# Patient Record
Sex: Male | Born: 1955 | Race: Black or African American | Hispanic: No | Smoking: Current every day smoker
Health system: Southern US, Community
[De-identification: ages and names within clinical notes are randomized; demographics above are authoritative.]

## PROBLEM LIST (undated history)

## (undated) DIAGNOSIS — F1910 Other psychoactive substance abuse, uncomplicated: Secondary | ICD-10-CM

## (undated) DIAGNOSIS — N400 Enlarged prostate without lower urinary tract symptoms: Secondary | ICD-10-CM

## (undated) DIAGNOSIS — I209 Angina pectoris, unspecified: Secondary | ICD-10-CM

## (undated) DIAGNOSIS — F209 Schizophrenia, unspecified: Secondary | ICD-10-CM

## (undated) DIAGNOSIS — R718 Other abnormality of red blood cells: Secondary | ICD-10-CM

## (undated) DIAGNOSIS — E119 Type 2 diabetes mellitus without complications: Secondary | ICD-10-CM

## (undated) DIAGNOSIS — Z7251 High risk heterosexual behavior: Secondary | ICD-10-CM

## (undated) DIAGNOSIS — I10 Essential (primary) hypertension: Secondary | ICD-10-CM

## (undated) DIAGNOSIS — I2510 Atherosclerotic heart disease of native coronary artery without angina pectoris: Secondary | ICD-10-CM

## (undated) HISTORY — DX: High risk heterosexual behavior: Z72.51

## (undated) HISTORY — PX: CIRCUMCISION: SUR203

## (undated) HISTORY — DX: Essential (primary) hypertension: I10

## (undated) HISTORY — PX: ARTHROSCOPY WITH ANTERIOR CRUCIATE LIGAMENT (ACL) REPAIR WITH ANTERIOR TIBILIAS GRAFT: SHX6503

## (undated) HISTORY — PX: PROSTATE SURGERY: SHX751

## (undated) HISTORY — DX: Other psychoactive substance abuse, uncomplicated: F19.10

## (undated) HISTORY — PX: LAMINECTOMY: SHX219

## (undated) HISTORY — DX: Other abnormality of red blood cells: R71.8

## (undated) HISTORY — DX: Schizophrenia, unspecified: F20.9

---

## 1997-10-21 ENCOUNTER — Encounter: Admission: RE | Admit: 1997-10-21 | Discharge: 1997-10-21 | Payer: Self-pay | Admitting: Internal Medicine

## 1997-10-23 ENCOUNTER — Emergency Department (HOSPITAL_COMMUNITY): Admission: EM | Admit: 1997-10-23 | Discharge: 1997-10-23 | Payer: Self-pay | Admitting: Emergency Medicine

## 1997-11-24 ENCOUNTER — Encounter: Admission: RE | Admit: 1997-11-24 | Discharge: 1997-11-24 | Payer: Self-pay | Admitting: Internal Medicine

## 1998-01-18 ENCOUNTER — Encounter: Admission: RE | Admit: 1998-01-18 | Discharge: 1998-01-18 | Payer: Self-pay | Admitting: Internal Medicine

## 1998-02-22 ENCOUNTER — Encounter: Admission: RE | Admit: 1998-02-22 | Discharge: 1998-02-22 | Payer: Self-pay | Admitting: Internal Medicine

## 1998-09-16 ENCOUNTER — Encounter: Admission: RE | Admit: 1998-09-16 | Discharge: 1998-09-16 | Payer: Self-pay | Admitting: Hematology and Oncology

## 1998-10-03 ENCOUNTER — Emergency Department (HOSPITAL_COMMUNITY): Admission: EM | Admit: 1998-10-03 | Discharge: 1998-10-03 | Payer: Self-pay | Admitting: *Deleted

## 1999-01-18 ENCOUNTER — Encounter: Admission: RE | Admit: 1999-01-18 | Discharge: 1999-01-18 | Payer: Self-pay | Admitting: *Deleted

## 1999-03-30 ENCOUNTER — Emergency Department (HOSPITAL_COMMUNITY): Admission: EM | Admit: 1999-03-30 | Discharge: 1999-03-30 | Payer: Self-pay | Admitting: Emergency Medicine

## 1999-04-20 ENCOUNTER — Encounter: Admission: RE | Admit: 1999-04-20 | Discharge: 1999-04-20 | Payer: Self-pay | Admitting: Internal Medicine

## 1999-11-16 ENCOUNTER — Encounter: Admission: RE | Admit: 1999-11-16 | Discharge: 1999-11-16 | Payer: Self-pay | Admitting: Internal Medicine

## 1999-12-09 ENCOUNTER — Encounter: Admission: RE | Admit: 1999-12-09 | Discharge: 2000-03-08 | Payer: Self-pay | Admitting: *Deleted

## 1999-12-28 ENCOUNTER — Encounter: Admission: RE | Admit: 1999-12-28 | Discharge: 1999-12-28 | Payer: Self-pay | Admitting: Internal Medicine

## 2000-02-20 ENCOUNTER — Encounter: Admission: RE | Admit: 2000-02-20 | Discharge: 2000-02-20 | Payer: Self-pay | Admitting: Internal Medicine

## 2000-05-14 ENCOUNTER — Encounter: Admission: RE | Admit: 2000-05-14 | Discharge: 2000-05-14 | Payer: Self-pay | Admitting: Hematology and Oncology

## 2000-05-21 ENCOUNTER — Encounter: Admission: RE | Admit: 2000-05-21 | Discharge: 2000-05-21 | Payer: Self-pay | Admitting: Internal Medicine

## 2000-06-15 ENCOUNTER — Encounter: Payer: Self-pay | Admitting: *Deleted

## 2000-06-15 ENCOUNTER — Emergency Department (HOSPITAL_COMMUNITY): Admission: EM | Admit: 2000-06-15 | Discharge: 2000-06-15 | Payer: Self-pay | Admitting: *Deleted

## 2000-07-27 ENCOUNTER — Encounter: Admission: RE | Admit: 2000-07-27 | Discharge: 2000-07-27 | Payer: Self-pay | Admitting: Internal Medicine

## 2000-10-31 ENCOUNTER — Encounter: Admission: RE | Admit: 2000-10-31 | Discharge: 2000-10-31 | Payer: Self-pay

## 2001-05-01 ENCOUNTER — Encounter: Admission: RE | Admit: 2001-05-01 | Discharge: 2001-05-01 | Payer: Self-pay

## 2001-08-12 ENCOUNTER — Encounter: Admission: RE | Admit: 2001-08-12 | Discharge: 2001-08-12 | Payer: Self-pay | Admitting: Internal Medicine

## 2002-01-08 ENCOUNTER — Encounter: Admission: RE | Admit: 2002-01-08 | Discharge: 2002-01-08 | Payer: Self-pay | Admitting: Internal Medicine

## 2002-04-22 ENCOUNTER — Encounter: Admission: RE | Admit: 2002-04-22 | Discharge: 2002-04-22 | Payer: Self-pay | Admitting: Internal Medicine

## 2002-05-13 ENCOUNTER — Encounter: Admission: RE | Admit: 2002-05-13 | Discharge: 2002-05-13 | Payer: Self-pay | Admitting: Internal Medicine

## 2002-08-14 ENCOUNTER — Emergency Department (HOSPITAL_COMMUNITY): Admission: EM | Admit: 2002-08-14 | Discharge: 2002-08-14 | Payer: Self-pay | Admitting: Emergency Medicine

## 2002-09-06 ENCOUNTER — Emergency Department (HOSPITAL_COMMUNITY): Admission: EM | Admit: 2002-09-06 | Discharge: 2002-09-06 | Payer: Self-pay | Admitting: Emergency Medicine

## 2002-09-25 ENCOUNTER — Ambulatory Visit (HOSPITAL_COMMUNITY): Admission: RE | Admit: 2002-09-25 | Discharge: 2002-09-25 | Payer: Self-pay | Admitting: Internal Medicine

## 2002-09-25 ENCOUNTER — Encounter: Admission: RE | Admit: 2002-09-25 | Discharge: 2002-09-25 | Payer: Self-pay | Admitting: Internal Medicine

## 2002-10-09 ENCOUNTER — Encounter: Admission: RE | Admit: 2002-10-09 | Discharge: 2002-10-09 | Payer: Self-pay | Admitting: Internal Medicine

## 2002-11-21 ENCOUNTER — Encounter: Admission: RE | Admit: 2002-11-21 | Discharge: 2002-11-21 | Payer: Self-pay | Admitting: Internal Medicine

## 2002-11-28 ENCOUNTER — Encounter: Admission: RE | Admit: 2002-11-28 | Discharge: 2002-11-28 | Payer: Self-pay | Admitting: Internal Medicine

## 2002-12-26 ENCOUNTER — Encounter: Payer: Self-pay | Admitting: Emergency Medicine

## 2002-12-26 ENCOUNTER — Emergency Department (HOSPITAL_COMMUNITY): Admission: EM | Admit: 2002-12-26 | Discharge: 2002-12-26 | Payer: Self-pay | Admitting: Emergency Medicine

## 2002-12-29 ENCOUNTER — Encounter: Admission: RE | Admit: 2002-12-29 | Discharge: 2002-12-29 | Payer: Self-pay | Admitting: Internal Medicine

## 2003-03-10 ENCOUNTER — Encounter: Admission: RE | Admit: 2003-03-10 | Discharge: 2003-03-10 | Payer: Self-pay | Admitting: Internal Medicine

## 2003-06-03 ENCOUNTER — Emergency Department (HOSPITAL_COMMUNITY): Admission: AD | Admit: 2003-06-03 | Discharge: 2003-06-03 | Payer: Self-pay | Admitting: Family Medicine

## 2003-08-26 ENCOUNTER — Emergency Department (HOSPITAL_COMMUNITY): Admission: EM | Admit: 2003-08-26 | Discharge: 2003-08-26 | Payer: Self-pay | Admitting: Family Medicine

## 2003-09-07 ENCOUNTER — Encounter: Admission: RE | Admit: 2003-09-07 | Discharge: 2003-09-07 | Payer: Self-pay | Admitting: Internal Medicine

## 2003-10-07 ENCOUNTER — Encounter: Admission: RE | Admit: 2003-10-07 | Discharge: 2003-10-07 | Payer: Self-pay | Admitting: Internal Medicine

## 2003-10-12 ENCOUNTER — Encounter: Admission: RE | Admit: 2003-10-12 | Discharge: 2003-10-12 | Payer: Self-pay | Admitting: Internal Medicine

## 2003-10-28 ENCOUNTER — Emergency Department (HOSPITAL_COMMUNITY): Admission: EM | Admit: 2003-10-28 | Discharge: 2003-10-28 | Payer: Self-pay | Admitting: Family Medicine

## 2003-11-12 ENCOUNTER — Encounter: Admission: RE | Admit: 2003-11-12 | Discharge: 2003-11-12 | Payer: Self-pay | Admitting: Internal Medicine

## 2003-12-23 ENCOUNTER — Encounter: Admission: RE | Admit: 2003-12-23 | Discharge: 2003-12-23 | Payer: Self-pay | Admitting: Internal Medicine

## 2003-12-23 ENCOUNTER — Ambulatory Visit (HOSPITAL_COMMUNITY): Admission: RE | Admit: 2003-12-23 | Discharge: 2003-12-23 | Payer: Self-pay | Admitting: Internal Medicine

## 2004-04-01 ENCOUNTER — Ambulatory Visit: Payer: Self-pay | Admitting: Internal Medicine

## 2004-05-19 ENCOUNTER — Ambulatory Visit: Payer: Self-pay | Admitting: Internal Medicine

## 2004-07-12 ENCOUNTER — Ambulatory Visit: Payer: Self-pay | Admitting: Internal Medicine

## 2004-07-19 ENCOUNTER — Ambulatory Visit: Payer: Self-pay | Admitting: Internal Medicine

## 2004-07-23 ENCOUNTER — Emergency Department (HOSPITAL_COMMUNITY): Admission: AD | Admit: 2004-07-23 | Discharge: 2004-07-23 | Payer: Self-pay | Admitting: Family Medicine

## 2004-11-05 ENCOUNTER — Encounter: Payer: Self-pay | Admitting: Internal Medicine

## 2004-11-16 ENCOUNTER — Emergency Department (HOSPITAL_COMMUNITY): Admission: EM | Admit: 2004-11-16 | Discharge: 2004-11-16 | Payer: Self-pay | Admitting: Family Medicine

## 2004-11-18 ENCOUNTER — Ambulatory Visit (HOSPITAL_COMMUNITY): Admission: RE | Admit: 2004-11-18 | Discharge: 2004-11-18 | Payer: Self-pay | Admitting: Gastroenterology

## 2005-01-02 ENCOUNTER — Ambulatory Visit: Payer: Self-pay | Admitting: Internal Medicine

## 2005-01-30 ENCOUNTER — Ambulatory Visit: Payer: Self-pay | Admitting: Internal Medicine

## 2005-04-21 ENCOUNTER — Ambulatory Visit: Payer: Self-pay | Admitting: Internal Medicine

## 2005-04-25 ENCOUNTER — Ambulatory Visit: Payer: Self-pay | Admitting: Internal Medicine

## 2005-05-02 ENCOUNTER — Ambulatory Visit: Payer: Self-pay | Admitting: Internal Medicine

## 2005-07-12 ENCOUNTER — Ambulatory Visit: Payer: Self-pay | Admitting: Internal Medicine

## 2005-08-10 ENCOUNTER — Emergency Department (HOSPITAL_COMMUNITY): Admission: EM | Admit: 2005-08-10 | Discharge: 2005-08-10 | Payer: Self-pay | Admitting: *Deleted

## 2005-08-15 ENCOUNTER — Emergency Department (HOSPITAL_COMMUNITY): Admission: EM | Admit: 2005-08-15 | Discharge: 2005-08-15 | Payer: Self-pay | Admitting: Emergency Medicine

## 2005-09-18 ENCOUNTER — Ambulatory Visit: Payer: Self-pay | Admitting: Hospitalist

## 2005-09-20 ENCOUNTER — Ambulatory Visit: Payer: Self-pay | Admitting: Hospitalist

## 2005-09-22 ENCOUNTER — Emergency Department (HOSPITAL_COMMUNITY): Admission: EM | Admit: 2005-09-22 | Discharge: 2005-09-22 | Payer: Self-pay | Admitting: Emergency Medicine

## 2006-01-01 ENCOUNTER — Emergency Department (HOSPITAL_COMMUNITY): Admission: EM | Admit: 2006-01-01 | Discharge: 2006-01-01 | Payer: Self-pay | Admitting: Family Medicine

## 2006-01-29 ENCOUNTER — Ambulatory Visit: Payer: Self-pay | Admitting: Internal Medicine

## 2006-02-06 ENCOUNTER — Ambulatory Visit: Payer: Self-pay | Admitting: Hospitalist

## 2006-02-23 ENCOUNTER — Ambulatory Visit: Payer: Self-pay | Admitting: Internal Medicine

## 2006-03-26 ENCOUNTER — Emergency Department (HOSPITAL_COMMUNITY): Admission: EM | Admit: 2006-03-26 | Discharge: 2006-03-26 | Payer: Self-pay | Admitting: Emergency Medicine

## 2006-04-24 ENCOUNTER — Ambulatory Visit: Payer: Self-pay | Admitting: Internal Medicine

## 2006-04-24 ENCOUNTER — Encounter (INDEPENDENT_AMBULATORY_CARE_PROVIDER_SITE_OTHER): Payer: Self-pay | Admitting: Internal Medicine

## 2006-04-24 LAB — CONVERTED CEMR LAB
ALT: 16 units/L (ref 0–40)
AST: 16 units/L (ref 0–37)
Albumin: 4.2 g/dL (ref 3.5–5.2)
Alkaline Phosphatase: 68 units/L (ref 39–117)
BUN: 12 mg/dL (ref 6–23)
CO2: 27 meq/L (ref 19–32)
Calcium: 9.1 mg/dL (ref 8.4–10.5)
Chloride: 105 meq/L (ref 96–112)
Creatinine, Ser: 1.2 mg/dL (ref 0.40–1.50)
Glucose, Bld: 142 mg/dL — ABNORMAL HIGH (ref 70–99)
Potassium: 4.6 meq/L (ref 3.5–5.3)
Sodium: 139 meq/L (ref 135–145)
Total Bilirubin: 0.4 mg/dL (ref 0.3–1.2)
Total Protein: 7.2 g/dL (ref 6.0–8.3)

## 2006-04-27 DIAGNOSIS — E669 Obesity, unspecified: Secondary | ICD-10-CM | POA: Insufficient documentation

## 2006-04-27 DIAGNOSIS — E1169 Type 2 diabetes mellitus with other specified complication: Secondary | ICD-10-CM | POA: Insufficient documentation

## 2006-04-27 DIAGNOSIS — I10 Essential (primary) hypertension: Secondary | ICD-10-CM | POA: Insufficient documentation

## 2006-04-27 DIAGNOSIS — F17200 Nicotine dependence, unspecified, uncomplicated: Secondary | ICD-10-CM | POA: Insufficient documentation

## 2006-05-28 ENCOUNTER — Ambulatory Visit: Payer: Self-pay | Admitting: Internal Medicine

## 2006-06-22 ENCOUNTER — Encounter (INDEPENDENT_AMBULATORY_CARE_PROVIDER_SITE_OTHER): Payer: Self-pay | Admitting: Internal Medicine

## 2006-06-22 ENCOUNTER — Ambulatory Visit: Payer: Self-pay | Admitting: Internal Medicine

## 2006-06-22 LAB — CONVERTED CEMR LAB
ALT: 10 units/L (ref 0–53)
AST: 15 units/L (ref 0–37)
Albumin: 4.1 g/dL (ref 3.5–5.2)
Alkaline Phosphatase: 71 units/L (ref 39–117)
BUN: 16 mg/dL (ref 6–23)
CO2: 22 meq/L (ref 19–32)
Calcium: 9.1 mg/dL (ref 8.4–10.5)
Chloride: 106 meq/L (ref 96–112)
Creatinine, Ser: 1.12 mg/dL (ref 0.40–1.50)
Glucose, Bld: 103 mg/dL — ABNORMAL HIGH (ref 70–99)
Potassium: 4.4 meq/L (ref 3.5–5.3)
Sodium: 140 meq/L (ref 135–145)
Total Bilirubin: 0.3 mg/dL (ref 0.3–1.2)
Total Protein: 7.2 g/dL (ref 6.0–8.3)

## 2006-06-29 ENCOUNTER — Encounter (INDEPENDENT_AMBULATORY_CARE_PROVIDER_SITE_OTHER): Payer: Self-pay | Admitting: Internal Medicine

## 2006-06-29 ENCOUNTER — Ambulatory Visit: Payer: Self-pay | Admitting: Internal Medicine

## 2006-06-29 LAB — CONVERTED CEMR LAB
BUN: 13 mg/dL (ref 6–23)
CO2: 25 meq/L (ref 19–32)
Calcium: 8.6 mg/dL (ref 8.4–10.5)
Chloride: 103 meq/L (ref 96–112)
Creatinine, Ser: 1.17 mg/dL (ref 0.40–1.50)
Glucose, Bld: 97 mg/dL (ref 70–99)
Potassium: 4.4 meq/L (ref 3.5–5.3)
Sodium: 141 meq/L (ref 135–145)

## 2006-07-13 ENCOUNTER — Ambulatory Visit: Payer: Self-pay | Admitting: Internal Medicine

## 2006-08-22 ENCOUNTER — Telehealth: Payer: Self-pay | Admitting: *Deleted

## 2006-08-28 ENCOUNTER — Ambulatory Visit: Payer: Self-pay | Admitting: Internal Medicine

## 2006-08-28 ENCOUNTER — Encounter (INDEPENDENT_AMBULATORY_CARE_PROVIDER_SITE_OTHER): Payer: Self-pay | Admitting: Internal Medicine

## 2006-08-28 DIAGNOSIS — R5383 Other fatigue: Secondary | ICD-10-CM

## 2006-08-28 DIAGNOSIS — R5381 Other malaise: Secondary | ICD-10-CM | POA: Insufficient documentation

## 2006-08-28 DIAGNOSIS — E785 Hyperlipidemia, unspecified: Secondary | ICD-10-CM | POA: Insufficient documentation

## 2006-08-28 LAB — CONVERTED CEMR LAB
ALT: 17 units/L (ref 0–53)
AST: 19 units/L (ref 0–37)
Albumin: 4.1 g/dL (ref 3.5–5.2)
Alkaline Phosphatase: 73 units/L (ref 39–117)
BUN: 13 mg/dL (ref 6–23)
Bilirubin Urine: NEGATIVE
Blood Glucose, Fingerstick: 109
Blood in Urine, dipstick: NEGATIVE
CO2: 24 meq/L (ref 19–32)
Calcium: 9.2 mg/dL (ref 8.4–10.5)
Chloride: 105 meq/L (ref 96–112)
Creatinine, Ser: 1.2 mg/dL (ref 0.40–1.50)
Glucose, Bld: 98 mg/dL (ref 70–99)
Glucose, Urine, Semiquant: NEGATIVE
Ketones, urine, test strip: NEGATIVE
Nitrite: NEGATIVE
PSA: 2.81 ng/mL (ref 0.10–4.00)
Potassium: 4.4 meq/L (ref 3.5–5.3)
Protein, U semiquant: 30
Sodium: 138 meq/L (ref 135–145)
Specific Gravity, Urine: 1.015
TSH: 1.688 microintl units/mL (ref 0.350–5.50)
Total Bilirubin: 0.3 mg/dL (ref 0.3–1.2)
Total CK: 272 units/L — ABNORMAL HIGH (ref 7–232)
Total Protein: 7.1 g/dL (ref 6.0–8.3)
Urobilinogen, UA: 0.2
WBC Urine, dipstick: NEGATIVE
pH: 5

## 2006-09-04 ENCOUNTER — Encounter (INDEPENDENT_AMBULATORY_CARE_PROVIDER_SITE_OTHER): Payer: Self-pay | Admitting: Internal Medicine

## 2006-09-04 ENCOUNTER — Ambulatory Visit: Payer: Self-pay | Admitting: Hospitalist

## 2006-09-04 LAB — CONVERTED CEMR LAB
BUN: 14 mg/dL (ref 6–23)
CO2: 23 meq/L (ref 19–32)
Calcium: 9.3 mg/dL (ref 8.4–10.5)
Chloride: 104 meq/L (ref 96–112)
Creatinine, Ser: 1.06 mg/dL (ref 0.40–1.50)
Glucose, Bld: 100 mg/dL — ABNORMAL HIGH (ref 70–99)
Potassium: 4.5 meq/L (ref 3.5–5.3)
Sodium: 138 meq/L (ref 135–145)

## 2006-10-11 ENCOUNTER — Encounter (INDEPENDENT_AMBULATORY_CARE_PROVIDER_SITE_OTHER): Payer: Self-pay | Admitting: Internal Medicine

## 2006-10-11 DIAGNOSIS — F200 Paranoid schizophrenia: Secondary | ICD-10-CM | POA: Insufficient documentation

## 2006-11-01 ENCOUNTER — Ambulatory Visit: Payer: Self-pay | Admitting: Internal Medicine

## 2006-11-01 LAB — CONVERTED CEMR LAB
Blood Glucose, Fingerstick: 135
Hgb A1c MFr Bld: 6.3 %

## 2006-11-22 ENCOUNTER — Encounter (INDEPENDENT_AMBULATORY_CARE_PROVIDER_SITE_OTHER): Payer: Self-pay | Admitting: Internal Medicine

## 2006-11-22 ENCOUNTER — Ambulatory Visit: Payer: Self-pay | Admitting: Internal Medicine

## 2006-11-22 DIAGNOSIS — R079 Chest pain, unspecified: Secondary | ICD-10-CM | POA: Insufficient documentation

## 2006-11-22 DIAGNOSIS — I2510 Atherosclerotic heart disease of native coronary artery without angina pectoris: Secondary | ICD-10-CM | POA: Insufficient documentation

## 2006-11-22 LAB — CONVERTED CEMR LAB: Blood Glucose, Fingerstick: 155

## 2006-12-04 LAB — CONVERTED CEMR LAB
BUN: 18 mg/dL (ref 6–23)
CO2: 25 meq/L (ref 19–32)
Calcium: 9.3 mg/dL (ref 8.4–10.5)
Chloride: 103 meq/L (ref 96–112)
Cholesterol: 177 mg/dL (ref 0–200)
Creatinine, Ser: 1.26 mg/dL (ref 0.40–1.50)
Glucose, Bld: 133 mg/dL — ABNORMAL HIGH (ref 70–99)
HDL: 46 mg/dL (ref >39)
LDL Cholesterol: 118 mg/dL — ABNORMAL HIGH (ref 0–99)
Potassium: 4.4 meq/L (ref 3.5–5.3)
Sodium: 141 meq/L (ref 135–145)
Total CHOL/HDL Ratio: 3.8
Triglycerides: 67 mg/dL (ref <150)
VLDL: 13 mg/dL (ref 0–40)

## 2006-12-17 ENCOUNTER — Ambulatory Visit: Payer: Self-pay | Admitting: Internal Medicine

## 2006-12-17 LAB — CONVERTED CEMR LAB: Blood Glucose, Fingerstick: 118

## 2007-01-01 ENCOUNTER — Encounter (INDEPENDENT_AMBULATORY_CARE_PROVIDER_SITE_OTHER): Payer: Self-pay | Admitting: *Deleted

## 2007-02-07 ENCOUNTER — Encounter: Admission: RE | Admit: 2007-02-07 | Discharge: 2007-05-08 | Payer: Self-pay | Admitting: Orthopedic Surgery

## 2007-03-03 ENCOUNTER — Emergency Department (HOSPITAL_COMMUNITY): Admission: EM | Admit: 2007-03-03 | Discharge: 2007-03-04 | Payer: Self-pay | Admitting: Family Medicine

## 2007-03-07 ENCOUNTER — Encounter (INDEPENDENT_AMBULATORY_CARE_PROVIDER_SITE_OTHER): Payer: Self-pay | Admitting: *Deleted

## 2007-03-07 ENCOUNTER — Ambulatory Visit: Payer: Self-pay | Admitting: Hospitalist

## 2007-03-07 LAB — CONVERTED CEMR LAB
Blood Glucose, Fingerstick: 96
Hgb A1c MFr Bld: 6.7 %

## 2007-03-13 LAB — CONVERTED CEMR LAB
Creatinine, Urine: 203.7 mg/dL
Microalb Creat Ratio: 63.3 mg/g — ABNORMAL HIGH (ref 0.0–30.0)
Microalb, Ur: 12.9 mg/dL — ABNORMAL HIGH (ref 0.00–1.89)

## 2007-06-11 ENCOUNTER — Ambulatory Visit: Payer: Self-pay | Admitting: Internal Medicine

## 2007-06-11 ENCOUNTER — Encounter (INDEPENDENT_AMBULATORY_CARE_PROVIDER_SITE_OTHER): Payer: Self-pay | Admitting: *Deleted

## 2007-06-11 LAB — CONVERTED CEMR LAB
ALT: 11 units/L (ref 0–53)
AST: 15 units/L (ref 0–37)
Albumin: 4.3 g/dL (ref 3.5–5.2)
Alkaline Phosphatase: 68 units/L (ref 39–117)
BUN: 13 mg/dL (ref 6–23)
Blood Glucose, Fingerstick: 112
CO2: 24 meq/L (ref 19–32)
Calcium: 9.3 mg/dL (ref 8.4–10.5)
Chloride: 105 meq/L (ref 96–112)
Creatinine, Ser: 1.23 mg/dL (ref 0.40–1.50)
Glucose, Bld: 85 mg/dL (ref 70–99)
Hgb A1c MFr Bld: 6.5 %
Potassium: 4.4 meq/L (ref 3.5–5.3)
Sodium: 140 meq/L (ref 135–145)
Total Bilirubin: 0.2 mg/dL — ABNORMAL LOW (ref 0.3–1.2)
Total Protein: 7.6 g/dL (ref 6.0–8.3)

## 2007-09-14 ENCOUNTER — Emergency Department (HOSPITAL_COMMUNITY): Admission: EM | Admit: 2007-09-14 | Discharge: 2007-09-14 | Payer: Self-pay | Admitting: Emergency Medicine

## 2007-10-02 DIAGNOSIS — Z7251 High risk heterosexual behavior: Secondary | ICD-10-CM

## 2007-10-02 HISTORY — DX: High risk heterosexual behavior: Z72.51

## 2007-10-25 ENCOUNTER — Ambulatory Visit: Payer: Self-pay | Admitting: Infectious Disease

## 2007-10-25 LAB — CONVERTED CEMR LAB
Blood Glucose, Fingerstick: 89
Hgb A1c MFr Bld: 6.6 %

## 2007-10-26 ENCOUNTER — Encounter: Payer: Self-pay | Admitting: Infectious Disease

## 2007-10-26 LAB — CONVERTED CEMR LAB
Chlamydia, Swab/Urine, PCR: NEGATIVE
GC Probe Amp, Urine: NEGATIVE

## 2008-04-08 ENCOUNTER — Ambulatory Visit: Payer: Self-pay | Admitting: Infectious Disease

## 2008-07-28 ENCOUNTER — Encounter: Payer: Self-pay | Admitting: Internal Medicine

## 2008-10-29 ENCOUNTER — Ambulatory Visit: Payer: Self-pay | Admitting: Infectious Disease

## 2008-10-29 ENCOUNTER — Encounter: Payer: Self-pay | Admitting: Internal Medicine

## 2008-10-29 DIAGNOSIS — R358 Other polyuria: Secondary | ICD-10-CM | POA: Insufficient documentation

## 2008-10-29 DIAGNOSIS — L659 Nonscarring hair loss, unspecified: Secondary | ICD-10-CM | POA: Insufficient documentation

## 2008-10-29 DIAGNOSIS — R3589 Other polyuria: Secondary | ICD-10-CM | POA: Insufficient documentation

## 2008-10-29 LAB — CONVERTED CEMR LAB
Blood Glucose, Fingerstick: 85
Hgb A1c MFr Bld: 6.8 %

## 2008-11-01 LAB — CONVERTED CEMR LAB
ALT: 25 units/L (ref 0–53)
AST: 88 units/L — ABNORMAL HIGH (ref 0–37)
Albumin: 4.3 g/dL (ref 3.5–5.2)
Alkaline Phosphatase: 67 units/L (ref 39–117)
BUN: 11 mg/dL (ref 6–23)
Bilirubin Urine: NEGATIVE
CO2: 24 meq/L (ref 19–32)
Calcium: 9.5 mg/dL (ref 8.4–10.5)
Chloride: 104 meq/L (ref 96–112)
Cholesterol: 211 mg/dL — ABNORMAL HIGH (ref 0–200)
Creatinine, Ser: 1.08 mg/dL (ref 0.40–1.50)
Creatinine, Urine: 39.4 mg/dL
GFR calc Af Amer: >60 mL/min (ref >60)
GFR calc non Af Amer: >60 mL/min (ref >60)
Glucose, Bld: 80 mg/dL (ref 70–99)
HCT: 47.7 % (ref 39.0–52.0)
HDL: 50 mg/dL (ref >39)
Hemoglobin, Urine: NEGATIVE
Hemoglobin: 15.9 g/dL (ref 13.0–17.0)
Ketones, ur: NEGATIVE mg/dL
LDL Cholesterol: 135 mg/dL — ABNORMAL HIGH (ref 0–99)
Leukocytes, UA: NEGATIVE
MCHC: 33.3 g/dL (ref 30.0–36.0)
MCV: 74.9 fL — ABNORMAL LOW (ref 78.0–100.0)
Microalb Creat Ratio: 86.8 mg/g — ABNORMAL HIGH (ref 0.0–30.0)
Microalb, Ur: 3.42 mg/dL — ABNORMAL HIGH (ref 0.00–1.89)
Nitrite: NEGATIVE
Platelets: 192 10*3/uL (ref 150–400)
Potassium: 4.3 meq/L (ref 3.5–5.3)
Protein, ur: NEGATIVE mg/dL
RBC: 6.37 M/uL — ABNORMAL HIGH (ref 4.22–5.81)
RDW: 15.9 % — ABNORMAL HIGH (ref 11.5–15.5)
Sodium: 139 meq/L (ref 135–145)
Specific Gravity, Urine: 1.003 — ABNORMAL LOW (ref 1.005–1.030)
TSH: 1.647 microintl units/mL (ref 0.350–4.500)
Total Bilirubin: 0.3 mg/dL (ref 0.3–1.2)
Total CHOL/HDL Ratio: 4.2
Total Protein: 7.6 g/dL (ref 6.0–8.3)
Triglycerides: 132 mg/dL (ref <150)
Urine Glucose: NEGATIVE mg/dL
Urobilinogen, UA: 0.2 (ref 0.0–1.0)
VLDL: 26 mg/dL (ref 0–40)
WBC: 10.5 10*3/uL (ref 4.0–10.5)
pH: 6 (ref 5.0–8.0)

## 2008-11-17 ENCOUNTER — Ambulatory Visit: Payer: Self-pay | Admitting: Internal Medicine

## 2008-12-21 ENCOUNTER — Ambulatory Visit: Payer: Self-pay | Admitting: Internal Medicine

## 2008-12-21 ENCOUNTER — Encounter (INDEPENDENT_AMBULATORY_CARE_PROVIDER_SITE_OTHER): Payer: Self-pay | Admitting: *Deleted

## 2008-12-21 DIAGNOSIS — G4700 Insomnia, unspecified: Secondary | ICD-10-CM | POA: Insufficient documentation

## 2008-12-21 LAB — CONVERTED CEMR LAB
ALT: 13 units/L (ref 0–53)
AST: 20 units/L (ref 0–37)
Albumin: 4.2 g/dL (ref 3.5–5.2)
Alkaline Phosphatase: 63 units/L (ref 39–117)
BUN: 14 mg/dL (ref 6–23)
Blood Glucose, Fingerstick: 118
CO2: 22 meq/L (ref 19–32)
Calcium: 10 mg/dL (ref 8.4–10.5)
Chloride: 103 meq/L (ref 96–112)
Creatinine, Ser: 1.27 mg/dL (ref 0.40–1.50)
GFR calc Af Amer: >60 mL/min (ref >60)
GFR calc non Af Amer: 59 mL/min — ABNORMAL LOW (ref >60)
Glucose, Bld: 102 mg/dL — ABNORMAL HIGH (ref 70–99)
Potassium: 4.2 meq/L (ref 3.5–5.3)
Sodium: 139 meq/L (ref 135–145)
Total Bilirubin: 0.3 mg/dL (ref 0.3–1.2)
Total Protein: 7.5 g/dL (ref 6.0–8.3)

## 2009-01-04 ENCOUNTER — Encounter: Payer: Self-pay | Admitting: Internal Medicine

## 2009-01-21 ENCOUNTER — Telehealth: Payer: Self-pay | Admitting: Internal Medicine

## 2009-03-24 ENCOUNTER — Ambulatory Visit: Payer: Self-pay | Admitting: Internal Medicine

## 2009-03-24 DIAGNOSIS — N401 Enlarged prostate with lower urinary tract symptoms: Secondary | ICD-10-CM | POA: Insufficient documentation

## 2009-03-24 DIAGNOSIS — R351 Nocturia: Secondary | ICD-10-CM

## 2009-03-24 LAB — CONVERTED CEMR LAB
Blood Glucose, Fingerstick: 135
Hgb A1c MFr Bld: 7 %

## 2009-03-30 ENCOUNTER — Ambulatory Visit: Payer: Self-pay | Admitting: Infectious Diseases

## 2009-03-31 ENCOUNTER — Encounter: Payer: Self-pay | Admitting: Internal Medicine

## 2009-03-31 LAB — CONVERTED CEMR LAB
BUN: 13 mg/dL (ref 6–23)
CO2: 23 meq/L (ref 19–32)
Calcium: 8.9 mg/dL (ref 8.4–10.5)
Chloride: 103 meq/L (ref 96–112)
Cholesterol: 160 mg/dL (ref 0–200)
Creatinine, Ser: 1.34 mg/dL (ref 0.40–1.50)
Glucose, Bld: 138 mg/dL — ABNORMAL HIGH (ref 70–99)
HDL: 41 mg/dL (ref >39)
LDL Cholesterol: 102 mg/dL — ABNORMAL HIGH (ref 0–99)
Potassium: 4.1 meq/L (ref 3.5–5.3)
Sodium: 140 meq/L (ref 135–145)
Total CHOL/HDL Ratio: 3.9
Triglycerides: 86 mg/dL (ref <150)
VLDL: 17 mg/dL (ref 0–40)

## 2009-06-30 ENCOUNTER — Encounter: Payer: Self-pay | Admitting: Internal Medicine

## 2009-07-09 ENCOUNTER — Ambulatory Visit: Payer: Self-pay | Admitting: Internal Medicine

## 2009-07-09 LAB — CONVERTED CEMR LAB
Blood Glucose, Fingerstick: 154
Hgb A1c MFr Bld: 7.1 %

## 2009-08-05 ENCOUNTER — Telehealth: Payer: Self-pay | Admitting: Internal Medicine

## 2009-08-21 ENCOUNTER — Emergency Department (HOSPITAL_COMMUNITY): Admission: EM | Admit: 2009-08-21 | Discharge: 2009-08-21 | Payer: Self-pay | Admitting: Family Medicine

## 2009-09-29 ENCOUNTER — Telehealth: Payer: Self-pay | Admitting: Internal Medicine

## 2009-11-22 ENCOUNTER — Ambulatory Visit: Payer: Self-pay | Admitting: Internal Medicine

## 2009-11-22 LAB — CONVERTED CEMR LAB
Blood Glucose, Fingerstick: 146
Hgb A1c MFr Bld: 7.1 %

## 2009-11-24 LAB — CONVERTED CEMR LAB
ALT: 13 units/L (ref 0–53)
AST: 17 units/L (ref 0–37)
Albumin: 4.6 g/dL (ref 3.5–5.2)
Alkaline Phosphatase: 63 units/L (ref 39–117)
BUN: 15 mg/dL (ref 6–23)
Basophils Absolute: 0 10*3/uL (ref 0.0–0.1)
Basophils Relative: 0 % (ref 0–1)
CO2: 27 meq/L (ref 19–32)
Calcium: 9.9 mg/dL (ref 8.4–10.5)
Chloride: 102 meq/L (ref 96–112)
Creatinine, Ser: 1.09 mg/dL (ref 0.40–1.50)
Creatinine, Urine: 35.7 mg/dL
Eosinophils Absolute: 0.2 10*3/uL (ref 0.0–0.7)
Eosinophils Relative: 2 % (ref 0–5)
Glucose, Bld: 111 mg/dL — ABNORMAL HIGH (ref 70–99)
HCT: 45.9 % (ref 39.0–52.0)
Hemoglobin: 15.3 g/dL (ref 13.0–17.0)
Lymphocytes Relative: 54 % — ABNORMAL HIGH (ref 12–46)
Lymphs Abs: 5.6 10*3/uL — ABNORMAL HIGH (ref 0.7–4.0)
MCHC: 33.3 g/dL (ref 30.0–36.0)
MCV: 74.3 fL — ABNORMAL LOW (ref >=78.0)
Microalb Creat Ratio: 25.5 mg/g (ref 0.0–30.0)
Microalb, Ur: 0.91 mg/dL (ref 0.00–1.89)
Monocytes Absolute: 0.7 10*3/uL (ref 0.1–1.0)
Monocytes Relative: 7 % (ref 3–12)
Neutro Abs: 3.9 10*3/uL (ref 1.7–7.7)
Neutrophils Relative %: 37 % — ABNORMAL LOW (ref 43–77)
PSA: 4.3 ng/mL — ABNORMAL HIGH (ref 0.10–4.00)
Platelets: 170 10*3/uL (ref 150–400)
Potassium: 3.8 meq/L (ref 3.5–5.3)
RBC: 6.18 M/uL — ABNORMAL HIGH (ref 4.22–5.81)
RDW: 16.3 % — ABNORMAL HIGH (ref 11.5–15.5)
Sodium: 139 meq/L (ref 135–145)
Total Bilirubin: 0.3 mg/dL (ref 0.3–1.2)
Total Protein: 7.6 g/dL (ref 6.0–8.3)
WBC: 10.4 10*3/uL (ref 4.0–10.5)

## 2009-11-25 ENCOUNTER — Ambulatory Visit: Payer: Self-pay | Admitting: Internal Medicine

## 2009-11-25 LAB — CONVERTED CEMR LAB
Cholesterol: 174 mg/dL (ref 0–200)
HDL: 51 mg/dL (ref >39)
LDL Cholesterol: 110 mg/dL — ABNORMAL HIGH (ref 0–99)
Total CHOL/HDL Ratio: 3.4
Triglycerides: 64 mg/dL (ref <150)
VLDL: 13 mg/dL (ref 0–40)

## 2009-11-30 ENCOUNTER — Emergency Department (HOSPITAL_COMMUNITY): Admission: EM | Admit: 2009-11-30 | Discharge: 2009-11-30 | Payer: Self-pay | Admitting: Family Medicine

## 2010-01-06 ENCOUNTER — Encounter: Payer: Self-pay | Admitting: Internal Medicine

## 2010-01-06 LAB — HM DIABETES EYE EXAM

## 2010-02-24 ENCOUNTER — Ambulatory Visit: Payer: Self-pay | Admitting: Internal Medicine

## 2010-02-24 DIAGNOSIS — R718 Other abnormality of red blood cells: Secondary | ICD-10-CM | POA: Insufficient documentation

## 2010-02-24 LAB — CONVERTED CEMR LAB
Blood Glucose, Fingerstick: 111
Hgb A1c MFr Bld: 6.7 %

## 2010-03-01 ENCOUNTER — Telehealth: Payer: Self-pay | Admitting: Internal Medicine

## 2010-04-05 ENCOUNTER — Telehealth: Payer: Self-pay | Admitting: Internal Medicine

## 2010-04-19 ENCOUNTER — Encounter: Payer: Self-pay | Admitting: Internal Medicine

## 2010-05-02 ENCOUNTER — Ambulatory Visit: Payer: Self-pay | Admitting: Internal Medicine

## 2010-05-07 ENCOUNTER — Emergency Department (HOSPITAL_COMMUNITY): Admission: EM | Admit: 2010-05-07 | Discharge: 2010-05-07 | Payer: Self-pay | Admitting: Emergency Medicine

## 2010-06-30 ENCOUNTER — Telehealth: Payer: Self-pay | Admitting: Internal Medicine

## 2010-07-07 ENCOUNTER — Ambulatory Visit: Admission: RE | Admit: 2010-07-07 | Discharge: 2010-07-07 | Payer: Self-pay | Source: Home / Self Care

## 2010-07-07 DIAGNOSIS — B351 Tinea unguium: Secondary | ICD-10-CM | POA: Insufficient documentation

## 2010-07-07 LAB — GLUCOSE, CAPILLARY: Glucose-Capillary: 112 mg/dL — ABNORMAL HIGH (ref 70–99)

## 2010-07-07 LAB — CONVERTED CEMR LAB
Blood Glucose, Fingerstick: 112
Hgb A1c MFr Bld: 6.8 %

## 2010-08-01 ENCOUNTER — Telehealth: Payer: Self-pay | Admitting: Internal Medicine

## 2010-08-04 NOTE — Letter (Signed)
Summary: [place] [facility] SUPPLIES  [place] [facility] SUPPLIES   Imported By: Shon Hough 04/21/2010 14:45:51  _____________________________________________________________________  External Attachment:    Type:   Image     Comment:   External Document

## 2010-08-04 NOTE — Assessment & Plan Note (Signed)
Summary: EST-CK/FU/MEDS/CFB   Vital Signs:  Patient profile:   55 year old male Height:      72 inches (182.88 cm) Weight:      236.5 pounds (107.50 kg) BMI:     32.19 Temp:     97.2 degrees F (36.22 degrees C) oral Pulse rate:   76 / minute BP sitting:   143 / 92  (right arm)  Vitals Entered By: Chinita Pester RN (Nov 22, 2009 1:47 PM) CC: F/U visit. Needs Rx for muscle relaxant. Is Patient Diabetic? Yes Did you bring your meter with you today? 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 Pain Assessment Patient in pain? yes     Location: back Intensity: 2 Type: aching Onset of pain  Intermittent Nutritional Status BMI of > 30 = obese CBG Result 146  Have you ever been in a relationship where you felt threatened, hurt or afraid?No   Does patient need assistance? Functional Status Self care Ambulation Normal   Primary Care Provider:  Madelaine Etienne MD  CC:  F/U visit. Needs Rx for muscle relaxant..  History of Present Illness: 55 yr old man with pmhx as described below comes to the clinic complaining of mid left back pain of 4 days duration. Worse with movement. Patient experienced it after cleaning stove. Denies any weakness, or sharp pain running down leg. Pain is localized. Denies fever or chills.    Patient complains of frequency at nighttime.   Patient complains of itchy watery eyes. Reports that this morning woke up with yellow drainage from eyes.     Depression History:      The patient denies a depressed mood most of the day and a diminished interest in his usual daily activities.         Preventive Screening-Counseling & Management  Alcohol-Tobacco     Alcohol type: beer - 2 times/week.     Smoking Status: current     Smoking Cessation Counseling: yes     Packs/Day: 2.0     Year Started: recently started back  Caffeine-Diet-Exercise     Does Patient Exercise: yes     Type of exercise: walking     Times/week: 7  Comments: Nicotine  did not work.  Problems Prior to Update: 1)  Benign Prostatic Hypertrophy, Hx of  (ICD-V13.8) 2)  Insomnia  (ICD-780.52) 3)  Hair Loss  (ICD-704.00) 4)  Polyuria  (ICD-788.42) 5)  Sexual Activity, High Risk  (ICD-V69.2) 6)  Chest Pain  (ICD-786.50) 7)  Preventive Health Care  (ICD-V70.0) 8)  Symptom, Malaise and Fatigue Nec  (ICD-780.79) 9)  Dyslipidemia  (ICD-272.4) 10)  Hypertension  (ICD-401.9) 11)  Diabetes Mellitus, Type II  (ICD-250.00) 12)  Schizophrenia  (ICD-295.90) 13)  Tobacco Abuse  (ICD-305.1) 14)  Obesity Nos  (ICD-278.00) 15)  Laminectomy, Hx of  (ICD-V45.89)  Medications Prior to Update: 1)  Fluphenazine Decanoate 25 Mg/ml Soln (Fluphenazine Decanoate) .... 1.25 Cc Im Every 2 Weeks 2)  Enalapril-Hydrochlorothiazide 5-12.5 Mg Tabs (Enalapril-Hydrochlorothiazide) .... Take 1 Tablet By Mouth Once A Day 3)  Doxazosin Mesylate 2 Mg Tabs (Doxazosin Mesylate) .... Take 1 Tablet By Mouth Once A Day 4)  Pravachol 40 Mg Tabs (Pravastatin Sodium) .... Take 1 Tablet By Mouth Once A Day  Current Medications (verified): 1)  Fluphenazine Decanoate 25 Mg/ml Soln (Fluphenazine Decanoate) .... 1.25 Cc Im Every 2 Weeks 2)  Enalapril-Hydrochlorothiazide 5-12.5 Mg Tabs (Enalapril-Hydrochlorothiazide) .... Take 1 Tablet By Mouth Once A Day 3)  Doxazosin Mesylate 2  Mg Tabs (Doxazosin Mesylate) .... Take 1 Tablet By Mouth Once A Day 4)  Pravachol 40 Mg Tabs (Pravastatin Sodium) .... Take 1 Tablet By Mouth Once A Day  Allergies: 1)  ! Lipitor 2)  ! Zocor 3)  ! Thorazine  Past History:  Past Medical History: Last updated: 10/25/2007 Hypertension Schizophrenia Laminectomy- s/p Dr. Elesa Hacker Microcytosis Tobacco abuse, ongoing High-risk sexual activity (10/2007)  Family History: Last updated: 10/29/2008 Father - still living, but not in contact.  Unknown Stepfather (73) - still living.  A deacon in church. Mother (died 4) - passed away from gangrene, DM, HTN. Sister with lupus.   Brother with sickle cell anemia.  Social History: Last updated: 06/11/2007 Smoked 1ppd for 38 years.  H/o remote cocaine use - denies present use.  Risk Factors: Exercise: yes (11/22/2009)  Risk Factors: Smoking Status: current (11/22/2009) Packs/Day: 2.0 (11/22/2009)  Family History: Reviewed history from 10/29/2008 and no changes required. Father - still living, but not in contact.  Unknown Stepfather (73) - still living.  A deacon in church. Mother (died 36) - passed away from gangrene, DM, HTN. Sister with lupus.  Brother with sickle cell anemia.  Social History: Reviewed history from 06/11/2007 and no changes required. Smoked 1ppd for 38 years.  H/o remote cocaine use - denies present use.Packs/Day:  2.0  Review of Systems  The patient denies fever, chest pain, dyspnea on exertion, peripheral edema, prolonged cough, hemoptysis, abdominal pain, melena, hematochezia, hematuria, muscle weakness, and difficulty walking.    Physical Exam  General:  NAD Eyes:  vision grossly intact, conjunctival injection, and excessive tearing.   Mouth:  MMM Neck:  supple.   Lungs:  normal respiratory effort, no intercostal retractions, no accessory muscle use, normal breath sounds, no crackles, and no wheezes.   Heart:  normal rate, regular rhythm, no murmur, no gallop, no rub, and no JVD.   Abdomen:  soft, non-tender, and normal bowel sounds.   Msk:  normal ROM. ttp left mid back.no joint tenderness, no joint swelling, no joint warmth, and no joint instability.   Extremities:  No clubbing, cyanosis, edema, or deformity noted with normal full range of motion of all joints.   Neurologic:  Nonfocal   Impression & Recommendations:  Problem # 1:  BENIGN PROSTATIC HYPERTROPHY, HX OF (ICD-V13.8) Complains of increased frequency. Increased doxazosin to 4mg  daily. Check PSA which is mildly elevated. Will recheck PSA on follow up.  If still elevated will referr for biopsy, if patient agrees.  Before repeating a borderline elevated PSA test, patient will be asked to refrain from sexual activity and bike riding for at least 48 hours.  Problem # 2:  MUSCULOSKELETAL PAIN (ICD-781.99) Most likely back pain musculoskeletal. Will place patient on flexeril and follow up.   Problem # 3:  CONJUNCTIVITIS, ALLERGIC (ICD-372.14) Will start Patanol for patient to used as prescribed and urged patient to wash hands carefully after touching face.   Problem # 4:  HYPERTENSION (ICD-401.9) Above goal. Have increased doxazosin for problem #1, which will also help decrease BP. Will recheck BP on follow up.  His updated medication list for this problem includes:    Enalapril-hydrochlorothiazide 5-12.5 Mg Tabs (Enalapril-hydrochlorothiazide) .Marland Kitchen... Take 1 tablet by mouth once a day    Doxazosin Mesylate 2 Mg Tabs (Doxazosin mesylate) .Marland Kitchen... Take 2 tablets by mouth once a day  Orders: T-CBC w/Diff ([id number])  BP today: 143/92 Prior BP: 129/88 (07/09/2009)  Labs Reviewed: K+: 4.1 (03/31/2009) Creat: : 1.34 (03/31/2009)   Chol:  160 (03/31/2009)   HDL: 41 (03/31/2009)   LDL: 102 (03/31/2009)   TG: 86 (03/31/2009)  Problem # 5:  DIABETES MELLITUS, TYPE II (ICD-250.00) HgA1c unchanged. Patient reluctant to start metformin. He was instructed that if in 3 months HgA1c increases will have to start medication. Patient in agreement.   His updated medication list for this problem includes:    Enalapril-hydrochlorothiazide 5-12.5 Mg Tabs (Enalapril-hydrochlorothiazide) .Marland Kitchen... Take 1 tablet by mouth once a day  Orders: T- Capillary Blood Glucose ([id number]) T-Hgb A1C (in-house) ([id number]) T-Urine Microalbumin w/creat. ratio [phone])  Labs Reviewed: Creat: 1.34 (03/31/2009)     Last Eye Exam: No diabetic retinopathy.    Best-corrected VA: 20/20+ OD, 20/15 OS IOP: 20 OD, 18 OS.  Done by [facility] (01/01/2007) Reviewed HgBA1c results: 7.1 (11/22/2009)  7.1  (07/09/2009)  Problem # 6:  DYSLIPIDEMIA (ICD-272.4) FLP shows LDL at 110. Above goal. Will continue same treatment for now. Recheck FLP 3-6 months and consider increasing medication if continues to be >100.   His updated medication list for this problem includes:    Pravachol 40 Mg Tabs (Pravastatin sodium) .Marland Kitchen... Take 1 tablet by mouth once a day  Future Orders: T-Lipid Profile ([id number]) ... 11/25/2009  Labs Reviewed: SGOT: 20 (12/21/2008)   SGPT: 13 (12/21/2008)   HDL:41 (03/31/2009), 50 (10/29/2008)  LDL:102 (03/31/2009), 135 (10/29/2008)  Chol:160 (03/31/2009), 211 (10/29/2008)  Trig:86 (03/31/2009), 132 (10/29/2008)  Problem # 7:  PREVENTIVE HEALTH CARE (ICD-V70.0) Patient reports that he has had Colonoscopy. There is no records in EMR but patient has records and agrees to bring records during his next visit.   Problem # 8:  TOBACCO ABUSE (ICD-305.1) Encouraged smoking cessation and discussed different methods for smoking cessation.   Complete Medication List: 1)  Fluphenazine Decanoate 25 Mg/ml Soln (Fluphenazine decanoate) .... 1.25 cc im every 2 weeks 2)  Enalapril-hydrochlorothiazide 5-12.5 Mg Tabs (Enalapril-hydrochlorothiazide) .... Take 1 tablet by mouth once a day 3)  Doxazosin Mesylate 2 Mg Tabs (Doxazosin mesylate) .... Take 2 tablets by mouth once a day 4)  Pravachol 40 Mg Tabs (Pravastatin sodium) .... Take 1 tablet by mouth once a day 5)  Patanol 0.1 % Soln (Olopatadine hcl) .Marland Kitchen.. 1 drop in eyes two times a day 6)  Flexeril 5 Mg Tabs (Cyclobenzaprine hcl) .... Take 1 tablet by mouth three times a day for 2 weeks for muscles spasm  Other Orders: T-Comprehensive Metabolic Panel ([id number]) T-PSA ([id number])  Patient Instructions: 1)  Please schedule a follow-up appointment in 3 months. 2)  Remember to increase Doxazosin 2mg  to two tablet by mouth daily. 3)  Start using Patanol eye drops. 4)  Use Flexil for muscle spasm.  5)  Tobacco is very bad for your  health and your loved ones! You Should stop smoking!. 6)  It is important that you exercise regularly at least 20 minutes 5 times a week. If you develop chest pain, have severe difficulty breathing, or feel very tired , stop exercising immediately and seek medical attention. 7)  Come in on thursday in the morning before you eat breakfast to check your cholesterol.  8)  You will be called with any abnormalities in the tests scheduled or performed today.  If you don't hear from [place] within a week from when the test was performed, you can assume that your test was normal.  Prescriptions: PRAVACHOL 40 MG TABS (PRAVASTATIN SODIUM) Take 1 tablet by mouth once a day  #30 x 3   Entered and Authorized by:  Laren Everts MD   Signed by:   Laren Everts MD on 11/22/2009   Method used:   [facility]        [facility] [address] [phone]* (retail)       [address]       [place], [place]  [address]       Ph: [phone] or [phone]       Fax: [phone]   RxID:   [id number] ENALAPRIL-HYDROCHLOROTHIAZIDE 5-12.5 MG TABS (ENALAPRIL-HYDROCHLOROTHIAZIDE) Take 1 tablet by mouth once a day  #30 x 3   Entered and Authorized by:   Laren Everts MD   Signed by:   Laren Everts MD on 11/22/2009   Method used:   [facility]        [facility] [address] [phone]* (retail)       [address]       [place], [place]  [address]       Ph: [phone] or [phone]       Fax: [phone]   RxID:   [id number] DOXAZOSIN MESYLATE 2 MG TABS (DOXAZOSIN MESYLATE) Take 2 tablets by mouth once a day  #60 x 3   Entered and Authorized by:   Laren Everts MD   Signed by:   Laren Everts MD on 11/22/2009   Method used:   [facility] to        [facility] [address] [phone]* (retail)       [address]       [place], [place]  [address]       Ph: [phone] or [phone]        Fax: [phone]   RxID:   [id number] FLEXERIL 5 MG TABS (CYCLOBENZAPRINE HCL) Take 1 tablet by mouth three times a day for 2 weeks for muscles spasm  #42 x 0   Entered and Authorized by:   Laren Everts MD   Signed by:   Laren Everts MD on 11/22/2009   Method used:   [facility]ronically to        [facility] [address] [phone]* (retail)       [address]       [place], [place]  [address]       Ph: [phone] or [phone]       Fax: [phone]   RxID:   [id number] PATANOL 0.1 % SOLN (OLOPATADINE HCL) 1 drop in eyes two times a day  #56ml x 1   Entered and Authorized by:   Laren Everts MD   Signed by:   Laren Everts MD on 11/22/2009   Method used:   [facility]ronically to        [facility] [address] [phone]* (retail)       [address]       [place], [place]  [address]       Ph: [phone] or [phone]       Fax: [phone]   RxID:   [id number]   Prevention & Chronic Care Immunizations   Influenza vaccine: Fluvax MCR  (03/24/2009)    Tetanus booster: Not documented    Pneumococcal vaccine: Not documented  Colorectal Screening   Hemoccult: Negative  (05/02/2005)    Colonoscopy: Not documented  Other Screening   PSA: 2.81  (08/28/2006)  PSA ordered.   PSA action/deferral: Discussed-PSA requested  (11/22/2009)   Smoking status: current  (11/22/2009)   Smoking cessation counseling: yes  (11/22/2009)  Diabetes Mellitus   HgbA1C: 7.1  (11/22/2009)   HgbA1C action/deferral: Ordered  (07/09/2009)    Eye exam: No diabetic retinopathy.    Best-corrected VA: 20/20+ OD, 20/15 OS IOP: 20 OD, 18 OS.  Done by [facility]  (01/01/2007)   Eye exam due: 01/2008    Foot exam: yes  (07/09/2009)   Foot exam action/deferral: Do today   High risk foot: No  (07/09/2009)   Foot care education: Done  (07/09/2009)    Urine microalbumin/creatinine ratio:  86.8  (10/29/2008)   Urine microalbumin action/deferral: Ordered    Diabetes flowsheet reviewed?: Yes   Progress toward A1C goal: Unchanged  Lipids   Total Cholesterol: 160  (03/31/2009)   Lipid panel action/deferral: Lipid Panel ordered   LDL: 102  (03/31/2009)   LDL Direct: Not documented   HDL: 41  (03/31/2009)   Triglycerides: 86  (03/31/2009)    SGOT (AST): 20  (12/21/2008)   BMP action: Ordered   SGPT (ALT): 13  (12/21/2008) CMP ordered    Alkaline phosphatase: 63  (12/21/2008)   Total bilirubin: 0.3  (12/21/2008)    Lipid flowsheet reviewed?: Yes   Progress toward LDL goal: Unchanged  Hypertension   Last Blood Pressure: 143 / 92  (11/22/2009)   Serum creatinine: 1.34  (03/31/2009)   BMP action: Ordered   Serum potassium 4.1  (03/31/2009) CMP ordered     Hypertension flowsheet reviewed?: Yes   Progress toward BP goal: Deteriorated  Self-Management Support :   Personal Goals (by the next clinic visit) :     Personal A1C goal: 6  (03/24/2009)     Personal blood pressure goal: 130/80  (03/24/2009)     Personal LDL goal: 100  (03/24/2009)    Patient will work on the following items until the next clinic visit to reach self-care goals:     Medications and monitoring: take my medicines every day, check my blood sugar, examine my feet every day  (11/22/2009)     Eating: drink diet soda or water instead of juice or soda, eat more vegetables, use fresh or frozen vegetables, eat foods that are low in salt, eat baked foods instead of fried foods, eat fruit for snacks and desserts, limit or avoid alcohol  (11/22/2009)     Activity: take a 30 minute walk every day, take the stairs instead of the elevator  (11/22/2009)     Other: looking at smoking cessation - walked 2+ miles yesterday and does so regularly  (07/09/2009)    Diabetes self-management support: Education handout, Resources for patients handout, Written self-care plan  (11/22/2009)   Diabetes care plan printed    Diabetes education handout printed    Hypertension self-management support: Education handout, Resources for patients handout, Written self-care plan  (11/22/2009)   Hypertension self-care plan printed.   Hypertension education handout printed    Lipid self-management support: Education handout, Resources for patients handout, Written self-care plan  (11/22/2009)   Lipid self-care plan printed.   Lipid education handout printed      Resource handout printed.  Process Orders Check Orders Results:     [facility]: Order checked:     [id number] -- T-PSA -- ABN required due to diagnosis (CPT: [phone]) Tests Sent for requisitioning (Nov 29, 2009 12:36 PM):     11/22/2009: [facility] --  T-Urine Microalbumin w/creat. ratio [[id number]] (signed)     11/22/2009: [facility] -- T-Comprehensive Metabolic Panel [[id number]] (signed)     11/22/2009: [facility] -- T-CBC w/Diff [[id number]] (signed)     11/22/2009: [facility] -- T-PSA [phone] (signed)     11/25/2009: [facility] -- T-Lipid Profile [phone] (signed)    Laboratory Results   Blood Tests   Date/Time Received: Nov 22, 2009 2:20 PM  Date/Time Reported: Burke Keels  Nov 22, 2009 2:20 PM   HGBA1C: 7.1%   (Normal Range: Non-Diabetic - 3-6%   Control Diabetic - 6-8%) CBG Random:: 146mg /dL

## 2010-08-04 NOTE — Consult Note (Signed)
Summary: [facility]  [facility]   Imported By: Margie Billet 01/10/2010 11:05:13  _____________________________________________________________________  External Attachment:    Type:   Image     Comment:   External Document  Appended Document: [facility] No diabetic retinopathy. Recheck Eye Exam in one year.  Appended Document: [facility]   Diabetic Eye Exam  Procedure date:  01/06/2010  Findings:      No diabetic retinopathy.     Procedures Next Due Date:    Diabetic Eye Exam: 01/2011   Diabetic Eye Exam  Procedure date:  01/06/2010  Findings:      No diabetic retinopathy.     Procedures Next Due Date:    Diabetic Eye Exam: 01/2011

## 2010-08-04 NOTE — Assessment & Plan Note (Signed)
Summary: FLU SHOT/DS  Nurse Visit   Allergies: 1)  ! Lipitor 2)  ! Zocor 3)  ! Thorazine  Immunizations Administered:  Influenza Vaccine # 1:    Vaccine Type: Fluvax MCR    Site: right deltoid    Mfr: Gl[employer]oSmithKline    Dose: 0.5 ml    Route: IM    Given by: Angelina Ok RN    Exp. Date: 12/31/2010    Lot #: [id number]    VIS given: 01/25/10 version given May 02, 2010.  Flu Vaccine Consent Questions:    Do you have a history of severe allergic reactions to this vaccine? no    Any prior history of allergic reactions to egg and/or gelatin? no    Do you have a sensitivity to the preservative Thimersol? no    Do you have a past history of Guillan-Barre Syndrome? no    Do you currently have an acute febrile illness? no    Have you ever had a severe reaction to latex? no    Vaccine information given and explained to patient? yes  Orders Added: 1)  Influenza Vaccine MCR [[id number]]

## 2010-08-04 NOTE — Progress Notes (Signed)
Summary: refill/gg  Phone Note Refill Request  on March 01, 2010 2:20 PM  Refills Requested: Medication #1:  PRAVACHOL 40 MG TABS Take 1 tablet by mouth once a day   Last Refilled: 01/24/2010  Medication #2:  ENALAPRIL-HYDROCHLOROTHIAZIDE 5-12.5 MG TABS Take 1 tablet by mouth once a day  Method Requested: Electronic Initial call taken by: Merrie Roof RN,  March 01, 2010 2:20 PM    Prescriptions: PRAVACHOL 40 MG TABS (PRAVASTATIN SODIUM) Take 1 tablet by mouth once a day  #30 x 3   Entered and Authorized by:   Laren Everts MD   Signed by:   Laren Everts MD on 03/02/2010   Method used:   [facility]ically to        [facility] [address] [phone]* (retail)       [address]       [place], [place]  [address]       Ph: [phone] or [phone]       Fax: [phone]   RxID:   [id number] ENALAPRIL-HYDROCHLOROTHIAZIDE 5-12.5 MG TABS (ENALAPRIL-HYDROCHLOROTHIAZIDE) Take 1 tablet by mouth once a day  #30 x 3   Entered and Authorized by:   Laren Everts MD   Signed by:   Laren Everts MD on 03/02/2010   Method used:   Electronically to        [facility] [address] [phone]* (retail)       [address]       [place], [place]  [address]       Ph: [phone] or [phone]       Fax: [phone]   RxID:   [id number]

## 2010-08-04 NOTE — Progress Notes (Signed)
Summary: Refill/gh  Phone Note Refill Request Message from:  Fax from Pharmacy on June 30, 2010 9:24 AM  Refills Requested: Medication #1:  PRAVACHOL 40 MG TABS Take 1 tablet by mouth once a day   Last Refilled: 06/01/2010 Last office visit was 02/24/2010. last labs were 10/2009   Method Requested: Electronic Initial call taken by: Angelina Ok RN,  June 30, 2010 9:24 AM  Follow-up for Phone Call        Refill approved-nurse to complete Follow-up by: Julaine Fusi  DO,  June 30, 2010 9:33 AM    Prescriptions: PRAVACHOL 40 MG TABS (PRAVASTATIN SODIUM) Take 1 tablet by mouth once a day  #30 x 3   Entered and Authorized by:   Julaine Fusi  DO   Signed by:   Julaine Fusi  DO on 06/30/2010   Method used:   Electronically to        [facility] [address] [phone]* (retail)       [address]       [place], [place]  [address]       Ph: [phone] or [phone]       Fax: [phone]   RxID:   [id number]

## 2010-08-04 NOTE — Assessment & Plan Note (Signed)
Summary: CHECKUP/SB.   Vital Signs:  Patient profile:   55 year old male Height:      72 inches (182.88 cm) Weight:      236.03 pounds (107.29 kg) BMI:     32.13 Temp:     97.4 degrees F (36.33 degrees C) oral Pulse rate:   93 / minute BP sitting:   126 / 82  (right arm)  Vitals Entered By: Angelina Ok RN (July 07, 2010 1:21 PM) CC: CHECK UP Is Patient Diabetic? No Pain Assessment Patient in pain? yes     Location: lower back Intensity: 2 Type: aching Onset of pain  Chronic Nutritional Status BMI of > 30 = obese CBG Result 112  Does patient need assistance? Functional Status Self care Ambulation Normal   Diabetic Foot Exam Last Podiatry Exam Date: 12/21/2008 Foot Inspection Is there a history of a foot ulcer?              No Is there a foot ulcer now?              No Can the patient see the bottom of their feet?          Yes Are the shoes appropriate in style and fit?          Yes Is there swelling or an abnormal foot shape?          No Are the toenails long?                Yes Are the toenails thick?                Yes Are the toenails ingrown?              No Is there heavy callous build-up?              Yes Is there pain in the calf muscle (Intermittent claudication) when walking?    NoIs there a claw toe deformity?              No Is there elevated skin temperature?            No Is there limited ankle dorsiflexion?            No Is there foot or ankle muscle weakness?            No  Diabetic Foot Care Education Pulse Check          Right Foot          Left Foot Posterior Tibial:        normal            normal Dorsalis Pedis:        normal            normal Comments: Skin breakdown between  toes.  Callus build up on heels.  Skin peeling on bottom of right foot.  Peeling between toes. Slight bunion on side of left foot.  Fungus in toe nails great toes on both feet.    High Risk Feet? Yes   10-g (5.07) Semmes-Weinstein Monofilament Test Performed by:  Angelina Ok RN          Right Foot          Left Foot Visual Inspection               Test Control      normal         normal Site 1  normal         normal Site 2         normal         normal Site 3         normal         normal Site 4         normal         normal Site 5         normal         normal Site 6         normal         normal Site 7         normal         normal Site 8         normal         normal Site 9         normal         normal Site 10         normal         normal  Impression      normal         normal   Primary Care Provider:  Madelaine Etienne MD  CC:  CHECK UP.  History of Present Illness: 55 yr old man with pmhx as described below comes to the clinic for regular check up. Patient has no complians. Denies chest pain, palpitations, diaphoresis, shortness of breath.  Preventive Screening-Counseling & Management  Alcohol-Tobacco     Alcohol type: beer - 2 times/week.     Smoking Status: current     Smoking Cessation Counseling: yes     Packs/Day: 2.0     Year Started: recently started back  Caffeine-Diet-Exercise     Does Patient Exercise: yes     Type of exercise: walking     Times/week: 7  Problems Prior to Update: 1)  Need Prophylactic Vaccination&inoculation Flu  (ICD-V04.81) 2)  Microcytosis  (ICD-790.09) 3)  Benign Prostatic Hypertrophy, Hx of  (ICD-V13.8) 4)  Insomnia  (ICD-780.52) 5)  Hair Loss  (ICD-704.00) 6)  Polyuria  (ICD-788.42) 7)  Sexual Activity, High Risk  (ICD-V69.2) 8)  Chest Pain  (ICD-786.50) 9)  Preventive Health Care  (ICD-V70.0) 10)  Symptom, Malaise and Fatigue Nec  (ICD-780.79) 11)  Dyslipidemia  (ICD-272.4) 12)  Hypertension  (ICD-401.9) 13)  Diabetes Mellitus, Type II  (ICD-250.00) 14)  Schizophrenia  (ICD-295.90) 15)  Tobacco Abuse  (ICD-305.1) 16)  Obesity Nos  (ICD-278.00) 17)  Laminectomy, Hx of  (ICD-V45.89)  Medications Prior to Update: 1)  Fluphenazine Decanoate 25 Mg/ml Soln (Fluphenazine Decanoate)  .... 1.25 Cc Im Every 2 Weeks 2)  Enalapril-Hydrochlorothiazide 5-12.5 Mg Tabs (Enalapril-Hydrochlorothiazide) .... Take 1 Tablet By Mouth Once A Day 3)  Doxazosin Mesylate 2 Mg Tabs (Doxazosin Mesylate) .... Take 2 Tablets By Mouth Once A Day 4)  Pravachol 40 Mg Tabs (Pravastatin Sodium) .... Take 1 Tablet By Mouth Once A Day 5)  Patanol 0.1 % Soln (Olopatadine Hcl) .Marland Kitchen.. 1 Drop in Eyes Two Times A Day 6)  Flexeril 5 Mg Tabs (Cyclobenzaprine Hcl) .... Take 1 Tablet By Mouth Three Times A Day For 2 Weeks For Muscles Spasm  Current Medications (verified): 1)  Fluphenazine Decanoate 25 Mg/ml Soln (Fluphenazine Decanoate) .... 1.25 Cc Im Every 2 Weeks 2)  Enalapril-Hydrochlorothiazide 5-12.5 Mg Tabs (Enalapril-Hydrochlorothiazide) .... Take 1 Tablet By Mouth Once A Day 3)  Doxazosin Mesylate 2 Mg Tabs (Doxazosin Mesylate) .Marland KitchenMarland KitchenMarland Kitchen  Take 2 Tablets By Mouth Once A Day 4)  Pravachol 40 Mg Tabs (Pravastatin Sodium) .... Take 1 Tablet By Mouth Once A Day 5)  Patanol 0.1 % Soln (Olopatadine Hcl) .Marland Kitchen.. 1 Drop in Eyes Two Times A Day 6)  Flexeril 5 Mg Tabs (Cyclobenzaprine Hcl) .... Take 1 Tablet By Mouth Three Times A Day For 2 Weeks For Muscles Spasm 7)  Ciclopirox 8 % Soln (Ciclopirox) .... Apply To Nail At Bedtime 8)  Clotrimazole 1 % Crea (Clotrimazole) .... Apply To Between Toes Two Times A Day  Allergies: 1)  ! Lipitor 2)  ! Zocor 3)  ! Thorazine  Past History:  Past Medical History: Last updated: 10/25/2007 Hypertension Schizophrenia Laminectomy- s/p Dr. Elesa Hacker Microcytosis Tobacco abuse, ongoing High-risk sexual activity (10/2007)  Family History: Last updated: 10/29/2008 Father - still living, but not in contact.  Unknown Stepfather (73) - still living.  A deacon in church. Mother (died 51) - passed away from gangrene, DM, HTN. Sister with lupus.  Brother with sickle cell anemia.  Social History: Last updated: 06/11/2007 Smoked 1ppd for 38 years.  H/o remote cocaine use - denies  present use.  Risk Factors: Exercise: yes (07/07/2010)  Risk Factors: Smoking Status: current (07/07/2010) Packs/Day: 2.0 (07/07/2010)  Family History: Reviewed history from 10/29/2008 and no changes required. Father - still living, but not in contact.  Unknown Stepfather (73) - still living.  A deacon in church. Mother (died 71) - passed away from gangrene, DM, HTN. Sister with lupus.  Brother with sickle cell anemia.  Social History: Reviewed history from 06/11/2007 and no changes required. Smoked 1ppd for 38 years.  H/o remote cocaine use - denies present use.  Review of Systems  The patient denies fever, chest pain, syncope, dyspnea on exertion, peripheral edema, prolonged cough, headaches, hemoptysis, abdominal pain, melena, hematochezia, severe indigestion/heartburn, hematuria, muscle weakness, difficulty walking, and unusual weight change.    Physical Exam  General:  NAD, vitas reviewed Mouth:  MMM Neck:  supple.   Lungs:  normal respiratory effort, no intercostal retractions, no accessory muscle use, normal breath sounds, no crackles, and no wheezes.   Heart:  normal rate, regular rhythm, no murmur, no gallop, no rub, and no JVD.   Abdomen:  soft, non-tender, and normal bowel sounds.   Msk:  normal ROM Extremities:  No clubbing, cyanosis, edema  Neurologic:  alert & oriented X3, cranial nerves II-XII intact, strength normal in all extremities, and gait normal.   Skin:  tinea pedis, thick toe nails  Diabetes Management Exam:    Foot Exam (with socks and/or shoes not present):       Sensory-Monofilament:          Left foot: normal          Right foot: normal   Impression & Recommendations:  Problem # 1:  HYPERTENSION (ICD-401.9) At goal. Continue current regimen.  His updated medication list for this problem includes:    Enalapril-hydrochlorothiazide 5-12.5 Mg Tabs (Enalapril-hydrochlorothiazide) .Marland Kitchen... Take 1 tablet by mouth once a day    Doxazosin Mesylate  2 Mg Tabs (Doxazosin mesylate) .Marland Kitchen... Take 2 tablets by mouth once a day  BP today: 126/82 Prior BP: 130/86 (02/24/2010)  Labs Reviewed: K+: 3.8 (11/22/2009) Creat: : 1.09 (11/22/2009)   Chol: 174 (11/25/2009)   HDL: 51 (11/25/2009)   LDL: 110 (11/25/2009)   TG: 64 (11/25/2009)  Problem # 2:  DIABETES MELLITUS, TYPE II (ICD-250.00) Good control on diet. Foot exam done today. Patient high risk.  Will referr to Podiatrist for foot care.  His updated medication list for this problem includes:    Enalapril-hydrochlorothiazide 5-12.5 Mg Tabs (Enalapril-hydrochlorothiazide) .Marland Kitchen... Take 1 tablet by mouth once a day  Orders: T- Capillary Blood Glucose (62130) T-Hgb A1C (in-house) (86578IO) Podiatry Referral (Podiatry)  Labs Reviewed: Creat: 1.09 (11/22/2009)     Last Eye Exam: No diabetic retinopathy.    (01/06/2010) Reviewed HgBA1c results: 6.8 (07/07/2010)  6.7 (02/24/2010)  Problem # 3:  DYSLIPIDEMIA (ICD-272.4) Not at goal <100. Will recheck FLP and cmet. Will consider changing statin once labs reviewed if continues to be above goal.  His updated medication list for this problem includes:    Pravachol 40 Mg Tabs (Pravastatin sodium) .Marland Kitchen... Take 1 tablet by mouth once a day  Future Orders: T-CBC No Diff ([id number]) ... 07/08/2010 T-CMP with Estimated GFR ([id number]) ... 07/08/2010 T-Lipid Profile [phone]) ... 07/08/2010  Labs Reviewed: SGOT: 17 (11/22/2009)   SGPT: 13 (11/22/2009)   HDL:51 (11/25/2009), 41 (03/31/2009)  LDL:110 (11/25/2009), 102 (03/31/2009)  Chol:174 (11/25/2009), 160 (03/31/2009)  Trig:64 (11/25/2009), 86 (03/31/2009)  Problem # 4:  MICROCYTOSIS (ICD-790.09) Patient did bring evidence of Colonoscopy in 2008 which was normal. May be 2/2 thalassemia, but will check Ferritin.  Future Orders: T-Ferritin ([id number]) ... 07/08/2010  Problem # 5:  ONYCHOMYCOSIS, TOENAILS (ICD-110.1) Started patient on antifungals. Recheck response on follow up.  His  updated medication list for this problem includes:    Ciclopirox 8 % Soln (Ciclopirox) .Marland Kitchen... Apply to nail at bedtime    Clotrimazole 1 % Crea (Clotrimazole) .Marland Kitchen... Apply to between toes two times a day  Complete Medication List: 1)  Fluphenazine Decanoate 25 Mg/ml Soln (Fluphenazine decanoate) .... 1.25 cc im every 2 weeks 2)  Enalapril-hydrochlorothiazide 5-12.5 Mg Tabs (Enalapril-hydrochlorothiazide) .... Take 1 tablet by mouth once a day 3)  Doxazosin Mesylate 2 Mg Tabs (Doxazosin mesylate) .... Take 2 tablets by mouth once a day 4)  Pravachol 40 Mg Tabs (Pravastatin sodium) .... Take 1 tablet by mouth once a day 5)  Patanol 0.1 % Soln (Olopatadine hcl) .Marland Kitchen.. 1 drop in eyes two times a day 6)  Flexeril 5 Mg Tabs (Cyclobenzaprine hcl) .... Take 1 tablet by mouth three times a day for 2 weeks for muscles spasm 7)  Ciclopirox 8 % Soln (Ciclopirox) .... Apply to nail at bedtime 8)  Clotrimazole 1 % Crea (Clotrimazole) .... Apply to between toes two times a day  Patient Instructions: 1)  Please schedule a follow-up appointment in 3 months. 2)  Continue to take all medication as  3)  Return to the clinic fasting for blood work. 4)  You will be called with any abnormalities in the tests scheduled or performed today.  If you don't hear from [place] within a week from when the test was performed, you can assume that your test was normal.  Prescriptions: CLOTRIMAZOLE 1 % CREA (CLOTRIMAZOLE) Apply to between toes two times a day  #15g x 1   Entered and Authorized by:   Laren Everts MD   Signed by:   Laren Everts MD on 07/07/2010   Method used:   Electronically to        [facility] [address] [phone]* (retail)       [address]       [place], [place]  [address]       Ph: [phone] or [phone]       Fax: [phone]   RxID:  [id number] CICLOPIROX 8 % SOLN (CICLOPIROX) Apply to nail at bedtime  #1 bottle x 1   Entered and Authorized by:   Laren Everts MD   Signed by:   Laren Everts MD on 07/07/2010   Method used:   [facility]ronically to        [facility] [address] [phone]* (retail)       [address]       [place], [place]  [address]       Ph: [phone] or [phone]       Fax: [phone]   RxID:   [id number]    Orders Added: 1)  T- Capillary Blood Glucose [[id number]] 2)  T-Hgb A1C (in-house) [[id number]] 3)  T-CBC No Diff [[id number]] 4)  T-CMP with Estimated GFR [[id number]] 5)  T-Lipid Profile [[id number]] 6)  T-Ferritin [[id number]] 7)  Podiatry Referral [Podiatry] 8)  Est. Patient Level III [01093]    Prevention & Chronic Care Immunizations   Influenza vaccine: Fluvax MCR  (05/02/2010)    Tetanus booster: Not documented    Pneumococcal vaccine: Not documented  Colorectal Screening   Hemoccult: Negative  (05/02/2005)    Colonoscopy: Not documented  Other Screening   PSA: 4.30  (11/22/2009)   PSA action/deferral: Discussed-PSA requested  (11/22/2009)   Smoking status: current  (07/07/2010)   Smoking cessation counseling: yes  (07/07/2010)  Diabetes Mellitus   HgbA1C: 6.8  (07/07/2010)   HgbA1C action/deferral: Ordered  (02/24/2010)    Eye exam: No diabetic retinopathy.     (01/06/2010)   Eye exam due: 01/2011    Foot exam: yes  (07/07/2010)   Foot exam action/deferral: Do today   High risk foot: Yes  (07/07/2010)   Foot care education: Done  (07/09/2009)    Urine microalbumin/creatinine ratio: 25.5  (11/22/2009)   Urine microalbumin action/deferral: Ordered    Diabetes flowsheet reviewed?: Yes   Progress toward A1C goal: At goal  Lipids   Total Cholesterol: 174  (11/25/2009)   Lipid panel action/deferral: Lipid Panel ordered   LDL: 110  (11/25/2009)   LDL Direct: Not documented   HDL: 51  (11/25/2009)   Triglycerides: 64  (11/25/2009)    SGOT (AST): 17  (11/22/2009)   BMP action: Ordered   SGPT (ALT): 13  (11/22/2009)   Alkaline  phosphatase: 63  (11/22/2009)   Total bilirubin: 0.3  (11/22/2009)    Lipid flowsheet reviewed?: Yes   Progress toward LDL goal: At goal  Hypertension   Last Blood Pressure: 126 / 82  (07/07/2010)   Serum creatinine: 1.09  (11/22/2009)   BMP action: Ordered   Serum potassium 3.8  (11/22/2009)    Hypertension flowsheet reviewed?: Yes   Progress toward BP goal: At goal  Self-Management Support :   Personal Goals (by the next clinic visit) :     Personal A1C goal: 6  (03/24/2009)     Personal blood pressure goal: 130/80  (03/24/2009)     Personal LDL goal: 100  (03/24/2009)    Patient will work on the following items until the next clinic visit to reach self-care goals:     Medications and monitoring: take my medicines every day, check my blood sugar, examine my feet every day  (07/07/2010)     Eating: drink diet soda or water instead of juice or soda, eat more vegetables, use fresh or frozen vegetables, eat foods that are low in salt, eat baked foods instead of fried foods, eat fruit  for snacks and desserts  (07/07/2010)     Activity: take a 30 minute walk every day  (07/07/2010)     Other: looking at smoking cessation - walked 2+ miles yesterday and does so regularly  (07/09/2009)    Diabetes self-management support: Resources for patients handout  (07/07/2010)    Hypertension self-management support: Resources for patients handout  (07/07/2010)    Lipid self-management support: Resources for patients handout  (07/07/2010)        Resource handout printed.   Nursing Instructions: Diabetic foot exam today    Process Orders Check Orders Results:     [facility]: Check successful Tests Sent for requisitioning (July 08, 2010 12:15 PM):     07/08/2010: [facility] -- T-CBC No Diff [[id number]] (signed)     07/08/2010: [facility] -- T-CMP with Estimated GFR [[id number]] (signed)     07/08/2010: [facility] --  T-Lipid Profile [phone] (signed)     07/08/2010: [facility] -- T-Ferritin [[id number]] (signed)    Laboratory Results   Blood Tests   Date/Time Received: July 07, 2010 1:39 PM Date/Time Reported: Alric Quan  July 07, 2010 1:39 PM  HGBA1C: 6.8%   (Normal Range: Non-Diabetic - 3-6%   Control Diabetic - 6-8%) CBG Random:: 112mg /dL       Last LDL:                                                 110 (11/25/2009 7:28:00 PM)        Diabetic Foot Exam Last Podiatry Exam Date: 12/21/2008 Pulse Check          Right Foot          Left Foot Posterior Tibial:        normal            normal Dorsalis Pedis:        normal            normal  High Risk Feet? Yes   10-g (5.07) Semmes-Weinstein Monofilament Test Performed by: Angelina Ok RN          Right Foot          Left Foot Visual Inspection

## 2010-08-04 NOTE — Miscellaneous (Signed)
Summary: [employer] [employer] [facility] [facility]  [facility] [facility]   Imported By: Florinda Marker 07/08/2009 15:48:24  _____________________________________________________________________  External Attachment:    Type:   Image     Comment:   External Document

## 2010-08-04 NOTE — Assessment & Plan Note (Signed)
Summary: f/u [mkj]   Vital Signs:  Patient profile:   55 year old male Height:      72 inches (182.88 cm) Weight:      243.0 pounds (110.45 kg) BMI:     33.08 Temp:     97.0 degrees F Pulse rate:   102 / minute BP sitting:   129 / 88  (right arm) Cuff size:   large  Vitals Entered By: Dorie Rank RN (July 09, 2009 10:07 AM) CC: check up - needs cholesterol level checked - has had coffee with Aspertane only Is Patient Diabetic? Yes Did you bring your meter with you today? No Pain Assessment Patient in pain? no      Nutritional Status BMI of > 30 = obese CBG Result 154  Have you ever been in a relationship where you felt threatened, hurt or afraid?No   Does patient need assistance? Functional Status Self care Ambulation Normal   Diabetic Foot Exam Last Podiatry Exam Date: 12/21/2008 Foot Inspection Is there a history of a foot ulcer?              No Is there a foot ulcer now?              No Is there swelling or an abnormal foot shape?          No Are the toenails long?                Yes Are the toenails thick?                Yes Are the toenails ingrown?              No Is there heavy callous build-up?              No Is there pain in the calf muscle (Intermittent claudication) when walking?    NoIs there a claw toe deformity?              No Is there elevated skin temperature?            No Is there limited ankle dorsiflexion?            No Is there foot or ankle muscle weakness?            No  Diabetic Foot Care Education Patient educated on appropriate care of diabetic feet.  Comments: dry skin but no cracked skin areas - nails have growth under nails most toes High Risk Feet? No   10-g (5.07) Semmes-Weinstein Monofilament Test Performed by: Dorie Rank RN          Right Foot          Left Foot Visual Inspection     normal           normal Site 1         normal         normal Site 2         normal         normal Site 3         normal          normal Site 4         normal         normal Site 5         normal         normal Site 6         normal  normal Site 9         normal         normal  Impression      normal         normal  Legend:  Site 1 = Plantar aspect of first toe (center of pad) Site 2 = Plantar aspect of third toe (center of pad) Site 3 = Plantar aspect of fifth toe (center of pad) Site 4 = Plantar aspect of first metatarsal head Site 5 = Plantar aspect of third metatarsal head Site 6 = Plantar aspect of fifth metatarsal head Site 7 = Plantar aspect of medial midfoot Site 8 = Plantar aspect of lateral midfoot Site 9 = Plantar aspect of heel Site 10 = dorsal aspect of foot between the base of the first and second toes   Result is Abnormal if patient was unable to perceive the monofilament at site indicated.    Primary Care Provider:  Madelaine Etienne MD  CC:  check up - needs cholesterol level checked - has had coffee with Aspertane only.  History of Present Illness: 55 yr old man with pmhx as described below comes to the clinic for follow up. Patient states to be getting adequate sleep although he did not take [place].   Reports that he has been getting fluphenazine shots in the buttocks region recently which sometimes produces soreness.   Patient reports that he had a Diabetic Eye exam by Dr. Dawayne Patricia in july of 2010.  Preventive Screening-Counseling & Management  Alcohol-Tobacco     Smoking Status: current     Smoking Cessation Counseling: yes     Packs/Day: 1.5  Comments: states psych med changes will power - would like to decreae to maybe 1/2 pack a day  Problems Prior to Update: 1)  Benign Prostatic Hypertrophy, Hx of  (ICD-V13.8) 2)  Insomnia  (ICD-780.52) 3)  Hair Loss  (ICD-704.00) 4)  Polyuria  (ICD-788.42) 5)  Sexual Activity, High Risk  (ICD-V69.2) 6)  Chest Pain  (ICD-786.50) 7)  Preventive Health Care  (ICD-V70.0) 8)  Symptom, Malaise and Fatigue Nec  (ICD-780.79) 9)   Dyslipidemia  (ICD-272.4) 10)  Hypertension  (ICD-401.9) 11)  Diabetes Mellitus, Type II  (ICD-250.00) 12)  Schizophrenia  (ICD-295.90) 13)  Tobacco Abuse  (ICD-305.1) 14)  Obesity Nos  (ICD-278.00) 15)  Laminectomy, Hx of  (ICD-V45.89)  Medications Prior to Update: 1)  Fluphenazine Decanoate 25 Mg/ml Soln (Fluphenazine Decanoate) .... 1.25 Cc Im Every 2 Weeks 2)  Enalapril-Hydrochlorothiazide 5-12.5 Mg Tabs (Enalapril-Hydrochlorothiazide) .... Take 1 Tablet By Mouth Once A Day 3)  Doxazosin Mesylate 2 Mg Tabs (Doxazosin Mesylate) .... Take 1 Tablet By Mouth Once A Day 4)  Pravachol 40 Mg Tabs (Pravastatin Sodium) .... Take 1 Tablet By Mouth Once A Day 5)  Ambien 5 Mg Tabs (Zolpidem Tartrate) .... Take 1 Tablet By Mouth At Bedtime  Current Medications (verified): 1)  Fluphenazine Decanoate 25 Mg/ml Soln (Fluphenazine Decanoate) .... 1.25 Cc Im Every 2 Weeks 2)  Enalapril-Hydrochlorothiazide 5-12.5 Mg Tabs (Enalapril-Hydrochlorothiazide) .... Take 1 Tablet By Mouth Once A Day 3)  Doxazosin Mesylate 2 Mg Tabs (Doxazosin Mesylate) .... Take 1 Tablet By Mouth Once A Day 4)  Pravachol 40 Mg Tabs (Pravastatin Sodium) .... Take 1 Tablet By Mouth Once A Day  Allergies: 1)  ! Lipitor 2)  ! Zocor 3)  ! Thorazine  Past History:  Past Medical History: Last updated: 10/25/2007 Hypertension Schizophrenia Laminectomy- s/p Dr. Elesa Hacker Microcytosis Tobacco abuse, ongoing High-risk sexual activity (  10/2007)  Family History: Last updated: 10/29/2008 Father - still living, but not in contact.  Unknown Stepfather (73) - still living.  A deacon in church. Mother (died 6) - passed away from gangrene, DM, HTN. Sister with lupus.  Brother with sickle cell anemia.  Social History: Last updated: 06/11/2007 Smoked 1ppd for 38 years.  H/o remote cocaine use - denies present use.  Risk Factors: Exercise: yes (03/24/2009)  Risk Factors: Smoking Status: current (07/09/2009) Packs/Day: 1.5  (07/09/2009)  Family History: Reviewed history from 10/29/2008 and no changes required. Father - still living, but not in contact.  Unknown Stepfather (73) - still living.  A deacon in church. Mother (died 82) - passed away from gangrene, DM, HTN. Sister with lupus.  Brother with sickle cell anemia.  Social History: Reviewed history from 06/11/2007 and no changes required. Smoked 1ppd for 38 years.  H/o remote cocaine use - denies present use.  Review of Systems  The patient denies fever, chest pain, peripheral edema, prolonged cough, headaches, hemoptysis, abdominal pain, melena, hematochezia, hematuria, muscle weakness, and difficulty walking.    Physical Exam  General:  alert, well-developed, well-nourished, and well-hydrated.   Eyes:  vision grossly intact.   Mouth:  Dry mucous membranes, poor dentition and teeth missing.   Neck:  supple.   Lungs:  normal respiratory effort, no intercostal retractions, no accessory muscle use, normal breath sounds, no crackles, and no wheezes.   Heart:  normal rate, regular rhythm, no murmur, no gallop, no rub, and no JVD.   Abdomen:  soft, non-tender, and normal bowel sounds.   Msk:  normal ROM.   Pulses:  2+ and symmetric bilaterally Extremities:  No clubbing, cyanosis, edema, or deformity noted with normal full range of motion of all joints.   Neurologic:  Nonfocal Psych:  normally interactive.    Diabetes Management Exam:    Foot Exam (with socks and/or shoes not present):       Sensory-Monofilament:          Left foot: normal          Right foot: normal   Impression & Recommendations:  Problem # 1:  INSOMNIA (ICD-780.52) Resolved.  The following medications were removed from the medication list:    Ambien 5 Mg Tabs (Zolpidem tartrate) .Marland Kitchen... Take 1 tablet by mouth at bedtime  Problem # 2:  HYPERTENSION (ICD-401.9) Controlled. Continue current regimen.  His updated medication list for this problem includes:     Enalapril-hydrochlorothiazide 5-12.5 Mg Tabs (Enalapril-hydrochlorothiazide) .Marland Kitchen... Take 1 tablet by mouth once a day    Doxazosin Mesylate 2 Mg Tabs (Doxazosin mesylate) .Marland Kitchen... Take 1 tablet by mouth once a day  BP today: 129/88 Prior BP: 112/82 (03/24/2009)  Labs Reviewed: K+: 4.1 (03/31/2009) Creat: : 1.34 (03/31/2009)   Chol: 160 (03/31/2009)   HDL: 41 (03/31/2009)   LDL: 102 (03/31/2009)   TG: 86 (03/31/2009)  Problem # 3:  DIABETES MELLITUS, TYPE II (ICD-250.00) Slightly increased HgA1c. Patient controlling with diet and exercised. Instructed that on follow up if HgA1c continues to creep up, starting an oral medication would be indicated. Patient reluctant to start medication but understands that it may be needed. Refused podiatrist referral. Patient will follow up for Diabetic Eye exam in July of this year.  His updated medication list for this problem includes:    Enalapril-hydrochlorothiazide 5-12.5 Mg Tabs (Enalapril-hydrochlorothiazide) .Marland Kitchen... Take 1 tablet by mouth once a day  Orders: T-Hgb A1C (in-house) (98119JY) T- Capillary Blood Glucose (78295)  Labs Reviewed:  Creat: 1.34 (03/31/2009)     Last Eye Exam: No diabetic retinopathy.    Best-corrected VA: 20/20+ OD, 20/15 OS IOP: 20 OD, 18 OS.  Done by [facility] (01/01/2007) Reviewed HgBA1c results: 7.1 (07/09/2009)  7.0 (03/24/2009)  Problem # 4:  DYSLIPIDEMIA (ICD-272.4) Almost at goal. Will recheck FLP in 3 months and reasses.  His updated medication list for this problem includes:    Pravachol 40 Mg Tabs (Pravastatin sodium) .Marland Kitchen... Take 1 tablet by mouth once a day  Labs Reviewed: SGOT: 20 (12/21/2008)   SGPT: 13 (12/21/2008)   HDL:41 (03/31/2009), 50 (10/29/2008)  LDL:102 (03/31/2009), 135 (10/29/2008)  Chol:160 (03/31/2009), 211 (10/29/2008)  Trig:86 (03/31/2009), 132 (10/29/2008)  Problem # 5:  SCHIZOPHRENIA (ICD-295.90) Patient is interested in changing from shots to oral  medication. No signs of infection on area where he is getting shots to explain soreness. Most likely muscular skeletal pain. He will follow up with Psychiatrist in the up coming months.  Complete Medication List: 1)  Fluphenazine Decanoate 25 Mg/ml Soln (Fluphenazine decanoate) .... 1.25 cc im every 2 weeks 2)  Enalapril-hydrochlorothiazide 5-12.5 Mg Tabs (Enalapril-hydrochlorothiazide) .... Take 1 tablet by mouth once a day 3)  Doxazosin Mesylate 2 Mg Tabs (Doxazosin mesylate) .... Take 1 tablet by mouth once a day 4)  Pravachol 40 Mg Tabs (Pravastatin sodium) .... Take 1 tablet by mouth once a day  Patient Instructions: 1)  Please schedule a follow-up appointment in 3 months. 2)  Continue taking all medication as directed. 3)  It is important that you exercise regularly at least 20 minutes 5 times a week. If you develop chest pain, have severe difficulty breathing, or feel very tired , stop exercising immediately and seek medical attention. 4)  Consider a lower calorie diet.  5)  Check your feet each night for sore areas, calluses or signs of infection.  Prevention & Chronic Care Immunizations   Influenza vaccine: Fluvax MCR  (03/24/2009)    Tetanus booster: Not documented    Pneumococcal vaccine: Not documented  Colorectal Screening   Hemoccult: Negative  (05/02/2005)    Colonoscopy: Not documented  Other Screening   PSA: 2.81  (08/28/2006)   Smoking status: current  (07/09/2009)   Smoking cessation counseling: yes  (07/09/2009)  Diabetes Mellitus   HgbA1C: 7.1  (07/09/2009)   HgbA1C action/deferral: Ordered  (07/09/2009)    Eye exam: No diabetic retinopathy.    Best-corrected VA: 20/20+ OD, 20/15 OS IOP: 20 OD, 18 OS.  Done by [facility]  (01/01/2007)   Eye exam due: 01/2008    Foot exam: yes  (07/09/2009)   Foot exam action/deferral: Do today   High risk foot: No  (07/09/2009)   Foot care education: Done  (07/09/2009)    Urine  microalbumin/creatinine ratio: 86.8  (10/29/2008)    Diabetes flowsheet reviewed?: Yes   Progress toward A1C goal: Deteriorated  Lipids   Total Cholesterol: 160  (03/31/2009)   LDL: 102  (03/31/2009)   LDL Direct: Not documented   HDL: 41  (03/31/2009)   Triglycerides: 86  (03/31/2009)    SGOT (AST): 20  (12/21/2008)   SGPT (ALT): 13  (12/21/2008)   Alkaline phosphatase: 63  (12/21/2008)   Total bilirubin: 0.3  (12/21/2008)    Lipid flowsheet reviewed?: Yes   Progress toward LDL goal: Unchanged  Hypertension   Last Blood Pressure: 129 / 88  (07/09/2009)   Serum creatinine: 1.34  (03/31/2009)   Serum potassium 4.1  (03/31/2009)  Hypertension flowsheet reviewed?: Yes   Progress toward BP goal: At goal  Self-Management Support :   Personal Goals (by the next clinic visit) :     Personal A1C goal: 6  (03/24/2009)     Personal blood pressure goal: 130/80  (03/24/2009)     Personal LDL goal: 100  (03/24/2009)    Patient will work on the following items until the next clinic visit to reach self-care goals:     Medications and monitoring: take my medicines every day, bring all of my medications to every visit  (07/09/2009)     Eating: drink diet soda or water instead of juice or soda, eat more vegetables, eat baked foods instead of fried foods, eat fruit for snacks and desserts  (07/09/2009)     Activity: take a 30 minute walk every day  (07/09/2009)     Other: looking at smoking cessation - walked 2+ miles yesterday and does so regularly  (07/09/2009)    Diabetes self-management support: Written self-care plan  (07/09/2009)   Diabetes care plan printed    Hypertension self-management support: Written self-care plan  (07/09/2009)   Hypertension self-care plan printed.    Lipid self-management support: Written self-care plan  (07/09/2009)   Lipid self-care plan printed.   Nursing Instructions: HgbA1C today (see order) CBG today (see order) Diabetic foot exam  today     Laboratory Results   Blood Tests   Date/Time Received: July 09, 2009 10:42 AM Date/Time Reported: .SIGN  HGBA1C: 7.1%   (Normal Range: Non-Diabetic - 3-6%   Control Diabetic - 6-8%) CBG Random:: 154mg /dL

## 2010-08-04 NOTE — Progress Notes (Signed)
Summary: refill/gg  Phone Note Refill Request  on August 05, 2009 3:36 PM  Refills Requested: Medication #1:  PRAVACHOL 40 MG TABS Take 1 tablet by mouth once a day. Pt has 1 refill but going ? OOT and will need another refill before he sees you again   Method Requested: Electronic Initial call taken by: Merrie Roof RN,  August 05, 2009 3:36 PM    Prescriptions: PRAVACHOL 40 MG TABS (PRAVASTATIN SODIUM) Take 1 tablet by mouth once a day  #30 x 1   Entered and Authorized by:   Laren Everts MD   Signed by:   Laren Everts MD on 08/05/2009   Method used:   Electronically to        [facility] [phone]* (retail)       [id number]       [place], [place]  [address]       Ph: [phone] or [phone]       Fax: [phone]   RxID:   [id number]

## 2010-08-04 NOTE — Assessment & Plan Note (Signed)
Summary: EST-CK/FU/MEDS/CFB   Vital Signs:  Patient profile:   55 year old male Height:      72 inches (182.88 cm) Weight:      237.3 pounds (107.86 kg) BMI:     32.30 Temp:     98.0 degrees F Pulse rate:   83 / minute BP sitting:   130 / 86  (right arm) Cuff size:   large  Vitals Entered By: Dorie Rank RN (February 24, 2010 2:23 PM) CC: check up Is Patient Diabetic? Yes Did you bring your meter with you today? No Pain Assessment Patient in pain? yes     Location: back Intensity: 1 Type: painful Onset of pain  since surgery Nutritional Status BMI of > 30 = obese Nutritional Status Detail appetite down CBG Result 111  Have you ever been in a relationship where you felt threatened, hurt or afraid??   Does patient need assistance? Functional Status Self care Ambulation Normal Comments Refills on meds and discuss chol results. Cont to have pain left back area   Primary Care Provider:  Madelaine Etienne MD  CC:  check up.  History of Present Illness: 55 yr old man with pmhx described below comes to clinic for regular check up. Patient has no complains.   Depression History:      The patient denies a depressed mood most of the day and a diminished interest in his usual daily activities.         Preventive Screening-Counseling & Management  Alcohol-Tobacco     Alcohol type: beer - 2 times/week.     Smoking Status: current     Smoking Cessation Counseling: yes     Packs/Day: 2.0     Year Started: recently started back  Caffeine-Diet-Exercise     Does Patient Exercise: yes     Type of exercise: walking     Times/week: 7  Problems Prior to Update: 1)  Benign Prostatic Hypertrophy, Hx of  (ICD-V13.8) 2)  Insomnia  (ICD-780.52) 3)  Hair Loss  (ICD-704.00) 4)  Polyuria  (ICD-788.42) 5)  Sexual Activity, High Risk  (ICD-V69.2) 6)  Chest Pain  (ICD-786.50) 7)  Preventive Health Care  (ICD-V70.0) 8)  Symptom, Malaise and Fatigue Nec  (ICD-780.79) 9)  Dyslipidemia   (ICD-272.4) 10)  Hypertension  (ICD-401.9) 11)  Diabetes Mellitus, Type II  (ICD-250.00) 12)  Schizophrenia  (ICD-295.90) 13)  Tobacco Abuse  (ICD-305.1) 14)  Obesity Nos  (ICD-278.00) 15)  Laminectomy, Hx of  (ICD-V45.89)  Medications Prior to Update: 1)  Fluphenazine Decanoate 25 Mg/ml Soln (Fluphenazine Decanoate) .... 1.25 Cc Im Every 2 Weeks 2)  Enalapril-Hydrochlorothiazide 5-12.5 Mg Tabs (Enalapril-Hydrochlorothiazide) .... Take 1 Tablet By Mouth Once A Day 3)  Doxazosin Mesylate 2 Mg Tabs (Doxazosin Mesylate) .... Take 2 Tablets By Mouth Once A Day 4)  Pravachol 40 Mg Tabs (Pravastatin Sodium) .... Take 1 Tablet By Mouth Once A Day 5)  Patanol 0.1 % Soln (Olopatadine Hcl) .Marland Kitchen.. 1 Drop in Eyes Two Times A Day 6)  Flexeril 5 Mg Tabs (Cyclobenzaprine Hcl) .... Take 1 Tablet By Mouth Three Times A Day For 2 Weeks For Muscles Spasm  Current Medications (verified): 1)  Fluphenazine Decanoate 25 Mg/ml Soln (Fluphenazine Decanoate) .... 1.25 Cc Im Every 2 Weeks 2)  Enalapril-Hydrochlorothiazide 5-12.5 Mg Tabs (Enalapril-Hydrochlorothiazide) .... Take 1 Tablet By Mouth Once A Day 3)  Doxazosin Mesylate 2 Mg Tabs (Doxazosin Mesylate) .... Take 2 Tablets By Mouth Once A Day 4)  Pravachol 40 Mg Tabs (Pravastatin  Sodium) .... Take 1 Tablet By Mouth Once A Day 5)  Patanol 0.1 % Soln (Olopatadine Hcl) .Marland Kitchen.. 1 Drop in Eyes Two Times A Day 6)  Flexeril 5 Mg Tabs (Cyclobenzaprine Hcl) .... Take 1 Tablet By Mouth Three Times A Day For 2 Weeks For Muscles Spasm  Allergies: 1)  ! Lipitor 2)  ! Zocor 3)  ! Thorazine  Past History:  Past Medical History: Last updated: 10/25/2007 Hypertension Schizophrenia Laminectomy- s/p Dr. Elesa Hacker Microcytosis Tobacco abuse, ongoing High-risk sexual activity (10/2007)  Family History: Last updated: 10/29/2008 Father - still living, but not in contact.  Unknown Stepfather (73) - still living.  A deacon in church. Mother (died 55) - passed away from  gangrene, DM, HTN. Sister with lupus.  Brother with sickle cell anemia.  Social History: Last updated: 06/11/2007 Smoked 1ppd for 38 years.  H/o remote cocaine use - denies present use.  Risk Factors: Exercise: yes (02/24/2010)  Risk Factors: Smoking Status: current (02/24/2010) Packs/Day: 2.0 (02/24/2010)  Family History: Reviewed history from 10/29/2008 and no changes required. Father - still living, but not in contact.  Unknown Stepfather (73) - still living.  A deacon in church. Mother (died 58) - passed away from gangrene, DM, HTN. Sister with lupus.  Brother with sickle cell anemia.  Social History: Reviewed history from 06/11/2007 and no changes required. Smoked 1ppd for 38 years.  H/o remote cocaine use - denies present use.  Review of Systems  The patient denies fever, chest pain, dyspnea on exertion, peripheral edema, hemoptysis, abdominal pain, melena, hematochezia, hematuria, muscle weakness, and difficulty walking.    Physical Exam  General:  NAD Mouth:  MMM Neck:  supple.   Lungs:  normal respiratory effort, no intercostal retractions, no accessory muscle use, normal breath sounds, no crackles, and no wheezes.   Heart:  normal rate, regular rhythm, no murmur, no gallop, no rub, and no JVD.   Abdomen:  soft, non-tender, and normal bowel sounds.   Msk:  normal ROM Extremities:  No clubbing, cyanosis, edema  Neurologic:  Nonfocal   Impression & Recommendations:  Problem # 1:  DIABETES MELLITUS, TYPE II (ICD-250.00) At goal. Continue diet.  His updated medication list for this problem includes:    Enalapril-hydrochlorothiazide 5-12.5 Mg Tabs (Enalapril-hydrochlorothiazide) .Marland Kitchen... Take 1 tablet by mouth once a day  Orders: T-Hgb A1C (in-house) (16109UE) T- Capillary Blood Glucose (45409)  Labs Reviewed: Creat: 1.09 (11/22/2009)     Last Eye Exam: No diabetic retinopathy.    (01/06/2010) Reviewed HgBA1c results: 6.7 (02/24/2010)  7.1  (11/22/2009)  Problem # 2:  HYPERTENSION (ICD-401.9) At goal. Continue current regimen.  His updated medication list for this problem includes:    Enalapril-hydrochlorothiazide 5-12.5 Mg Tabs (Enalapril-hydrochlorothiazide) .Marland Kitchen... Take 1 tablet by mouth once a day    Doxazosin Mesylate 2 Mg Tabs (Doxazosin mesylate) .Marland Kitchen... Take 2 tablets by mouth once a day  BP today: 130/86 Prior BP: 143/92 (11/22/2009)  Labs Reviewed: K+: 3.8 (11/22/2009) Creat: : 1.09 (11/22/2009)   Chol: 174 (11/25/2009)   HDL: 51 (11/25/2009)   LDL: 110 (11/25/2009)   TG: 64 (11/25/2009)  Problem # 3:  DYSLIPIDEMIA (ICD-272.4) Recheck FLP on follow up.  His updated medication list for this problem includes:    Pravachol 40 Mg Tabs (Pravastatin sodium) .Marland Kitchen... Take 1 tablet by mouth once a day  Labs Reviewed: SGOT: 17 (11/22/2009)   SGPT: 13 (11/22/2009)   HDL:51 (11/25/2009), 41 (03/31/2009)  LDL:110 (11/25/2009), 102 (03/31/2009)  Chol:174 (11/25/2009), 160 (03/31/2009)  Trig:64 (11/25/2009), 86 (03/31/2009)  Problem # 4:  SCHIZOPHRENIA (ICD-295.90) Stable. Continue current regimen.  Problem # 5:  TOBACCO ABUSE (ICD-305.1) Encouraged smoking cessation and discussed different methods for smoking cessation.   Problem # 6:  MICROCYTOSIS (ICD-790.09) Will check iron studies on follow up. Hgb wnl. May be 2/2 thalassemia.  Complete Medication List: 1)  Fluphenazine Decanoate 25 Mg/ml Soln (Fluphenazine decanoate) .... 1.25 cc im every 2 weeks 2)  Enalapril-hydrochlorothiazide 5-12.5 Mg Tabs (Enalapril-hydrochlorothiazide) .... Take 1 tablet by mouth once a day 3)  Doxazosin Mesylate 2 Mg Tabs (Doxazosin mesylate) .... Take 2 tablets by mouth once a day 4)  Pravachol 40 Mg Tabs (Pravastatin sodium) .... Take 1 tablet by mouth once a day 5)  Patanol 0.1 % Soln (Olopatadine hcl) .Marland Kitchen.. 1 drop in eyes two times a day 6)  Flexeril 5 Mg Tabs (Cyclobenzaprine hcl) .... Take 1 tablet by mouth three times a day for 2 weeks  for muscles spasm  Patient Instructions: 1)  Please schedule a follow-up appointment in 3 months. 2)  Continue to take all medication as directed.  Prevention & Chronic Care Immunizations   Influenza vaccine: Fluvax MCR  (03/24/2009)    Tetanus booster: Not documented    Pneumococcal vaccine: Not documented  Colorectal Screening   Hemoccult: Negative  (05/02/2005)    Colonoscopy: Not documented  Other Screening   PSA: 4.30  (11/22/2009)   PSA action/deferral: Discussed-PSA requested  (11/22/2009)   Smoking status: current  (02/24/2010)   Smoking cessation counseling: yes  (02/24/2010)  Diabetes Mellitus   HgbA1C: 6.7  (02/24/2010)   HgbA1C action/deferral: Ordered  (02/24/2010)    Eye exam: No diabetic retinopathy.     (01/06/2010)   Eye exam due: 01/2011    Foot exam: yes  (07/09/2009)   Foot exam action/deferral: Do today   High risk foot: No  (07/09/2009)   Foot care education: Done  (07/09/2009)    Urine microalbumin/creatinine ratio: 25.5  (11/22/2009)   Urine microalbumin action/deferral: Ordered    Diabetes flowsheet reviewed?: Yes   Progress toward A1C goal: At goal  Lipids   Total Cholesterol: 174  (11/25/2009)   Lipid panel action/deferral: Lipid Panel ordered   LDL: 110  (11/25/2009)   LDL Direct: Not documented   HDL: 51  (11/25/2009)   Triglycerides: 64  (11/25/2009)    SGOT (AST): 17  (11/22/2009)   BMP action: Ordered   SGPT (ALT): 13  (11/22/2009)   Alkaline phosphatase: 63  (11/22/2009)   Total bilirubin: 0.3  (11/22/2009)    Lipid flowsheet reviewed?: Yes   Progress toward LDL goal: Unchanged  Hypertension   Last Blood Pressure: 130 / 86  (02/24/2010)   Serum creatinine: 1.09  (11/22/2009)   BMP action: Ordered   Serum potassium 3.8  (11/22/2009)    Hypertension flowsheet reviewed?: Yes   Progress toward BP goal: At goal  Self-Management Support :   Personal Goals (by the next clinic visit) :     Personal A1C goal: 6   (03/24/2009)     Personal blood pressure goal: 130/80  (03/24/2009)     Personal LDL goal: 100  (03/24/2009)    Patient will work on the following items until the next clinic visit to reach self-care goals:     Medications and monitoring: take my medicines every day, check my blood sugar, bring all of my medications to every visit, weigh myself weekly, examine my feet every day  (02/24/2010)     Eating: eat  more vegetables, use fresh or frozen vegetables, eat foods that are low in salt, eat fruit for snacks and desserts  (02/24/2010)     Activity: take a 30 minute walk every day, park at the far end of the parking lot  (02/24/2010)     Other: looking at smoking cessation - walked 2+ miles yesterday and does so regularly  (07/09/2009)    Diabetes self-management support: Written self-care plan, Education handout, Resources for patients handout  (02/24/2010)   Diabetes care plan printed   Diabetes education handout printed    Hypertension self-management support: Written self-care plan, Education handout, Resources for patients handout  (02/24/2010)   Hypertension self-care plan printed.   Hypertension education handout printed    Lipid self-management support: Written self-care plan, Education handout, Resources for patients handout  (02/24/2010)   Lipid self-care plan printed.   Lipid education handout printed      Resource handout printed.   Nursing Instructions: HgbA1C today (see order) CBG today (see order)     Laboratory Results   Blood Tests   Date/Time Received: February 24, 2010 2:49 PM Date/Time Reported: Alric Quan  February 24, 2010 2:49 PM   HGBA1C: 6.7%   (Normal Range: Non-Diabetic - 3-6%   Control Diabetic - 6-8%) CBG Random:: 111mg /dL

## 2010-08-04 NOTE — Progress Notes (Signed)
Summary: Refill/gh  Phone Note Refill Request Message from:  Fax from Pharmacy on August 05, 2009 12:01 PM  Refills Requested: Medication #1:  ENALAPRIL-HYDROCHLOROTHIAZIDE 5-12.5 MG TABS Take 1 tablet by mouth once a day   Last Refilled: 07/05/2009  Medication #2:  DOXAZOSIN MESYLATE 2 MG TABS Take 1 tablet by mouth once a day   Last Refilled: 07/05/2009  Method Requested: Electronic Initial call taken by: Angelina Ok RN,  August 05, 2009 12:02 PM    Prescriptions: DOXAZOSIN MESYLATE 2 MG TABS (DOXAZOSIN MESYLATE) Take 1 tablet by mouth once a day  #30 x 3   Entered and Authorized by:   Laren Everts MD   Signed by:   Laren Everts MD on 08/05/2009   Method used:   [facility]        [facility] [address] [phone]* (retail)       [address]       [place], [place]  [address]       Ph: [phone] or [phone]       Fax: [phone]   RxID:   [id number] ENALAPRIL-HYDROCHLOROTHIAZIDE 5-12.5 MG TABS (ENALAPRIL-HYDROCHLOROTHIAZIDE) Take 1 tablet by mouth once a day  #30 x 3   Entered and Authorized by:   Laren Everts MD   Signed by:   Laren Everts MD on 08/05/2009   Method used:   [facility]        [facility] [address] [phone]* (retail)       [address]       [place], [place]  [address]       Ph: [phone] or [phone]       Fax: [phone]   RxID:   [id number]

## 2010-08-04 NOTE — Progress Notes (Signed)
Summary: refill/ hla  Phone Note Refill Request Message from:  Fax from Pharmacy on September 29, 2009 10:49 AM  Refills Requested: Medication #1:  PRAVACHOL 40 MG TABS Take 1 tablet by mouth once a day.   Last Refilled: 3/2 Initial call taken by: Marin Roberts RN,  September 29, 2009 10:49 AM    Prescriptions: PRAVACHOL 40 MG TABS (PRAVASTATIN SODIUM) Take 1 tablet by mouth once a day  #30 x 3   Entered and Authorized by:   Laren Everts MD   Signed by:   Laren Everts MD on 09/29/2009   Method used:   [facility]        [facility] [address] [phone]* (retail)       [address]       [place], [place]  [address]       Ph: [phone] or [phone]       Fax: [phone]   RxID:   [id number]

## 2010-08-04 NOTE — Progress Notes (Signed)
Summary: Refill/gh  Phone Note Refill Request Message from:  Fax from Pharmacy on April 05, 2010 10:21 AM  Refills Requested: Medication #1:  DOXAZOSIN MESYLATE 2 MG TABS Take 2 tablets by mouth once a day   Last Refilled: 03/02/2010  Method Requested: Electronic Initial call taken by: Angelina Ok RN,  April 05, 2010 10:21 AM    Prescriptions: DOXAZOSIN MESYLATE 2 MG TABS (DOXAZOSIN MESYLATE) Take 2 tablets by mouth once a day  #60 x 3   Entered and Authorized by:   Laren Everts MD   Signed by:   Laren Everts MD on 04/05/2010   Method used:   [facility]ronically to        [facility] [address] [phone]* (retail)       [address]       [place], [place]  [address]       Ph: [phone] or [phone]       Fax: [phone]   RxID:   [id number]

## 2010-08-04 NOTE — Consult Note (Signed)
Summary: [facility]  [facility]   Imported By: Louretta Parma 03/15/2010 16:51:58  _____________________________________________________________________  External Attachment:    Type:   Image     Comment:   External Document

## 2010-08-10 NOTE — Progress Notes (Signed)
Summary: Refill/gh  Phone Note Refill Request Message from:  Fax from Pharmacy on August 01, 2010 2:25 PM  Refills Requested: Medication #1:  ENALAPRIL-HYDROCHLOROTHIAZIDE 5-12.5 MG TABS Take 1 tablet by mouth once a day   Last Refilled: 06/30/2010  Medication #2:  DOXAZOSIN MESYLATE 2 MG TABS Take 2 tablets by mouth once a day   Last Refilled: 06/30/2010 Last office visit was 07/07/2010.  Last labs were 11/22/2009.   Method Requested: Electronic Initial call taken by: Angelina Ok RN,  August 01, 2010 2:25 PM    Prescriptions: DOXAZOSIN MESYLATE 2 MG TABS (DOXAZOSIN MESYLATE) Take 2 tablets by mouth once a day  #60 x 3   Entered and Authorized by:   Ulyess Mort MD   Signed by:   Ulyess Mort MD on 08/01/2010   Method used:   [facility]ronically to        [facility] [address] [phone]* (retail)       [address]       [place], [place]  [address]       Ph: [phone] or [phone]       Fax: [phone]   RxID:   [id number] ENALAPRIL-HYDROCHLOROTHIAZIDE 5-12.5 MG TABS (ENALAPRIL-HYDROCHLOROTHIAZIDE) Take 1 tablet by mouth once a day  #30 x 3   Entered and Authorized by:   Ulyess Mort MD   Signed by:   Ulyess Mort MD on 08/01/2010   Method used:   [facility]ronically to        [facility] [address] [phone]* (retail)       [address]       [place], [place]  [address]       Ph: [phone] or [phone]       Fax: [phone]   RxID:   [id number]

## 2010-09-15 LAB — GLUCOSE, CAPILLARY: Glucose-Capillary: 111 mg/dL — ABNORMAL HIGH (ref 70–99)

## 2010-09-17 LAB — GLUCOSE, CAPILLARY: Glucose-Capillary: 154 mg/dL — ABNORMAL HIGH (ref 70–99)

## 2010-09-19 LAB — GLUCOSE, CAPILLARY: Glucose-Capillary: 146 mg/dL — ABNORMAL HIGH (ref 70–99)

## 2010-10-07 LAB — GLUCOSE, CAPILLARY: Glucose-Capillary: 135 mg/dL — ABNORMAL HIGH (ref 70–99)

## 2010-10-10 LAB — GLUCOSE, CAPILLARY: Glucose-Capillary: 118 mg/dL — ABNORMAL HIGH (ref 70–99)

## 2010-10-12 LAB — GLUCOSE, CAPILLARY: Glucose-Capillary: 85 mg/dL (ref 70–99)

## 2010-11-04 ENCOUNTER — Other Ambulatory Visit: Payer: Self-pay | Admitting: *Deleted

## 2010-11-04 ENCOUNTER — Other Ambulatory Visit: Payer: Self-pay | Admitting: Internal Medicine

## 2010-11-04 MED ORDER — PRAVASTATIN SODIUM 40 MG PO TABS: 40.0000 mg | ORAL_TABLET | Freq: Every evening | ORAL | Status: DC

## 2010-11-07 ENCOUNTER — Other Ambulatory Visit: Payer: Self-pay | Admitting: *Deleted

## 2010-11-07 MED ORDER — ENALAPRIL-HYDROCHLOROTHIAZIDE 5-12.5 MG PO TABS: 1.0000 | ORAL_TABLET | Freq: Every day | ORAL | Status: DC

## 2010-11-24 ENCOUNTER — Encounter: Payer: Self-pay | Admitting: Internal Medicine

## 2010-11-29 ENCOUNTER — Other Ambulatory Visit: Payer: Self-pay | Admitting: *Deleted

## 2010-11-29 MED ORDER — ENALAPRIL-HYDROCHLOROTHIAZIDE 5-12.5 MG PO TABS: 1.0000 | ORAL_TABLET | Freq: Every day | ORAL | Status: DC

## 2010-11-29 MED ORDER — DOXAZOSIN MESYLATE 2 MG PO TABS: 4.0000 mg | ORAL_TABLET | Freq: Every day | ORAL | Status: DC

## 2010-12-22 ENCOUNTER — Ambulatory Visit (INDEPENDENT_AMBULATORY_CARE_PROVIDER_SITE_OTHER): Payer: PRIVATE HEALTH INSURANCE | Admitting: Internal Medicine

## 2010-12-22 ENCOUNTER — Encounter: Payer: Self-pay | Admitting: Internal Medicine

## 2010-12-22 DIAGNOSIS — B353 Tinea pedis: Secondary | ICD-10-CM | POA: Insufficient documentation

## 2010-12-22 DIAGNOSIS — I10 Essential (primary) hypertension: Secondary | ICD-10-CM

## 2010-12-22 DIAGNOSIS — E119 Type 2 diabetes mellitus without complications: Secondary | ICD-10-CM

## 2010-12-22 DIAGNOSIS — E785 Hyperlipidemia, unspecified: Secondary | ICD-10-CM

## 2010-12-22 LAB — COMPREHENSIVE METABOLIC PANEL
ALT: 14 U/L (ref 0–53)
CO2: 28 mEq/L (ref 19–32)
Calcium: 10.1 mg/dL (ref 8.4–10.5)
Chloride: 103 mEq/L (ref 96–112)
Sodium: 140 mEq/L (ref 135–145)
Total Protein: 7.6 g/dL (ref 6.0–8.3)

## 2010-12-22 LAB — GLUCOSE, CAPILLARY: Glucose-Capillary: 93 mg/dL (ref 70–99)

## 2010-12-22 LAB — LIPID PANEL
Cholesterol: 171 mg/dL (ref 0–200)
HDL: 52 mg/dL
LDL Cholesterol: 90 mg/dL (ref 0–99)
Total CHOL/HDL Ratio: 3.3 ratio
Triglycerides: 144 mg/dL
VLDL: 29 mg/dL (ref 0–40)

## 2010-12-22 LAB — POCT GLYCOSYLATED HEMOGLOBIN (HGB A1C): Hemoglobin A1C: 6.4

## 2010-12-22 MED ORDER — KETOCONAZOLE 2 % EX CREA: TOPICAL_CREAM | Freq: Every day | CUTANEOUS | Status: DC

## 2010-12-22 NOTE — Progress Notes (Signed)
  Subjective:    Patient ID: Wayne Schmidt, male    DOB: 1956-03-19, 56 y.o.   MRN: [id number]  HPI  55 yr old man with  Past Medical History  Diagnosis Date  . Hypertension   . Schizophrenia   . Microcytosis   . Substance abuse     tobacco use  . High risk sexual behavior 10/2007   comes to the clinic for regular check up of Diabetes, and Hyperension. Patient reports to be taking all medication as directed.  Reports that lotrimin has not helped tenia pedis.   Otherwise patient has no other complains.  Review of Systems  All other systems reviewed and are negative.       Objective:   Physical Exam  Vitals reviewed. Constitutional: He is oriented to person, place, and time. He appears well-nourished. No distress.  HENT:  Mouth/Throat: Oropharynx is clear and moist.  Eyes: EOM are normal.  Neck: Normal range of motion. Neck supple.  Cardiovascular: Normal rate, regular rhythm and normal heart sounds.   Pulmonary/Chest: Effort normal and breath sounds normal.  Abdominal: Soft. Bowel sounds are normal.  Musculoskeletal: Normal range of motion.  Neurological: He is alert and oriented to person, place, and time.  Skin:       Tenia pedis noted right and left interdigital areas  Psychiatric: He has a normal mood and affect.          Assessment & Plan:

## 2010-12-22 NOTE — Patient Instructions (Signed)
Please follow up in 3 months. Remember to discontinue lotrimin cream and start ketoconazole cream as directed.

## 2010-12-23 ENCOUNTER — Encounter: Payer: Self-pay | Admitting: Internal Medicine

## 2010-12-23 NOTE — Assessment & Plan Note (Signed)
Controlled. Renal function slightly increased. Will have patient hold ace/hctz until renal function is rechecked next week. Reassess and consider further work up based on repeat bmet.

## 2010-12-23 NOTE — Assessment & Plan Note (Signed)
Changed lotrimin to ketoconazole cream to use as directed. Discussed the option to try oral antifungals in the future if cream does not help. Would be reluctant to start patient on oral antifungals unless patient agrees to discontinue alcohol consumption.

## 2010-12-26 NOTE — Assessment & Plan Note (Signed)
Good control on diet and exerciase. Continue to monitor.

## 2010-12-26 NOTE — Assessment & Plan Note (Signed)
At goal. Continue current regimen. 

## 2010-12-28 ENCOUNTER — Other Ambulatory Visit: Payer: PRIVATE HEALTH INSURANCE

## 2010-12-28 DIAGNOSIS — E119 Type 2 diabetes mellitus without complications: Secondary | ICD-10-CM

## 2010-12-28 DIAGNOSIS — I10 Essential (primary) hypertension: Secondary | ICD-10-CM

## 2010-12-28 LAB — BASIC METABOLIC PANEL
BUN: 13 mg/dL (ref 6–23)
Chloride: 103 mEq/L (ref 96–112)
Glucose, Bld: 115 mg/dL — ABNORMAL HIGH (ref 70–99)
Potassium: 4.2 mEq/L (ref 3.5–5.3)

## 2010-12-29 LAB — MICROALBUMIN / CREATININE URINE RATIO: Creatinine, Urine: 175.9 mg/dL

## 2011-01-23 ENCOUNTER — Emergency Department (HOSPITAL_COMMUNITY)
Admission: EM | Admit: 2011-01-23 | Discharge: 2011-01-23 | Disposition: A | Discharge status: Home / Self Care | Payer: PRIVATE HEALTH INSURANCE | Attending: Emergency Medicine | Admitting: Emergency Medicine

## 2011-01-23 DIAGNOSIS — I10 Essential (primary) hypertension: Secondary | ICD-10-CM | POA: Insufficient documentation

## 2011-01-23 DIAGNOSIS — E119 Type 2 diabetes mellitus without complications: Secondary | ICD-10-CM | POA: Insufficient documentation

## 2011-01-23 DIAGNOSIS — E7800 Pure hypercholesterolemia, unspecified: Secondary | ICD-10-CM | POA: Insufficient documentation

## 2011-01-23 DIAGNOSIS — L97509 Non-pressure chronic ulcer of other part of unspecified foot with unspecified severity: Secondary | ICD-10-CM | POA: Insufficient documentation

## 2011-01-23 DIAGNOSIS — M79609 Pain in unspecified limb: Secondary | ICD-10-CM | POA: Insufficient documentation

## 2011-01-23 DIAGNOSIS — F209 Schizophrenia, unspecified: Secondary | ICD-10-CM | POA: Insufficient documentation

## 2011-01-30 ENCOUNTER — Encounter (HOSPITAL_BASED_OUTPATIENT_CLINIC_OR_DEPARTMENT_OTHER): Payer: PRIVATE HEALTH INSURANCE | Attending: General Surgery

## 2011-01-30 DIAGNOSIS — B351 Tinea unguium: Secondary | ICD-10-CM | POA: Insufficient documentation

## 2011-01-30 DIAGNOSIS — R7309 Other abnormal glucose: Secondary | ICD-10-CM | POA: Insufficient documentation

## 2011-01-30 DIAGNOSIS — I10 Essential (primary) hypertension: Secondary | ICD-10-CM | POA: Insufficient documentation

## 2011-01-30 DIAGNOSIS — Z79899 Other long term (current) drug therapy: Secondary | ICD-10-CM | POA: Insufficient documentation

## 2011-01-30 DIAGNOSIS — F259 Schizoaffective disorder, unspecified: Secondary | ICD-10-CM | POA: Insufficient documentation

## 2011-01-30 DIAGNOSIS — E785 Hyperlipidemia, unspecified: Secondary | ICD-10-CM | POA: Insufficient documentation

## 2011-01-30 DIAGNOSIS — L97509 Non-pressure chronic ulcer of other part of unspecified foot with unspecified severity: Secondary | ICD-10-CM | POA: Insufficient documentation

## 2011-01-30 NOTE — Progress Notes (Unsigned)
[facility]  NAME:  Wayne Schmidt, Wayne Schmidt              ACCOUNT NO.:  [id number]  MEDICAL RECORD NO.:  [id number]      DATE OF BIRTH:  1955/10/18  PHYSICIAN:  Jonelle Sports. Sevier, M.D.  VISIT DATE:  01/30/2011                                  OFFICE VISIT   HISTORY:  This 55 year old black male is seen for evaluation of interdigital ulcers at the fourth and fifth interspaces on each foot.  The patient gives a history of having had these on multiple occasions in the past and has been told that they are secondary to athlete's foot. They have near healed on occasion but apparently never have completely healed.  Most recently, he has been prescribed ketoconazole cream which he has used but he also has been soaking his feet in peroxide at bedtime.  He says that he has a good deal of pain but does admit that this is only after he does those soaks.  He has had no spread of this elsewhere on the foot.  He has had no significant swelling, no fever or other systemic symptoms and no significant amount of drainage.  PAST MEDICAL HISTORY:  Notable for: 1. Circumcision in 1988. 2. Lumbar laminectomy in 1996. 3. Arthroscopy of the right knee in 1998.  He has had no other medical hospitalizations.  ALLERGIES:  He is said to be allergic to HALDOL, THORAZINE, LIPITOR, ZOCOR and LITHIUM.  REGULAR MEDICATIONS: 1. Doxazosin 4 mg daily. 2. Pravastatin 40 mg daily. 3. Enalapril/hydrochlorothiazide 5/12.5 half daily. 4. Hydrocodone which he was recently given for this pain in his feet.  FAMILY HISTORY:  Indicated by the patient to be positive for hypertension, difficulty with the lower extremities related to poor circulation, diabetes type 2, and pulmonary problems in association with tobacco use.  PERSONAL HISTORY:  The patient is married, is separated from his white. He is currently employed as a Agricultural consultant at [employer].  He is a smoker of 42 years duration approximately 2  packs per day.  He drinks 40 ounces of beer once or twice a week and denies use of recreational drugs.  REVIEW OF SYSTEMS:  The patient has known hypertension and hyperlipidemia.  Denies any known heart disease, has never had chest pains or heart attack, has never had cerebrovascular accident, has never been told there is any significant circulatory impairment of his lower extremities.  His diabetes has been called borderline and A1c apparently is 6.4.  He avoids sugars and does not check his own sugar at home.  He has had no symptoms to suggest diabetic neuropathy in his feet or any other diabetic complications.  He has no history of gastrointestinal bleeding.  He does have a significant history of a schizoaffective disorder for which he is currently medicated and which has revealed many of his drug allergies as you see above.  PHYSICAL EXAMINATION:  VITAL SIGNS:  Blood pressure is 144/88, pulse is 64 and regular, respirations 20, temperature 97.9. GENERAL:  The patient is alert, cooperative black male who appears his stated age.  He is in no apparent distress. HEENT:  His mucous membranes are moist and pink.  He has poor dentition. NECK:  He has no cervical adenopathy.  His neck veins are flat.  Carotid pulses are without bruits.  CHEST:  Grossly clear to auscultation and percussion. HEART:  Size is indeterminate.  His tones are good quality.  He clearly has an S4 heard throughout the precordium. ABDOMEN:  Without organomegaly, masses or tenderness. EXTREMITY:  No edema.  All pulses are quite full and quite adequate. Capillary filling in the toes is good.  He does have some onychomycosis of his nails of the feet which he keeps under check by using Vicks vapor rub.  In the interdigital spaces of 4-5 bilaterally are callus ulcerated areas with dimensions approximately 0.3 x 0.2 x 0.1 cm.  There is no significant drainage, no odor.  IMPRESSION:  Interdigital ulcerations, basis  uncertain, possibly fungal.  DISPOSITION:  The wounds are cultured bilaterally.  This culture to be incubated for both bacteria and fungus.  The areas are sharply pared and good deal of callus and corn white material was removed from both without getting into any deep space.  The wounds are clearly improved after such debridement.  They are then treated with an application of ketoconazole and  lambs wool was placed between the toes.  He was placed in healing sandals bilaterally.  It is recommended to the patient that he change his ketoconazole twice daily, cleansing the areas and pat drying and air drying well before reapplying ketoconazole and replacing the lambs wool.  He is to remain in his healing sandals.  He is advised to keep the area as dry as possible and certainly not to soak his feet.  He is advised that OTC ibuprofen or acetaminophen should be adequate for any pain that he has. He accepts that recommendation.  Followup visit will be here in 2 weeks at which time culture report should be available.          ______________________________ Jonelle Sports. Cheryll Cockayne, M.D.     RES/MEDQ  D:  01/30/2011  T:  01/30/2011  Job:  [id number]

## 2011-02-03 ENCOUNTER — Emergency Department (HOSPITAL_COMMUNITY)
Admission: EM | Admit: 2011-02-03 | Discharge: 2011-02-03 | Disposition: A | Discharge status: Home / Self Care | Payer: PRIVATE HEALTH INSURANCE | Attending: Emergency Medicine | Admitting: Emergency Medicine

## 2011-02-03 DIAGNOSIS — G8929 Other chronic pain: Secondary | ICD-10-CM | POA: Insufficient documentation

## 2011-02-03 DIAGNOSIS — M79609 Pain in unspecified limb: Secondary | ICD-10-CM | POA: Insufficient documentation

## 2011-02-03 DIAGNOSIS — E7800 Pure hypercholesterolemia, unspecified: Secondary | ICD-10-CM | POA: Insufficient documentation

## 2011-02-03 DIAGNOSIS — I10 Essential (primary) hypertension: Secondary | ICD-10-CM | POA: Insufficient documentation

## 2011-02-03 DIAGNOSIS — E119 Type 2 diabetes mellitus without complications: Secondary | ICD-10-CM | POA: Insufficient documentation

## 2011-02-03 LAB — GLUCOSE, CAPILLARY: Glucose-Capillary: 120 mg/dL — ABNORMAL HIGH (ref 70–99)

## 2011-02-13 ENCOUNTER — Encounter (HOSPITAL_BASED_OUTPATIENT_CLINIC_OR_DEPARTMENT_OTHER): Payer: PRIVATE HEALTH INSURANCE | Attending: General Surgery

## 2011-02-13 DIAGNOSIS — F259 Schizoaffective disorder, unspecified: Secondary | ICD-10-CM | POA: Insufficient documentation

## 2011-02-13 DIAGNOSIS — B351 Tinea unguium: Secondary | ICD-10-CM | POA: Insufficient documentation

## 2011-02-13 DIAGNOSIS — L97509 Non-pressure chronic ulcer of other part of unspecified foot with unspecified severity: Secondary | ICD-10-CM | POA: Insufficient documentation

## 2011-02-13 DIAGNOSIS — E785 Hyperlipidemia, unspecified: Secondary | ICD-10-CM | POA: Insufficient documentation

## 2011-02-13 DIAGNOSIS — Z79899 Other long term (current) drug therapy: Secondary | ICD-10-CM | POA: Insufficient documentation

## 2011-02-13 DIAGNOSIS — I10 Essential (primary) hypertension: Secondary | ICD-10-CM | POA: Insufficient documentation

## 2011-02-13 DIAGNOSIS — R7309 Other abnormal glucose: Secondary | ICD-10-CM | POA: Insufficient documentation

## 2011-02-27 ENCOUNTER — Other Ambulatory Visit (HOSPITAL_BASED_OUTPATIENT_CLINIC_OR_DEPARTMENT_OTHER): Payer: Self-pay | Admitting: General Surgery

## 2011-02-27 LAB — GLUCOSE, CAPILLARY: Glucose-Capillary: 112 mg/dL — ABNORMAL HIGH (ref 70–99)

## 2011-03-07 ENCOUNTER — Other Ambulatory Visit: Payer: Self-pay | Admitting: Internal Medicine

## 2011-03-13 ENCOUNTER — Encounter (HOSPITAL_BASED_OUTPATIENT_CLINIC_OR_DEPARTMENT_OTHER): Payer: PRIVATE HEALTH INSURANCE | Attending: Internal Medicine

## 2011-03-13 DIAGNOSIS — B353 Tinea pedis: Secondary | ICD-10-CM | POA: Insufficient documentation

## 2011-03-13 DIAGNOSIS — F209 Schizophrenia, unspecified: Secondary | ICD-10-CM | POA: Insufficient documentation

## 2011-03-13 DIAGNOSIS — E785 Hyperlipidemia, unspecified: Secondary | ICD-10-CM | POA: Insufficient documentation

## 2011-03-13 DIAGNOSIS — I10 Essential (primary) hypertension: Secondary | ICD-10-CM | POA: Insufficient documentation

## 2011-03-13 DIAGNOSIS — L97509 Non-pressure chronic ulcer of other part of unspecified foot with unspecified severity: Secondary | ICD-10-CM | POA: Insufficient documentation

## 2011-03-13 DIAGNOSIS — E119 Type 2 diabetes mellitus without complications: Secondary | ICD-10-CM | POA: Insufficient documentation

## 2011-03-13 DIAGNOSIS — Z79899 Other long term (current) drug therapy: Secondary | ICD-10-CM | POA: Insufficient documentation

## 2011-04-03 ENCOUNTER — Encounter (HOSPITAL_BASED_OUTPATIENT_CLINIC_OR_DEPARTMENT_OTHER): Payer: PRIVATE HEALTH INSURANCE | Attending: Internal Medicine

## 2011-04-03 DIAGNOSIS — E119 Type 2 diabetes mellitus without complications: Secondary | ICD-10-CM | POA: Insufficient documentation

## 2011-04-03 DIAGNOSIS — E785 Hyperlipidemia, unspecified: Secondary | ICD-10-CM | POA: Insufficient documentation

## 2011-04-03 DIAGNOSIS — B353 Tinea pedis: Secondary | ICD-10-CM | POA: Insufficient documentation

## 2011-04-03 DIAGNOSIS — F209 Schizophrenia, unspecified: Secondary | ICD-10-CM | POA: Insufficient documentation

## 2011-04-03 DIAGNOSIS — L97509 Non-pressure chronic ulcer of other part of unspecified foot with unspecified severity: Secondary | ICD-10-CM | POA: Insufficient documentation

## 2011-04-03 DIAGNOSIS — I10 Essential (primary) hypertension: Secondary | ICD-10-CM | POA: Insufficient documentation

## 2011-04-03 DIAGNOSIS — Z79899 Other long term (current) drug therapy: Secondary | ICD-10-CM | POA: Insufficient documentation

## 2011-04-06 ENCOUNTER — Ambulatory Visit (INDEPENDENT_AMBULATORY_CARE_PROVIDER_SITE_OTHER): Payer: PRIVATE HEALTH INSURANCE | Admitting: Internal Medicine

## 2011-04-06 ENCOUNTER — Encounter: Payer: Self-pay | Admitting: Internal Medicine

## 2011-04-06 VITALS — BP 136/88 | HR 97 | Temp 97.2°F | Ht 73.0 in | Wt 221.8 lb

## 2011-04-06 DIAGNOSIS — Z299 Encounter for prophylactic measures, unspecified: Secondary | ICD-10-CM

## 2011-04-06 DIAGNOSIS — E785 Hyperlipidemia, unspecified: Secondary | ICD-10-CM

## 2011-04-06 DIAGNOSIS — I10 Essential (primary) hypertension: Secondary | ICD-10-CM

## 2011-04-06 DIAGNOSIS — B351 Tinea unguium: Secondary | ICD-10-CM

## 2011-04-06 DIAGNOSIS — Z23 Encounter for immunization: Secondary | ICD-10-CM

## 2011-04-06 DIAGNOSIS — E119 Type 2 diabetes mellitus without complications: Secondary | ICD-10-CM

## 2011-04-06 LAB — GLUCOSE, CAPILLARY: Glucose-Capillary: 101 mg/dL — ABNORMAL HIGH (ref 70–99)

## 2011-04-06 NOTE — Progress Notes (Signed)
Subjective:   Patient ID: Wayne Schmidt male   DOB: 10/30/1955 55 y.o.   MRN: [id number]  HPI: [name] is a 55 y.o. with PMH significant who presented to the clinic for a regular office visit. Patient noted he is doing fine except that he was diagnosed with fungus on his feet and was started on oral antifungal and cream by Wound care specialist. It is improving and he will follow up next week with the center.     Past Medical History  Diagnosis Date  . Hypertension   . Schizophrenia   . Microcytosis   . Substance abuse     tobacco use  . High risk sexual behavior 10/2007   Current Outpatient Prescriptions  Medication Sig Dispense Refill  . ciclopirox (PENLAC) 8 % solution Apply topically at bedtime. Apply over nail and surrounding skin. Apply daily over previous coat. After seven (7) days, may remove with alcohol and continue cycle.       Marland Kitchen doxazosin (CARDURA) 2 MG tablet TAKE 2 TABLETS DAILY  60 tablet  2  . Enalapril-Hydrochlorothiazide 5-12.5 MG per tablet TAKE 1 TABLET BY MOUTH DAILY.  30 tablet  2  . fluPHENAZine decanoate (PROLIXIN) 25 MG/ML injection Inject into the muscle every 14 (fourteen) days. Inject 1.25cc of 25mg /ml solution every 2 weeks       . ketoconazole (NIZORAL) 2 % cream Apply topically daily.  15 g  0  . olopatadine (PATANOL) 0.1 % ophthalmic solution Place 1 drop into both eyes 2 (two) times daily.        . pravastatin (PRAVACHOL) 40 MG tablet TAKE 1 TABLET BY MOUTH EVERY DAY  30 tablet  3   Family History  Problem Relation Age of Onset  . Diabetes Mother   . Hypertension Mother   . Lupus Sister   . Sickle cell anemia Brother    History   Social History  . Marital Status: Legally Separated    Spouse Name: N/A    Number of Children: N/A  . Years of Education: N/A   Social History Main Topics  . Smoking status: Current Everyday Smoker -- 2.0 packs/day for 38 years    Types: Cigarettes  . Smokeless tobacco: Not on file  . Alcohol Use: No    . Drug Use: No     h/o remote cocaine use-denies present use  . Sexually Active: Not on file   Other Topics Concern  . Not on file   Social History Narrative   Patient given diabetes card 07/07/2010   Review of Systems: Constitutional: Denies fever, chills, diaphoresis, appetite change and fatigue.  HEENT: Denies photophobia, eye pain, redness, hearing loss, ear pain, congestion, sore throat, rhinorrhea, sneezing, mouth sores, trouble swallowing, neck pain, neck stiffness and tinnitus.   Respiratory: Denies SOB, DOE, cough, chest tightness,  and wheezing.   Cardiovascular: Denies chest pain, palpitations and leg swelling.  Gastrointestinal: Denies nausea, vomiting, abdominal pain, diarrhea, constipation, blood in stool and abdominal distention.  Genitourinary: Denies dysuria, urgency, frequency, hematuria, flank pain and difficulty urinating.  Musculoskeletal: Denies myalgias, back pain, joint swelling, arthralgias and gait problem.  Neurological: Denies dizziness, seizures, syncope, weakness, light-headedness, numbness and headaches.   Objective:  Physical Exam: Filed Vitals:   04/06/11 1117  BP: 136/88  Pulse: 97  Temp: 97.2 F (36.2 C)  TempSrc: Oral  Height: 6\' 1"  (1.854 m)  Weight: 221 lb 12.8 oz (100.608 kg)   Constitutional: Vital signs reviewed.  Patient is a well-developed and well-nourished  in no acute distress and cooperative with exam. Alert and oriented x3.  Ear: TM normal bilaterally Mouth: no erythema or exudates, MMM Eyes: PERRL, EOMI, conjunctivae normal, No scleral icterus.  Neck: Supple, Trachea midline normal ROM, No JVD, mass, Cardiovascular: RRR, S1 normal, S2 normal, no MRG, pulses symmetric and intact bilaterally Pulmonary/Chest: CTAB, no wheezes, rales, or rhonchi Abdominal: Soft. Non-tender, non-distended, bowel sounds are normal, no masses, organomegaly, or guarding present.  GU: no CVA tenderness Musculoskeletal: No joint deformities, erythema, or  stiffness, ROM full and no nontender Neurological: A&O x3, no focal motor deficit, sensory intact to light touch bilaterally.  Skin: feet nail: yellow discoloration. Brittle.

## 2011-04-06 NOTE — Progress Notes (Deleted)
  Subjective:    Patient ID: Wayne Schmidt, male    DOB: [birth year], 55 y.o.   MRN: [id number]  HPI    Review of Systems     Objective:   Physical Exam        Assessment & Plan:

## 2011-04-07 ENCOUNTER — Encounter: Payer: Self-pay | Admitting: Internal Medicine

## 2011-04-07 MED ORDER — ENALAPRIL-HYDROCHLOROTHIAZIDE 10-25 MG PO TABS: 1.0000 | ORAL_TABLET | Freq: Every day | ORAL | Status: DC

## 2011-04-07 NOTE — Assessment & Plan Note (Signed)
Continue on current regimen.LFT normal 12/2010  Lab Results  Component Value Date   CHOL 171 12/22/2010   CHOL 174 11/25/2009   CHOL 160 03/31/2009   Lab Results  Component Value Date   HDL 52 12/22/2010   HDL 51 10/09/8117   HDL 41 1/47/8295   Lab Results  Component Value Date   LDLCALC 90 12/22/2010   LDLCALC 110* 11/25/2009   LDLCALC 102* 03/31/2009   Lab Results  Component Value Date   TRIG 144 12/22/2010   TRIG 64 11/25/2009   TRIG 86 03/31/2009   Lab Results  Component Value Date   CHOLHDL 3.3 12/22/2010   CHOLHDL 3.4 Ratio 11/25/2009   CHOLHDL 3.9 Ratio 03/31/2009   No results found for this basename: LDLDIRECT

## 2011-04-07 NOTE — Assessment & Plan Note (Signed)
Hgb A1c is 6.5. Diet controlled only.continue to monitor.

## 2011-04-07 NOTE — Assessment & Plan Note (Addendum)
Blood pressure SBP around 130. Patient has increased microalbuminuria. Will increase enalapril -hydrochlorothiazide to 10/25mg . Will recheck Bmet in 2 weeks.  BP Readings from Last 3 Encounters:  04/06/11 136/88  12/22/10 131/85  07/07/10 126/82

## 2011-04-07 NOTE — Assessment & Plan Note (Signed)
Followed by Wound care center. Likelly taking flucanazole but not patient is not sure and he did not bring his medication with him. Uses Penlac solution. D/c ketoconazole due possible side effect per patient.

## 2011-04-14 LAB — URINALYSIS, ROUTINE W REFLEX MICROSCOPIC
Bilirubin Urine: NEGATIVE
Hgb urine dipstick: NEGATIVE
Nitrite: NEGATIVE
Protein, ur: NEGATIVE
Specific Gravity, Urine: 1.024
Urobilinogen, UA: 1

## 2011-04-14 LAB — CULTURE, BLOOD (ROUTINE X 2): Culture: NO GROWTH

## 2011-04-14 LAB — URINE CULTURE: Special Requests: 92

## 2011-04-14 LAB — DIFFERENTIAL
Basophils Absolute: 0
Basophils Relative: 0
Eosinophils Absolute: 0
Eosinophils Relative: 1
Lymphs Abs: 1.6
Neutrophils Relative %: 68

## 2011-04-14 LAB — BASIC METABOLIC PANEL
BUN: 14
Calcium: 8.5
Creatinine, Ser: 1.13
GFR calc Af Amer: >60
GFR calc non Af Amer: >60

## 2011-04-14 LAB — CBC
HCT: 46
MCHC: 32.7
MCV: 76.5 — ABNORMAL LOW
Platelets: 134 — ABNORMAL LOW
RDW: 15.7 — ABNORMAL HIGH

## 2011-05-04 ENCOUNTER — Ambulatory Visit (INDEPENDENT_AMBULATORY_CARE_PROVIDER_SITE_OTHER): Payer: PRIVATE HEALTH INSURANCE | Admitting: Internal Medicine

## 2011-05-04 ENCOUNTER — Encounter: Payer: Self-pay | Admitting: Internal Medicine

## 2011-05-04 VITALS — BP 123/81 | HR 65 | Temp 97.6°F | Ht 73.0 in | Wt 220.0 lb

## 2011-05-04 DIAGNOSIS — F209 Schizophrenia, unspecified: Secondary | ICD-10-CM

## 2011-05-04 DIAGNOSIS — R079 Chest pain, unspecified: Secondary | ICD-10-CM

## 2011-05-04 DIAGNOSIS — E119 Type 2 diabetes mellitus without complications: Secondary | ICD-10-CM

## 2011-05-04 DIAGNOSIS — F17200 Nicotine dependence, unspecified, uncomplicated: Secondary | ICD-10-CM

## 2011-05-04 DIAGNOSIS — I10 Essential (primary) hypertension: Secondary | ICD-10-CM

## 2011-05-04 DIAGNOSIS — B351 Tinea unguium: Secondary | ICD-10-CM

## 2011-05-04 DIAGNOSIS — Z23 Encounter for immunization: Secondary | ICD-10-CM

## 2011-05-04 DIAGNOSIS — G4700 Insomnia, unspecified: Secondary | ICD-10-CM

## 2011-05-04 LAB — BASIC METABOLIC PANEL
Calcium: 10 mg/dL (ref 8.4–10.5)
Glucose, Bld: 102 mg/dL — ABNORMAL HIGH (ref 70–99)
Sodium: 141 mEq/L (ref 135–145)

## 2011-05-04 NOTE — Assessment & Plan Note (Signed)
Sleep schedule still poor especially "when stressed".  Will sleep well on some days and then other days awakens in the middle of the night.  Patient was advised not to get up and drink coffee in the middle of the night when he awakens.  Reports resting well but not having deep sleep.

## 2011-05-04 NOTE — Assessment & Plan Note (Signed)
Counseled on cessation.  Pt given 1-800-QUIT-NOW. Patient reports not ready to stop yet.

## 2011-05-04 NOTE — Assessment & Plan Note (Addendum)
Stable at this point on Prolixin per patient report but continues to have "chatter voices" in his head especially when he doesn't sleep well.  Patient reports some episodes of anger but denies SI/HI.  Reports great involvement in church.  Patient to f/u with [facility] next Tuesday, May 09, 2011.

## 2011-05-04 NOTE — Assessment & Plan Note (Signed)
Stable, last HgbA1c 6.5.  Pt reports walking for exercise but with foot wounds has decreased his regimen.

## 2011-05-04 NOTE — Assessment & Plan Note (Signed)
Stable, reports one episode of brief chest pain lasting less than a minute.

## 2011-05-04 NOTE — Progress Notes (Signed)
  Subjective:    Patient ID: Wayne Schmidt, male    DOB: 1955/10/05, 55 y.o.   MRN: [id number]  HPI  Wayne Schmidt presents today for bp check and BMET since increasing Enalapril-HCTZ to 10/25 due to increase microalbuminuria.  He reports continued smoking of 2 packs per day.  States that this is due to using cigarettes more as a pass time.  Reports continued issues with sleep. He reports toe wound is just about fully healed.  Review of Systems  Constitutional: Negative.   HENT: Negative.   Eyes: Negative.   Respiratory: Negative for cough, chest tightness and shortness of breath.   Cardiovascular: Positive for chest pain.       Reports a brief episode of left chest pain last night which lasted "less than a minute".  Denies sob, radiation, diaphoresis.  Musculoskeletal: Negative for myalgias, back pain and joint swelling.  Neurological: Negative for dizziness.  Psychiatric/Behavioral: Positive for hallucinations. Negative for dysphoric mood.       Reports auditory hallucinations which are stable per his report.  No dominate voice but rather background chatter. Denies SI/HI.       Objective:   Physical Exam  Constitutional: He is oriented to person, place, and time. He appears well-developed and well-nourished. No distress.  HENT:  Head: Normocephalic and atraumatic.  Mouth/Throat: No oropharyngeal exudate.  Eyes: Pupils are equal, round, and reactive to light.       Murky sclera  Neck: Neck supple. No JVD present.  Cardiovascular: Normal rate, regular rhythm, normal heart sounds and intact distal pulses.   No murmur heard. Pulmonary/Chest: Effort normal and breath sounds normal. No respiratory distress. He exhibits no tenderness.  Lymphadenopathy:    He has no cervical adenopathy.  Neurological: He is alert and oriented to person, place, and time.  Skin: Skin is warm and dry.  Psychiatric: He has a normal mood and affect. Thought content normal.          Assessment & Plan:

## 2011-05-04 NOTE — Patient Instructions (Addendum)
It was nice to meet you, Wayne Schmidt.  Your blood pressure responded well with the increase in your blood pressure medication. We will check your electrolytes and kidney function with blood work today. Please consider decreasing your cigarette smoking.  Call 1-800-QUIT-NOW to assist with stopping smoking.  Continue to take your other medications and keep your appointments with [facility] and the Wound Specialist for your toe wound. You received the Tetanus shot booster and Pneumonia shot today. Please come back in 4 months for further evaluation.  If you have further chest pain please call the clinic for further evaluation.

## 2011-05-04 NOTE — Assessment & Plan Note (Signed)
Stable, continues to be managed by Wound Care.  Using the antifungals currently but feels the Vicks vapor rub may work better.  Will f/u next Monday, May 08, 2011 with Wound Care.

## 2011-05-04 NOTE — Assessment & Plan Note (Signed)
Under adequate control today with increased dosage of enalapril-HCTZ.  Will continue to monitor and f/u 3-4 months.

## 2011-05-05 NOTE — Progress Notes (Signed)
agree with plans and note. 

## 2011-05-08 ENCOUNTER — Encounter (HOSPITAL_BASED_OUTPATIENT_CLINIC_OR_DEPARTMENT_OTHER): Payer: PRIVATE HEALTH INSURANCE | Attending: Internal Medicine

## 2011-05-08 DIAGNOSIS — E1169 Type 2 diabetes mellitus with other specified complication: Secondary | ICD-10-CM | POA: Insufficient documentation

## 2011-05-08 DIAGNOSIS — L97509 Non-pressure chronic ulcer of other part of unspecified foot with unspecified severity: Secondary | ICD-10-CM | POA: Insufficient documentation

## 2011-05-09 ENCOUNTER — Encounter (HOSPITAL_BASED_OUTPATIENT_CLINIC_OR_DEPARTMENT_OTHER): Payer: PRIVATE HEALTH INSURANCE

## 2011-05-31 ENCOUNTER — Other Ambulatory Visit: Payer: Self-pay | Admitting: Internal Medicine

## 2011-08-30 ENCOUNTER — Other Ambulatory Visit: Payer: Self-pay | Admitting: Internal Medicine

## 2011-09-01 ENCOUNTER — Other Ambulatory Visit: Payer: Self-pay | Admitting: Internal Medicine

## 2011-09-21 ENCOUNTER — Ambulatory Visit (INDEPENDENT_AMBULATORY_CARE_PROVIDER_SITE_OTHER): Payer: PRIVATE HEALTH INSURANCE | Admitting: Internal Medicine

## 2011-09-21 ENCOUNTER — Encounter: Payer: Self-pay | Admitting: Internal Medicine

## 2011-09-21 VITALS — BP 136/86 | HR 79 | Temp 97.2°F | Ht 73.0 in | Wt 231.9 lb

## 2011-09-21 DIAGNOSIS — E119 Type 2 diabetes mellitus without complications: Secondary | ICD-10-CM

## 2011-09-21 DIAGNOSIS — I10 Essential (primary) hypertension: Secondary | ICD-10-CM

## 2011-09-21 DIAGNOSIS — G4700 Insomnia, unspecified: Secondary | ICD-10-CM

## 2011-09-21 DIAGNOSIS — E785 Hyperlipidemia, unspecified: Secondary | ICD-10-CM

## 2011-09-21 DIAGNOSIS — Z79899 Other long term (current) drug therapy: Secondary | ICD-10-CM

## 2011-09-21 DIAGNOSIS — F209 Schizophrenia, unspecified: Secondary | ICD-10-CM

## 2011-09-21 DIAGNOSIS — R079 Chest pain, unspecified: Secondary | ICD-10-CM

## 2011-09-21 DIAGNOSIS — F17200 Nicotine dependence, unspecified, uncomplicated: Secondary | ICD-10-CM

## 2011-09-21 LAB — GLUCOSE, CAPILLARY: Glucose-Capillary: 134 mg/dL — ABNORMAL HIGH (ref 70–99)

## 2011-09-21 LAB — POCT GLYCOSYLATED HEMOGLOBIN (HGB A1C): Hemoglobin A1C: 6.5

## 2011-09-21 NOTE — Assessment & Plan Note (Addendum)
On progress statin 40 mg daily, LDL of 90 at goal, Next Lipid Panel due June 2013

## 2011-09-21 NOTE — Assessment & Plan Note (Signed)
Continues to abuse cigarettes, smoking between 1 1/2-2 packs per day. He reports this is his form of medication. He is aware of available resources including 1 800 quit now.

## 2011-09-21 NOTE — Assessment & Plan Note (Signed)
Reports occasional left-sided chest pressure which lasts for seconds and resolves without intervention, reports that he can go months without chest pain, reports having a negative treadmill stress test per [facility] cardiology in "early 2000s". Patient advised if chest pain increases or he become short of breath to seek evaluation per the clinic or emergency department, especially given that it has been over 10 years since last heart evaluation. Also stressed to patient the importance of ceasing cigarette smoking.

## 2011-09-21 NOTE — Assessment & Plan Note (Signed)
Stable without medical therapy. Patient reports when he awakens in the early hours of the morning this is his best time to think thus is no longer a problem.

## 2011-09-21 NOTE — Assessment & Plan Note (Addendum)
Diet controlled, Stable with Hgb A1c 6.5 today which is unchanged from last hemoglobin A1c November 2012. Diabetic Foot Exam performed today.

## 2011-09-21 NOTE — Patient Instructions (Signed)
It was nice to see you again, Mr. Schulke.  Your blood pressure and diabetes is under good control. I will clarify your blood pressure medication with the pharmacy. You are to continue taking enalapril-hydrochlorothiazide 10-25 mg per day. You may take 2 of the 5-12.5 mg per day until you have finished the bottle. Your hemoglobin A1c was 6.5 today.  This is unchanged from November. As discussed, it is important to try to cut down on your cigarette smoking. This is likely contributing to your occasional chest pain. If the chest pain increases, or you get short of breath,  please contact the clinic. I would like to see you back in 4 months.

## 2011-09-21 NOTE — Assessment & Plan Note (Signed)
Stable and controlled on Prolixin. Mood auditory hallucinations described as "background chatter". Followed by [facility] mental health, last seen 09/12/11, next appointment 09/26/11

## 2011-09-21 NOTE — Progress Notes (Deleted)
Subjective:     Patient ID: Wayne Schmidt, male   DOB: [birth year], 57 y.o.   MRN: [id number]  HPI Pt presents for f/u of hypertension, diabetes mellitus, tobacco abuse, schizophrenia and intermittent chest pain. Hasn't noticed much chest pain but may get a pressure occasionally ~3-4 times a month not associated with activity, sob, diaphoresis, no radiation.  Review of Systems  Constitutional: Negative for fatigue.  HENT: Negative for congestion.   Eyes: Negative for visual disturbance.  Respiratory: Negative for cough and shortness of breath.   Cardiovascular: Positive for chest pain. Negative for leg swelling.  Genitourinary: Negative for dysuria.  Musculoskeletal: Positive for back pain. Negative for arthralgias.  Neurological: Negative for headaches.       Objective:   Physical Exam     Assessment:     ***    Plan:     ***

## 2011-09-21 NOTE — Assessment & Plan Note (Signed)
136/86 with pulse 79. Patient to continue enalapril-hydrochlorothiazide 10-25 mg daily. Of note there was some confusion with his prescription as he had the 5-12.5. This has been discontinued. The patient was advised to take 2 of the 5-12.5 mg until he finished the bottle and the refill will be the increased dosage of 10-12.5 mg daily.

## 2011-09-21 NOTE — Progress Notes (Signed)
Subjective:     Patient ID: Wayne Schmidt, male   DOB: Nov 01, 1955, 56 y.o.   MRN: [id number]  HPI  Mr. Misko is a 56 year old African-American male who presents for followup of diabetes, hypertension, hyperlipidemia, tobacco abuse and schizophrenia .  He reports doing well and is without without specific complaints today. On query, he does report that he has occasional chest pressure which last for seconds and this resolves on its own.  He states that he can go a month or so without any episodes and that the pressure does not radiate, is not associated with diaphoresis, nausea, or shortness of breath. He also reports that he continues to smoke up to 2 packs cigarettes per day and is not interested in quitting.    Review of Systems  Constitutional: Negative for fatigue.  HENT: Negative for congestion.   Eyes: Negative for visual disturbance.  Respiratory: Negative for cough and shortness of breath.   Cardiovascular: Positive for chest pain and leg swelling.  Genitourinary: Negative for dysuria and hematuria.  Musculoskeletal: Negative for back pain and arthralgias.  Neurological: Negative for seizures, syncope, weakness, light-headedness and headaches.  Psychiatric/Behavioral: Positive for hallucinations. Negative for behavioral problems, confusion, dysphoric mood, decreased concentration and agitation.       Objective:   Physical Exam  Constitutional: He is oriented to person, place, and time. He appears well-developed and well-nourished. No distress.  HENT:  Head: Normocephalic and atraumatic.  Eyes: Conjunctivae and EOM are normal. Pupils are equal, round, and reactive to light.  Neck: Normal range of motion. Neck supple.  Cardiovascular: Normal rate, regular rhythm, normal heart sounds and intact distal pulses.   Pulmonary/Chest: Effort normal and breath sounds normal.  Abdominal: Soft. Bowel sounds are normal.  Musculoskeletal: Normal range of motion. He exhibits no edema.    Neurological: He is alert and oriented to person, place, and time.  Skin: Skin is warm and dry.  Psychiatric: He has a normal mood and affect. His behavior is normal. Judgment and thought content normal.       Assessment:     #1 diabetes mellitus: Adequate control, hemoglobin A1c 6.5  #2 hypertension: Adequate control BP 136/86 pulse 79, on enalapril-hydrochlorothiazide 10-25 mg daily  #3 schizophrenia: Stable on Prolixin  #4 chest pain: Intermittent and stable      Plan:     See problem list

## 2011-09-22 ENCOUNTER — Encounter: Payer: Self-pay | Admitting: Internal Medicine

## 2011-09-24 ENCOUNTER — Emergency Department (HOSPITAL_COMMUNITY)
Admission: EM | Admit: 2011-09-24 | Discharge: 2011-09-24 | Disposition: A | Discharge status: Home / Self Care | Payer: PRIVATE HEALTH INSURANCE | Attending: Emergency Medicine | Admitting: Emergency Medicine

## 2011-09-24 ENCOUNTER — Encounter (HOSPITAL_COMMUNITY): Payer: Self-pay | Admitting: Emergency Medicine

## 2011-09-24 ENCOUNTER — Emergency Department (HOSPITAL_COMMUNITY): Payer: PRIVATE HEALTH INSURANCE

## 2011-09-24 DIAGNOSIS — F17200 Nicotine dependence, unspecified, uncomplicated: Secondary | ICD-10-CM | POA: Insufficient documentation

## 2011-09-24 DIAGNOSIS — J069 Acute upper respiratory infection, unspecified: Secondary | ICD-10-CM

## 2011-09-24 DIAGNOSIS — R0602 Shortness of breath: Secondary | ICD-10-CM | POA: Insufficient documentation

## 2011-09-24 DIAGNOSIS — Z8659 Personal history of other mental and behavioral disorders: Secondary | ICD-10-CM | POA: Insufficient documentation

## 2011-09-24 DIAGNOSIS — J3489 Other specified disorders of nose and nasal sinuses: Secondary | ICD-10-CM | POA: Insufficient documentation

## 2011-09-24 DIAGNOSIS — I10 Essential (primary) hypertension: Secondary | ICD-10-CM | POA: Insufficient documentation

## 2011-09-24 LAB — CBC
MCH: 25.5 pg — ABNORMAL LOW (ref 26.0–34.0)
Platelets: 164 10*3/uL (ref 150–400)
RBC: 5.88 MIL/uL — ABNORMAL HIGH (ref 4.22–5.81)

## 2011-09-24 LAB — POCT I-STAT, CHEM 8
Calcium, Ion: 1.21 mmol/L (ref 1.12–1.32)
Chloride: 107 mEq/L (ref 96–112)
HCT: 49 % (ref 39.0–52.0)
Sodium: 142 mEq/L (ref 135–145)
TCO2: 24 mmol/L (ref 0–100)

## 2011-09-24 LAB — TROPONIN I: Troponin I: <0.3 ng/mL (ref <0.30)

## 2011-09-24 MED ORDER — CHLORPHENIRAMINE-ACETAMINOPHEN 2-325 MG PO TABS: 1.0000 | ORAL_TABLET | Freq: Three times a day (TID) | ORAL | Status: DC

## 2011-09-24 NOTE — ED Notes (Signed)
Bed:WA16<BR> Expected date:<BR> Expected time:<BR> Means of arrival:<BR> Comments:<BR> EMS

## 2011-09-24 NOTE — ED Provider Notes (Signed)
History     CSN: [id number]  Arrival date & time 09/24/11  0026   First MD Initiated Contact with Patient 09/24/11 [phone]      Chief Complaint  Patient presents with  . Cough  . Nasal Congestion  . Shortness of Breath    (Consider location/radiation/quality/duration/timing/severity/associated sxs/prior treatment) HPI Comments: Patient states that 2 days ago, he developed rhinitis, and nonproductive cough that has kept him from sleeping tonight.  He tried sleeping and again could not sleep due to the congestion, postnasal drip, and a cough, which causes some discomfort in his chest  Patient is a 56 y.o. male presenting with cough and shortness of breath. The history is provided by the patient.  Cough This is a new problem. The current episode started yesterday. The problem occurs constantly. The cough is non-productive. There has been no fever. Associated symptoms include chest pain, rhinorrhea and shortness of breath. Pertinent negatives include no chills, no ear congestion, no headaches, no sore throat, no wheezing and no eye redness.  Shortness of Breath  Associated symptoms include chest pain, rhinorrhea, cough and shortness of breath. Pertinent negatives include no fever, no sore throat and no wheezing.    Past Medical History  Diagnosis Date  . Hypertension   . Schizophrenia   . Microcytosis   . Substance abuse     tobacco use  . High risk sexual behavior 10/2007    Past Surgical History  Procedure Date  . Laminectomy     by Dr Elesa Hacker    Family History  Problem Relation Age of Onset  . Diabetes Mother   . Hypertension Mother   . Lupus Sister   . Sickle cell anemia Brother     History  Substance Use Topics  . Smoking status: Current Everyday Smoker -- 2.0 packs/day for 38 years    Types: Cigarettes  . Smokeless tobacco: Not on file  . Alcohol Use: No      Review of Systems  Constitutional: Negative for fever and chills.  HENT: Positive for congestion,  rhinorrhea and postnasal drip. Negative for sore throat.   Eyes: Negative for redness.  Respiratory: Positive for cough and shortness of breath. Negative for wheezing.   Cardiovascular: Positive for chest pain. Negative for palpitations and leg swelling.  Gastrointestinal: Negative for vomiting.  Neurological: Negative for dizziness and headaches.    Allergies  Atorvastatin; Chlorpromazine hcl; and Simvastatin  Home Medications   Current Outpatient Rx  Name Route Sig Dispense Refill  . CICLOPIROX 8 % EX SOLN Topical Apply topically at bedtime. Apply over nail and surrounding skin. Apply daily over previous coat. After seven (7) days, may remove with alcohol and continue cycle.     Marland Kitchen DOXAZOSIN MESYLATE 2 MG PO TABS  TAKE 2 TABLETS DAILY 60 tablet 2  . ENALAPRIL-HYDROCHLOROTHIAZIDE 10-25 MG PO TABS Oral Take 1 tablet by mouth daily. 30 tablet 2  . FLUPHENAZINE DECANOATE 25 MG/ML IJ SOLN Intramuscular Inject into the muscle every 14 (fourteen) days. Inject 1.25cc of 25mg /ml solution every 2 weeks     . OLOPATADINE HCL 0.1 % OP SOLN Both Eyes Place 1 drop into both eyes 2 (two) times daily.      Marland Kitchen PRAVASTATIN SODIUM 40 MG PO TABS  TAKE 1 TABLET BY MOUTH EVERY DAY 30 tablet 3  . CHLORPHENIRAMINE-ACETAMINOPHEN 2-325 MG PO TABS Oral Take 1 tablet by mouth 3 (three) times daily. 30 each 0    BP 155/88  Pulse 77  Temp(Src) 98.3 F (  36.8 C) (Oral)  Resp 27  Wt 231 lb (104.781 kg)  SpO2 96%  Physical Exam  Constitutional: He is oriented to person, place, and time. He appears well-developed and well-nourished.  HENT:  Head: Normocephalic.  Eyes: Pupils are equal, round, and reactive to light.  Neck: Normal range of motion. Neck supple.  Cardiovascular: Normal rate and regular rhythm.   Pulmonary/Chest: Effort normal and breath sounds normal. He has no wheezes. He exhibits no tenderness.  Abdominal: Soft.  Musculoskeletal: Normal range of motion. He exhibits no edema and no tenderness.    Neurological: He is alert and oriented to person, place, and time.  Skin: Skin is warm.    ED Course  Procedures (including critical care time)  Labs Reviewed  CBC - Abnormal; Notable for the following:    WBC 13.6 (*)    RBC 5.88 (*)    MCV 76.2 (*)    MCH 25.5 (*)    All other components within normal limits  POCT I-STAT, CHEM 8 - Abnormal; Notable for the following:    Glucose, Bld 178 (*)    All other components within normal limits  TROPONIN I   Dg Chest 2 View  09/24/2011  *RADIOLOGY REPORT*  Clinical Data: Cough.  Chest pain.  Hypertension.  CHEST - 2 VIEW  Comparison:  11/30/2009  Findings:  The heart size and mediastinal contours are within normal limits.  Both lungs are clear.  The visualized skeletal structures are unremarkable.  IMPRESSION: No active cardiopulmonary disease.  Original Report Authenticated By: Danae Orleans, M.D.     1. URI, acute       MDM  URI symptoms with nonproductive cough.  Patient does have a significant history for hypertension, diabetes, and schizophrenia.  Will check chest x-ray, labs        Arman Filter, NP 09/24/11 0157  Arman Filter, NP 09/24/11 [phone]

## 2011-09-24 NOTE — ED Provider Notes (Signed)
Medical screening examination/treatment/procedure(s) were performed by non-physician practitioner and as supervising physician I was immediately available for consultation/collaboration.   Seward Coran, MD 09/24/11 0655 

## 2011-09-24 NOTE — ED Notes (Signed)
Patient given discharge instructions, information, prescriptions, and diet order. Patient states that they adequately understand discharge information given and to return to ED if symptoms return or worsen.     

## 2011-09-24 NOTE — ED Notes (Signed)
Pt reports that sx stated today, earlier in the day, and this am is going on the second day battling with these sx, pt reports nasal congestion, shob, chest pain, bilateral, denies radiation of pain, 1/10, intermittent.  Pt states that he took OTC for congestion, no relief at this time. Pt placed on the monitor, NSR, HR 94, O2 94% RA, minimal labored breathing noted

## 2011-09-24 NOTE — ED Notes (Signed)
Per EMS: pt brought from home, c/o cough and Shob, sx for past two days, pt also smoker, 2 pack s a day, states was trying OTC, mucinex, however, no relief noted. Lungs sounds clear in all lobes, no wheezing.

## 2011-09-24 NOTE — ED Notes (Signed)
MD/NP at bedside.

## 2011-09-30 ENCOUNTER — Emergency Department (HOSPITAL_COMMUNITY)
Admission: EM | Admit: 2011-09-30 | Discharge: 2011-10-01 | Disposition: A | Discharge status: Home / Self Care | Payer: PRIVATE HEALTH INSURANCE | Attending: Emergency Medicine | Admitting: Emergency Medicine

## 2011-09-30 ENCOUNTER — Encounter (HOSPITAL_COMMUNITY): Payer: Self-pay | Admitting: Emergency Medicine

## 2011-09-30 DIAGNOSIS — H6690 Otitis media, unspecified, unspecified ear: Secondary | ICD-10-CM

## 2011-09-30 DIAGNOSIS — Z888 Allergy status to other drugs, medicaments and biological substances status: Secondary | ICD-10-CM | POA: Insufficient documentation

## 2011-09-30 DIAGNOSIS — I10 Essential (primary) hypertension: Secondary | ICD-10-CM | POA: Insufficient documentation

## 2011-09-30 DIAGNOSIS — F17200 Nicotine dependence, unspecified, uncomplicated: Secondary | ICD-10-CM | POA: Insufficient documentation

## 2011-09-30 DIAGNOSIS — F209 Schizophrenia, unspecified: Secondary | ICD-10-CM | POA: Insufficient documentation

## 2011-09-30 DIAGNOSIS — J029 Acute pharyngitis, unspecified: Secondary | ICD-10-CM | POA: Insufficient documentation

## 2011-09-30 NOTE — ED Notes (Signed)
Pt's daughter st's she is going home wants to be called by pt when he is discharged and she will come back and pick him up.  Pt made aware of same and voices understanding.

## 2011-09-30 NOTE — ED Notes (Signed)
PT. REPORTS SORE THROAT AND RIGHT EAR ACHE FOR SEVERAL DAYS . NO COUGH OR FEVER .

## 2011-10-01 MED ORDER — PENICILLIN G BENZATHINE 1200000 UNIT/2ML IM SUSP
1.2000 10*6.[IU] | Freq: Once | INTRAMUSCULAR | Status: AC
  Administered 2011-10-01: 1.2 10*6.[IU] via INTRAMUSCULAR
  Filled 2011-10-01: qty 2

## 2011-10-01 MED ORDER — ACETAMINOPHEN-CODEINE 120-12 MG/5ML PO SOLN: 5.0000 mL | Freq: Four times a day (QID) | ORAL | Status: AC | PRN

## 2011-10-01 MED ORDER — ACETAMINOPHEN-CODEINE 120-12 MG/5ML PO SOLN
5.0000 mL | Freq: Once | ORAL | Status: AC
  Administered 2011-10-01: 10 mL via ORAL
  Filled 2011-10-01: qty 10

## 2011-10-01 NOTE — ED Notes (Signed)
Note sent to pharmacy to send meds to [facility].

## 2011-10-01 NOTE — ED Notes (Signed)
Patient is AOx4 and comfortable with his discharge instructions. 

## 2011-10-01 NOTE — ED Provider Notes (Signed)
History     CSN: [id number]  Arrival date & time 09/30/11  2115   First MD Initiated Contact with Patient 10/01/11 0007      Chief Complaint  Patient presents with  . Sore Throat    (Consider location/radiation/quality/duration/timing/severity/associated sxs/prior treatment) HPI Comments: Patient here with right ear pain and sore throat - states had a cold last week and he thinks this may have moved into his ear and throat - denies fever, chills, drainage from the ear, reports pain with swallowing without inability to swallow - denies cough, congestion.  Patient is a 56 y.o. male presenting with pharyngitis. The history is provided by the patient. No language interpreter was used.  Sore Throat This is a new problem. The current episode started in the past 7 days. The problem occurs constantly. The problem has been unchanged. Associated symptoms include congestion, a sore throat and swollen glands. Pertinent negatives include no abdominal pain, anorexia, arthralgias, change in bowel habit, chest pain, chills, coughing, diaphoresis, fatigue, fever, headaches, joint swelling, myalgias, nausea, neck pain, numbness, rash, urinary symptoms, vertigo, visual change, vomiting or weakness. The symptoms are aggravated by swallowing. He has tried nothing for the symptoms. The treatment provided no relief.    Past Medical History  Diagnosis Date  . Hypertension   . Schizophrenia   . Microcytosis   . Substance abuse     tobacco use  . High risk sexual behavior 10/2007    Past Surgical History  Procedure Date  . Laminectomy     by Dr Elesa Hacker    Family History  Problem Relation Age of Onset  . Diabetes Mother   . Hypertension Mother   . Lupus Sister   . Sickle cell anemia Brother     History  Substance Use Topics  . Smoking status: Current Everyday Smoker -- 2.0 packs/day for 38 years    Types: Cigarettes  . Smokeless tobacco: Not on file  . Alcohol Use: No      Review of  Systems  Constitutional: Negative for fever, chills, diaphoresis and fatigue.  HENT: Positive for congestion and sore throat. Negative for neck pain.   Respiratory: Negative for cough.   Cardiovascular: Negative for chest pain.  Gastrointestinal: Negative for nausea, vomiting, abdominal pain, anorexia and change in bowel habit.  Musculoskeletal: Negative for myalgias, joint swelling and arthralgias.  Skin: Negative for rash.  Neurological: Negative for vertigo, weakness, numbness and headaches.  All other systems reviewed and are negative.    Allergies  Atorvastatin; Chlorpromazine hcl; and Simvastatin  Home Medications   Current Outpatient Rx  Name Route Sig Dispense Refill  . CHLORPHENIRAMINE-ACETAMINOPHEN 2-325 MG PO TABS Oral Take 1 tablet by mouth 3 (three) times daily. 30 each 0  . CICLOPIROX 8 % EX SOLN Topical Apply topically at bedtime. Apply over nail and surrounding skin. Apply daily over previous coat. After seven (7) days, may remove with alcohol and continue cycle.     Marland Kitchen DOXAZOSIN MESYLATE 2 MG PO TABS  TAKE 2 TABLETS DAILY 60 tablet 2  . ENALAPRIL-HYDROCHLOROTHIAZIDE 10-25 MG PO TABS Oral Take 1 tablet by mouth daily. 30 tablet 2  . FLUPHENAZINE DECANOATE 25 MG/ML IJ SOLN Intramuscular Inject into the muscle every 14 (fourteen) days. Inject 1.25cc of 25mg /ml solution every 2 weeks     . OLOPATADINE HCL 0.1 % OP SOLN Both Eyes Place 1 drop into both eyes 2 (two) times daily.      Marland Kitchen PHENOL 1.4 % MT LIQD Mouth/Throat Use  as directed 1 spray in the mouth or throat as needed. For throat pain    . PRAVASTATIN SODIUM 40 MG PO TABS  TAKE 1 TABLET BY MOUTH EVERY DAY 30 tablet 3    BP 126/80  Pulse 76  Temp(Src) 97.9 F (36.6 C) (Oral)  Resp 18  SpO2 96%  Physical Exam  Nursing note and vitals reviewed. Constitutional: He is oriented to person, place, and time. He appears well-developed and well-nourished. No distress.  HENT:  Head: Normocephalic and atraumatic.  Right  Ear: Tympanic membrane is erythematous. A middle ear effusion is present.  Left Ear: Tympanic membrane, external ear and ear canal normal.  Nose: Nose normal.  Mouth/Throat: Posterior oropharyngeal erythema present. No oropharyngeal exudate or posterior oropharyngeal edema.  Eyes: Conjunctivae are normal. Pupils are equal, round, and reactive to light. No scleral icterus.  Neck: Normal range of motion. Neck supple.  Cardiovascular: Normal rate, regular rhythm and normal heart sounds.  Exam reveals no gallop and no friction rub.   No murmur heard. Pulmonary/Chest: Effort normal and breath sounds normal. No respiratory distress. He has no wheezes. He has no rales. He exhibits no tenderness.  Abdominal: Soft. Bowel sounds are normal. He exhibits no distension. There is no tenderness.  Musculoskeletal: Normal range of motion. He exhibits no edema and no tenderness.  Lymphadenopathy:    He has no cervical adenopathy.  Neurological: He is alert and oriented to person, place, and time. No cranial nerve deficit.  Skin: Skin is warm and dry. No rash noted. No erythema. No pallor.  Psychiatric: He has a normal mood and affect. His behavior is normal. Judgment and thought content normal.    ED Course  Procedures (including critical care time)  Labs Reviewed - No data to display No results found.   Right Otitis media pharyngitis    MDM  Patient with right OM and likely not strep throat - will be treating the OM with bicillin LA which will cover strep as well - will give short course of liquid pain medication until this ends.        Izola Price [place], [place] 10/01/11 [phone]

## 2011-10-01 NOTE — Discharge Instructions (Signed)
Otitis Media, Adult A middle ear infection is an infection in the space behind the eardrum. It often happens along with a cold. It is caused by a germ that starts growing in that space. Your neck may feel puffy (swollen) on the side of the ear infection. HOME CARE  Take your medicine as told. Finish it even if you start to feel better.   Nose medicine (nasal decongestant) may help the tube that connects the ear and throat (eustachian tube) drain better. It may also help with discomfort.   Follow up with your doctor in 10 to 14 days or as told by your doctor. This is to make sure the infection is gone.  GET HELP RIGHT AWAY IF:   You do not start to feel better in 2 to 3 days.   You have pain that is not helped with medicine.   You cannot use the medicine as told.   You feel worse instead of better.   You develop puffiness, redness, or pain around the ear.   You get a stiff neck.  MAKE SURE YOU:   Understand these instructions.   Will watch your condition.   Will get help right away if you are not doing well or get worse.  Document Released: 12/06/2007 Document Revised: 06/08/2011 Document Reviewed: 12/06/2007 [facility] [address] Patient Information 2012 [place], [place].Pharyngitis, Viral and Bacterial Pharyngitis is soreness (inflammation) or infection of the pharynx. It is also called a sore throat. CAUSES  Most sore throats are caused by viruses and are part of a cold. However, some sore throats are caused by strep and other bacteria. Sore throats can also be caused by post nasal drip from draining sinuses, allergies and sometimes from sleeping with an open mouth. Infectious sore throats can be spread from person to person by coughing, sneezing and sharing cups or eating utensils. TREATMENT  Sore throats that are viral usually last 3-4 days. Viral illness will get better without medications (antibiotics). Strep throat and other bacterial infections will usually begin to get better about  24-48 hours after you begin to take antibiotics. HOME CARE INSTRUCTIONS   If the caregiver feels there is a bacterial infection or if there is a positive strep test, they will prescribe an antibiotic. The full course of antibiotics must be taken. If the full course of antibiotic is not taken, you or your child may become ill again. If you or your child has strep throat and do not finish all of the medication, serious heart or kidney diseases may develop.   Drink enough water and fluids to keep your urine clear or pale yellow.   Only take over-the-counter or prescription medicines for pain, discomfort or fever as directed by your caregiver.   Get lots of rest.   Gargle with salt water ( tsp. of salt in a glass of water) as often as every 1-2 hours as you need for comfort.   Hard candies may soothe the throat if individual is not at risk for choking. Throat sprays or lozenges may also be used.  SEEK MEDICAL CARE IF:   Large, tender lumps in the neck develop.   A rash develops.   Green, yellow-brown or bloody sputum is coughed up.   Your baby is older than 3 months with a rectal temperature of 100.5 F (38.1 C) or higher for more than 1 day.  SEEK IMMEDIATE MEDICAL CARE IF:   A stiff neck develops.   You or your child are drooling or unable to swallow  liquids.   You or your child are vomiting, unable to keep medications or liquids down.   You or your child has severe pain, unrelieved with recommended medications.   You or your child are having difficulty breathing (not due to stuffy nose).   You or your child are unable to fully open your mouth.   You or your child develop redness, swelling, or severe pain anywhere on the neck.   You have a fever.   Your baby is older than 3 months with a rectal temperature of 102 F (38.9 C) or higher.   Your baby is 80 months old or younger with a rectal temperature of 100.4 F (38 C) or higher.  MAKE SURE YOU:   Understand these  instructions.   Will watch your condition.   Will get help right away if you are not doing well or get worse.  Document Released: 06/19/2005 Document Revised: 06/08/2011 Document Reviewed: 09/16/2007 [facility] Patient Information 2012 [place], [place].Salt Water Gargle This solution will help make your mouth and throat feel better. HOME CARE INSTRUCTIONS   Mix 1 teaspoon of salt in 8 ounces of warm water.   Gargle with this solution as much or often as you need or as directed. Swish and gargle gently if you have any sores or wounds in your mouth.   Do not swallow this mixture.  Document Released: 03/23/2004 Document Revised: 06/08/2011 Document Reviewed: 08/14/2008 [facility] Patient Information 2012 [place], [place].

## 2011-10-02 NOTE — ED Provider Notes (Signed)
Medical screening examination/treatment/procedure(s) were performed by non-physician practitioner and as supervising physician I was immediately available for consultation/collaboration.   Aylin Rhoads M Azlaan Isidore, DO 10/02/11 0644 

## 2011-10-05 ENCOUNTER — Other Ambulatory Visit: Payer: Self-pay | Admitting: *Deleted

## 2011-10-05 MED ORDER — DOXAZOSIN MESYLATE 2 MG PO TABS: 2.0000 mg | ORAL_TABLET | Freq: Every day | ORAL | Status: DC

## 2011-10-05 NOTE — Telephone Encounter (Signed)
[facility] request for authorization to dispense 90 day supply. 

## 2011-10-06 MED ORDER — ENALAPRIL-HYDROCHLOROTHIAZIDE 10-25 MG PO TABS: 1.0000 | ORAL_TABLET | Freq: Every day | ORAL | Status: DC

## 2011-10-30 ENCOUNTER — Other Ambulatory Visit: Payer: Self-pay | Admitting: Internal Medicine

## 2012-01-01 ENCOUNTER — Ambulatory Visit (INDEPENDENT_AMBULATORY_CARE_PROVIDER_SITE_OTHER): Payer: PRIVATE HEALTH INSURANCE | Admitting: Internal Medicine

## 2012-01-01 ENCOUNTER — Encounter: Payer: Self-pay | Admitting: Internal Medicine

## 2012-01-01 VITALS — BP 86/65 | HR 11 | Temp 98.0°F | Ht 73.0 in | Wt 224.7 lb

## 2012-01-01 DIAGNOSIS — I10 Essential (primary) hypertension: Secondary | ICD-10-CM

## 2012-01-01 DIAGNOSIS — F17200 Nicotine dependence, unspecified, uncomplicated: Secondary | ICD-10-CM

## 2012-01-01 DIAGNOSIS — E119 Type 2 diabetes mellitus without complications: Secondary | ICD-10-CM

## 2012-01-01 DIAGNOSIS — G4700 Insomnia, unspecified: Secondary | ICD-10-CM

## 2012-01-01 DIAGNOSIS — E785 Hyperlipidemia, unspecified: Secondary | ICD-10-CM

## 2012-01-01 DIAGNOSIS — R079 Chest pain, unspecified: Secondary | ICD-10-CM

## 2012-01-01 DIAGNOSIS — K047 Periapical abscess without sinus: Secondary | ICD-10-CM

## 2012-01-01 DIAGNOSIS — F209 Schizophrenia, unspecified: Secondary | ICD-10-CM

## 2012-01-01 DIAGNOSIS — Z79899 Other long term (current) drug therapy: Secondary | ICD-10-CM

## 2012-01-01 LAB — POCT GLYCOSYLATED HEMOGLOBIN (HGB A1C): Hemoglobin A1C: 6.2

## 2012-01-01 LAB — LIPID PANEL
Cholesterol: 165 mg/dL (ref 0–200)
HDL: 43 mg/dL (ref >39)

## 2012-01-01 LAB — GLUCOSE, CAPILLARY: Glucose-Capillary: 122 mg/dL — ABNORMAL HIGH (ref 70–99)

## 2012-01-01 MED ORDER — ENALAPRIL-HYDROCHLOROTHIAZIDE 5-12.5 MG PO TABS: 1.0000 | ORAL_TABLET | Freq: Every day | ORAL | Status: DC

## 2012-01-01 NOTE — Patient Instructions (Addendum)
Your blood sugar levels show good control. Your blood pressure was a little low today. I will decrease your blood pressure medicine from 10/25 mg of Enalapril-HCTZ to 5/12.5 mg everyday. Keep taking all of your other medications as usual. Keep you appointment with [facility] and the Dentist. Return for a blood pressure check in 4 weeks.

## 2012-01-01 NOTE — Progress Notes (Signed)
Subjective:     Patient ID: Wayne Schmidt, male   DOB: [birth year], 56 y.o.   MRN: [id number]  HPI  Mr. Preble presents today for followup of his diabetes and hypertension. His blood pressure is mildly hypotensive at 99/65 and 86/65 on repeat with pulse in the 70s. Of note Mr. Iyer's antihypertensive regimen was increased at a prior visit for mildly elevated pressures. He reports having acute to with abscess that will be followed by Dr. Gala Romney (dentist). He states that his schizophrenia is controlled on fluphenazine as prescribed by his therapist at [facility] of which she sees every 2 weeks. He does continue to endorse voice is which he thinks is his subconscious. These voices do not tell him to hurt himself or others and are considered Norlene to the patient. He also reports mild visual disturbance where bathrooms look cloudy. These have been unchanged over the years. Of note Mr. Coate continues to smoke 1 1/2-2 packs of cigarettes per day. He denies increase chest pain from the fleeting discomfort that he's had over the years. He reports his chest pain lasts for a few seconds without radiation. Of note he denies syncope or presyncopal symptoms.  Review of Systems  Constitutional: Negative for fever and fatigue.  HENT: Negative for congestion.   Eyes: Negative for visual disturbance.  Respiratory: Negative for shortness of breath.   Cardiovascular: Positive for chest pain.       Sharp pain on left chest, no radiation, less "few seconds" and resolves spontaneously       Objective:   Physical Exam  Constitutional: He is oriented to person, place, and time. He appears well-developed and well-nourished. No distress.  HENT:  Head: Normocephalic and atraumatic.       Poor dentition  Eyes: Conjunctivae and EOM are normal. Pupils are equal, round, and reactive to light.  Neck: Normal range of motion. Neck supple.  Cardiovascular: Normal rate, regular rhythm, normal heart sounds and intact distal  pulses.   No murmur heard. Pulmonary/Chest: Breath sounds normal. No respiratory distress. He has no wheezes. He exhibits no tenderness.  Abdominal: Soft. Bowel sounds are normal. He exhibits no distension. There is no tenderness.  Musculoskeletal: Normal range of motion. He exhibits no edema.  Neurological: He is alert and oriented to person, place, and time.  Skin: Skin is warm and dry.  Psychiatric: He has a normal mood and affect. His behavior is normal. Judgment and thought content normal.       Assessment:     #1 diabetes mellitus type 2: Good control with diet, hemoglobin A1c 6.2  #2 hypertension: Slightly hypotensive today on increased dose of ACE inhibitor-thiazide #3 schizophrenia: Stable on Prolixin injection #4 tobacco abuse: Counseling receive the patient continues to smoke for stress relief #5 dental abscess: No signs of bleeding or drainage    Plan:     -Decrease enalapril-hydrochlorothiazide and 25-12.5 daily - recheck blood pressure in 3-4 weeks -Keep scheduled appointments with [facility] mental health -Keep appointment with the dentist Dr. Gala Romney

## 2012-01-03 ENCOUNTER — Other Ambulatory Visit: Payer: Self-pay | Admitting: Internal Medicine

## 2012-01-08 NOTE — Telephone Encounter (Signed)
This medication was prescribed in April at a different dose with additional refills - please confirm that pharmacy received the prescription.

## 2012-01-09 ENCOUNTER — Other Ambulatory Visit: Payer: Self-pay | Admitting: Internal Medicine

## 2012-01-09 NOTE — Telephone Encounter (Signed)
[facility]; pt does have refills.

## 2012-01-09 NOTE — Telephone Encounter (Signed)
[facility] - pt does have refills.

## 2012-02-05 ENCOUNTER — Encounter: Payer: Self-pay | Admitting: Internal Medicine

## 2012-02-05 ENCOUNTER — Ambulatory Visit (INDEPENDENT_AMBULATORY_CARE_PROVIDER_SITE_OTHER): Payer: PRIVATE HEALTH INSURANCE | Admitting: Internal Medicine

## 2012-02-05 VITALS — BP 115/81 | HR 81 | Temp 97.1°F | Ht 73.0 in | Wt 227.2 lb

## 2012-02-05 DIAGNOSIS — E119 Type 2 diabetes mellitus without complications: Secondary | ICD-10-CM

## 2012-02-05 DIAGNOSIS — Z87898 Personal history of other specified conditions: Secondary | ICD-10-CM

## 2012-02-05 DIAGNOSIS — F209 Schizophrenia, unspecified: Secondary | ICD-10-CM

## 2012-02-05 DIAGNOSIS — I10 Essential (primary) hypertension: Secondary | ICD-10-CM

## 2012-02-05 DIAGNOSIS — F17200 Nicotine dependence, unspecified, uncomplicated: Secondary | ICD-10-CM

## 2012-02-05 DIAGNOSIS — K047 Periapical abscess without sinus: Secondary | ICD-10-CM

## 2012-02-05 DIAGNOSIS — Z0000 Encounter for general adult medical examination without abnormal findings: Secondary | ICD-10-CM

## 2012-02-05 LAB — GLUCOSE, CAPILLARY: Glucose-Capillary: 89 mg/dL (ref 70–99)

## 2012-02-05 MED ORDER — DOXAZOSIN MESYLATE 2 MG PO TABS: 2.0000 mg | ORAL_TABLET | Freq: Every day | ORAL | Status: DC

## 2012-02-05 NOTE — Assessment & Plan Note (Addendum)
Evaluated by Dr. Gala Romney (Dentist) January 15, 2012. Will be getting a partial plate denture put in.

## 2012-02-05 NOTE — Patient Instructions (Addendum)
You blood pressure responded well to decreasing the enalapril.   We will continue with this current dosage of 5 mg/12.5 mg daily. I will like to see you in 5-6 months after the New Year.

## 2012-02-05 NOTE — Assessment & Plan Note (Addendum)
Complained of increased straining when taking the 4 mg of doxazosin but issues resolved when decreased to 2 mg.  Will continue to monitor and adjust prescription to 2 mg.

## 2012-02-05 NOTE — Assessment & Plan Note (Signed)
Blood pressure at goal with decrease back to enalapril-hctz 5/12.5mg .  Pt with hypotensive episodes on 10/25 mg qd.

## 2012-02-05 NOTE — Progress Notes (Signed)
  Subjective:    Patient ID: Wayne Schmidt, male    DOB: 11-01-1955, 56 y.o.   MRN: [id number]  HPI  Presents for f/u of bp after reducing dosage due to mild hypotension (90s sbp on previous visit). Today 115/81 with pulse of 81 bpm. No complaints other than he felt like he was having to strain when he urinates thus self-titrated his doxazosin from 4 mg to 2 mg.  Review of Systems  Constitutional: Negative for fatigue.  Eyes: Negative for visual disturbance.  Respiratory: Negative for chest tightness.   Cardiovascular: Negative for chest pain.  Genitourinary: Positive for difficulty urinating.  Neurological: Negative for dizziness, syncope, weakness and headaches.  Psychiatric/Behavioral: Positive for hallucinations. Negative for confusion, dysphoric mood and agitation.       Stable and followed by [facility]       Objective:   Physical Exam  Constitutional: He is oriented to person, place, and time. He appears well-developed and well-nourished. No distress.  HENT:  Head: Normocephalic and atraumatic.  Eyes: Conjunctivae and EOM are normal. Pupils are equal, round, and reactive to light.  Neck: Normal range of motion. Neck supple.  Cardiovascular: Normal rate, regular rhythm and normal heart sounds.   Pulmonary/Chest: Effort normal and breath sounds normal.  Abdominal: Soft. Bowel sounds are normal.  Musculoskeletal: Normal range of motion. He exhibits no edema.  Neurological: He is alert and oriented to person, place, and time.  Skin: Skin is warm and dry.  Psychiatric: He has a normal mood and affect. His behavior is normal. Judgment and thought content normal.          Assessment & Plan:

## 2012-02-05 NOTE — Assessment & Plan Note (Signed)
No plans to quit smoking. 

## 2012-04-11 ENCOUNTER — Ambulatory Visit (INDEPENDENT_AMBULATORY_CARE_PROVIDER_SITE_OTHER): Payer: PRIVATE HEALTH INSURANCE | Admitting: *Deleted

## 2012-04-11 DIAGNOSIS — Z23 Encounter for immunization: Secondary | ICD-10-CM

## 2012-04-28 ENCOUNTER — Emergency Department (HOSPITAL_COMMUNITY)
Admission: EM | Admit: 2012-04-28 | Discharge: 2012-04-28 | Disposition: A | Discharge status: Home / Self Care | Payer: PRIVATE HEALTH INSURANCE | Attending: Emergency Medicine | Admitting: Emergency Medicine

## 2012-04-28 ENCOUNTER — Emergency Department (HOSPITAL_COMMUNITY): Payer: PRIVATE HEALTH INSURANCE

## 2012-04-28 DIAGNOSIS — I10 Essential (primary) hypertension: Secondary | ICD-10-CM | POA: Insufficient documentation

## 2012-04-28 DIAGNOSIS — F209 Schizophrenia, unspecified: Secondary | ICD-10-CM | POA: Insufficient documentation

## 2012-04-28 DIAGNOSIS — N419 Inflammatory disease of prostate, unspecified: Secondary | ICD-10-CM

## 2012-04-28 DIAGNOSIS — Z7251 High risk heterosexual behavior: Secondary | ICD-10-CM | POA: Insufficient documentation

## 2012-04-28 DIAGNOSIS — K5900 Constipation, unspecified: Secondary | ICD-10-CM | POA: Insufficient documentation

## 2012-04-28 DIAGNOSIS — Z79899 Other long term (current) drug therapy: Secondary | ICD-10-CM | POA: Insufficient documentation

## 2012-04-28 DIAGNOSIS — R300 Dysuria: Secondary | ICD-10-CM | POA: Insufficient documentation

## 2012-04-28 DIAGNOSIS — F17200 Nicotine dependence, unspecified, uncomplicated: Secondary | ICD-10-CM | POA: Insufficient documentation

## 2012-04-28 DIAGNOSIS — D509 Iron deficiency anemia, unspecified: Secondary | ICD-10-CM | POA: Insufficient documentation

## 2012-04-28 LAB — CBC WITH DIFFERENTIAL/PLATELET
Basophils Absolute: 0 10*3/uL (ref 0.0–0.1)
Basophils Relative: 0 % (ref 0–1)
Hemoglobin: 14.4 g/dL (ref 13.0–17.0)
Lymphocytes Relative: 11 % — ABNORMAL LOW (ref 12–46)
MCHC: 34.1 g/dL (ref 30.0–36.0)
Neutro Abs: 18.9 10*3/uL — ABNORMAL HIGH (ref 1.7–7.7)
Neutrophils Relative %: 78 % — ABNORMAL HIGH (ref 43–77)
RDW: 15.2 % (ref 11.5–15.5)
WBC: 24.2 10*3/uL — ABNORMAL HIGH (ref 4.0–10.5)

## 2012-04-28 LAB — URINALYSIS, MICROSCOPIC ONLY
Glucose, UA: >1000 mg/dL — AB
Ketones, ur: NEGATIVE mg/dL
Specific Gravity, Urine: 1.04 — ABNORMAL HIGH (ref 1.005–1.030)
pH: 5.5 (ref 5.0–8.0)

## 2012-04-28 LAB — BASIC METABOLIC PANEL
Chloride: 103 mEq/L (ref 96–112)
GFR calc Af Amer: 75 mL/min — ABNORMAL LOW (ref >90)
Potassium: 3.9 mEq/L (ref 3.5–5.1)

## 2012-04-28 MED ORDER — KETOROLAC TROMETHAMINE 30 MG/ML IJ SOLN
30.0000 mg | Freq: Once | INTRAMUSCULAR | Status: AC
  Administered 2012-04-28: 30 mg via INTRAVENOUS
  Filled 2012-04-28: qty 1

## 2012-04-28 MED ORDER — IOHEXOL 300 MG/ML  SOLN
100.0000 mL | Freq: Once | INTRAMUSCULAR | Status: AC | PRN
  Administered 2012-04-28: 100 mL via INTRAVENOUS

## 2012-04-28 MED ORDER — SODIUM CHLORIDE 0.9 % IV BOLUS (SEPSIS)
1000.0000 mL | Freq: Once | INTRAVENOUS | Status: AC
  Administered 2012-04-28: 1000 mL via INTRAVENOUS

## 2012-04-28 MED ORDER — CIPROFLOXACIN HCL 500 MG PO TABS: 500.0000 mg | ORAL_TABLET | Freq: Two times a day (BID) | ORAL | Status: DC

## 2012-04-28 MED ORDER — CIPROFLOXACIN IN D5W 400 MG/200ML IV SOLN
400.0000 mg | Freq: Once | INTRAVENOUS | Status: AC
  Administered 2012-04-28: 400 mg via INTRAVENOUS
  Filled 2012-04-28: qty 200

## 2012-04-28 NOTE — ED Notes (Signed)
Bed:WA20<BR> Expected date:<BR> Expected time:<BR> Means of arrival:<BR> Comments:<BR> EMS

## 2012-04-28 NOTE — ED Notes (Signed)
Pt c/o constipation. Has urge to go every 45 minutes but unable to go.

## 2012-04-28 NOTE — ED Notes (Signed)
Assumed care of pt in rm 20. Pt sts he is here with urinary retention and constipation. Per Night RN bladder scanner showed 20 ml. Foley was placed prior to my arrival, has about 30 ml in. Pt sts he didn';t have a bowel movement in almost 2 weeks. Pt is lying in bed, in no obvious distress.

## 2012-04-28 NOTE — ED Notes (Signed)
Per EMS, pt from home, had urinary retention and constipation x 1 day. Per pt, states urine just won't come out, only getting a little bit out at a time. Burns when he voids. Denies unusual color or smell. VS: BP: 148/88 P: 100  RR: 16  SpO2: 94% RA

## 2012-04-28 NOTE — ED Notes (Signed)
Pt states he has difficulty voiding due to burning and retention since Friday.  Pt also concerned with constipation.

## 2012-04-28 NOTE — ED Provider Notes (Signed)
History     CSN: [id number]  Arrival date & time 04/28/12  0540   First MD Initiated Contact with Patient 04/28/12 [phone]      Chief Complaint  Patient presents with  . Urinary Retention    (Consider location/radiation/quality/duration/timing/severity/associated sxs/prior treatment) HPI The patient presents with concerns of ongoing dysuria, urinary frequency, decreased urine production, and new tenesmus. The polyuria, straining to urinate has been present for weeks to months.  He has been taking doxazosin.  He notes over the past day he has had increased difficulty producing urine, with new lower abdominal discomfort, pressure.  He notes concurrent development of constipation/tenesmus.  He denies new fever, chills, vomiting.  He has not seen a urologist. He states that he takes his other medication appropriately. He has no other focal complaints.  Past Medical History  Diagnosis Date  . Hypertension   . Schizophrenia   . Microcytosis   . Substance abuse     tobacco use  . High risk sexual behavior 10/2007    Past Surgical History  Procedure Date  . Laminectomy     by Dr Elesa Hacker    Family History  Problem Relation Age of Onset  . Diabetes Mother   . Hypertension Mother   . Lupus Sister   . Sickle cell anemia Brother     History  Substance Use Topics  . Smoking status: Current Every Day Smoker -- 1.5 packs/day for 38 years    Types: Cigarettes  . Smokeless tobacco: Not on file   Comment: cutting back.  Has tried  . Alcohol Use: Yes     40 oz beer/week      Review of Systems  Constitutional:       Per HPI, otherwise negative  HENT:       Per HPI, otherwise negative  Eyes: Negative.   Respiratory:       Per HPI, otherwise negative  Cardiovascular:       Per HPI, otherwise negative  Gastrointestinal: Positive for abdominal pain and constipation. Negative for vomiting, diarrhea, blood in stool, abdominal distention and anal bleeding.  Genitourinary:   Hpi  Musculoskeletal:       Per HPI, otherwise negative  Skin: Negative.   Neurological: Negative for syncope.  Psychiatric/Behavioral: Positive for hallucinations.    Allergies  Atorvastatin; Chlorpromazine hcl; and Simvastatin  Home Medications   Current Outpatient Rx  Name Route Sig Dispense Refill  . CICLOPIROX 8 % EX SOLN Topical Apply topically at bedtime. Apply over nail and surrounding skin. Apply daily over previous coat. After seven (7) days, may remove with alcohol and continue cycle.    Marland Kitchen DOXAZOSIN MESYLATE 2 MG PO TABS Oral Take 4 mg by mouth at bedtime.    . ENALAPRIL-HYDROCHLOROTHIAZIDE 5-12.5 MG PO TABS Oral Take 1 tablet by mouth daily. 30 tablet 6  . FLUPHENAZINE DECANOATE 25 MG/ML IJ SOLN Intramuscular Inject into the muscle every 14 (fourteen) days. Inject 1.25cc of 25mg /ml solution every 2 weeks    . IBUPROFEN 200 MG PO TABS Oral Take 200 mg by mouth every 8 (eight) hours as needed. For pain    . OLOPATADINE HCL 0.1 % OP SOLN Both Eyes Place 1 drop into both eyes 2 (two) times daily.      Marland Kitchen PRAVASTATIN SODIUM 40 MG PO TABS Oral Take 40 mg by mouth daily.      There were no vitals taken for this visit.  Physical Exam  Nursing note and vitals reviewed. Constitutional: He is  oriented to person, place, and time. He appears well-developed. No distress.  HENT:  Head: Normocephalic and atraumatic.  Eyes: Conjunctivae normal and EOM are normal.  Cardiovascular: Normal rate and regular rhythm.   Pulmonary/Chest: Effort normal. No stridor. No respiratory distress.  Abdominal: He exhibits no distension.  Genitourinary: Testes normal and penis normal. Right testis shows no mass, no swelling and no tenderness. Left testis shows no mass, no swelling and no tenderness. Circumcised. No penile tenderness. No discharge found.       Foley catheter in place.  Draining small amounts of urine.  Musculoskeletal: He exhibits no edema.  Lymphadenopathy:       Right: No inguinal  adenopathy present.       Left: No inguinal adenopathy present.  Neurological: He is alert and oriented to person, place, and time.  Skin: Skin is warm and dry.  Psychiatric: He has a normal mood and affect.    ED Course  Procedures (including critical care time)  Labs Reviewed  URINALYSIS, MICROSCOPIC ONLY - Abnormal; Notable for the following:    APPearance CLOUDY (*)     Specific Gravity, Urine 1.040 (*)     Glucose, UA >1000 (*)     Hgb urine dipstick SMALL (*)     Protein, ur 30 (*)     Leukocytes, UA SMALL (*)     Squamous Epithelial / LPF FEW (*)     All other components within normal limits  URINE CULTURE  CBC WITH DIFFERENTIAL  BASIC METABOLIC PANEL   No results found.   No diagnosis found.  9:06 AM Patient just starting to receive IVF / ABX.  He states that he feels better.  Labs reviewed w patient.  11:16 AM Patient back from XR.  Results c/w enteritits w mention of SBO.  Given the leukocytosis, CT ordered.  1:31 PM Patient feeling better.  He was made aware of all results, including ct  MDM  This 66 old male presents with new dysuria, on exam he also complains of difficulty voiding.  On exam the patient has a mildly tender abdomen.  Given the patient's description of decreased stool production as well as his ongoing abdominal pain, a plain film was ordered.  This raised suspicion of bowel obstruction, though he GU etiology seems most likely.  The patient's labs are consistent with ongoing infection.  However, the patient improved with fluids, ciprofloxacin.  Given the patient's difficulty initiating a urinary stream he was discharged with a Foley catheter leg bag, as well as antibiotics with urology followup for this episode is most consistent with prostatitis.  Gerhard Munch, MD 04/28/12 [phone]

## 2012-04-30 LAB — URINE CULTURE

## 2012-05-01 NOTE — ED Notes (Signed)
+   Urine Patient treated with Cipro-sensitive to same-chart appended per protocol MD. 

## 2012-07-07 ENCOUNTER — Other Ambulatory Visit: Payer: Self-pay | Admitting: Internal Medicine

## 2012-07-15 ENCOUNTER — Ambulatory Visit (INDEPENDENT_AMBULATORY_CARE_PROVIDER_SITE_OTHER): Payer: PRIVATE HEALTH INSURANCE | Admitting: Internal Medicine

## 2012-07-15 DIAGNOSIS — E119 Type 2 diabetes mellitus without complications: Secondary | ICD-10-CM

## 2012-07-17 NOTE — Progress Notes (Signed)
Patient ID: Wayne Schmidt, male   DOB: 12/15/1955, 57 y.o.   MRN: [id number] Opened in error

## 2012-07-29 ENCOUNTER — Ambulatory Visit (INDEPENDENT_AMBULATORY_CARE_PROVIDER_SITE_OTHER): Payer: PRIVATE HEALTH INSURANCE | Admitting: Internal Medicine

## 2012-07-29 ENCOUNTER — Encounter: Payer: Self-pay | Admitting: Internal Medicine

## 2012-07-29 VITALS — BP 134/84 | HR 86 | Temp 97.1°F | Ht 73.0 in | Wt 227.7 lb

## 2012-07-29 DIAGNOSIS — F209 Schizophrenia, unspecified: Secondary | ICD-10-CM

## 2012-07-29 DIAGNOSIS — N419 Inflammatory disease of prostate, unspecified: Secondary | ICD-10-CM

## 2012-07-29 DIAGNOSIS — Z79899 Other long term (current) drug therapy: Secondary | ICD-10-CM

## 2012-07-29 DIAGNOSIS — E785 Hyperlipidemia, unspecified: Secondary | ICD-10-CM

## 2012-07-29 DIAGNOSIS — F17200 Nicotine dependence, unspecified, uncomplicated: Secondary | ICD-10-CM

## 2012-07-29 DIAGNOSIS — E119 Type 2 diabetes mellitus without complications: Secondary | ICD-10-CM

## 2012-07-29 DIAGNOSIS — I10 Essential (primary) hypertension: Secondary | ICD-10-CM

## 2012-07-29 DIAGNOSIS — Z87898 Personal history of other specified conditions: Secondary | ICD-10-CM

## 2012-07-29 LAB — GLUCOSE, CAPILLARY: Glucose-Capillary: 125 mg/dL — ABNORMAL HIGH (ref 70–99)

## 2012-07-29 LAB — POCT GLYCOSYLATED HEMOGLOBIN (HGB A1C): Hemoglobin A1C: 6.8

## 2012-07-29 NOTE — Assessment & Plan Note (Signed)
Lab Results  Component Value Date   HGBA1C 6.8 07/29/2012   HGBA1C 6.2 01/01/2012   HGBA1C 6.5 09/21/2011     Assessment:  Diabetes control: good control (HgbA1C at goal)  Progress toward A1C goal:  at goal  Comments: cont diet control Plan:  Medications:  None  Home glucose monitoring:   Frequency:     Timing:    Instruction/counseling given: discussed foot care  Educational resources provided: brochure  Self management tools provided: home glucose meter  Other plans: cont current diet control

## 2012-07-29 NOTE — Assessment & Plan Note (Signed)
Prostatitis in October 2014 s/p course of ciprofloxacin, currently w/o dysuria, evaluated by Urology Dr. Sherron Monday.  Recs to cont doxazosin

## 2012-07-29 NOTE — Assessment & Plan Note (Signed)
Stable, hears voices of his "subconscious" managed by [facility] mental health

## 2012-07-29 NOTE — Patient Instructions (Signed)
General Instructions:  Your blood pressure and diabetes is under good control.  Hemoglobin A1c today was 6.8. You can contact Gavin Pound the Diabetic Educator in the clinic for concerns about your diabetes meter. Return to see me in 4-6 months.  Treatment Goals:  Goals (1 Years of Data) as of 07/29/2012          As of Today 04/28/12 02/05/12 01/01/12 01/01/12     Blood Pressure    . Blood Pressure < 140/90  134/84 140/77 115/81 86/65 99/65      Result Component    . HEMOGLOBIN A1C < 7.0  6.8   6.2     . LDL CALC < 100     92       Progress Toward Treatment Goals:  Treatment Goal 07/29/2012  Hemoglobin A1C at goal  Blood pressure at goal  Stop smoking smoking the same amount    Self Care Goals & Plans:  Self Care Goal 07/29/2012  Manage my medications take my medicines as prescribed; bring my medications to every visit; refill my medications on time  Eat healthy foods drink diet soda or water instead of juice or soda; eat more vegetables; eat foods that are low in salt       Care Management & Community Referrals:

## 2012-07-29 NOTE — Assessment & Plan Note (Signed)
BP Readings from Last 3 Encounters:  07/29/12 134/84  04/28/12 140/77  02/05/12 115/81    Lab Results  Component Value Date   NA 137 04/28/2012   K 3.9 04/28/2012   CREATININE 1.21 04/28/2012    Assessment:  Blood pressure control: controlled  Progress toward BP goal:  at goal  Comments:   Plan:  Medications:  continue current medications  Educational resources provided: brochure  Self management tools provided: home blood pressure logbook  Other plans: cont enalapril-hctz 5-12.5 mg qd

## 2012-07-29 NOTE — Progress Notes (Signed)
  Subjective:    Patient ID: Wayne Schmidt, male    DOB: 05-21-1956, 57 y.o.   MRN: [id number]  HPI  Presents for f/u htn, DM, and prostatitis.   Review of Systems  Constitutional: Negative for fever and chills.  Respiratory: Negative for cough and shortness of breath.   Cardiovascular: Negative for chest pain, palpitations and leg swelling.  Gastrointestinal: Negative for nausea, abdominal pain, diarrhea, constipation and blood in stool.  Genitourinary: Positive for frequency. Negative for dysuria, discharge, difficulty urinating, penile pain and testicular pain.  Neurological: Negative for dizziness, light-headedness, numbness and headaches.  Psychiatric/Behavioral: Negative for confusion, dysphoric mood and agitation.       Objective:   Physical Exam  Constitutional: He is oriented to person, place, and time. He appears well-developed and well-nourished.  HENT:  Head: Normocephalic and atraumatic.  Eyes: Conjunctivae normal and EOM are normal. Pupils are equal, round, and reactive to light.       Murky sclera  Neck: Normal range of motion. Neck supple.  Cardiovascular: Normal rate, regular rhythm, normal heart sounds and intact distal pulses.   No murmur heard. Pulmonary/Chest: Effort normal and breath sounds normal. He has no wheezes.  Abdominal: Soft. Bowel sounds are normal. He exhibits no distension. There is no tenderness.  Musculoskeletal: Normal range of motion. He exhibits no edema.  Neurological: He is alert and oriented to person, place, and time.  Skin: Skin is warm and dry.  Psychiatric: He has a normal mood and affect. Judgment normal.          Assessment & Plan:  1. Htn: controlled on ACei-HCT combo  2: DM,Type 2, controlled: HgbA1c 6.8  3. Hyperlipidemia: check lipid panel 12/2012  4: prostatitis: resolved after Cipro, follows with Dr. Sherron Monday of [facility]  5. Schizophrenia: stable: managed by [facility]  6. Tobacco abuse: no interest in  quitting

## 2012-07-29 NOTE — Assessment & Plan Note (Signed)
Controlled on pravastatin, due for repeat lipid panel July 2014  Lipid Panel     Component Value Date/Time   CHOL 165 01/01/2012 1555   TRIG 151* 01/01/2012 1555   HDL 43 01/01/2012 1555   CHOLHDL 3.8 01/01/2012 1555   VLDL 30 01/01/2012 1555   LDLCALC 92 01/01/2012 1555

## 2012-08-26 ENCOUNTER — Ambulatory Visit (HOSPITAL_COMMUNITY): Payer: PRIVATE HEALTH INSURANCE | Admitting: Psychiatry

## 2012-08-27 ENCOUNTER — Telehealth (HOSPITAL_COMMUNITY): Payer: Self-pay | Admitting: *Deleted

## 2012-08-27 ENCOUNTER — Encounter (HOSPITAL_COMMUNITY): Payer: Self-pay | Admitting: Psychiatry

## 2012-08-27 ENCOUNTER — Ambulatory Visit (INDEPENDENT_AMBULATORY_CARE_PROVIDER_SITE_OTHER): Payer: 59 | Admitting: Psychiatry

## 2012-08-27 VITALS — BP 166/100 | HR 71 | Ht 71.0 in | Wt 225.0 lb

## 2012-08-27 DIAGNOSIS — F200 Paranoid schizophrenia: Secondary | ICD-10-CM

## 2012-08-27 MED ORDER — FLUPHENAZINE DECANOATE 25 MG/ML IJ SOLN: 25.0000 mg | INTRAMUSCULAR | Status: DC
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
## 2012-08-27 NOTE — Telephone Encounter (Signed)
AV:[id number] at BJY:[id number] has two sets of directions. Please clarify with provider and [facility] Dr.Arfeen, Order should read:  Fluphenazine deconoate (Prolixin) 25 mg/ml Inject 1 ml (25 mg) into the muscle every 14 days   [facility]ed [facility], order given verbally.

## 2012-08-29 ENCOUNTER — Telehealth (HOSPITAL_COMMUNITY): Payer: Self-pay | Admitting: *Deleted

## 2012-08-29 ENCOUNTER — Encounter (HOSPITAL_COMMUNITY): Payer: Self-pay | Admitting: Psychiatry

## 2012-08-29 ENCOUNTER — Ambulatory Visit (INDEPENDENT_AMBULATORY_CARE_PROVIDER_SITE_OTHER): Payer: 59 | Admitting: Psychiatry

## 2012-08-29 ENCOUNTER — Ambulatory Visit (HOSPITAL_COMMUNITY): Payer: Self-pay | Admitting: *Deleted

## 2012-08-29 DIAGNOSIS — F209 Schizophrenia, unspecified: Secondary | ICD-10-CM
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
## 2012-08-29 NOTE — Telephone Encounter (Signed)
Patient came for appt with therapist and injection of Prolixin. Per patient, pharmacy did not have medication ready. [facility] [place] - Per Okey Regal, medication has been ordered, but not in store yet. Advised to call back tomorrow same time to check on delivery. Also contacted [facility] at [facility] request -- Darel Hong stated the medicine was not in stock and was back ordered. Injection rescheduled for Monday 09/02/12. Patient offered oral medication take in interim, but he stated he thought he would be okay without it. Dr.Arfeen informed.

## 2012-08-29 NOTE — Telephone Encounter (Signed)
Received faxed message from [facility]. Fluophenazine is on back order. They do not know when they will have a supply of the medication, and want to know if provider will be prescribing another medication

## 2012-08-30 ENCOUNTER — Other Ambulatory Visit (HOSPITAL_COMMUNITY): Payer: Self-pay | Admitting: Psychiatry

## 2012-08-30 DIAGNOSIS — F200 Paranoid schizophrenia: Secondary | ICD-10-CM

## 2012-08-30 MED ORDER — FLUPHENAZINE DECANOATE 25 MG/ML IJ SOLN: 25.0000 mg | INTRAMUSCULAR | Status: DC

## 2012-08-30 NOTE — Progress Notes (Signed)
Spoke to pharmacy and patient.  Prolixin Decanoate is not available [facility] however [facility] pharmacy will dispense his Prolixin Dec. we will call prescription admitted to local pharmacy.

## 2012-09-02 ENCOUNTER — Ambulatory Visit (INDEPENDENT_AMBULATORY_CARE_PROVIDER_SITE_OTHER): Payer: 59 | Admitting: *Deleted

## 2012-09-02 VITALS — BP 120/78 | HR 90

## 2012-09-02 DIAGNOSIS — F209 Schizophrenia, unspecified: Secondary | ICD-10-CM

## 2012-09-02 MED ORDER — FLUPHENAZINE DECANOATE 25 MG/ML IJ SOLN
25.0000 mg | INTRAMUSCULAR | Status: DC
  Administered 2012-09-02: 25 mg via INTRAMUSCULAR

## 2012-09-12 ENCOUNTER — Encounter (HOSPITAL_COMMUNITY): Payer: Self-pay | Admitting: Psychiatry

## 2012-09-12 ENCOUNTER — Ambulatory Visit (INDEPENDENT_AMBULATORY_CARE_PROVIDER_SITE_OTHER): Payer: 59 | Admitting: Psychiatry

## 2012-09-12 DIAGNOSIS — F209 Schizophrenia, unspecified: Secondary | ICD-10-CM
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
## 2012-09-17 ENCOUNTER — Ambulatory Visit (INDEPENDENT_AMBULATORY_CARE_PROVIDER_SITE_OTHER): Payer: 59 | Admitting: *Deleted

## 2012-09-17 DIAGNOSIS — F209 Schizophrenia, unspecified: Secondary | ICD-10-CM

## 2012-09-17 MED ORDER — FLUPHENAZINE DECANOATE 25 MG/ML IJ SOLN
25.0000 mg | INTRAMUSCULAR | Status: DC
  Administered 2012-09-17: 25 mg via INTRAMUSCULAR

## 2012-09-17 NOTE — Progress Notes (Signed)
Patient states no issues with past injection.

## 2012-09-25 ENCOUNTER — Encounter (HOSPITAL_COMMUNITY): Payer: Self-pay | Admitting: Psychiatry

## 2012-09-25 ENCOUNTER — Ambulatory Visit (INDEPENDENT_AMBULATORY_CARE_PROVIDER_SITE_OTHER): Payer: 59 | Admitting: Psychiatry

## 2012-09-25 VITALS — BP 119/86 | HR 84 | Wt 223.8 lb

## 2012-09-25 DIAGNOSIS — F200 Paranoid schizophrenia: Secondary | ICD-10-CM
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
## 2012-10-01 ENCOUNTER — Ambulatory Visit (INDEPENDENT_AMBULATORY_CARE_PROVIDER_SITE_OTHER): Payer: 59 | Admitting: Psychiatry

## 2012-10-01 ENCOUNTER — Encounter (HOSPITAL_COMMUNITY): Payer: Self-pay | Admitting: Psychiatry

## 2012-10-01 DIAGNOSIS — F209 Schizophrenia, unspecified: Secondary | ICD-10-CM

## 2012-10-01 NOTE — Progress Notes (Signed)
   THERAPIST PROGRESS NOTE  Session Time: 9:30-10:20  Participation Level: Active  Behavioral Response: CasualAlertEuthymic  Type of Therapy: Individual Therapy  Treatment Goals addressed: stress management, perspective seeking, boundaries  Interventions: Strength-based and Supportive  Summary: Wayne Schmidt is a 57 y.o. male who presents with schizophrenia.   Suicidal/Homicidal: Nowithout intent/plan  Therapist Response: Pt. Discussed recent interactions with housing board, not feeling heard or respected. Developed treatment plan.  Plan: Return again in 2 weeks.  Diagnosis: Axis I: Psychotic Disorder NOS    Axis II: No diagnosis    Wynonia Musty 10/01/2012

## 2012-10-15 ENCOUNTER — Encounter (HOSPITAL_COMMUNITY): Payer: Self-pay | Admitting: Psychiatry

## 2012-10-15 ENCOUNTER — Ambulatory Visit (HOSPITAL_COMMUNITY): Payer: Self-pay | Admitting: *Deleted

## 2012-10-15 ENCOUNTER — Ambulatory Visit (INDEPENDENT_AMBULATORY_CARE_PROVIDER_SITE_OTHER): Payer: 59 | Admitting: Psychiatry

## 2012-10-15 ENCOUNTER — Ambulatory Visit (INDEPENDENT_AMBULATORY_CARE_PROVIDER_SITE_OTHER): Payer: 59 | Admitting: *Deleted

## 2012-10-15 VITALS — BP 125/87 | HR 81

## 2012-10-15 DIAGNOSIS — F209 Schizophrenia, unspecified: Secondary | ICD-10-CM

## 2012-10-15 MED ORDER — FLUPHENAZINE DECANOATE 25 MG/ML IJ SOLN
25.0000 mg | INTRAMUSCULAR | Status: DC
  Administered 2012-10-15: 25 mg via INTRAMUSCULAR

## 2012-10-15 NOTE — Progress Notes (Signed)
   THERAPIST PROGRESS NOTE  Session Time: 10:30-11:20  Participation Level: Active  Behavioral Response: CasualAlertEuthymic  Type of Therapy: Individual Therapy  Treatment Goals addressed: stress management  Interventions: Strength-based and Supportive  Summary: Wayne Schmidt is a 57 y.o. male who presents with schizophrenia.   Suicidal/Homicidal: Nowithout intent/plan  Therapist Response: Pt. Appeared restless, concerned about receiving medication on time, indicated some conflict between religious beliefs and continuing medication. Pt. Continued focus on religious themes.  Plan: Return again in 2 weeks.  Diagnosis: Axis I: Psychotic Disorder NOS    Axis II: No diagnosis    Wynonia Musty 10/15/2012

## 2012-10-22 ENCOUNTER — Ambulatory Visit (INDEPENDENT_AMBULATORY_CARE_PROVIDER_SITE_OTHER): Payer: 59 | Admitting: Psychiatry

## 2012-10-22 ENCOUNTER — Encounter (HOSPITAL_COMMUNITY): Payer: Self-pay | Admitting: Psychiatry

## 2012-10-22 VITALS — BP 130/94 | HR 73 | Wt 225.2 lb

## 2012-10-22 DIAGNOSIS — F200 Paranoid schizophrenia: Secondary | ICD-10-CM
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
## 2012-10-29 ENCOUNTER — Encounter (HOSPITAL_COMMUNITY): Payer: Self-pay | Admitting: Psychiatry

## 2012-10-29 ENCOUNTER — Ambulatory Visit (INDEPENDENT_AMBULATORY_CARE_PROVIDER_SITE_OTHER): Payer: 59 | Admitting: *Deleted

## 2012-10-29 ENCOUNTER — Ambulatory Visit (INDEPENDENT_AMBULATORY_CARE_PROVIDER_SITE_OTHER): Payer: 59 | Admitting: Psychiatry

## 2012-10-29 DIAGNOSIS — F209 Schizophrenia, unspecified: Secondary | ICD-10-CM

## 2012-10-29 MED ORDER — FLUPHENAZINE DECANOATE 25 MG/ML IJ SOLN
25.0000 mg | Freq: Once | INTRAMUSCULAR | Status: AC
  Administered 2012-10-29: 25 mg via INTRAMUSCULAR

## 2012-10-29 NOTE — Progress Notes (Signed)
   THERAPIST PROGRESS NOTE  Session Time: 11:30-12:20  Participation Level: Active  Behavioral Response: CasualAlertEuthymic  Type of Therapy: Individual Therapy  Treatment Goals addressed: communication skills, developing healthy boundaries in relationships  Interventions: Solution Focused, Strength-based and Supportive  Summary: Wayne Schmidt is a 57 y.o. male who presents with schizophrenia.   Suicidal/Homicidal: Nowithout intent/plan  Therapist Response: Pt. Processed recent interactions with housemates and romantic interest. Continuing to identify behaviors that may be in conflict with personal values and the development of long-term relationships.  Plan: Return again in 2 weeks.  Diagnosis: Axis I: Psychotic Disorder NOS    Axis II: No diagnosis    Wynonia Musty 10/29/2012

## 2012-11-03 ENCOUNTER — Other Ambulatory Visit: Payer: Self-pay | Admitting: Internal Medicine

## 2012-11-04 ENCOUNTER — Telehealth (HOSPITAL_COMMUNITY): Payer: Self-pay

## 2012-11-04 ENCOUNTER — Encounter: Payer: Self-pay | Admitting: Internal Medicine

## 2012-11-04 ENCOUNTER — Ambulatory Visit (INDEPENDENT_AMBULATORY_CARE_PROVIDER_SITE_OTHER): Payer: PRIVATE HEALTH INSURANCE | Admitting: Internal Medicine

## 2012-11-04 VITALS — BP 138/85 | HR 88 | Temp 97.7°F | Ht 73.0 in | Wt 229.0 lb

## 2012-11-04 DIAGNOSIS — E119 Type 2 diabetes mellitus without complications: Secondary | ICD-10-CM

## 2012-11-04 DIAGNOSIS — E785 Hyperlipidemia, unspecified: Secondary | ICD-10-CM

## 2012-11-04 DIAGNOSIS — Z87898 Personal history of other specified conditions: Secondary | ICD-10-CM

## 2012-11-04 DIAGNOSIS — F17200 Nicotine dependence, unspecified, uncomplicated: Secondary | ICD-10-CM

## 2012-11-04 DIAGNOSIS — I10 Essential (primary) hypertension: Secondary | ICD-10-CM

## 2012-11-04 DIAGNOSIS — F209 Schizophrenia, unspecified: Secondary | ICD-10-CM

## 2012-11-04 LAB — POCT GLYCOSYLATED HEMOGLOBIN (HGB A1C): Hemoglobin A1C: 6.7

## 2012-11-04 LAB — GLUCOSE, CAPILLARY: Glucose-Capillary: 113 mg/dL — ABNORMAL HIGH (ref 70–99)

## 2012-11-04 MED ORDER — PRAVASTATIN SODIUM 40 MG PO TABS: 40.0000 mg | ORAL_TABLET | Freq: Every day | ORAL | Status: DC

## 2012-11-04 MED ORDER — DOXAZOSIN MESYLATE 4 MG PO TABS: 4.0000 mg | ORAL_TABLET | Freq: Every day | ORAL | Status: DC

## 2012-11-04 MED ORDER — ENALAPRIL-HYDROCHLOROTHIAZIDE 5-12.5 MG PO TABS: ORAL_TABLET | ORAL | Status: DC

## 2012-11-04 NOTE — Progress Notes (Signed)
  Subjective:    Patient ID: Wayne Schmidt, male    DOB: 06-Jan-1956, 57 y.o.   MRN: [id number]  HPI Wayne Schmidt presents to clinic for f/u of Diabetes Mellitus Type 2, hypertension, and schizophrenia. He has been seeing a new psychiatrist at [facility] which he states is somewhat uncomfortable for him because he prefers male therapist.  States he will continue to keep his appointment and take his medication as prescribed.   Review of Systems  Constitutional: Negative for fever and fatigue.  HENT: Negative for rhinorrhea.   Respiratory: Negative for cough and shortness of breath.   Cardiovascular: Negative for chest pain and palpitations.  Gastrointestinal: Negative for diarrhea and blood in stool.  Genitourinary: Positive for frequency. Negative for hematuria.  Musculoskeletal: Positive for back pain.  Skin: Negative for rash.  Neurological: Negative for weakness and numbness.  Psychiatric/Behavioral: Positive for hallucinations and sleep disturbance. Negative for behavioral problems, dysphoric mood and agitation.       Cont auditory hallucinations, non directive, believes it is his self-conscious talking  Chronic insomnia       Objective:   Physical Exam  Constitutional: He is oriented to person, place, and time. He appears well-developed and well-nourished. No distress.  HENT:  Head: Normocephalic and atraumatic.  Eyes: Conjunctivae and EOM are normal. Pupils are equal, round, and reactive to light.  Neck: Normal range of motion. Neck supple.  Cardiovascular: Normal rate, regular rhythm, normal heart sounds and intact distal pulses.   Pulmonary/Chest: Effort normal and breath sounds normal.  Abdominal: Soft. Bowel sounds are normal.  Musculoskeletal: Normal range of motion. He exhibits no edema and no tenderness.  Neurological: He is alert and oriented to person, place, and time.  Skin: Skin is warm and dry.  Psychiatric: He has a normal mood and affect. His behavior is normal.   Religious oriented discourse throughout exam          Assessment & Plan:  1. DM Type 2: diet-controlled, HgbA1c 6.7 -foot exam on next visit and will need eye exam f/u  2. Hypertension: controlled at goal on ACEi and thiazide -cont enalapril-HCT 5-12.5 mg qd  3. schizophrenia: stable on Prolixin, followed by Behavioral Health  4. BPH: stable on doxazosin  5. Dyslipidemia: lipid panel next visit

## 2012-11-04 NOTE — Assessment & Plan Note (Signed)
  Assessment: Progress toward smoking cessation:  smoking the same amount Barriers to progress toward smoking cessation:  lack of motivation to quit Comments:   Plan: Instruction/counseling given:  I counseled patient on the dangers of tobacco use. Educational resources provided:  [employer]) brochure Self management tools provided:    Medications to assist with smoking cessation:  None Patient agreed to the following self-care plans for smoking cessation: go to the [employer] ([email or web address])  Other plans: will cont to urge pt to try alternatives for smoking ie gum, etc.

## 2012-11-04 NOTE — Assessment & Plan Note (Signed)
Lab Results  Component Value Date   HGBA1C 6.7 11/04/2012   HGBA1C 6.8 07/29/2012   HGBA1C 6.2 01/01/2012     Assessment: Diabetes control: good control (HgbA1C at goal) Progress toward A1C goal:    Comments: diet controlled  Plan: Medications:  continue current medications Home glucose monitoring: Frequency:   Timing:   Instruction/counseling given: discussed foot care Educational resources provided: brochure Self management tools provided:   Other plans: cont current management, f/u 3-4 months with foot exam and referral for eye exam

## 2012-11-04 NOTE — Assessment & Plan Note (Signed)
BP Readings from Last 3 Encounters:  11/04/12 138/85  10/22/12 130/94  10/15/12 125/87    Lab Results  Component Value Date   NA 137 04/28/2012   K 3.9 04/28/2012   CREATININE 1.21 04/28/2012    Assessment: Blood pressure control: controlled Progress toward BP goal:  at goal Comments:   Plan: Medications:  continue current medications Educational resources provided: brochure Self management tools provided:   Other plans: current enalapril-HCT 5-12.5 mg qd

## 2012-11-04 NOTE — Telephone Encounter (Signed)
11:26am 11/04/12 Called pt due to moving to another provider due to [facility] insurance/sh

## 2012-11-04 NOTE — Patient Instructions (Addendum)
General Instructions: Your diabetes and blood pressure control is very good. Continue taking your medications as instructed.  I have refilled them today. Follow up in 4 months.   Treatment Goals:  Goals (1 Years of Data) as of 11/04/12         As of Today 10/22/12 10/15/12 09/25/12 09/02/12     Blood Pressure    . Blood Pressure < 140/90  138/85 130/94 125/87 119/86 120/78     Result Component    . HEMOGLOBIN A1C < 7.0  6.7        . LDL CALC < 100            Progress Toward Treatment Goals:  Treatment Goal 11/04/2012  Hemoglobin A1C -  Blood pressure at goal  Stop smoking smoking the same amount    Self Care Goals & Plans:  Self Care Goal 11/04/2012  Manage my medications take my medicines as prescribed; bring my medications to every visit; refill my medications on time  Eat healthy foods drink diet soda or water instead of juice or soda; eat more vegetables; eat foods that are low in salt; eat baked foods instead of fried foods  Stop smoking go to the [employer] ([email or web address])       Care Management & Community Referrals:

## 2012-11-04 NOTE — Assessment & Plan Note (Signed)
Will check lipid panel at next visit. 

## 2012-11-04 NOTE — Assessment & Plan Note (Signed)
Now followed at [facility].  Has a male psychiatrist now which he states he needs to "warm up to" as he prefers woman therapist

## 2012-11-06 NOTE — Progress Notes (Signed)
I discussed this case with Dr. Bosie Clos soon after she saw the patient. I have read her documentation and I agree with her plan of care. Please see the resident note for details of management.

## 2012-11-12 ENCOUNTER — Ambulatory Visit (INDEPENDENT_AMBULATORY_CARE_PROVIDER_SITE_OTHER): Payer: 59 | Admitting: *Deleted

## 2012-11-12 ENCOUNTER — Ambulatory Visit (HOSPITAL_COMMUNITY): Payer: Self-pay | Admitting: Psychiatry

## 2012-11-12 VITALS — BP 104/80 | HR 93

## 2012-11-12 DIAGNOSIS — F209 Schizophrenia, unspecified: Secondary | ICD-10-CM

## 2012-11-12 MED ORDER — FLUPHENAZINE DECANOATE 25 MG/ML IJ SOLN
25.0000 mg | INTRAMUSCULAR | Status: DC
  Administered 2012-11-12: 25 mg via INTRAMUSCULAR

## 2012-11-19 ENCOUNTER — Ambulatory Visit (INDEPENDENT_AMBULATORY_CARE_PROVIDER_SITE_OTHER): Payer: 59 | Admitting: Licensed Clinical Social Worker

## 2012-11-19 ENCOUNTER — Ambulatory Visit (HOSPITAL_COMMUNITY): Payer: Self-pay | Admitting: Psychiatry

## 2012-11-19 DIAGNOSIS — F209 Schizophrenia, unspecified: Secondary | ICD-10-CM

## 2012-11-19 NOTE — Progress Notes (Signed)
   THERAPIST PROGRESS NOTE  Session Time: 11:30am-12:20pm  Participation Level: Active  Behavioral Response: Well GroomedAlertAnxious and Euthymic  Type of Therapy: Individual Therapy  Treatment Goals addressed: Coping  Interventions: Strength-based, Supportive, Reframing and Other: reality testing  Summary: Wayne Schmidt is a 57 y.o. male who presents with euthymic mood and bright affect. [name] is being transferred from [name], [facility], due to insurance reasons. He reviews his history with this Clinical research associate, his current stressors and wants to continue therapy as a means to "vent" until the doctor tells him he does not need to continue to do this. He lives at [facility] and endorses stress because his neighbors ask him for money, food and cigarettes. He does not want to say no to them because he feels it is his [name] duty to help them. He is frustrated because he completed a program to help him gain employment, but he has only been approved to work four hours per week, which he is unhappy about and believes is unrealistic. He has children and grandchildren, whom he is close to and provide meaning for his life. He has struggled with schizophrenia since his twenties.    Suicidal/Homicidal: Nowithout intent/plan  Therapist Response: Assessed patients current functioning and reviewed progress. Reviewed coping strategies. Assessed patients safety and assisted in identifying protective factors.  Reviewed crisis plan with patient. Assisted patient with the expression of frustration . Reviewed patients self care plan. Assessed progress related to self care. Patients self care is good. Recommend daily exercise, increased socialization and recreation. Patient's speech is tangential in nature. He is religiously preoccupied, frequently quoting scripture to support his life choices. He eludes to [facility], but denies when specifically asked. He denies any VH. 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 He demonstrates paranoia over this topic. When asked what he is looking for in therapy, he replies that he is just looking to vent his feelings. He is not open to feedback about setting healthy limits with his neighbors as he believes is not the [place] thing to do.   Plan: Return again in four weeks.  Diagnosis: Axis I: Chronic Paranoid Schizophrenia    Axis II: No diagnosis    Koula Venier, LCSW 11/19/2012

## 2012-11-26 ENCOUNTER — Ambulatory Visit (INDEPENDENT_AMBULATORY_CARE_PROVIDER_SITE_OTHER): Payer: 59 | Admitting: *Deleted

## 2012-11-26 VITALS — BP 112/78 | HR 90 | Ht 73.0 in | Wt 225.0 lb

## 2012-11-26 DIAGNOSIS — F209 Schizophrenia, unspecified: Secondary | ICD-10-CM

## 2012-11-26 MED ORDER — FLUPHENAZINE DECANOATE 25 MG/ML IJ SOLN
25.0000 mg | Freq: Once | INTRAMUSCULAR | Status: AC
  Administered 2012-11-26: 25 mg via INTRAMUSCULAR

## 2012-12-10 ENCOUNTER — Ambulatory Visit (INDEPENDENT_AMBULATORY_CARE_PROVIDER_SITE_OTHER): Payer: 59 | Admitting: *Deleted

## 2012-12-10 VITALS — BP 139/90 | HR 95 | Wt 223.8 lb

## 2012-12-10 DIAGNOSIS — F209 Schizophrenia, unspecified: Secondary | ICD-10-CM

## 2012-12-10 MED ORDER — FLUPHENAZINE DECANOATE 25 MG/ML IJ SOLN
25.0000 mg | INTRAMUSCULAR | Status: DC
  Administered 2012-12-10: 25 mg via INTRAMUSCULAR

## 2012-12-19 ENCOUNTER — Ambulatory Visit (INDEPENDENT_AMBULATORY_CARE_PROVIDER_SITE_OTHER): Payer: 59 | Admitting: Licensed Clinical Social Worker

## 2012-12-19 DIAGNOSIS — F209 Schizophrenia, unspecified: Secondary | ICD-10-CM

## 2012-12-23 ENCOUNTER — Ambulatory Visit (INDEPENDENT_AMBULATORY_CARE_PROVIDER_SITE_OTHER): Payer: 59 | Admitting: Psychiatry

## 2012-12-23 ENCOUNTER — Encounter (HOSPITAL_COMMUNITY): Payer: Self-pay | Admitting: Psychiatry

## 2012-12-23 VITALS — BP 143/92 | HR 93 | Ht 73.0 in | Wt 225.0 lb

## 2012-12-23 DIAGNOSIS — F209 Schizophrenia, unspecified: Secondary | ICD-10-CM

## 2012-12-23 DIAGNOSIS — F200 Paranoid schizophrenia: Secondary | ICD-10-CM

## 2012-12-23 MED ORDER — FLUPHENAZINE DECANOATE 25 MG/ML IJ SOLN
37.5000 mg | INTRAMUSCULAR | Status: DC
  Administered 2012-12-23: 37.5 mg via INTRAMUSCULAR

## 2012-12-23 MED ORDER — FLUPHENAZINE DECANOATE 25 MG/ML IJ SOLN: 25.0000 mg | Freq: Once | INTRAMUSCULAR | Status: DC

## 2012-12-23 NOTE — Progress Notes (Signed)
   THERAPIST PROGRESS NOTE  Session Time: 11:30am-12:20pm  Participation Level: Active  Behavioral Response: Well GroomedAlertAnxious  Type of Therapy: Individual Therapy  Treatment Goals addressed: Coping  Interventions: Strength-based, Supportive, Reframing and Other: reality testing  Summary: Wayne Schmidt is a 57 y.o. male who presents with euthymic mood and bright affect. He reports doing well since his last session. He demonstrates active and consistent paranoia and discusses his beliefs that his ideas have been stolen. He is religiously preoccupied, frequently quoting scripture and talking about the end times. He believes he is a prophet and tries to spread the message of the gospel to others. He remains busy and active in his community. He has consistent delusions about composing famous music and coining the phrase "no child left behind" before the president used it. He is sleeping and eating well. He has some social interaction and expresses interest in woman.    Suicidal/Homicidal: Nowithout intent/plan  Therapist Response: Assessed patients current functioning and reviewed progress. Reviewed coping strategies. Assessed patients safety and assisted in identifying protective factors.  Reviewed crisis plan with patient. Assisted patient with the expression of frustration. Reviewed patients self care plan. Assessed progress related to self care. Patients self care is good. Recommend daily exercise, increased socialization and recreation. atient does not accept reality testing well. He demonstrates limitations natural to his diagnosis and benefits primarily from supportive counseling.   Plan: Return again in four weeks.  Diagnosis: Axis I: Chronic Paranoid Schizophrenia    Axis II: No diagnosis    Esteven Overfelt, LCSW 12/23/2012
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
## 2012-12-24 ENCOUNTER — Ambulatory Visit (HOSPITAL_COMMUNITY): Payer: Self-pay | Admitting: *Deleted

## 2013-01-07 ENCOUNTER — Ambulatory Visit (INDEPENDENT_AMBULATORY_CARE_PROVIDER_SITE_OTHER): Payer: 59 | Admitting: *Deleted

## 2013-01-07 VITALS — BP 139/90 | HR 81 | Ht 73.0 in | Wt 223.6 lb

## 2013-01-07 DIAGNOSIS — F209 Schizophrenia, unspecified: Secondary | ICD-10-CM

## 2013-01-07 MED ORDER — FLUPHENAZINE DECANOATE 25 MG/ML IJ SOLN
37.5000 mg | Freq: Once | INTRAMUSCULAR | Status: AC
  Administered 2013-01-07: 37.5 mg via INTRAMUSCULAR

## 2013-01-22 ENCOUNTER — Ambulatory Visit (INDEPENDENT_AMBULATORY_CARE_PROVIDER_SITE_OTHER): Payer: 59 | Admitting: *Deleted

## 2013-01-22 ENCOUNTER — Ambulatory Visit (INDEPENDENT_AMBULATORY_CARE_PROVIDER_SITE_OTHER): Payer: 59 | Admitting: Licensed Clinical Social Worker

## 2013-01-22 VITALS — BP 134/89 | HR 84 | Ht 73.0 in | Wt 222.0 lb

## 2013-01-22 DIAGNOSIS — F209 Schizophrenia, unspecified: Secondary | ICD-10-CM

## 2013-01-22 MED ORDER — FLUPHENAZINE DECANOATE 25 MG/ML IJ SOLN
37.5000 mg | INTRAMUSCULAR | Status: DC
  Administered 2013-01-22: 37.5 mg via INTRAMUSCULAR

## 2013-01-22 NOTE — Progress Notes (Signed)
   THERAPIST PROGRESS NOTE  Session Time: 11:30am-12:20pm  Participation Level: Active  Behavioral Response: Well GroomedAlertIrritable  Type of Therapy: Individual Therapy  Treatment Goals addressed: Coping  Interventions: Strength-based, Supportive, Reframing and Other: reality testing  Summary: Jonathandavid Marlett is a 57 y.o. male who presents with euthymic mood and bright affect. He reports feeling well, but is having some difficulty falling asleep. He reports that this is inconsistent. He is paranoid and agitated today when responding to questions. 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 He is angry that he is not receiving the royalties he deserves. He is religiously preoccupied, frequently quoting the bible and statistics from it. When asked how this therapy helps him, he becomes agitated and questions if this writer is implying that he doesn't need to attend therapy. He does respond to redirection.    Suicidal/Homicidal: Nowithout intent/plan  Therapist Response: Assessed patients current functioning and reviewed progress. Reviewed coping strategies. Assessed patients safety and assisted in identifying protective factors.  Reviewed crisis plan with patient. Assisted patient with the expression of frustration. Reviewed patients self care plan. Assessed progress related to self care. Patients self care is fair. Recommend daily exercise, increased socialization and recreation. Used DBT to practice mindfulness, review distraction list and improve distress tolerance skills. Patient is delusional throughout the majority of the session today. He wants to continue to attend therapy as he reports that it helps him.   Plan: Return again in four weeks.  Diagnosis: Axis I: Chronic Paranoid Schizophrenia    Axis II: No diagnosis    Rembert Browe, LCSW 01/22/2013

## 2013-02-04 ENCOUNTER — Ambulatory Visit (INDEPENDENT_AMBULATORY_CARE_PROVIDER_SITE_OTHER): Payer: 59 | Admitting: *Deleted

## 2013-02-04 DIAGNOSIS — F209 Schizophrenia, unspecified: Secondary | ICD-10-CM

## 2013-02-04 MED ORDER — FLUPHENAZINE DECANOATE 25 MG/ML IJ SOLN
37.5000 mg | Freq: Once | INTRAMUSCULAR | Status: AC
  Administered 2013-02-04: 37.5 mg via INTRAMUSCULAR

## 2013-02-18 ENCOUNTER — Ambulatory Visit (INDEPENDENT_AMBULATORY_CARE_PROVIDER_SITE_OTHER): Payer: 59 | Admitting: *Deleted

## 2013-02-18 ENCOUNTER — Ambulatory Visit (INDEPENDENT_AMBULATORY_CARE_PROVIDER_SITE_OTHER): Payer: 59 | Admitting: Psychiatry

## 2013-02-18 ENCOUNTER — Encounter (HOSPITAL_COMMUNITY): Payer: Self-pay | Admitting: Psychiatry

## 2013-02-18 VITALS — BP 141/90 | HR 82 | Ht 73.0 in | Wt 226.0 lb

## 2013-02-18 DIAGNOSIS — F200 Paranoid schizophrenia: Secondary | ICD-10-CM

## 2013-02-18 DIAGNOSIS — F209 Schizophrenia, unspecified: Secondary | ICD-10-CM

## 2013-02-18 MED ORDER — FLUPHENAZINE DECANOATE 25 MG/ML IJ SOLN
37.5000 mg | INTRAMUSCULAR | Status: DC
  Administered 2013-02-18: 37.5 mg via INTRAMUSCULAR
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
## 2013-02-25 ENCOUNTER — Ambulatory Visit (INDEPENDENT_AMBULATORY_CARE_PROVIDER_SITE_OTHER): Payer: 59 | Admitting: Licensed Clinical Social Worker

## 2013-02-25 DIAGNOSIS — F209 Schizophrenia, unspecified: Secondary | ICD-10-CM

## 2013-02-25 NOTE — Progress Notes (Signed)
   THERAPIST PROGRESS NOTE  Session Time: 11:30am-12:00pm  Participation Level: Active  Behavioral Response: Well GroomedAlertAnxious and Euthymic  Type of Therapy: Individual Therapy  Treatment Goals addressed: Coping  Interventions: Strength-based, Supportive, Reframing and Other: reality testing  Summary: Wayne Schmidt is a 56 y.o. male who presents with euthymic mood and anxious affect. He reports that therapy makes him nervous and he asks what this Clinical research associate is thinking. He reports that he is functioning well, eating and sleeping well and socializing. He is focused on his fixed delusions that he has composed famous music for the [employer] and other music groups. He reports that he is trying to adjust to "slowing down" as he ages and that this is challenging. He has religiously preoccupied delusions. He denies any AH or VH, but does endorse some paranioa.   Suicidal/Homicidal: Nowithout intent/plan  Therapist Response: Assessed patients current functioning and reviewed progress. Reviewed coping strategies. Assessed patients safety and assisted in identifying protective factors.  Reviewed crisis plan with patient. Assisted patient with the expression of frustration . Reviewed patients self care plan. Assessed progress related to self care. Patients self care is good. Recommend daily exercise, increased socialization and recreation. Used motivational interviewing to assist and encourage patient through the change process. Explored patients barriers to change. Patient is unable to tolerate reality testing and fully believes his fixed delusions.   Plan: Return again in six weeks.  Diagnosis: Axis I: Chronic Paranoid Schizophrenia    Axis II: No diagnosis    [name], LCSW 02/25/2013

## 2013-03-11 ENCOUNTER — Ambulatory Visit (HOSPITAL_COMMUNITY): Payer: Self-pay | Admitting: *Deleted

## 2013-03-11 ENCOUNTER — Ambulatory Visit (INDEPENDENT_AMBULATORY_CARE_PROVIDER_SITE_OTHER): Payer: 59 | Admitting: *Deleted

## 2013-03-11 VITALS — BP 116/80 | HR 80 | Ht 73.0 in | Wt 222.4 lb

## 2013-03-11 DIAGNOSIS — F209 Schizophrenia, unspecified: Secondary | ICD-10-CM

## 2013-03-11 MED ORDER — FLUPHENAZINE DECANOATE 25 MG/ML IJ SOLN
37.5000 mg | INTRAMUSCULAR | Status: DC
  Administered 2013-03-11: 37.5 mg via INTRAMUSCULAR

## 2013-03-25 ENCOUNTER — Ambulatory Visit (INDEPENDENT_AMBULATORY_CARE_PROVIDER_SITE_OTHER): Payer: 59 | Admitting: *Deleted

## 2013-03-25 ENCOUNTER — Encounter (HOSPITAL_COMMUNITY): Payer: Self-pay | Admitting: *Deleted

## 2013-03-25 VITALS — BP 144/99 | HR 71 | Ht 73.0 in | Wt 221.4 lb

## 2013-03-25 DIAGNOSIS — F209 Schizophrenia, unspecified: Secondary | ICD-10-CM

## 2013-03-25 MED ORDER — FLUPHENAZINE DECANOATE 25 MG/ML IJ SOLN
37.5000 mg | INTRAMUSCULAR | Status: DC
  Administered 2013-03-25: 37.5 mg via INTRAMUSCULAR

## 2013-04-08 ENCOUNTER — Ambulatory Visit (INDEPENDENT_AMBULATORY_CARE_PROVIDER_SITE_OTHER): Payer: 59 | Admitting: Licensed Clinical Social Worker

## 2013-04-08 ENCOUNTER — Ambulatory Visit (INDEPENDENT_AMBULATORY_CARE_PROVIDER_SITE_OTHER): Payer: 59 | Admitting: *Deleted

## 2013-04-08 VITALS — BP 155/92 | HR 63 | Ht 73.0 in | Wt 225.6 lb

## 2013-04-08 DIAGNOSIS — F209 Schizophrenia, unspecified: Secondary | ICD-10-CM

## 2013-04-08 MED ORDER — FLUPHENAZINE DECANOATE 25 MG/ML IJ SOLN
37.5000 mg | INTRAMUSCULAR | Status: DC
  Administered 2013-04-08: 37.5 mg via INTRAMUSCULAR

## 2013-04-08 NOTE — Progress Notes (Signed)
   THERAPIST PROGRESS NOTE  Session Time: 9:30am-10:00am  Participation Level: Active  Behavioral Response: Well GroomedAlertEuthymic  Type of Therapy: Individual Therapy  Treatment Goals addressed: Coping  Interventions: Strength-based, Supportive and Other: reality testing  Summary: Wayne Schmidt is a 57 y.o. male who presents with euthymic mood and bright affect. He reports that he is doing well. He feels better on the increased does of his medication, but does report joint pain, but is willing to tolerate this side effect for now. He is taking good care of himself. He reports spending a lot of his time helping his neighbors and is occasionally bothered by this. His sleep and appetite are wnl.   Suicidal/Homicidal: Nowithout intent/plan  Therapist Response: Assessed patients current functioning and reviewed progress. Reviewed coping strategies. Assessed patients safety and assisted in identifying protective factors.  Reviewed crisis plan with patient. Assisted patient with the expression of frustration with his neighbors and his finances. Reviewed patients self care plan. Assessed progress related to self care. Patients self care is good. Recommend daily exercise, increased socialization and recreation. Used motivational interviewing to assist and encourage patient through the change process. Explored patients barriers to change. Provided reality testing. Patients delusions remain, but he is less focused on them today. His religious preoccupation is less today as well.  Plan: Return again in eight weeks.  Diagnosis: Axis I: Chronic Paranoid Schizophrenia    Axis II: No diagnosis    Hiilei Gerst, LCSW 04/08/2013

## 2013-04-14 ENCOUNTER — Encounter: Payer: Self-pay | Admitting: Internal Medicine

## 2013-04-14 ENCOUNTER — Ambulatory Visit (INDEPENDENT_AMBULATORY_CARE_PROVIDER_SITE_OTHER): Payer: PRIVATE HEALTH INSURANCE | Admitting: Internal Medicine

## 2013-04-14 VITALS — BP 126/87 | HR 81 | Temp 97.5°F | Ht 73.0 in | Wt 225.4 lb

## 2013-04-14 DIAGNOSIS — R42 Dizziness and giddiness: Secondary | ICD-10-CM

## 2013-04-14 DIAGNOSIS — I10 Essential (primary) hypertension: Secondary | ICD-10-CM

## 2013-04-14 DIAGNOSIS — Z23 Encounter for immunization: Secondary | ICD-10-CM

## 2013-04-14 DIAGNOSIS — B351 Tinea unguium: Secondary | ICD-10-CM

## 2013-04-14 DIAGNOSIS — Z87898 Personal history of other specified conditions: Secondary | ICD-10-CM

## 2013-04-14 DIAGNOSIS — F209 Schizophrenia, unspecified: Secondary | ICD-10-CM

## 2013-04-14 DIAGNOSIS — F17200 Nicotine dependence, unspecified, uncomplicated: Secondary | ICD-10-CM

## 2013-04-14 DIAGNOSIS — E785 Hyperlipidemia, unspecified: Secondary | ICD-10-CM

## 2013-04-14 DIAGNOSIS — E119 Type 2 diabetes mellitus without complications: Secondary | ICD-10-CM

## 2013-04-14 LAB — POCT GLYCOSYLATED HEMOGLOBIN (HGB A1C): Hemoglobin A1C: 6.1

## 2013-04-14 LAB — GLUCOSE, CAPILLARY: Glucose-Capillary: 91 mg/dL (ref 70–99)

## 2013-04-14 NOTE — Assessment & Plan Note (Signed)
Lipid panel today. Managed with pravastatin

## 2013-04-14 NOTE — Assessment & Plan Note (Addendum)
BP Readings from Last 3 Encounters:  04/14/13 126/87  04/08/13 155/92  03/25/13 144/99    Lab Results  Component Value Date   NA 137 04/28/2012   K 3.9 04/28/2012   CREATININE 1.21 04/28/2012    Assessment: Blood pressure control: controlled Progress toward BP goal:  at goal Comments: on ACEi-HCT combo  Plan: Medications:  continue current medications Educational resources provided: brochure;handout;video Self management tools provided:   Other plans: cont enalapril-hct5-12.5 , check bmet next visit

## 2013-04-14 NOTE — Patient Instructions (Signed)
General Instructions: Start checking your blood sugar when you feel light headed. Call the clinic if your sugar is less than 60 during these lightheaded spells. Try to start cutting down on your cigarette smoking. You received the flu shot today, had a diabetic foot check  and we checked your cholesterol.   Treatment Goals:  Goals (1 Years of Data) as of 04/14/13         As of Today 04/08/13 03/25/13 03/11/13 02/18/13     Blood Pressure    . Blood Pressure < 140/90  126/87 155/92 144/99 116/80 141/90     Result Component    . HEMOGLOBIN A1C < 7.0  6.1        . LDL CALC < 100            Progress Toward Treatment Goals:  Treatment Goal 04/14/2013  Hemoglobin A1C at goal  Blood pressure at goal  Stop smoking smoking the same amount    Self Care Goals & Plans:  Self Care Goal 04/14/2013  Manage my medications take my medicines as prescribed; bring my medications to every visit; refill my medications on time; follow the sick day instructions if I am sick  Monitor my health keep track of my weight  Eat healthy foods eat more vegetables; eat fruit for snacks and desserts; eat baked foods instead of fried foods; eat smaller portions; drink diet soda or water instead of juice or soda  Be physically active find an activity I enjoy; take a walk every day  Stop smoking -    No flowsheet data found.   Care Management & Community Referrals:  No flowsheet data found.

## 2013-04-14 NOTE — Progress Notes (Signed)
  Subjective:    Patient ID: Wayne Schmidt, male    DOB: [birth year], 57 y.o.   MRN: [id number]  HPI  [name] is a 57 yo AA male with PMHx significant for schizophrenia managed with Prolixin, hypertension on ACEi-HCT combo; diabetes mellitus Type 2 diet controlled with HgbA1c 6.7, and tobacco abuse. He presents for diabetes and hypertension f/u.  On query he states that sometimes he feels a little light headed especially if he is smoking a cigarettestated that his cbg was 37 when he checked but unclear if his meter is working properly. Does not check his cbgs regularly and thinks that the machine is broken. Denies chest pain, shortness of breath, leg swelling but has persistent insomnia. Voices still present, Denies SI/HI.   Review of Systems  Constitutional: Negative for fatigue.  Respiratory: Negative for chest tightness and shortness of breath.   Cardiovascular: Negative for chest pain.  Gastrointestinal: Negative for constipation and blood in stool.  Genitourinary: Negative for dysuria.  Musculoskeletal: Negative for myalgias.  Skin: Negative for color change.  Neurological: Positive for light-headedness.  Psychiatric/Behavioral: Positive for hallucinations and sleep disturbance. Negative for suicidal ideas and self-injury.       Objective:   Physical Exam  Constitutional: He is oriented to person, place, and time. He appears well-developed and well-nourished. No distress.  HENT:  Head: Normocephalic and atraumatic.  Eyes: Conjunctivae are normal. Pupils are equal, round, and reactive to light.  Neck: Normal range of motion. Neck supple.  Cardiovascular: Normal rate, regular rhythm and normal heart sounds.   Pulmonary/Chest: Effort normal. He has no wheezes.  Abdominal: Soft. Bowel sounds are normal.  Musculoskeletal: Normal range of motion. He exhibits no edema.  Neurological: He is alert and oriented to person, place, and time.  Skin: Skin is warm and dry.  Psychiatric: He  has a normal mood and affect.  + auditory hallucinations which are stable          Assessment & Plan:  See problem-list charting for details:  1. Diabetes mellitus, Type 2: diet controlled, HgbA1c at goal 6.1 -meter given today to eval possible hypoglycemic episodes   2. Hypertension: stable at goal on enalapril-hct 5-12.5 mg qd -check BMET next visit  3. Schizophrenia: stable on Prolixin --follows with psych  4. Lightheadedness: intermittent, denies weakness, changes in speech or other symptoms suggestive of stroke, pt not on diabetic meds -brief consultation with DME, provided new meter today -will check cbg during periods of lightheadedness and call clinic if <65 -check CBC next visit  5. Insomnia: chronic but stable  6. tobacco abuse: not motivated to quit  7. Preventative Medicine: flu shot today, Eye exam last July [facility], colonoscopy due 2016, PneumoVax and TDap up to date  8. Hyperlipidemia: on pravastatin -check lipid panel today

## 2013-04-14 NOTE — Assessment & Plan Note (Signed)
Lab Results  Component Value Date   HGBA1C 6.1 04/14/2013   HGBA1C 6.7 11/04/2012   HGBA1C 6.8 07/29/2012     Assessment: Diabetes control: good control (HgbA1C at goal) Progress toward A1C goal:  at goal Comments: 6.8-->6.7-->6.1  Plan: Medications:  Diet controlled Home glucose monitoring: Frequency:   Timing:   Instruction/counseling given: reminded to get eye exam, discussed foot care and discussed diet Educational resources provided: brochure;handout Self management tools provided:   Other plans: eye exam July 2014 per [facility], foot exam today

## 2013-04-14 NOTE — Assessment & Plan Note (Signed)
Stable on Prolixin per Psych.

## 2013-04-14 NOTE — Assessment & Plan Note (Signed)
No change 

## 2013-04-14 NOTE — Assessment & Plan Note (Signed)
  Assessment: Progress toward smoking cessation:  smoking the same amount Barriers to progress toward smoking cessation:    Comments: will cont to counsel  Plan: Instruction/counseling given:  I counseled patient on the dangers of tobacco use, advised patient to stop smoking, and reviewed strategies to maximize success. Educational resources provided:  Quitline[employer]er, [employer]ellery) brochure Self management tools provided:    Medications to assist with smoking cessation:  None Patient agreed to the following self-care plans for smoking cessation:    Other plans: not motivated, relieves stress

## 2013-04-15 DIAGNOSIS — R42 Dizziness and giddiness: Secondary | ICD-10-CM | POA: Insufficient documentation

## 2013-04-15 LAB — LIPID PANEL
LDL Cholesterol: 90 mg/dL (ref 0–99)
Total CHOL/HDL Ratio: 3.1 Ratio
VLDL: 28 mg/dL (ref 0–40)

## 2013-04-15 NOTE — Assessment & Plan Note (Signed)
Managed per urology on doxazosin

## 2013-04-15 NOTE — Assessment & Plan Note (Signed)
Plan to check cbg during episodes which he reports are infrequent and asociated with smoking -check cbc next visit

## 2013-04-17 NOTE — Addendum Note (Signed)
Addended by: Debe Coder B on: 04/17/2013 02:10 PM   Modules accepted: Level of Service

## 2013-04-17 NOTE — Progress Notes (Signed)
Case discussed with Dr. Schooler soon after the resident saw the patient.  We reviewed the resident's history and exam and pertinent patient test results.  I agree with the assessment, diagnosis, and plan of care documented in the resident's note. 

## 2013-04-22 ENCOUNTER — Ambulatory Visit (INDEPENDENT_AMBULATORY_CARE_PROVIDER_SITE_OTHER): Payer: 59 | Admitting: *Deleted

## 2013-04-22 ENCOUNTER — Encounter (HOSPITAL_COMMUNITY): Payer: Self-pay | Admitting: Psychiatry

## 2013-04-22 ENCOUNTER — Ambulatory Visit (INDEPENDENT_AMBULATORY_CARE_PROVIDER_SITE_OTHER): Payer: 59 | Admitting: Psychiatry

## 2013-04-22 VITALS — BP 136/84 | HR 81 | Ht 73.0 in | Wt 227.0 lb

## 2013-04-22 DIAGNOSIS — F200 Paranoid schizophrenia: Secondary | ICD-10-CM

## 2013-04-22 DIAGNOSIS — F209 Schizophrenia, unspecified: Secondary | ICD-10-CM

## 2013-04-22 MED ORDER — FLUPHENAZINE DECANOATE 25 MG/ML IJ SOLN
37.5000 mg | INTRAMUSCULAR | Status: DC
  Administered 2013-04-22: 37.5 mg via INTRAMUSCULAR

## 2013-04-22 MED ORDER — FLUPHENAZINE DECANOATE 25 MG/ML IJ SOLN: 37.5000 mg | INTRAMUSCULAR | Status: DC
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
## 2013-05-01 ENCOUNTER — Other Ambulatory Visit: Payer: Self-pay | Admitting: Internal Medicine

## 2013-05-06 ENCOUNTER — Ambulatory Visit (INDEPENDENT_AMBULATORY_CARE_PROVIDER_SITE_OTHER): Payer: 59 | Admitting: *Deleted

## 2013-05-06 VITALS — BP 140/93 | HR 74 | Ht 73.0 in | Wt 222.0 lb

## 2013-05-06 DIAGNOSIS — F209 Schizophrenia, unspecified: Secondary | ICD-10-CM

## 2013-05-06 MED ORDER — FLUPHENAZINE DECANOATE 25 MG/ML IJ SOLN
37.5000 mg | INTRAMUSCULAR | Status: DC
  Administered 2013-05-06: 37.5 mg via INTRAMUSCULAR

## 2013-05-20 ENCOUNTER — Ambulatory Visit (INDEPENDENT_AMBULATORY_CARE_PROVIDER_SITE_OTHER): Payer: 59 | Admitting: *Deleted

## 2013-05-20 VITALS — BP 161/93 | HR 70 | Ht 73.0 in | Wt 227.0 lb

## 2013-05-20 DIAGNOSIS — F209 Schizophrenia, unspecified: Secondary | ICD-10-CM

## 2013-05-20 MED ORDER — FLUPHENAZINE DECANOATE 25 MG/ML IJ SOLN
37.5000 mg | INTRAMUSCULAR | Status: DC
  Administered 2013-05-20: 37.5 mg via INTRAMUSCULAR

## 2013-06-03 ENCOUNTER — Ambulatory Visit (INDEPENDENT_AMBULATORY_CARE_PROVIDER_SITE_OTHER): Payer: 59 | Admitting: *Deleted

## 2013-06-03 VITALS — BP 118/83 | HR 93 | Ht 73.0 in | Wt 224.0 lb

## 2013-06-03 DIAGNOSIS — F209 Schizophrenia, unspecified: Secondary | ICD-10-CM

## 2013-06-03 MED ORDER — FLUPHENAZINE DECANOATE 25 MG/ML IJ SOLN: 37.5000 mg | INTRAMUSCULAR | Status: DC

## 2013-06-03 NOTE — Progress Notes (Signed)
Prolixin injection given left deltoid Lot # [id number], patient supplied MAR not available for charting this injection

## 2013-06-09 ENCOUNTER — Ambulatory Visit (INDEPENDENT_AMBULATORY_CARE_PROVIDER_SITE_OTHER): Payer: 59 | Admitting: Licensed Clinical Social Worker

## 2013-06-09 DIAGNOSIS — F209 Schizophrenia, unspecified: Secondary | ICD-10-CM

## 2013-06-09 NOTE — Progress Notes (Signed)
   THERAPIST PROGRESS NOTE  Session Time: 11:30am-12:00pm  Participation Level: Active  Behavioral Response: Well GroomedAlertEuthymic  Type of Therapy: Individual Therapy  Treatment Goals addressed: Coping  Interventions: Strength-based and Supportive  Summary: Wayne Schmidt is a 57 y.o. male who presents with euthymic mood and anxious affect. He expresses anxiety over his finances and reports that he must now pay a deductible to come to therapy. He has had to borrow money to pay all his bills over the past few months. He reports that other than this anxiety, he is doing well. He wants to stop therapy due to lack of finances. This Clinical research associate agrees. Patients chronic delusions and limited insight made him a poor candidate for therapy. His sleep and appetite are wnl.   Suicidal/Homicidal: Nowithout intent/plan  Therapist Response: Assessed patients current functioning and reviewed progress. Reviewed coping strategies. Assessed patients safety and assisted in identifying protective factors.  Reviewed crisis plan with patient. Assisted patient with the expression of frustration. Reviewed patients self care plan. Assessed progress related to self care. Patients self care is good. Recommend daily exercise, increased socialization and recreation. Used motivational interviewing to assist and encourage patient through the change process. Explored patients barriers to change.   Plan: Return again if needed.  Diagnosis: Axis I: Chronic Paranoid Schizophrenia    Axis II: No diagnosis    Daphna Lafuente, LCSW 06/09/2013

## 2013-06-17 ENCOUNTER — Ambulatory Visit (INDEPENDENT_AMBULATORY_CARE_PROVIDER_SITE_OTHER): Payer: 59 | Admitting: *Deleted

## 2013-06-17 VITALS — BP 145/92 | HR 68 | Ht 73.0 in | Wt 227.6 lb

## 2013-06-17 DIAGNOSIS — F209 Schizophrenia, unspecified: Secondary | ICD-10-CM

## 2013-06-17 MED ORDER — FLUPHENAZINE DECANOATE 25 MG/ML IJ SOLN: 37.5000 mg | INTRAMUSCULAR | Status: DC

## 2013-06-17 NOTE — Progress Notes (Signed)
MAR not available Prolixin 37.5 mg injected IM, Rt deltoid. Lot #[id number] (0.30ml)&  #[id number] (1 ml) Pt supplied medication

## 2013-07-01 ENCOUNTER — Ambulatory Visit (INDEPENDENT_AMBULATORY_CARE_PROVIDER_SITE_OTHER): Payer: 59 | Admitting: *Deleted

## 2013-07-01 VITALS — BP 140/86 | HR 70 | Ht 73.0 in | Wt 231.4 lb

## 2013-07-01 DIAGNOSIS — F209 Schizophrenia, unspecified: Secondary | ICD-10-CM

## 2013-07-01 MED ORDER — FLUPHENAZINE DECANOATE 25 MG/ML IJ SOLN: 37.5000 mg | INTRAMUSCULAR | Status: DC

## 2013-07-01 NOTE — Progress Notes (Signed)
37.5mg  Prolixin given Left deltoid [id number],UJ supplied MAR not available for charting injection

## 2013-07-08 ENCOUNTER — Other Ambulatory Visit: Payer: Self-pay | Admitting: Internal Medicine

## 2013-07-15 ENCOUNTER — Ambulatory Visit (INDEPENDENT_AMBULATORY_CARE_PROVIDER_SITE_OTHER): Payer: 59 | Admitting: *Deleted

## 2013-07-15 VITALS — BP 136/85 | HR 80 | Ht 73.0 in | Wt 229.8 lb

## 2013-07-15 DIAGNOSIS — F209 Schizophrenia, unspecified: Secondary | ICD-10-CM

## 2013-07-15 MED ORDER — FLUPHENAZINE DECANOATE 25 MG/ML IJ SOLN: 37.5000 mg | INTRAMUSCULAR | Status: DC

## 2013-07-15 NOTE — Progress Notes (Signed)
37.5mg  Prolixin given  Right deltoid  [id number],ST supplied  MAR not available for charting injection

## 2013-07-22 ENCOUNTER — Ambulatory Visit (INDEPENDENT_AMBULATORY_CARE_PROVIDER_SITE_OTHER): Payer: 59 | Admitting: Psychiatry

## 2013-07-22 ENCOUNTER — Encounter (HOSPITAL_COMMUNITY): Payer: Self-pay | Admitting: Psychiatry

## 2013-07-22 VITALS — BP 141/85 | HR 89 | Ht 73.0 in | Wt 229.0 lb

## 2013-07-22 DIAGNOSIS — F200 Paranoid schizophrenia: Secondary | ICD-10-CM

## 2013-07-22 MED ORDER — FLUPHENAZINE DECANOATE 25 MG/ML IJ SOLN: 37.5000 mg | INTRAMUSCULAR | Status: DC

## 2013-07-22 NOTE — Progress Notes (Signed)
[place] [phone] Progress Note  [place] Whed[place] [id number] 58 y.o.  07/22/2013 10:32 AM  Chief Complaint: Medication management and follow up.      History of Present Illness: Wayne Schmidt came for his followup appointment.  He is getting injection Prolixin 37.5 mg IM every 2 weeks.  He is less paranoid and less irritable.  He had a quiet Christmas.  He does not like to be around people during holidays.  He sleeping good.  He denies any irritability, anger or any mood swing.  He endorses much improvement the Prolixin .  He has no tremors or shakes.  He lives by himself however he has brother and sister who lives in the town.  He wants to continue his current psychotropic medication.  He endorsed drinking beer very rarely but denies any binge drinking or using any illegal substances.  Suicidal Ideation: No Plan Formed: No Patient has means to carry out plan: No  Homicidal Ideation: No Plan Formed: No Patient has means to carry out plan: No  Review of Systems: Psychiatric: Agitation: No Hallucination: No Depressed Mood: No Insomnia: No Hypersomnia: No Altered Concentration: No Feels Worthless: No Grandiose Ideas: No Belief In Special Powers: Denies but appears religiously preoccupied. New/Increased Substance Abuse: No Compulsions: No  Neurologic: Headache: No Seizure: No Paresthesias: No  Medical history:  Patient has a history of diabetes which is diet controlled, high cholesterol, hypertension, prostate hypertrophy and laminectomy.  Outpatient Encounter Prescriptions as of 07/22/2013  Medication Sig  . doxazosin (CARDURA) 2 MG tablet TAKE 1 TABLET (2 MG TOTAL) BY MOUTH AT BEDTIME.  . Enalapril-Hydrochlorothiazide 5-12.5 MG per tablet TAKE 1 TABLET BY MOUTH DAILY.  . fluPHENAZine decanoate (PROLIXIN) 25 MG/ML injection Inject 1.5 mLs (37.5 mg total) into the muscle every 14 (fourteen) days.  . pravastatin (PRAVACHOL) 40 MG tablet TAKE 1 TABLET (40 MG TOTAL) BY MOUTH  DAILY.  . [DISCONTINUED] fluPHENAZine decanoate (PROLIXIN) 25 MG/ML injection Inject 1.5 mLs (37.5 mg total) into the muscle every 14 (fourteen) days.  . ciclopirox (LOPROX) 0.77 % SUSP   . ciclopirox (PENLAC) 8 % solution Apply topically at bedtime. Apply over nail and surrounding skin. Apply daily over previous coat. After seven (7) days, may remove with alcohol and continue cycle.  . [DISCONTINUED] doxazosin (CARDURA) 4 MG tablet Take 1 tablet (4 mg total) by mouth at bedtime.    Past Psychiatric History/Hospitalization(s): Anxiety: No Bipolar Disorder: No Depression: Yes Mania: No Psychosis: Yes Schizophrenia: Yes Personality Disorder: No Hospitalization for psychiatric illness: Patient has a long history of psychiatric illness with multiple hospitalization.  He recall at least 16-20 times hospitalized.  He was [facility] for psychosis hallucination and paranoia.  In the past he had tried Haldol and Thorazine but endorse that he was allergic to these medication.  He is taking Prolixin injection for past 30 years.  He has at least 2 psychiatric admission because of suicidal attempt when he took overdose on his medication. History of Electroconvulsive Shock Therapy: No Prior Suicide Attempts: At least 2 psychiatric admission do to suicidal attempt when he took overdose on his medication.  Physical Exam: Constitutional:  BP 141/85  Pulse 89  Ht 6\' 1"  (1.854 m)  Wt 229 lb (103.874 kg)  BMI 30.22 kg/m2  No results found for this or any previous visit (from the past 2160 hour(s)).  General Appearance: well nourished and Casually dressed and fairly groomed.  He remains guarded and fair eye contact.    Musculoskeletal: Strength &  Muscle Tone: within normal limits Gait & Station: normal Patient leans: N/A  Psychiatric: Speech (describe rate, volume, coherence, spontaneity, and abnormalities if any): Soft clear and decreased tone and volume.  Thought Process (describe  rate, content, abstract reasoning, and computation): Slow at times rambling  Associations: Circumstantial  Thoughts: Guarded and religiously preoccupied but no delusion or hallucination at this time.  Mental Status: Orientation: oriented to person, place and time/date Mood & Affect: depressed affect Attention Span & Concentration: Fair  Medical Decision Making (Choose Three): Established Problem, Stable/Improving (1), Review of Last Therapy Session (1) and Review of Medication Regimen & Side Effects (2)  Assessment: Axis I: Chronic schizophrenia paranoid type  Axis II: Deferred  Axis III:  Patient Active Problem List   Diagnosis Date Noted  . Episodic lightheadedness 04/15/2013  . 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 MICROCYTOSIS 02/24/2010  . BENIGN PROSTATIC HYPERTROPHY, HX OF 03/24/2009  . SCHIZOPHRENIA 10/11/2006  . DYSLIPIDEMIA 08/28/2006  . DIABETES MELLITUS, TYPE II 04/27/2006  . TOBACCO ABUSE 04/27/2006  . HYPERTENSION 04/27/2006  . LAMINECTOMY, HX OF 04/27/2006    Axis IV: Mild to moderate  Axis V: 55-60   Plan:  I will continue his current psychiatric medication.  Patient is getting Prolixin intramuscular 37.5 mg IM every 2 weeks .  Recommend to call us back if he has any question or concern otherwise i will see him in 3 months.  Agusta Hackenberg T., MD 07/22/2013

## 2013-07-29 ENCOUNTER — Ambulatory Visit (INDEPENDENT_AMBULATORY_CARE_PROVIDER_SITE_OTHER): Payer: 59 | Admitting: *Deleted

## 2013-07-29 VITALS — BP 136/86 | HR 88 | Ht 73.0 in | Wt 229.0 lb

## 2013-07-29 DIAGNOSIS — F209 Schizophrenia, unspecified: Secondary | ICD-10-CM

## 2013-07-29 MED ORDER — FLUPHENAZINE DECANOATE 25 MG/ML IJ SOLN: 37.5000 mg | INTRAMUSCULAR | Status: DC

## 2013-07-29 NOTE — Progress Notes (Signed)
37.5mg /1.5 cc Prolixin given IM  Left deltoid  0.5 cc from VAP#[id number], 1 cc from Lot # [id number], pt supplied  MAR not available for charting injection

## 2013-08-04 ENCOUNTER — Encounter: Payer: Self-pay | Admitting: Internal Medicine

## 2013-08-04 ENCOUNTER — Ambulatory Visit (INDEPENDENT_AMBULATORY_CARE_PROVIDER_SITE_OTHER): Payer: PRIVATE HEALTH INSURANCE | Admitting: Internal Medicine

## 2013-08-04 VITALS — BP 129/86 | HR 82 | Temp 96.6°F | Ht 73.0 in | Wt 232.1 lb

## 2013-08-04 DIAGNOSIS — E119 Type 2 diabetes mellitus without complications: Secondary | ICD-10-CM

## 2013-08-04 DIAGNOSIS — I10 Essential (primary) hypertension: Secondary | ICD-10-CM

## 2013-08-04 DIAGNOSIS — M5416 Radiculopathy, lumbar region: Secondary | ICD-10-CM | POA: Insufficient documentation

## 2013-08-04 DIAGNOSIS — M25551 Pain in right hip: Secondary | ICD-10-CM

## 2013-08-04 DIAGNOSIS — M129 Arthropathy, unspecified: Secondary | ICD-10-CM

## 2013-08-04 DIAGNOSIS — F209 Schizophrenia, unspecified: Secondary | ICD-10-CM

## 2013-08-04 DIAGNOSIS — M1990 Unspecified osteoarthritis, unspecified site: Secondary | ICD-10-CM

## 2013-08-04 DIAGNOSIS — M25559 Pain in unspecified hip: Secondary | ICD-10-CM

## 2013-08-04 DIAGNOSIS — F17200 Nicotine dependence, unspecified, uncomplicated: Secondary | ICD-10-CM

## 2013-08-04 DIAGNOSIS — M542 Cervicalgia: Secondary | ICD-10-CM

## 2013-08-04 LAB — GLUCOSE, CAPILLARY: GLUCOSE-CAPILLARY: 114 mg/dL — AB (ref 70–99)

## 2013-08-04 LAB — POCT GLYCOSYLATED HEMOGLOBIN (HGB A1C): HEMOGLOBIN A1C: 6.6

## 2013-08-04 MED ORDER — DICLOFENAC SODIUM 1 % TD GEL: 4.0000 g | Freq: Four times a day (QID) | TRANSDERMAL | Status: DC

## 2013-08-04 MED ORDER — IBUPROFEN 800 MG PO TABS: 800.0000 mg | ORAL_TABLET | Freq: Four times a day (QID) | ORAL | Status: DC | PRN

## 2013-08-04 NOTE — Patient Instructions (Addendum)
General Instructions: Take the ibuprofen 800 mg every 6 hours as needed for shoulder or hip pain. You may use the diclofenac (Voltaren) gel as prescribed to help with the pain. We will check your kidney function and blood count today. If you change your min about Physical Therapy, let the clinic know. Follow-up in 3-4 months.   Treatment Goals:  Goals (1 Years of Data) as of 08/04/13         As of Today 07/29/13 07/22/13 07/15/13 07/01/13     Blood Pressure    . Blood Pressure < 140/90  129/86 136/86 141/85 136/85 140/86     Result Component    . HEMOGLOBIN A1C < 7.0  6.6        . LDL CALC < 100            Progress Toward Treatment Goals:  Treatment Goal 08/04/2013  Hemoglobin A1C at goal  Blood pressure at goal  Stop smoking smoking the same amount    Self Care Goals & Plans:  Self Care Goal 08/04/2013  Manage my medications take my medicines as prescribed; bring my medications to every visit; refill my medications on time; follow the sick day instructions if I am sick  Monitor my health keep track of my weight; check my feet daily  Eat healthy foods eat more vegetables; eat fruit for snacks and desserts; eat baked foods instead of fried foods; drink diet soda or water instead of juice or soda  Be physically active -  Stop smoking -    Home Blood Glucose Monitoring 08/04/2013  Check my blood sugar no home glucose monitoring     Care Management & Community Referrals:  No flowsheet data found.

## 2013-08-05 LAB — BASIC METABOLIC PANEL
BUN: 18 mg/dL (ref 6–23)
CALCIUM: 9.7 mg/dL (ref 8.4–10.5)
CO2: 27 meq/L (ref 19–32)
CREATININE: 1.22 mg/dL (ref 0.50–1.35)
Chloride: 104 mEq/L (ref 96–112)
Glucose, Bld: 110 mg/dL — ABNORMAL HIGH (ref 70–99)
Potassium: 4.5 mEq/L (ref 3.5–5.3)
Sodium: 139 mEq/L (ref 135–145)

## 2013-08-05 LAB — CBC
HCT: 45.3 % (ref 39.0–52.0)
HEMOGLOBIN: 14.8 g/dL (ref 13.0–17.0)
MCH: 24.4 pg — AB (ref 26.0–34.0)
MCHC: 32.7 g/dL (ref 30.0–36.0)
MCV: 74.8 fL — ABNORMAL LOW (ref 78.0–100.0)
Platelets: 149 10*3/uL — ABNORMAL LOW (ref 150–400)
RBC: 6.06 MIL/uL — AB (ref 4.22–5.81)
RDW: 16.8 % — ABNORMAL HIGH (ref 11.5–15.5)
WBC: 7.2 10*3/uL (ref 4.0–10.5)

## 2013-08-06 NOTE — Assessment & Plan Note (Signed)
  Assessment: Progress toward smoking cessation:  smoking the same amount Barriers to progress toward smoking cessation:  stress;lack of motivation to quit Comments:   Plan: Instruction/counseling given:  I counseled patient on the dangers of tobacco use, advised patient to stop smoking, and reviewed strategies to maximize success. Educational resources provided:  [employer] account manager) brochure Self management tools provided:    Medications to assist with smoking cessation:  None Patient agreed to the following self-care plans for smoking cessation:    Other plans: cont to counsel
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
## 2013-08-06 NOTE — Assessment & Plan Note (Signed)
Lab Results  Component Value Date   HGBA1C 6.6 08/04/2013   HGBA1C 6.1 04/14/2013   HGBA1C 6.7 11/04/2012     Assessment: Diabetes control: good control (HgbA1C at goal) Progress toward A1C goal:  at goal Comments: diet controlled  Plan: Medications:  none Home glucose monitoring: Frequency: no home glucose monitoring Timing:   Instruction/counseling given: discussed foot care Educational resources provided: brochure;handout Self management tools provided:   Other plans: cont to monitor

## 2013-08-06 NOTE — Assessment & Plan Note (Signed)
Good ROM, s/p laminectomy 20 yrs ago, currently not in pain, neg Neers and [place] -ibuprofen prn voltaran gel prn, could consider muscle relaxer as this appears to be more muscle related than cervical bony abnormalities

## 2013-08-07 NOTE — Progress Notes (Signed)
Case discussed with Dr. Schooler soon after the resident saw the patient.  We reviewed the resident's history and exam and pertinent patient test results.  I agree with the assessment, diagnosis, and plan of care documented in the resident's note. 

## 2013-08-12 ENCOUNTER — Ambulatory Visit (INDEPENDENT_AMBULATORY_CARE_PROVIDER_SITE_OTHER): Payer: 59 | Admitting: *Deleted

## 2013-08-12 VITALS — BP 158/96 | HR 84 | Ht 73.0 in | Wt 231.0 lb

## 2013-08-12 DIAGNOSIS — F209 Schizophrenia, unspecified: Secondary | ICD-10-CM

## 2013-08-12 NOTE — Progress Notes (Signed)
37.5mg /1.5 cc Prolixin given IM, Right deltoid  Lot # [id number], pt supplied  MAR not available

## 2013-08-26 ENCOUNTER — Ambulatory Visit (HOSPITAL_COMMUNITY): Payer: Self-pay | Admitting: *Deleted

## 2013-08-27 ENCOUNTER — Ambulatory Visit (INDEPENDENT_AMBULATORY_CARE_PROVIDER_SITE_OTHER): Payer: 59 | Admitting: *Deleted

## 2013-08-27 VITALS — BP 150/90 | HR 78 | Ht 73.0 in | Wt 230.0 lb

## 2013-08-27 DIAGNOSIS — F200 Paranoid schizophrenia: Secondary | ICD-10-CM

## 2013-08-27 NOTE — Progress Notes (Signed)
Prolixin 37.5 mg (1.5cc), IM Left Deltoid Lot # [id number] Patient supplied medication

## 2013-09-09 ENCOUNTER — Ambulatory Visit (INDEPENDENT_AMBULATORY_CARE_PROVIDER_SITE_OTHER): Payer: 59 | Admitting: *Deleted

## 2013-09-09 VITALS — BP 147/94 | HR 83 | Ht 73.0 in | Wt 231.8 lb

## 2013-09-09 DIAGNOSIS — F209 Schizophrenia, unspecified: Secondary | ICD-10-CM

## 2013-09-09 NOTE — Progress Notes (Signed)
Prolixin 37.5 mg (1.5cc), IM  Right Deltoid  Lot # [id number]  Patient supplied medication

## 2013-09-23 ENCOUNTER — Ambulatory Visit (INDEPENDENT_AMBULATORY_CARE_PROVIDER_SITE_OTHER): Payer: 59 | Admitting: *Deleted

## 2013-09-23 VITALS — BP 142/93 | HR 73 | Ht 73.0 in | Wt 233.0 lb

## 2013-09-23 DIAGNOSIS — F200 Paranoid schizophrenia: Secondary | ICD-10-CM

## 2013-09-23 NOTE — Progress Notes (Signed)
Prolixin 37.5 mg (1.5cc), IM  Left Deltoid  Lot # [id number] Patient supplied medication

## 2013-10-07 ENCOUNTER — Ambulatory Visit (INDEPENDENT_AMBULATORY_CARE_PROVIDER_SITE_OTHER): Payer: 59 | Admitting: *Deleted

## 2013-10-07 VITALS — BP 144/87 | HR 76 | Ht 73.0 in | Wt 235.4 lb

## 2013-10-07 DIAGNOSIS — F200 Paranoid schizophrenia: Secondary | ICD-10-CM

## 2013-10-07 NOTE — Progress Notes (Signed)
Prolixin 37.5 mg (1.5cc), IM  Right Deltoid  Lot # [id number]  Patient supplied medication

## 2013-10-20 ENCOUNTER — Ambulatory Visit (INDEPENDENT_AMBULATORY_CARE_PROVIDER_SITE_OTHER): Payer: 59 | Admitting: Psychiatry

## 2013-10-20 ENCOUNTER — Encounter (HOSPITAL_COMMUNITY): Payer: Self-pay | Admitting: Psychiatry

## 2013-10-20 ENCOUNTER — Ambulatory Visit (INDEPENDENT_AMBULATORY_CARE_PROVIDER_SITE_OTHER): Payer: 59 | Admitting: *Deleted

## 2013-10-20 VITALS — BP 149/84 | HR 78 | Ht 73.0 in | Wt 233.8 lb

## 2013-10-20 DIAGNOSIS — F200 Paranoid schizophrenia: Secondary | ICD-10-CM

## 2013-10-20 MED ORDER — FLUPHENAZINE DECANOATE 25 MG/ML IJ SOLN: 37.5000 mg | INTRAMUSCULAR | Status: DC

## 2013-10-20 NOTE — Progress Notes (Signed)
Prolixin 37.5 mg (1.5cc), IM, given Left Deltoid  Lot # [id number]  Patient supplied medication

## 2013-10-20 NOTE — Progress Notes (Signed)
Also charted within MD encounter: Prolixin 37.5 mg (1.5cc), IM, give Left Deltoid  Lot # [id number]  Patient supplied medication

## 2013-10-20 NOTE — Progress Notes (Signed)
[facility] [phone] Progress Note  Wayne Schmidt [id number] 58 y.o.  10/20/2013 2:45 PM  Chief Complaint: Medication management and follow up.      History of Present Illness: Wayne Schmidt came for his followup appointment.  He is getting injection Prolixin 37.5 mg IM every 2 weeks.  The patient denies any side effects of medication including any tremors or shakes.  He has complained of joint pain and he is taking Motrin for his arthritis.  Patient endorsed lately he has not had any hallucinations or any paranoia.  He has no extrapyramidal side effects.  He was to continue his Prolixin injection every 2 weeks.  He has been not involved in drinking or using any illegal substances.  Suicidal Ideation: No Plan Formed: No Patient has means to carry out plan: No  Homicidal Ideation: No Plan Formed: No Patient has means to carry out plan: No  Review of Systems: Psychiatric: Agitation: No Hallucination: No Depressed Mood: No Insomnia: No Hypersomnia: No Altered Concentration: No Feels Worthless: No Grandiose Ideas: No Belief In Special Powers: No New/Increased Substance Abuse: No Compulsions: No  Neurologic: Headache: No Seizure: No Paresthesias: No  Medical history:  Patient has a history of diabetes which is diet controlled, high cholesterol, hypertension, prostate hypertrophy and laminectomy.  Outpatient Encounter Prescriptions as of 10/20/2013  Medication Sig  . ciclopirox (LOPROX) 0.77 % SUSP   . ciclopirox (PENLAC) 8 % solution Apply topically at bedtime. Apply over nail and surrounding skin. Apply daily over previous coat. After seven (7) days, may remove with alcohol and continue cycle.  . diclofenac sodium (VOLTAREN) 1 % GEL Apply 4 g topically 4 (four) times daily.  Marland Kitchen doxazosin (CARDURA) 2 MG tablet TAKE 1 TABLET (2 MG TOTAL) BY MOUTH AT BEDTIME.  . Enalapril-Hydrochlorothiazide 5-12.5 MG per tablet TAKE 1 TABLET BY MOUTH DAILY.  . fluPHENAZine decanoate  (PROLIXIN) 25 MG/ML injection Inject 1.5 mLs (37.5 mg total) into the muscle every 14 (fourteen) days.  Marland Kitchen ibuprofen (ADVIL,MOTRIN) 800 MG tablet Take 1 tablet (800 mg total) by mouth every 6 (six) hours as needed for mild pain.  . pravastatin (PRAVACHOL) 40 MG tablet TAKE 1 TABLET (40 MG TOTAL) BY MOUTH DAILY.  . [DISCONTINUED] fluPHENAZine decanoate (PROLIXIN) 25 MG/ML injection Inject 1.5 mLs (37.5 mg total) into the muscle every 14 (fourteen) days.    Past Psychiatric History/Hospitalization(s): Anxiety: No Bipolar Disorder: No Depression: Yes Mania: No Psychosis: Yes Schizophrenia: Yes Personality Disorder: No Hospitalization for psychiatric illness: Patient has a long history of psychiatric illness with multiple hospitalization.  He recall at least 16-20 times hospitalized.  He was admitted at [facility] for psychosis hallucination and paranoia.  In the past he had tried Haldol and Thorazine but endorse that he was allergic to these medication.  He is taking Prolixin injection for past 30 years.  He has at least 2 psychiatric admission because of suicidal attempt when he took overdose on his medication. History of Electroconvulsive Shock Therapy: No Prior Suicide Attempts: At least 2 psychiatric admission do to suicidal attempt when he took overdose on his medication.  Physical Exam: Constitutional:  BP 149/84  Pulse 78  Ht 6\' 1"  (1.854 m)  Wt 233 lb 12.8 oz (106.051 kg)  BMI 30.85 kg/m2  Recent Results (from the past 2160 hour(s))  GLUCOSE, CAPILLARY     Status: Abnormal   Collection Time    08/04/13  2:51 PM      Result Value Ref Range   Glucose-Capillary 114 (*)  70 - 99 mg/dL  POCT GLYCOSYLATED HEMOGLOBIN (HGB A1C)     Status: None   Collection Time    08/04/13  3:04 PM      Result Value Ref Range   Hemoglobin A1C 6.6    BASIC METABOLIC PANEL     Status: Abnormal   Collection Time    08/04/13  3:30 PM      Result Value Ref Range   Sodium 139  135 - 145 mEq/L    Potassium 4.5  3.5 - 5.3 mEq/L   Chloride 104  96 - 112 mEq/L   CO2 27  19 - 32 mEq/L   Glucose, Bld 110 (*) 70 - 99 mg/dL   BUN 18  6 - 23 mg/dL   Creat 1.22  0.50 - 1.35 mg/dL   Calcium 9.7  8.4 - 10.5 mg/dL  CBC     Status: Abnormal   Collection Time    08/04/13  3:30 PM      Result Value Ref Range   WBC 7.2  4.0 - 10.5 K/uL   RBC 6.06 (*) 4.22 - 5.81 MIL/uL   Hemoglobin 14.8  13.0 - 17.0 g/dL   HCT 45.3  39.0 - 52.0 %   MCV 74.8 (*) 78.0 - 100.0 fL   MCH 24.4 (*) 26.0 - 34.0 pg   MCHC 32.7  30.0 - 36.0 g/dL   RDW 16.8 (*) 11.5 - 15.5 %   Platelets 149 (*) 150 - 400 K/uL    General Appearance: well nourished and Casually dressed and fairly groomed.  He remains guarded and fair eye contact.    Musculoskeletal: Strength & Muscle Tone: within normal limits Gait & Station: normal Patient leans: N/A  Psychiatric: Speech (describe rate, volume, coherence, spontaneity, and abnormalities if any): Soft clear and decreased tone and volume.  Thought Process (describe rate, content, abstract reasoning, and computation): Slow at times rambling  Associations: Circumstantial  Thoughts: Guarded and religiously preoccupied but no delusion or hallucination at this time.  Mental Status: Orientation: oriented to person, place and time/date Mood & Affect: depressed affect Attention Span & Concentration: Fair  Established Problem, Stable/Improving (1), Review of Last Therapy Session (1) and Review of Medication Regimen & Side Effects (2)  Assessment: Axis I: Chronic schizophrenia paranoid type  Axis II: Deferred  Axis III:  Patient Active Problem List   Diagnosis Date Noted  . Arthritis 08/04/2013  . Hip pain, right 08/04/2013  . Cervical muscle pain 08/04/2013  . Episodic lightheadedness 04/15/2013  . 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 MICROCYTOSIS 02/24/2010  . BENIGN PROSTATIC HYPERTROPHY, HX OF 03/24/2009  . SCHIZOPHRENIA 10/11/2006  . DYSLIPIDEMIA 08/28/2006  .  DIABETES MELLITUS, TYPE II 04/27/2006  . TOBACCO ABUSE 04/27/2006  . HYPERTENSION 04/27/2006  . LAMINECTOMY, HX OF 04/27/2006    Axis IV: Mild to moderate  Axis V: 55-60   Plan:  I will continue his current psychiatric medication.  Patient is getting Prolixin intramuscular 37.5 mg IM every 2 weeks .  Recommend to call us back if he has any question or concern otherwise i will see him in 3 months.  Byard Carranza T., MD 10/20/2013

## 2013-10-21 ENCOUNTER — Ambulatory Visit (HOSPITAL_COMMUNITY): Payer: Self-pay | Admitting: *Deleted

## 2013-10-31 ENCOUNTER — Other Ambulatory Visit: Payer: Self-pay | Admitting: Internal Medicine

## 2013-11-04 ENCOUNTER — Ambulatory Visit (INDEPENDENT_AMBULATORY_CARE_PROVIDER_SITE_OTHER): Payer: 59 | Admitting: *Deleted

## 2013-11-04 VITALS — BP 122/86 | HR 80 | Ht 73.0 in | Wt 231.2 lb

## 2013-11-04 DIAGNOSIS — F200 Paranoid schizophrenia: Secondary | ICD-10-CM

## 2013-11-04 NOTE — Progress Notes (Signed)
Prolixin 37.5 mg (1.5cc),given IM Right Deltoid  Lot # [id number] 0.5cc and Lot # [id number] 1 cc Patient supplied medication - from [place]

## 2013-11-05 ENCOUNTER — Other Ambulatory Visit: Payer: Self-pay | Admitting: *Deleted

## 2013-11-05 MED ORDER — PRAVASTATIN SODIUM 40 MG PO TABS: ORAL_TABLET | ORAL | Status: DC

## 2013-11-05 MED ORDER — ENALAPRIL-HYDROCHLOROTHIAZIDE 5-12.5 MG PO TABS: ORAL_TABLET | ORAL | Status: DC

## 2013-11-05 MED ORDER — DOXAZOSIN MESYLATE 2 MG PO TABS: ORAL_TABLET | ORAL | Status: DC

## 2013-11-17 ENCOUNTER — Encounter: Payer: Self-pay | Admitting: Internal Medicine

## 2013-11-17 ENCOUNTER — Ambulatory Visit (INDEPENDENT_AMBULATORY_CARE_PROVIDER_SITE_OTHER): Payer: PRIVATE HEALTH INSURANCE | Admitting: Internal Medicine

## 2013-11-17 VITALS — BP 146/91 | HR 75 | Temp 97.0°F | Ht 73.0 in | Wt 235.4 lb

## 2013-11-17 DIAGNOSIS — F209 Schizophrenia, unspecified: Secondary | ICD-10-CM

## 2013-11-17 DIAGNOSIS — B351 Tinea unguium: Secondary | ICD-10-CM

## 2013-11-17 DIAGNOSIS — F17200 Nicotine dependence, unspecified, uncomplicated: Secondary | ICD-10-CM

## 2013-11-17 DIAGNOSIS — E119 Type 2 diabetes mellitus without complications: Secondary | ICD-10-CM

## 2013-11-17 DIAGNOSIS — I10 Essential (primary) hypertension: Secondary | ICD-10-CM

## 2013-11-17 LAB — GLUCOSE, CAPILLARY: Glucose-Capillary: 99 mg/dL (ref 70–99)

## 2013-11-17 LAB — POCT GLYCOSYLATED HEMOGLOBIN (HGB A1C): Hemoglobin A1C: 6.7

## 2013-11-17 NOTE — Assessment & Plan Note (Signed)
Lab Results  Component Value Date   HGBA1C 6.7 11/17/2013   HGBA1C 6.6 08/04/2013   HGBA1C 6.1 04/14/2013     Assessment: Diabetes control: good control (HgbA1C at goal) Progress toward A1C goal:  at goal Comments:   Plan: Medications:  Diet controlled Home glucose monitoring: Frequency: no home glucose monitoring Timing:   Instruction/counseling given: reminded to get eye exam and discussed foot care Educational resources provided: brochure;handout Self management tools provided:   Other plans: diet controlled

## 2013-11-17 NOTE — Progress Notes (Signed)
   Subjective:    Patient ID: Wayne Schmidt, male    DOB: 03/06/1956, 58 y.o.   MRN: [id number]  HPI Presents for diabetes follow-up.  States that he continues to smoke 1.5 pack per day.  Has continued not to sleep well secondary voices, poor sleep regime (drinks coffee in the middle of the night) and smokes in the middle of the night as well, in addition he states that neighbors constantly knock on his door to borrow cigarettes throughout the night. Followed by Podiatry for onchyomychosis. Managed on fluphenazine by his psychiatrist with increase in dosage about a year ago. Pt appears a bit disgruntled about this as he reports that his symptoms were managed fine and say no need for increase in dosage.  Reports that every since then he has had an increase in his joint pains and tremor.  Of note, when queried, he has not brought this to the attention of Dr.Arfeen who manages his psychiatric care. He feels as though enough time is not spent face to face with psych thus he has not been forthright in terms of side effects and symptoms ie auditory hallucinations.  Reports that voices are the same "as usual". Thinks that they are more of his subconscious. No suicidal or homicidal ideation.   Review of Systems  Constitutional: Negative.   HENT: Negative.   Respiratory: Negative.   Cardiovascular: Negative.   Gastrointestinal: Negative.   Genitourinary: Negative.   Musculoskeletal: Positive for arthralgias. Negative for gait problem and joint swelling.  Neurological: Positive for light-headedness. Negative for seizures, syncope, speech difficulty, weakness, numbness and headaches.       Occasional light-headedness  Hematological: Negative.   Psychiatric/Behavioral: Positive for hallucinations and sleep disturbance.       Objective:   Physical Exam  Constitutional: He is oriented to person, place, and time. He appears well-developed and well-nourished. No distress.  HENT:  Head: Normocephalic and  atraumatic.  Eyes: Conjunctivae and EOM are normal. Pupils are equal, round, and reactive to light.  Neck: Normal range of motion. Neck supple. No thyromegaly present.  Cardiovascular: Normal rate.   Pulmonary/Chest: Effort normal and breath sounds normal.  Abdominal: Soft. Bowel sounds are normal.  Musculoskeletal: Normal range of motion. He exhibits no edema and no tenderness.  Neurological: He is alert and oriented to person, place, and time.  Skin: Skin is warm and dry.  Psychiatric: He has a normal mood and affect. His speech is normal and behavior is normal. Judgment normal. Thought content is not paranoid. He expresses no homicidal and no suicidal ideation.  States voices are more of his subconscious.  One is satan-like.          Assessment & Plan:  See separate problem list charting.

## 2013-11-17 NOTE — Patient Instructions (Signed)
General Instructions: Please bring your medicines with you each time you come.   Medicines may be  Eye drops  Herbal   Vitamins  Pills  Seeing these help [place] take care of you.    Treatment Goals:  Goals (1 Years of Data) as of 11/17/13         As of Today 11/04/13 10/20/13 10/07/13 09/23/13     Blood Pressure    . Blood Pressure < 140/90  146/91 122/86 149/84 144/87 142/93     Result Component    . HEMOGLOBIN A1C < 7.0  6.7        . LDL CALC < 100            Progress Toward Treatment Goals:  Treatment Goal 11/17/2013  Hemoglobin A1C at goal  Blood pressure at goal  Stop smoking smoking the same amount    Self Care Goals & Plans:  Self Care Goal 11/17/2013  Manage my medications take my medicines as prescribed; bring my medications to every visit; refill my medications on time; follow the sick day instructions if I am sick  Monitor my health keep track of my blood glucose; keep track of my weight; check my feet daily  Eat healthy foods eat more vegetables; eat fruit for snacks and desserts; eat baked foods instead of fried foods; eat foods that are low in salt; drink diet soda or water instead of juice or soda  Be physically active find an activity I enjoy  Stop smoking -    Home Blood Glucose Monitoring 11/17/2013  Check my blood sugar no home glucose monitoring     Care Management & Community Referrals:  No flowsheet data found.

## 2013-11-18 ENCOUNTER — Ambulatory Visit (INDEPENDENT_AMBULATORY_CARE_PROVIDER_SITE_OTHER): Payer: 59 | Admitting: *Deleted

## 2013-11-18 ENCOUNTER — Telehealth (HOSPITAL_COMMUNITY): Payer: Self-pay | Admitting: *Deleted

## 2013-11-18 VITALS — BP 148/97 | HR 81 | Wt 232.0 lb

## 2013-11-18 DIAGNOSIS — F200 Paranoid schizophrenia: Secondary | ICD-10-CM

## 2013-11-18 NOTE — Progress Notes (Signed)
Prolixin 37.5 mg (1.5cc),given IM Left Deltoid  Lot # [id number]   Patient supplied medication - from [place] movement noted in right hand.Patient states it has been happening with increasing frequency. Patient also states he experiences some stiffness in it as well. States he has had this happen in the past, but this is the first time in a long time.

## 2013-11-18 NOTE — Telephone Encounter (Signed)
In office for injection of prolixin.  Shaking movement noted in right hand.Patient states it has been happening with increasing frequency. Patient also states he experiences some stiffness in it as well. States he has had this happen in the past, but this is the first time in a long time.

## 2013-11-18 NOTE — Progress Notes (Signed)
INTERNAL MEDICINE TEACHING ATTENDING ADDENDUM - Aldine Contes, MD: I reviewed and discussed with the resident Dr. Michail Sermon, the patient's medical history, physical examination, diagnosis and results of pertinent tests and treatment and I agree with the patient's care as documented.

## 2013-12-02 ENCOUNTER — Ambulatory Visit (INDEPENDENT_AMBULATORY_CARE_PROVIDER_SITE_OTHER): Payer: 59 | Admitting: *Deleted

## 2013-12-02 VITALS — BP 138/88 | HR 100 | Ht 73.0 in | Wt 228.0 lb

## 2013-12-02 DIAGNOSIS — F200 Paranoid schizophrenia: Secondary | ICD-10-CM

## 2013-12-02 NOTE — Progress Notes (Signed)
Prolixin 37.5 mg (1.5cc),given IM  Right Deltoid  Lot # [id number]  Patient supplied medication - from [facility]   

## 2013-12-16 ENCOUNTER — Ambulatory Visit (INDEPENDENT_AMBULATORY_CARE_PROVIDER_SITE_OTHER): Payer: 59 | Admitting: *Deleted

## 2013-12-16 VITALS — BP 129/83 | HR 75 | Ht 73.0 in | Wt 231.0 lb

## 2013-12-16 DIAGNOSIS — F200 Paranoid schizophrenia: Secondary | ICD-10-CM

## 2013-12-16 NOTE — Progress Notes (Signed)
Prolixin 37.5 mg (1.5cc),given IM  Left Deltoid  Lot # [id number]  Patient supplied medication - from [place]

## 2013-12-30 ENCOUNTER — Ambulatory Visit (INDEPENDENT_AMBULATORY_CARE_PROVIDER_SITE_OTHER): Payer: 59 | Admitting: *Deleted

## 2013-12-30 VITALS — BP 133/97 | HR 86 | Ht 73.0 in | Wt 230.2 lb

## 2013-12-30 DIAGNOSIS — F200 Paranoid schizophrenia: Secondary | ICD-10-CM

## 2013-12-30 NOTE — Progress Notes (Signed)
Prolixin 37.5 mg (1.5cc),given IM  Right Deltoid  Lot # [id number]  Patient supplied medication - from [place]

## 2014-01-13 ENCOUNTER — Ambulatory Visit (INDEPENDENT_AMBULATORY_CARE_PROVIDER_SITE_OTHER): Payer: 59 | Admitting: *Deleted

## 2014-01-13 VITALS — BP 131/86 | HR 88 | Ht 73.0 in | Wt 233.0 lb

## 2014-01-13 DIAGNOSIS — F200 Paranoid schizophrenia: Secondary | ICD-10-CM

## 2014-01-13 NOTE — Progress Notes (Signed)
Prolixin 37.5 mg (1.5cc),given IM  Left Deltoid  Lot # [id number]  Patient supplied medication - from [place]

## 2014-01-20 ENCOUNTER — Encounter (HOSPITAL_COMMUNITY): Payer: Self-pay | Admitting: Psychiatry

## 2014-01-20 ENCOUNTER — Ambulatory Visit (INDEPENDENT_AMBULATORY_CARE_PROVIDER_SITE_OTHER): Payer: 59 | Admitting: Psychiatry

## 2014-01-20 VITALS — BP 158/96 | HR 95 | Ht 73.0 in | Wt 230.0 lb

## 2014-01-20 DIAGNOSIS — F200 Paranoid schizophrenia: Secondary | ICD-10-CM

## 2014-01-20 MED ORDER — FLUPHENAZINE DECANOATE 25 MG/ML IJ SOLN: 25.0000 mg | INTRAMUSCULAR | Status: DC

## 2014-01-20 NOTE — Progress Notes (Signed)
Wayne Schmidt Progress Note  Wayne Schmidt [id number] 58 y.o.  01/20/2014 11:10 AM  Chief Complaint: I think my tremors are getting worse.       History of Present Illness: Wayne Schmidt came for his followup appointment with his older daughter.  He is complaining of increase tremors and shakes.  His daughter noticed increased severity of the tremors in past few weeks.Marland Kitchen  He is also feeling dizziness and recently seen his primary care physician.  His hemoglobin A1c is slightly increased.  He is getting injection 37.5 mg Prolixin every 2 weeks.  Denies any paranoia, hallucinations, agitation or any aggressive thinking.  He is sleeping good.  He denies any crying spells.  He denies any feeling of hopelessness or worthlessness.  He believed tremors are coming from arthritis however it could be due to Prolixin.  He is taking Motrin for his arthritis.  The patient lives by himself.  He does not drink or use any illegal substances.  His vitals are stable.  His weight is unchanged from the past however his hemoglobin A1c is slightly increased in the past  Suicidal Ideation: No Plan Formed: No Patient has means to carry out plan: No  Homicidal Ideation: No Plan Formed: No Patient has means to carry out plan: No  Review of Systems  Skin: Positive for rash. Negative for itching.  Neurological: Positive for dizziness and tremors.   Psychiatric: Agitation: No Hallucination: No Depressed Mood: No Insomnia: No Hypersomnia: No Altered Concentration: No Feels Worthless: No Grandiose Ideas: No Belief In Special Powers: No New/Increased Substance Abuse: No Compulsions: No  Neurologic: Headache: No Seizure: No Paresthesias: No  Medical history:  Patient has a history of diabetes which is diet controlled, high cholesterol, hypertension, prostate hypertrophy and laminectomy.  Outpatient Encounter Prescriptions as of 01/20/2014  Medication Sig  . ciclopirox (LOPROX) 0.77 % SUSP   .  ciclopirox (PENLAC) 8 % solution Apply topically at bedtime. Apply over nail and surrounding skin. Apply daily over previous coat. After seven (7) days, may remove with alcohol and continue cycle.  . diclofenac sodium (VOLTAREN) 1 % GEL Apply 4 g topically 4 (four) times daily.  Marland Kitchen doxazosin (CARDURA) 2 MG tablet TAKE 1 TABLET (2 MG TOTAL) BY MOUTH AT BEDTIME.  . Enalapril-Hydrochlorothiazide 5-12.5 MG per tablet TAKE 1 TABLET BY MOUTH DAILY.  . fluPHENAZine decanoate (PROLIXIN) 25 MG/ML injection Inject 1 mL (25 mg total) into the muscle every 14 (fourteen) days.  Marland Kitchen ibuprofen (ADVIL,MOTRIN) 800 MG tablet Take 1 tablet (800 mg total) by mouth every 6 (six) hours as needed for mild pain.  . pravastatin (PRAVACHOL) 40 MG tablet TAKE 1 TABLET (40 MG TOTAL) BY MOUTH DAILY.  . [DISCONTINUED] fluPHENAZine decanoate (PROLIXIN) 25 MG/ML injection Inject 1.5 mLs (37.5 mg total) into the muscle every 14 (fourteen) days.    Past Psychiatric History/Hospitalization(s): Anxiety: No Bipolar Disorder: No Depression: Yes Mania: No Psychosis: Yes Schizophrenia: Yes Personality Disorder: No Hospitalization for psychiatric illness: Patient has a long history of psychiatric illness with multiple hospitalization.  He recall at least 16-20 times hospitalized.  He was admitted at [facility] for psychosis hallucination and paranoia.  In the past he had tried Haldol and Thorazine but endorse that he was allergic to these medication.  He is taking Prolixin injection for past 30 years.  He has at least 2 psychiatric admission because of suicidal attempt when he took overdose on his medication. History of Electroconvulsive Shock Therapy: No Prior Suicide Attempts: At least  2 psychiatric admission do to suicidal attempt when he took overdose on his medication.  Physical Exam: Constitutional:  BP 158/96  Pulse 95  Ht 6\' 1"  (1.854 m)  Wt 230 lb (104.327 kg)  BMI 30.35 kg/m2  Recent Results (from the past 2160  hour(s))  GLUCOSE, CAPILLARY     Status: None   Collection Time    11/17/13  3:24 PM      Result Value Ref Range   Glucose-Capillary 99  70 - 99 mg/dL  POCT GLYCOSYLATED HEMOGLOBIN (HGB A1C)     Status: None   Collection Time    11/17/13  3:34 PM      Result Value Ref Range   Hemoglobin A1C 6.7      General Appearance: well nourished and Casually dressed and fairly groomed.  He remains guarded and fair eye contact.    Musculoskeletal: Strength & Muscle Tone: within normal limits Gait & Station: normal Patient leans: N/A   Mental status examination The patient is well groomed well dressed man who appears to be in his stated age.  His speech is sometimes rambling with normal tone and volume.  He describes his mood as tired and his affect is mood appropriate.  He denies any auditory or visual hallucination.  He denies any paranoia but appears religiously preoccupied .  There were no delusions.  His thought process is logical and goal-directed.  His psychomotor activity is slow.  His fund of knowledge is average.  He has tremors mostly on his left hand.  His attention and concentration is fair.  He is alert and oriented x3.  His insight judgment and impulse control is okay.  Established Problem, Stable/Improving (1), Review of Psycho-Social Stressors (1), Review or order clinical lab tests (1), Decision to obtain old records (1), Review of Last Therapy Session (1), Review of Medication Regimen & Side Effects (2) and Review of New Medication or Change in Dosage (2)  Assessment: Axis I: Chronic schizophrenia paranoid type  Axis II: Deferred  Axis III:  Patient Active Problem List   Diagnosis Date Noted  . Arthritis 08/04/2013  . Hip pain, right 08/04/2013  . Cervical muscle pain 08/04/2013  . Episodic lightheadedness 04/15/2013  . 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 MICROCYTOSIS 02/24/2010  . BENIGN PROSTATIC HYPERTROPHY, HX OF 03/24/2009  . SCHIZOPHRENIA 10/11/2006  .  DYSLIPIDEMIA 08/28/2006  . DIABETES MELLITUS, TYPE II 04/27/2006  . TOBACCO ABUSE 04/27/2006  . HYPERTENSION 04/27/2006  . LAMINECTOMY, HX OF 04/27/2006    Axis IV: Mild to moderate  Axis V: 55-60   Plan:  I had a long discussion with the patient and his daughter.  I do believe has tremors of getting divorced which could be due to Prolixin.  In the past he was taking Prolixin 25 mg IM and we increased the dose because he was having more paranoia and hallucination.  The patient like to cut down the dose again .  He does not want Cogentin and Artane .  He endorsed that he has opposite reaction with these medications.  We will try reducing Prolixin 25 mg IM every 2 weeks.  However patient and his daughter will closely monitor his behavior.  I also discussed his hemoglobin A1c .  Encouraged to watch his calorie intake and to return to exercise.  Followup in 4 weeks.  Time spent 25 minutes.  More than 50% of the time spent in psychoeducation, counseling and coordination of care.  Discuss safety plan that anytime having active  suicidal thoughts or homicidal thoughts then patient need to call 911 or go to the local emergency room.   Kayelyn Lemon T., MD 01/20/2014

## 2014-01-27 ENCOUNTER — Ambulatory Visit (INDEPENDENT_AMBULATORY_CARE_PROVIDER_SITE_OTHER): Payer: 59 | Admitting: *Deleted

## 2014-01-27 VITALS — BP 132/87 | HR 98 | Ht 73.0 in | Wt 233.0 lb

## 2014-01-27 DIAGNOSIS — F200 Paranoid schizophrenia: Secondary | ICD-10-CM

## 2014-01-27 NOTE — Progress Notes (Signed)
Prolixin 25 mgmg (1 cc),given IM  Right Deltoid  Lot # [id number]  Patient supplied medication - from [place]

## 2014-02-10 ENCOUNTER — Ambulatory Visit (HOSPITAL_COMMUNITY): Payer: Self-pay | Admitting: *Deleted

## 2014-02-11 ENCOUNTER — Ambulatory Visit (INDEPENDENT_AMBULATORY_CARE_PROVIDER_SITE_OTHER): Payer: 59 | Admitting: *Deleted

## 2014-02-11 VITALS — BP 112/75 | HR 91 | Ht 73.0 in | Wt 231.0 lb

## 2014-02-11 DIAGNOSIS — F200 Paranoid schizophrenia: Secondary | ICD-10-CM

## 2014-02-11 NOTE — Progress Notes (Signed)
Prolixin 25 mg (1 cc),given IM  LeftDeltoid  Lot # [id number]  Patient supplied medication - from [place]

## 2014-02-24 ENCOUNTER — Ambulatory Visit (INDEPENDENT_AMBULATORY_CARE_PROVIDER_SITE_OTHER): Payer: 59 | Admitting: *Deleted

## 2014-02-24 ENCOUNTER — Ambulatory Visit (INDEPENDENT_AMBULATORY_CARE_PROVIDER_SITE_OTHER): Payer: 59 | Admitting: Psychiatry

## 2014-02-24 ENCOUNTER — Encounter (HOSPITAL_COMMUNITY): Payer: Self-pay | Admitting: Psychiatry

## 2014-02-24 VITALS — BP 137/93 | HR 101 | Ht 73.0 in | Wt 228.2 lb

## 2014-02-24 DIAGNOSIS — F200 Paranoid schizophrenia: Secondary | ICD-10-CM

## 2014-02-24 MED ORDER — FLUPHENAZINE DECANOATE 25 MG/ML IJ SOLN: 37.5000 mg | INTRAMUSCULAR | Status: DC

## 2014-02-24 NOTE — Progress Notes (Signed)
[facility] [phone] Progress Note  Wayne Schmidt [id number] 58 y.o.  02/24/2014 11:22 AM  Chief Complaint: I'm not sleeping well.         History of Present Illness: Wayne Schmidt came for his followup appointment.  On his last visit.  He is Prolixin dose because he was complaining of tremors and shakes.  He is taking 25 mg intramuscular every 2 weeks.  I have noticed that he is more irritable, guarded and paranoid.  He is also complaining of poor sleep.  During the conversation he gets irritable for no reason.  He also mentioned that he wants to come off of the medication because he does not have any psychiatric illness.  Later he mentioned that he is not sleeping very well and sometime he admitted having hallucination .  He remains very guarded about his psychiatric illness.  Despite lowering Prolixin his tremor remains the same.  He is not interested to take Cogentin, or pain or any Vistaril.  He is also not interested to try of cholesterol or any other antipsychotic medication.  He denies any aggressive behavior however admitted more isolated and withdrawn.  He lives by himself.  He does not drink or use any legal substances.  His vitals are stable.  He has gained weight from the past which he believes lack of exercise and keeping to himself at his place.  Suicidal Ideation: No Plan Formed: No Patient has means to carry out plan: No  Homicidal Ideation: No Plan Formed: No Patient has means to carry out plan: No  Review of Systems  Skin: Negative for itching.  Neurological: Positive for tremors.  Psychiatric/Behavioral:       Paranoia   Psychiatric: Agitation: Irritability Hallucination: Yes Depressed Mood: No Insomnia: Yes Hypersomnia: No Altered Concentration: No Feels Worthless: No Grandiose Ideas: No Belief In Special Powers: No New/Increased Substance Abuse: No Compulsions: No  Neurologic: Headache: No Seizure: No Paresthesias: No  Medical history:  Patient  has a history of diabetes which is diet controlled, high cholesterol, hypertension, prostate hypertrophy and laminectomy.  Outpatient Encounter Prescriptions as of 02/24/2014  Medication Sig  . ciclopirox (LOPROX) 0.77 % SUSP   . ciclopirox (PENLAC) 8 % solution Apply topically at bedtime. Apply over nail and surrounding skin. Apply daily over previous coat. After seven (7) days, may remove with alcohol and continue cycle.  . diclofenac sodium (VOLTAREN) 1 % GEL Apply 4 g topically 4 (four) times daily.  Marland Kitchen doxazosin (CARDURA) 2 MG tablet TAKE 1 TABLET (2 MG TOTAL) BY MOUTH AT BEDTIME.  . Enalapril-Hydrochlorothiazide 5-12.5 MG per tablet TAKE 1 TABLET BY MOUTH DAILY.  . fluPHENAZine decanoate (PROLIXIN) 25 MG/ML injection Inject 1.5 mLs (37.5 mg total) into the muscle every 14 (fourteen) days.  Marland Kitchen ibuprofen (ADVIL,MOTRIN) 800 MG tablet Take 1 tablet (800 mg total) by mouth every 6 (six) hours as needed for mild pain.  . pravastatin (PRAVACHOL) 40 MG tablet TAKE 1 TABLET (40 MG TOTAL) BY MOUTH DAILY.  . [DISCONTINUED] fluPHENAZine decanoate (PROLIXIN) 25 MG/ML injection Inject 1 mL (25 mg total) into the muscle every 14 (fourteen) days.    Past Psychiatric History/Hospitalization(s): Anxiety: No Bipolar Disorder: No Depression: Yes Mania: No Psychosis: Yes Schizophrenia: Yes Personality Disorder: No Hospitalization for psychiatric illness: Patient has a long history of psychiatric illness with multiple hospitalization.  He recall at least 16-20 times hospitalized.  He was admitted at [facility] for psychosis hallucination and paranoia.  In the past he had tried Haldol and  Thorazine but endorse that he was allergic to these medication.  He is taking Prolixin injection for past 30 years.  He has at least 2 psychiatric admission because of suicidal attempt when he took overdose on his medication. History of Electroconvulsive Shock Therapy: No Prior Suicide Attempts: At least 2 psychiatric  admission do to suicidal attempt when he took overdose on his medication.  Physical Exam: Constitutional:  BP 137/93  Pulse 101  Ht 6\' 1"  (1.854 m)  Wt 228 lb 3.2 oz (103.511 kg)  BMI 30.11 kg/m2  No results found for this or any previous visit (from the past 2160 hour(s)).  General Appearance: well nourished and Casually dressed and fairly groomed.  He remains guarded and fair eye contact.    Musculoskeletal: Strength & Muscle Tone: within normal limits Gait & Station: normal Patient leans: N/A   Mental status examination Patient is well dressed and groomed.  He appears guarded and withdrawn.  He is easily irritable .  He endorsed some time hallucination but did not talk more in details about his hallucination and paranoia.  His speech is rambling and he has some thought blocking.  He describes his mood frustrated and his affect is constricted.  He appears paranoid and preoccupied to his thinking and religious belief .  His thought process is slow and circumstantial.  His psychomotor activity is slow.  His fund of knowledge is average.  He has tremors mostly on his left hand.  His attention and concentration is fair.  He is alert and oriented x3.  His insight judgment and impulse control is okay.  Established Problem, Stable/Improving (1), Review and summation of old records (2), Established Problem, Worsening (2), Review of Last Therapy Session (1), Review of Medication Regimen & Side Effects (2) and Review of New Medication or Change in Dosage (2)  Assessment: Axis I: Chronic schizophrenia paranoid type  Axis II: Deferred  Axis III:  Patient Active Problem List   Diagnosis Date Noted  . Arthritis 08/04/2013  . Hip pain, right 08/04/2013  . Cervical muscle pain 08/04/2013  . Episodic lightheadedness 04/15/2013  . 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 MICROCYTOSIS 02/24/2010  . BENIGN PROSTATIC HYPERTROPHY, HX OF 03/24/2009  . SCHIZOPHRENIA 10/11/2006  . DYSLIPIDEMIA  08/28/2006  . DIABETES MELLITUS, TYPE II 04/27/2006  . TOBACCO ABUSE 04/27/2006  . HYPERTENSION 04/27/2006  . LAMINECTOMY, HX OF 04/27/2006    Axis IV: Mild to moderate  Axis V: 55-60   Plan:  I do believe patient is slowly decompensating since Prolixin dose was reduced.  After some discussion he agreed to go back on 37.5 mg every 2 weeks.  He is not interested to take nystatin, Cogentin or Artane.  He is also not interested changes antipsychotic medication.  The patient was stable on Prolixin 37.5 mg every 2 weeks.  His dose was reduced because he was complaining of tremors.  His tremor has not been changed since the dose was reduced.  He has actually gained weight because of lack of exercise and walking.  Discuss in detail the risks and benefits of medication.  Recommended to call us back if he has any question or any concern.  I will see him again in 2 months.  Time spent 25 minutes.  More than 50% of the time spent in psychoeducation, counseling and coordination of care.  Discuss safety plan that anytime having active suicidal thoughts or homicidal thoughts then patient need to call 911 or go to the local emergency room.  Arletha Marschke T., MD 02/24/2014

## 2014-02-24 NOTE — Progress Notes (Signed)
Prolixin 37.5 mg (1.5 cc),given IM  [Dose increased by MD 02/24/14] Right Deltoid   Lot # [id number] Patient supplied medication - from [place]

## 2014-03-10 ENCOUNTER — Ambulatory Visit (INDEPENDENT_AMBULATORY_CARE_PROVIDER_SITE_OTHER): Payer: 59 | Admitting: *Deleted

## 2014-03-10 VITALS — BP 146/91 | HR 80 | Ht 73.0 in | Wt 232.2 lb

## 2014-03-10 DIAGNOSIS — F200 Paranoid schizophrenia: Secondary | ICD-10-CM

## 2014-03-10 NOTE — Progress Notes (Signed)
Prolixin 37.5 mg (1.5 cc),given IM  Left Deltoid  Lot # [id number]  Patient supplied medication - from [place]

## 2014-03-24 ENCOUNTER — Ambulatory Visit (INDEPENDENT_AMBULATORY_CARE_PROVIDER_SITE_OTHER): Payer: 59 | Admitting: *Deleted

## 2014-03-24 VITALS — BP 141/95 | HR 88 | Ht 73.0 in | Wt 232.0 lb

## 2014-03-24 DIAGNOSIS — F200 Paranoid schizophrenia: Secondary | ICD-10-CM

## 2014-03-24 NOTE — Progress Notes (Signed)
Prolixin 37.5 mg (1.5 cc),given IM   Right Deltoid   0.5 cc from Lot # [id number], Exp 02/17 and 1 cc from Lot # [id number], Exp 02/17 Patient supplied medication - from [place]

## 2014-04-01 ENCOUNTER — Ambulatory Visit (INDEPENDENT_AMBULATORY_CARE_PROVIDER_SITE_OTHER): Payer: PRIVATE HEALTH INSURANCE | Admitting: Internal Medicine

## 2014-04-01 ENCOUNTER — Encounter: Payer: Self-pay | Admitting: Internal Medicine

## 2014-04-01 VITALS — BP 143/92 | HR 91 | Temp 98.2°F | Wt 230.8 lb

## 2014-04-01 DIAGNOSIS — E785 Hyperlipidemia, unspecified: Secondary | ICD-10-CM

## 2014-04-01 DIAGNOSIS — F17200 Nicotine dependence, unspecified, uncomplicated: Secondary | ICD-10-CM

## 2014-04-01 DIAGNOSIS — Z23 Encounter for immunization: Secondary | ICD-10-CM

## 2014-04-01 DIAGNOSIS — Z0000 Encounter for general adult medical examination without abnormal findings: Secondary | ICD-10-CM

## 2014-04-01 DIAGNOSIS — F209 Schizophrenia, unspecified: Secondary | ICD-10-CM

## 2014-04-01 DIAGNOSIS — I10 Essential (primary) hypertension: Secondary | ICD-10-CM

## 2014-04-01 DIAGNOSIS — E119 Type 2 diabetes mellitus without complications: Secondary | ICD-10-CM

## 2014-04-01 LAB — CBC
HEMATOCRIT: 46 % (ref 39.0–52.0)
Hemoglobin: 15.3 g/dL (ref 13.0–17.0)
MCH: 24.2 pg — ABNORMAL LOW (ref 26.0–34.0)
MCHC: 33.3 g/dL (ref 30.0–36.0)
MCV: 72.9 fL — AB (ref 78.0–100.0)
Platelets: 166 10*3/uL (ref 150–400)
RBC: 6.31 MIL/uL — ABNORMAL HIGH (ref 4.22–5.81)
RDW: 16.1 % — AB (ref 11.5–15.5)
WBC: 8.8 10*3/uL (ref 4.0–10.5)

## 2014-04-01 LAB — LIPID PANEL
CHOL/HDL RATIO: 2.9 ratio
CHOLESTEROL: 157 mg/dL (ref 0–200)
HDL: 55 mg/dL (ref >39)
LDL Cholesterol: 91 mg/dL (ref 0–99)
Triglycerides: 54 mg/dL (ref <150)
VLDL: 11 mg/dL (ref 0–40)

## 2014-04-01 LAB — POCT GLYCOSYLATED HEMOGLOBIN (HGB A1C): HEMOGLOBIN A1C: 6.6

## 2014-04-01 LAB — GLUCOSE, CAPILLARY: GLUCOSE-CAPILLARY: 108 mg/dL — AB (ref 70–99)

## 2014-04-01 NOTE — Progress Notes (Signed)
   Subjective:    Patient ID: Wayne Schmidt, male    DOB: [birth year], 58 y.o.   MRN: [id number]  HPI  Wayne Schmidt is a 58 year old gentleman with a history of hypertension, diabetes, hyperlipidemia, tobacco abuse, and schizophrenia who presents for a routine checkup.  Please refer to separate problem-list charting for more details.  Review of Systems  Constitutional: Negative for fever and chills.  HENT: Negative for sore throat.   Respiratory: Negative for shortness of breath.   Cardiovascular: Negative for chest pain and palpitations.  Gastrointestinal: Negative for nausea, vomiting, abdominal pain, diarrhea, constipation and blood in stool.  Genitourinary: Negative for dysuria and hematuria.  Skin: Negative for rash.  Neurological: Negative for syncope.  Psychiatric/Behavioral: Negative for suicidal ideas.       Objective:   Physical Exam  Constitutional: He is oriented to person, place, and time. He appears well-developed and well-nourished. No distress.  HENT:  Head: Normocephalic and atraumatic.  Eyes: EOM are normal. Pupils are equal, round, and reactive to light. No scleral icterus.  Neck: Normal range of motion. Neck supple. No thyromegaly present.  Cardiovascular: Normal rate and regular rhythm.  Exam reveals no gallop and no friction rub.   No murmur heard. Pulmonary/Chest: Effort normal. No respiratory distress. He has wheezes ( inspiratory and expiratory). He has no rales.  Abdominal: Soft. Bowel sounds are normal. He exhibits no distension. There is no tenderness. There is no rebound.  Musculoskeletal: Normal range of motion. He exhibits no edema.  Neurological: He is alert and oriented to person, place, and time. No cranial nerve deficit.  Skin: No rash noted.   Filed Vitals:   04/01/14 1324  BP: 143/92  Pulse: 91  Temp: 98.2 F (36.8 C)       Assessment & Plan:  Please refer to separate problem-list charting for more details.

## 2014-04-01 NOTE — Assessment & Plan Note (Signed)
Lab Results  Component Value Date   HGBA1C 6.6 04/01/2014   HGBA1C 6.7 11/17/2013   HGBA1C 6.6 08/04/2013     Assessment: Diabetes control: good control (HgbA1C at goal) Progress toward A1C goal:  at goal Comments: Patient not currently on any pharmacotherapy.  Plan: Continue with the diet and lifestyle modifications.

## 2014-04-01 NOTE — Assessment & Plan Note (Signed)
BP Readings from Last 3 Encounters:  04/01/14 143/92  03/24/14 141/95  03/10/14 146/91    Lab Results  Component Value Date   NA 139 08/04/2013   K 4.5 08/04/2013   CREATININE 1.22 08/04/2013    Assessment: Blood pressure control: controlled Progress toward BP goal:  at goal Comments: Patient currently on enalapril hydrochlorothiazide 5, 12.5 mg.  Plan: Medications:  continue current medications

## 2014-04-01 NOTE — Assessment & Plan Note (Addendum)
Patient currently managed with pravastatin 40 mg. Will reassess ASCVD risk profile pending lipid profile.  Addendum:  Updated lipid profile, along with patient's other risk factors, sets the patient's 10 year ASCVD at 23.4%, warranting moderate to high intensity statin therapy. Patient is already on a moderate intensity statin, so will continue pravastatin 40 mg for now.

## 2014-04-01 NOTE — Assessment & Plan Note (Signed)
  Assessment: Progress toward smoking cessation:  smoking the same amount Barriers to progress toward smoking cessation:    Comments: Patient states that smoking is still a crutch that he needs to deal with she is in his mental disorder.  Plan: Instruction/counseling given:  I counseled patient on the dangers of tobacco use, advised patient to stop smoking, and reviewed strategies to maximize success.

## 2014-04-01 NOTE — Patient Instructions (Signed)
We have performed some blood tests in clinic today, and we will call you with any results that requires to change any of your medications. Please continue to follow up with your psychiatrist.   General Instructions:  Please bring your medicines with you each time you come to clinic.  Medicines may include prescription medications, over-the-counter medications, herbal remedies, eye drops, vitamins, or other pills.  Progress Toward Treatment Goals:  Treatment Goal 11/17/2013  Hemoglobin A1C at goal  Blood pressure at goal  Stop smoking smoking the same amount    Self Care Goals & Plans:  Self Care Goal 11/17/2013  Manage my medications take my medicines as prescribed; bring my medications to every visit; refill my medications on time; follow the sick day instructions if I am sick  Monitor my health keep track of my blood glucose; keep track of my weight; check my feet daily  Eat healthy foods eat more vegetables; eat fruit for snacks and desserts; eat baked foods instead of fried foods; eat foods that are low in salt; drink diet soda or water instead of juice or soda  Be physically active find an activity I enjoy  Stop smoking -    Home Blood Glucose Monitoring 11/17/2013  Check my blood sugar no home glucose monitoring     Care Management & Community Referrals:  No flowsheet data found.

## 2014-04-01 NOTE — Assessment & Plan Note (Addendum)
Patient is stable on fluphenazine and is being followed by psychiatry.  - Patient's last CBC was in February. Will repeat today given rare association of fluphenazine with aplastic anemia.

## 2014-04-01 NOTE — Assessment & Plan Note (Signed)
Flu shot and lipid profile today.

## 2014-04-01 NOTE — Progress Notes (Signed)
INTERNAL MEDICINE TEACHING ATTENDING ADDENDUM - Aldine Contes, MD: I personally saw and evaluated Mr. Pickrell in this clinic visit in conjunction with the resident, Dr. Raelene Bott. I have discussed patient's plan of care with medical resident during this visit. I have confirmed the physical exam findings and have read and agree with the clinic note including the plan with the following addition: - Pt schizophrenic symptoms stable on fluphenazine. Will check CBC today given risk of aplastic anemia on this med - BP is at goal. Will monitor on current meds - Pt counseled on smoking cessation but is not interested in quitting at this time

## 2014-04-07 ENCOUNTER — Ambulatory Visit (HOSPITAL_COMMUNITY): Payer: Self-pay | Admitting: *Deleted

## 2014-04-09 ENCOUNTER — Ambulatory Visit (INDEPENDENT_AMBULATORY_CARE_PROVIDER_SITE_OTHER): Payer: 59 | Admitting: *Deleted

## 2014-04-09 VITALS — BP 142/92 | HR 97 | Wt 231.4 lb

## 2014-04-09 DIAGNOSIS — F200 Paranoid schizophrenia: Secondary | ICD-10-CM

## 2014-04-09 NOTE — Progress Notes (Addendum)
Prolixin 1.59ml (37.5mg ) given in Left deltoid. Pt supplied own medication from [facility]. Lot #[id number]. Denies any issues or complaints.

## 2014-04-21 ENCOUNTER — Encounter (HOSPITAL_COMMUNITY): Payer: Self-pay | Admitting: Psychiatry

## 2014-04-21 ENCOUNTER — Ambulatory Visit (INDEPENDENT_AMBULATORY_CARE_PROVIDER_SITE_OTHER): Payer: 59 | Admitting: *Deleted

## 2014-04-21 ENCOUNTER — Ambulatory Visit (INDEPENDENT_AMBULATORY_CARE_PROVIDER_SITE_OTHER): Payer: 59 | Admitting: Psychiatry

## 2014-04-21 VITALS — BP 144/81 | HR 80 | Ht 73.0 in | Wt 231.6 lb

## 2014-04-21 DIAGNOSIS — F200 Paranoid schizophrenia: Secondary | ICD-10-CM

## 2014-04-21 MED ORDER — FLUPHENAZINE DECANOATE 25 MG/ML IJ SOLN: 37.5000 mg | INTRAMUSCULAR | Status: DC

## 2014-04-21 NOTE — Progress Notes (Signed)
Prolixin 1.37ml (37.5mg )  IM Right deltoid.  Pt supplied own medication from [facility].  Lot #[id number], EXP 08/2015 Denies any issues or complaints.

## 2014-04-21 NOTE — Progress Notes (Signed)
[facility] [phone] Progress Note  Wayne Schmidt [id number] 58 y.o.  04/21/2014 3:20 PM  Chief Complaint: Medication management and followup.           History of Present Illness: Wayne Schmidt came for his followup appointment.  He is getting Prolixin Decanoate 37.5 mg IM every 2 weeks.  He is more calm cooperative and pleasant.  He denies any irritability, anger or any paranoid thinking.  He endorsed that he is sleeping good.  He continues to have mild tremors but they are stable.  He is more relevant in conversation.  He denies any hallucination, agitation or any issues.  He does not want to take any Cogentin.  His appetite is okay.  His mother is stable.  He does not drink or use any illegal substances.  He wants to continue his current psychotropic medication.  He recently seen his primary care physician and there has been no changes in his medication.  His blood work is normal.    Suicidal Ideation: No Plan Formed: No Patient has means to carry out plan: No  Homicidal Ideation: No Plan Formed: No Patient has means to carry out plan: No  Review of Systems  Musculoskeletal: Negative.   Skin: Negative.   Neurological: Positive for tremors.  Psychiatric/Behavioral: Negative for suicidal ideas, hallucinations and substance abuse.   Psychiatric: Agitation: No Hallucination: Yes Depressed Mood: No Insomnia: No Hypersomnia: No Altered Concentration: No Feels Worthless: No Grandiose Ideas: No Belief In Special Powers: No New/Increased Substance Abuse: No Compulsions: No  Neurologic: Headache: No Seizure: No Paresthesias: No  Medical history:  Patient has a history of diabetes which is diet controlled, high cholesterol, hypertension, prostate hypertrophy and laminectomy.  Outpatient Encounter Prescriptions as of 04/21/2014  Medication Sig  . ciclopirox (LOPROX) 0.77 % SUSP   . ciclopirox (PENLAC) 8 % solution Apply topically at bedtime. Apply over nail and  surrounding skin. Apply daily over previous coat. After seven (7) days, may remove with alcohol and continue cycle.  . diclofenac sodium (VOLTAREN) 1 % GEL Apply 4 g topically 4 (four) times daily.  Marland Kitchen doxazosin (CARDURA) 2 MG tablet TAKE 1 TABLET (2 MG TOTAL) BY MOUTH AT BEDTIME.  . Enalapril-Hydrochlorothiazide 5-12.5 MG per tablet TAKE 1 TABLET BY MOUTH DAILY.  . fluPHENAZine decanoate (PROLIXIN) 25 MG/ML injection Inject 1.5 mLs (37.5 mg total) into the muscle every 14 (fourteen) days.  Marland Kitchen ibuprofen (ADVIL,MOTRIN) 800 MG tablet Take 1 tablet (800 mg total) by mouth every 6 (six) hours as needed for mild pain.  . pravastatin (PRAVACHOL) 40 MG tablet TAKE 1 TABLET (40 MG TOTAL) BY MOUTH DAILY.  . [DISCONTINUED] fluPHENAZine decanoate (PROLIXIN) 25 MG/ML injection Inject 1.5 mLs (37.5 mg total) into the muscle every 14 (fourteen) days.    Past Psychiatric History/Hospitalization(s): Anxiety: No Bipolar Disorder: No Depression: Yes Mania: No Psychosis: Yes Schizophrenia: Yes Personality Disorder: No Hospitalization for psychiatric illness: Patient has a long history of psychiatric illness with multiple hospitalization.  He recall at least 16-20 times hospitalized.  He was admitted at [facility] for psychosis hallucination and paranoia.  In the past he had tried Haldol and Thorazine but endorse that he was allergic to these medication.  He is taking Prolixin injection for past 30 years.  He has at least 2 psychiatric admission because of suicidal attempt when he took overdose on his medication. History of Electroconvulsive Shock Therapy: No Prior Suicide Attempts: At least 2 psychiatric admission do to suicidal attempt when he took overdose on  his medication.  Physical Exam: Constitutional:  BP 144/81  Pulse 80  Ht 6\' 1"  (1.854 m)  Wt 231 lb 9.6 oz (105.053 kg)  BMI 30.56 kg/m2  Recent Results (from the past 2160 hour(s))  GLUCOSE, CAPILLARY     Status: Abnormal   Collection Time     04/01/14  1:27 PM      Result Value Ref Range   Glucose-Capillary 108 (*) 70 - 99 mg/dL  POCT GLYCOSYLATED HEMOGLOBIN (HGB A1C)     Status: None   Collection Time    04/01/14  1:35 PM      Result Value Ref Range   Hemoglobin A1C 6.6    LIPID PANEL     Status: None   Collection Time    04/01/14  2:08 PM      Result Value Ref Range   Cholesterol 157  0 - 200 mg/dL   Comment: ATP III Classification:           < 200        mg/dL        Desirable          200 - 239     mg/dL        Borderline High          >= 240        mg/dL        High         Triglycerides 54  <150 mg/dL   HDL 55  >39 mg/dL   Total CHOL/HDL Ratio 2.9     VLDL 11  0 - 40 mg/dL   LDL Cholesterol 91  0 - 99 mg/dL   Comment:       Total Cholesterol/HDL Ratio:CHD Risk                            Coronary Heart Disease Risk Table                                            Men       Women              1/2 Average Risk              3.4        3.3                  Average Risk              5.0        4.4               2X Average Risk              9.6        7.1               3X Average Risk             23.4       11.0     Use the calculated Patient Ratio above and the CHD Risk table      to determine the patient's CHD Risk.     ATP III Classification (LDL):           < 100        mg/dL  Optimal          100 - 129     mg/dL         Near or Above Optimal          130 - 159     mg/dL         Borderline High          160 - 189     mg/dL         High           > 190        mg/dL         Very High        CBC     Status: Abnormal   Collection Time    04/01/14  2:08 PM      Result Value Ref Range   WBC 8.8  4.0 - 10.5 K/uL   RBC 6.31 (*) 4.22 - 5.81 MIL/uL   Hemoglobin 15.3  13.0 - 17.0 g/dL   HCT 46.0  39.0 - 52.0 %   MCV 72.9 (*) 78.0 - 100.0 fL   MCH 24.2 (*) 26.0 - 34.0 pg   MCHC 33.3  30.0 - 36.0 g/dL   RDW 16.1 (*) 11.5 - 15.5 %   Platelets 166  150 - 400 K/uL    General Appearance: well nourished and  Casually dressed and fairly groomed.  He remains guarded and fair eye contact.    Musculoskeletal: Strength & Muscle Tone: within normal limits Gait & Station: normal Patient leans: N/A   Mental status examination Patient is well dressed and groomed.  He is pleasant and cooperative.  He maintained good eye contact.  His speech is slow and sometimes rambling.  He continues to have some time thought blocking but there were no delusions or any paranoia at this time.  He described his mood neutral and his affect is appropriate.  His thought process is slow and circumstantial.  His psychomotor activity is slow.  His fund of knowledge is average.  He has tremors mostly on his left hand.  His attention and concentration is fair.  He is alert and oriented x3.  His insight judgment and impulse control is okay.  Established Problem, Stable/Improving (1), Review of Psycho-Social Stressors (1), Review or order clinical lab tests (1), Review and summation of old records (2), Review of Last Therapy Session (1) and Review of Medication Regimen & Side Effects (2)  Assessment: Axis I: Chronic schizophrenia paranoid type  Axis II: Deferred  Axis III:  Patient Active Problem List   Diagnosis Date Noted  . Health maintenance examination 04/01/2014  . Arthritis 08/04/2013  . Hip pain, right 08/04/2013  . Cervical muscle pain 08/04/2013  . Episodic lightheadedness 04/15/2013  . 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 MICROCYTOSIS 02/24/2010  . BENIGN PROSTATIC HYPERTROPHY, HX OF 03/24/2009  . SCHIZOPHRENIA 10/11/2006  . DYSLIPIDEMIA 08/28/2006  . DIABETES MELLITUS, TYPE II 04/27/2006  . TOBACCO ABUSE 04/27/2006  . HYPERTENSION 04/27/2006  . LAMINECTOMY, HX OF 04/27/2006    Axis IV: Mild to moderate  Axis V: 55-60   Plan:  Patient is slowly improving from the past.  I do not believe tremors are getting worse with increased dose of Prolixin decanoate.  I reviewed his blood work, collateral from his  primary care physician.  The patient is not interested and Cogentin at this time.  I will continue his current psychotropic medication.  Recommended to call us back if he has  any question or any concern.  Followup in 3 months. Time spent 25 minutes.  More than 50% of the time spent in psychoeducation, counseling and coordination of care.  Discuss safety plan that anytime having active suicidal thoughts or homicidal thoughts then patient need to call 911 or go to the local emergency room.   Wayne Basquez T., MD 04/21/2014

## 2014-04-22 ENCOUNTER — Ambulatory Visit (HOSPITAL_COMMUNITY): Payer: Self-pay | Admitting: Psychiatry

## 2014-05-05 ENCOUNTER — Ambulatory Visit (HOSPITAL_COMMUNITY): Payer: Self-pay | Admitting: *Deleted

## 2014-05-07 ENCOUNTER — Ambulatory Visit (INDEPENDENT_AMBULATORY_CARE_PROVIDER_SITE_OTHER): Payer: 59 | Admitting: *Deleted

## 2014-05-07 VITALS — BP 146/75 | HR 77 | Ht 73.0 in | Wt 231.6 lb

## 2014-05-07 DIAGNOSIS — F200 Paranoid schizophrenia: Secondary | ICD-10-CM

## 2014-05-07 NOTE — Progress Notes (Signed)
Prolixin 1.14ml (37.5mg ) IM Left deltoid.  Pt supplied own medication from [facility].  Lot #[id number], EXP 08/2015 Denies any issues or complaints with previous injections

## 2014-05-20 ENCOUNTER — Ambulatory Visit (INDEPENDENT_AMBULATORY_CARE_PROVIDER_SITE_OTHER): Payer: PRIVATE HEALTH INSURANCE | Admitting: Internal Medicine

## 2014-05-20 ENCOUNTER — Encounter: Payer: Self-pay | Admitting: Internal Medicine

## 2014-05-20 ENCOUNTER — Ambulatory Visit (HOSPITAL_COMMUNITY)
Admission: RE | Admit: 2014-05-20 | Discharge: 2014-05-20 | Disposition: A | Discharge status: Home / Self Care | Payer: PRIVATE HEALTH INSURANCE | Source: Ambulatory Visit | Attending: Internal Medicine | Admitting: Internal Medicine

## 2014-05-20 VITALS — BP 143/88 | HR 88 | Temp 97.6°F | Wt 236.2 lb

## 2014-05-20 DIAGNOSIS — I10 Essential (primary) hypertension: Secondary | ICD-10-CM

## 2014-05-20 DIAGNOSIS — J069 Acute upper respiratory infection, unspecified: Secondary | ICD-10-CM

## 2014-05-20 DIAGNOSIS — Z720 Tobacco use: Secondary | ICD-10-CM | POA: Diagnosis not present

## 2014-05-20 DIAGNOSIS — R059 Cough, unspecified: Secondary | ICD-10-CM

## 2014-05-20 DIAGNOSIS — R05 Cough: Secondary | ICD-10-CM | POA: Diagnosis present

## 2014-05-20 MED ORDER — LOSARTAN POTASSIUM-HCTZ 50-12.5 MG PO TABS: 1.0000 | ORAL_TABLET | Freq: Every day | ORAL | Status: DC

## 2014-05-20 NOTE — Assessment & Plan Note (Addendum)
Patient has been prescribed an ACE inhibitor, and he states that he has been having cough along the same time as this prescription, for several years. Patient states that the cough and sometimes gets so bad that he almost feels like his, pass out, but denies any episodes where he has loss consciousness. Patient is also a smoker, reporting that he smokes about a pack and a half a day and has been smoking since he was 58 years old. Patient's last x-ray was in 2013 which was unremarkable for any nodules or active cardiopulmonary disease. Patient also reports some trace blood in his sputum during these coughing spells.  - Discontinue ACE inhibitor and started patient on Hyzaar (hydrochlorothiazide losartan)  - Given patient's extensive smoking history, chest x-ray.  Addendum: Chest x-ray unremarkable for any active cardiopulmonary disease. No nodules noted. Patient according to UPSTF guidelines, would be recommended for a low-dose chest CT for lung cancer screening. Consider ordering at patient's next visit.

## 2014-05-20 NOTE — Progress Notes (Signed)
   Subjective:    Patient ID: Wayne Schmidt, male    DOB: 1956-02-15, 58 y.o.   MRN: [id number]  HPI  Patient is a 58 year old gentleman with history of hypertension, diabetes, hyperlipidemia, tobacco abuse, and schizophrenia who presents with a cough, congestion, sore throat.  Please refer to separate problem-list charting for more details.  Review of Systems  Constitutional: Negative for fever and chills.  HENT: Positive for congestion, rhinorrhea and sore throat.   Respiratory: Negative for shortness of breath.   Cardiovascular: Negative for chest pain and palpitations.  Gastrointestinal: Negative for nausea, vomiting, abdominal pain, diarrhea, constipation and blood in stool.  Genitourinary: Negative for dysuria and hematuria.  Skin: Negative for rash.  Neurological: Negative for syncope.  Psychiatric/Behavioral: Negative for suicidal ideas.       Objective:   Physical Exam  Constitutional: He is oriented to person, place, and time. He appears well-developed and well-nourished. No distress.  HENT:  Head: Normocephalic and atraumatic.  Eyes: EOM are normal. Pupils are equal, round, and reactive to light. No scleral icterus.  Neck: Normal range of motion. Neck supple. No thyromegaly present.  Cardiovascular: Normal rate and regular rhythm.  Exam reveals no gallop and no friction rub.   No murmur heard. Pulmonary/Chest: Effort normal and breath sounds normal. No respiratory distress. He has no wheezes. He has no rales.  Abdominal: Soft. Bowel sounds are normal. He exhibits no distension. There is no tenderness. There is no rebound.  Musculoskeletal: Normal range of motion. He exhibits no edema.  Neurological: He is alert and oriented to person, place, and time. No cranial nerve deficit.  Skin: No rash noted.      Assessment & Plan:  Please refer to separate problem-list charting for more details.

## 2014-05-20 NOTE — Patient Instructions (Signed)
We have changed you off of your enalapril because of the cough that you have been having. We will start you on a new medication called Hyzaar, which is hydrochlorothiazide and losartan. We have also sent you for a chest x-ray and will update you on the results if anything abnormal comes back.   General Instructions:   Please bring your medicines with you each time you come to clinic.  Medicines may include prescription medications, over-the-counter medications, herbal remedies, eye drops, vitamins, or other pills.   Progress Toward Treatment Goals:  Treatment Goal 04/01/2014  Hemoglobin A1C at goal  Blood pressure at goal  Stop smoking smoking the same amount    Self Care Goals & Plans:  Self Care Goal 05/20/2014  Manage my medications take my medicines as prescribed; bring my medications to every visit  Monitor my health -  Eat healthy foods eat foods that are low in salt; eat baked foods instead of fried foods  Be physically active find an activity I enjoy  Stop smoking go to the [employer] (https://scott-booker.info/); call [facility] (1-800-QUIT-NOW)    Home Blood Glucose Monitoring 11/17/2013  Check my blood sugar no home glucose monitoring     Care Management & Community Referrals:  No flowsheet data found.

## 2014-05-20 NOTE — Progress Notes (Signed)
Internal Medicine Clinic Attending  I saw and evaluated the patient.  I personally confirmed the key portions of the history and exam documented by Dr. Raelene Bott and I reviewed pertinent patient test results.  The assessment, diagnosis, and plan were formulated together and I agree with the documentation in the resident's note.

## 2014-05-20 NOTE — Assessment & Plan Note (Addendum)
Patient states that he has been having a constellation of symptoms, including sore throat, congestion, rhinorrhea, and cough for the last 5 days. Patient reports that the symptoms are worse at night and that he takes Advil PM for his symptoms, with moderate relief. Patient states that it has been keeping him up all night because of coughing. Patient reports that he has had allergies in the past but isn't taking anything for it. He also reports that there is a "girl" up the hall was also sick. Patient denies any significant chest pain, dyspnea. Upon further questioning, patient does report that his cough has been going on for several years, since he was initiated on his ACE inhibitor.   - continue supportive treatment as needed.   - Counseled patient that URI should resolve in a couple days.  - Discontinued ACE inhibitor (cough problem on problem list).

## 2014-05-20 NOTE — Assessment & Plan Note (Addendum)
BP Readings from Last 3 Encounters:  05/20/14 143/88  05/07/14 146/75  04/21/14 144/81    Lab Results  Component Value Date   NA 139 08/04/2013   K 4.5 08/04/2013   CREATININE 1.22 08/04/2013    Assessment: Blood pressure control: moderately elevated Progress toward BP goal:    Comments: Patient has been complaining of a chronic cough along the same time as his ACE inhibitor prescription.  Plan: Medications:  condition patient off ACE inhibitor and now on Hyzaar (hydrochlorothiazide olmesartan). Continue patient on doxazosin 2 mg daily at bedtime otherwise.

## 2014-05-21 ENCOUNTER — Ambulatory Visit (INDEPENDENT_AMBULATORY_CARE_PROVIDER_SITE_OTHER): Payer: 59 | Admitting: *Deleted

## 2014-05-21 VITALS — BP 149/97 | HR 93 | Ht 73.0 in | Wt 232.4 lb

## 2014-05-21 DIAGNOSIS — F200 Paranoid schizophrenia: Secondary | ICD-10-CM

## 2014-05-21 NOTE — Progress Notes (Signed)
Prolixin 1.40ml (37.5mg ) IM Right deltoid.  Pt supplied [facility] medication [facility].  Lot #[id number], EXP 08/2015 Denies any issues or complaints with previous injections

## 2014-06-03 ENCOUNTER — Telehealth (HOSPITAL_COMMUNITY): Payer: Self-pay | Admitting: *Deleted

## 2014-06-03 NOTE — Telephone Encounter (Signed)
Opened in error

## 2014-06-04 ENCOUNTER — Ambulatory Visit (INDEPENDENT_AMBULATORY_CARE_PROVIDER_SITE_OTHER): Payer: 59 | Admitting: *Deleted

## 2014-06-04 VITALS — BP 121/90 | HR 95 | Ht 73.0 in | Wt 231.6 lb

## 2014-06-04 DIAGNOSIS — F200 Paranoid schizophrenia: Secondary | ICD-10-CM

## 2014-06-04 NOTE — Progress Notes (Signed)
Prolixin 1.18ml (37.5mg ) IM Left deltoid.  Pt supplied own medication from [facility].  Lot #[id number], EXP 08/2015 Denies any issues or complaints with previous injections

## 2014-06-18 ENCOUNTER — Ambulatory Visit (HOSPITAL_COMMUNITY): Payer: 59 | Admitting: *Deleted

## 2014-06-18 VITALS — BP 146/82 | HR 83 | Ht 73.0 in | Wt 235.8 lb

## 2014-06-18 DIAGNOSIS — F209 Schizophrenia, unspecified: Secondary | ICD-10-CM

## 2014-07-02 ENCOUNTER — Ambulatory Visit (HOSPITAL_COMMUNITY): Payer: 59 | Admitting: *Deleted

## 2014-07-02 ENCOUNTER — Encounter (HOSPITAL_COMMUNITY): Payer: Self-pay

## 2014-07-02 VITALS — BP 138/90 | HR 108 | Ht 73.0 in

## 2014-07-02 DIAGNOSIS — F200 Paranoid schizophrenia: Secondary | ICD-10-CM

## 2014-07-02 NOTE — Progress Notes (Unsigned)
Prolixin 1.57ml (37.5mg ) IM Left deltoid  Pt supplied own medication from [facility].  Lot #[id number], EXP 01/2016

## 2014-07-15 ENCOUNTER — Encounter: Payer: Self-pay | Admitting: Internal Medicine

## 2014-07-15 ENCOUNTER — Ambulatory Visit (INDEPENDENT_AMBULATORY_CARE_PROVIDER_SITE_OTHER): Payer: Medicare Other | Admitting: Internal Medicine

## 2014-07-15 VITALS — BP 139/84 | HR 98 | Temp 97.9°F | Ht 73.0 in | Wt 237.8 lb

## 2014-07-15 DIAGNOSIS — E119 Type 2 diabetes mellitus without complications: Secondary | ICD-10-CM | POA: Diagnosis not present

## 2014-07-15 DIAGNOSIS — M542 Cervicalgia: Secondary | ICD-10-CM

## 2014-07-15 DIAGNOSIS — M25551 Pain in right hip: Secondary | ICD-10-CM | POA: Diagnosis not present

## 2014-07-15 DIAGNOSIS — M1990 Unspecified osteoarthritis, unspecified site: Secondary | ICD-10-CM | POA: Diagnosis not present

## 2014-07-15 DIAGNOSIS — Z720 Tobacco use: Secondary | ICD-10-CM

## 2014-07-15 DIAGNOSIS — I10 Essential (primary) hypertension: Secondary | ICD-10-CM

## 2014-07-15 DIAGNOSIS — F17200 Nicotine dependence, unspecified, uncomplicated: Secondary | ICD-10-CM

## 2014-07-15 LAB — GLUCOSE, CAPILLARY: Glucose-Capillary: 173 mg/dL — ABNORMAL HIGH (ref 70–99)

## 2014-07-15 LAB — POCT GLYCOSYLATED HEMOGLOBIN (HGB A1C): Hemoglobin A1C: 7.1

## 2014-07-15 MED ORDER — DICLOFENAC SODIUM 1 % TD GEL: 4.0000 g | Freq: Four times a day (QID) | TRANSDERMAL | Status: DC

## 2014-07-15 MED ORDER — IBUPROFEN 800 MG PO TABS: 800.0000 mg | ORAL_TABLET | Freq: Three times a day (TID) | ORAL | Status: DC | PRN

## 2014-07-15 MED ORDER — METFORMIN HCL 500 MG PO TABS
500.0000 mg | ORAL_TABLET | Freq: Two times a day (BID) | ORAL | Status: DC
Start: 2014-07-15 — End: 2016-01-11
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
## 2014-07-15 NOTE — Assessment & Plan Note (Signed)
Patient's lipid profile from 3 months ago revealed: cholesterol 157, triglycerides 54, HDL 55, LDL 91. This, along with his other health history at this point, puts him at a 38.2% 10 year ASCVD risk. If he had an A1c >7.5%, a high-intensity statin would be recommended. His A1c today was increased, but still 7.1%.   - Address A1c with initiation of Metformin and diet education - Continue moderate intensity statin (pravastatin 40 mg by mouth daily)  - Consider high intensity statin once A1c is rechecked, consider lipid panel on next appt
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
## 2014-07-15 NOTE — Patient Instructions (Addendum)
Please start taking metfromin 500 mg by mouth once with breakfast and once with dinner.  Please try using the voltaren gel to relieve the pain in your right thigh.  You have a referral to sports medicine as well for your right thigh pain.  Basic Carbohydrate Counting for Diabetes Mellitus Carbohydrate counting is a method for keeping track of the amount of carbohydrates you eat. Eating carbohydrates naturally increases the level of sugar (glucose) in your blood, so it is important for you to know the amount that is okay for you to have in every meal. Carbohydrate counting helps keep the level of glucose in your blood within normal limits. The amount of carbohydrates allowed is different for every person. A dietitian can help you calculate the amount that is right for you. Once you know the amount of carbohydrates you can have, you can count the carbohydrates in the foods you want to eat. Carbohydrates are found in the following foods:  Grains, such as breads and cereals.  Dried beans and soy products.  Starchy vegetables, such as potatoes, peas, and corn.  Fruit and fruit juices.  Milk and yogurt.  Sweets and snack foods, such as cake, cookies, candy, chips, soft drinks, and fruit drinks. CARBOHYDRATE COUNTING There are two ways to count the carbohydrates in your food. You can use either of the methods or a combination of both. Reading the "Nutrition Facts" on [place] The "Nutrition Facts" is an area that is included on the labels of almost all packaged food and beverages in the [place]. It includes the serving size of that food or beverage and information about the nutrients in each serving of the food, including the grams (g) of carbohydrate per serving.  Decide the number of servings of this food or beverage that you will be able to eat or drink. Multiply that number of servings by the number of grams of carbohydrate that is listed on the label for that serving. The total will  be the amount of carbohydrates you will be having when you eat or drink this food or beverage. Learning Standard Serving Sizes of Food When you eat food that is not packaged or does not include "Nutrition Facts" on the label, you need to measure the servings in order to count the amount of carbohydrates.A serving of most carbohydrate-rich foods contains about 15 g of carbohydrates. The following list includes serving sizes of carbohydrate-rich foods that provide 15 g ofcarbohydrate per serving:   1 slice of bread (1 oz) or 1 six-inch tortilla.    of a hamburger bun or English muffin.  4-6 crackers.   cup unsweetened dry cereal.    cup hot cereal.   cup rice or pasta.    cup mashed potatoes or  of a large baked potato.  1 cup fresh fruit or one small piece of fruit.    cup canned or frozen fruit or fruit juice.  1 cup milk.   cup plain fat-free yogurt or yogurt sweetened with artificial sweeteners.   cup cooked dried beans or starchy vegetable, such as peas, corn, or potatoes.  Decide the number of standard-size servings that you will eat. Multiply that number of servings by 15 (the grams of carbohydrates in that serving). For example, if you eat 2 cups of strawberries, you will have eaten 2 servings and 30 g of carbohydrates (2 servings x 15 g = 30 g). For foods such as soups and casseroles, in which more than one food is mixed  in, you will need to count the carbohydrates in each food that is included. EXAMPLE OF CARBOHYDRATE COUNTING Sample Dinner  3 oz chicken breast.   cup of brown rice.   cup of corn.  1 cup milk.   1 cup strawberries with sugar-free whipped topping.  Carbohydrate Calculation Step 1: Identify the foods that contain carbohydrates:   Rice.   Corn.   Milk.   Strawberries. Step 2:Calculate the number of servings eaten of each:   2 servings of rice.   1 serving of corn.   1 serving of milk.   1 serving of  strawberries. Step 3: Multiply each of those number of servings by 15 g:   2 servings of rice x 15 g = 30 g.   1 serving of corn x 15 g = 15 g.   1 serving of milk x 15 g = 15 g.   1 serving of strawberries x 15 g = 15 g. Step 4: Add together all of the amounts to find the total grams of carbohydrates eaten: 30 g + 15 g + 15 g + 15 g = 75 g. Document Released: 06/19/2005 Document Revised: 11/03/2013 Document Reviewed: 05/16/2013 [facility] Patient Information 2015 [place], [place]. This information is not intended to replace advice given to you by your health care provider. Make sure you discuss any questions you have with your health care provider.

## 2014-07-16 ENCOUNTER — Ambulatory Visit (INDEPENDENT_AMBULATORY_CARE_PROVIDER_SITE_OTHER): Payer: 59 | Admitting: Psychiatry

## 2014-07-16 VITALS — BP 159/99 | HR 84 | Wt 236.4 lb

## 2014-07-16 DIAGNOSIS — F200 Paranoid schizophrenia: Secondary | ICD-10-CM

## 2014-07-16 MED ORDER — FLUPHENAZINE DECANOATE 25 MG/ML IJ SOLN
37.5000 mg | Freq: Once | INTRAMUSCULAR | Status: AC
  Administered 2014-07-16: 37.5 mg via INTRAMUSCULAR

## 2014-07-16 NOTE — Assessment & Plan Note (Signed)
Lab Results  Component Value Date   HGBA1C 7.1 07/15/2014   HGBA1C 6.6 04/01/2014   HGBA1C 6.7 11/17/2013     Assessment: Diabetes control:  worsened Progress toward A1C goal: just above goal; patient was confused about goal (thought he was to increase his A1c to 7.0% (from last POC which was 6.1%)    Plan: Medications:  Start metformin 500 BID. Cr is 1.22. Also emphasized diet choices, importance of resuming prior diet (before patient started trying to increase his A1c). Instruction/counseling given: discussed diet Educational resources provided:  yes

## 2014-07-16 NOTE — Assessment & Plan Note (Signed)
  Assessment: Progress toward smoking cessation: none    Barriers to progress toward smoking cessation:  Patient is pre-contemplative; uses cigarettes late at night    Plan: Instruction/counseling given:  I counseled patient on the dangers of tobacco use, advised patient to stop smoking, and reviewed strategies to maximize success.

## 2014-07-16 NOTE — Progress Notes (Signed)
INTERNAL MEDICINE TEACHING ATTENDING ADDENDUM - Izzabell Klasen, MD: I reviewed and discussed at the time of visit with the resident Dr. Mallory, the patient's medical history, physical examination, diagnosis and results of pertinent tests and treatment and I agree with the patient's care as documented.  

## 2014-07-16 NOTE — Progress Notes (Signed)
D. Patient presented with appropriate affect, level mood and denied any current problems or complaints.  Patient stated starting Metformin on 07/14/14.  Patient reported no problems with Prolixin every 2 week injection.  A. Prolixin 25 mg 1.48ml injection prepared and given as directed in patient's Right deltoid.  R. Patient tolerated without complaint of pain or discomfort.  Will return in 2 weeks for next injeciton.  Lot# [id number], Expires 07/17.

## 2014-07-16 NOTE — Assessment & Plan Note (Signed)
Patient has been stable on fluphenazine injections every 2 weeks and is followed by psychiatry. His sleep has been improving on his resumed, higher dose of fluphenazine. Last CBC (05/2014) revealed WBC 8.8, RBC 6.31 and Plt 166.  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

## 2014-07-16 NOTE — Assessment & Plan Note (Addendum)
BP Readings from Last 3 Encounters:  07/15/14 139/84  07/02/14 138/90  06/18/14 146/82    Lab Results  Component Value Date   NA 139 08/04/2013   K 4.5 08/04/2013   CREATININE 1.22 08/04/2013    Assessment: Blood pressure control:  good  Progress toward BP goal:   at goal Comments: patient no longer experiencing cough off of the ACE-inhibitor; added ACE-i to allergies  Plan: Medications:  continue current medications (losartan-HCTZ 50/12.5 mg by mouth daily and also doxazosin 2 mg by mouth at bedtime)

## 2014-07-22 ENCOUNTER — Ambulatory Visit (INDEPENDENT_AMBULATORY_CARE_PROVIDER_SITE_OTHER): Payer: 59 | Admitting: Psychiatry

## 2014-07-22 ENCOUNTER — Encounter (HOSPITAL_COMMUNITY): Payer: Self-pay | Admitting: Psychiatry

## 2014-07-22 VITALS — BP 130/96 | HR 90 | Ht 73.0 in | Wt 235.0 lb

## 2014-07-22 DIAGNOSIS — F200 Paranoid schizophrenia: Secondary | ICD-10-CM

## 2014-07-22 MED ORDER — FLUPHENAZINE DECANOATE 25 MG/ML IJ SOLN: 37.5000 mg | INTRAMUSCULAR | Status: DC

## 2014-07-22 NOTE — Progress Notes (Signed)
[facility] [id number] Progress Note  Masahiro Iglesia [id number] 59 y.o.  07/22/2014 11:24 AM  Chief Complaint: Medication management and followup.           History of Present Illness: Wayne Schmidt came for his followup appointment.  He is compliant with his Prolixin Decanoate 37.5 mg IM every 2 weeks.  His thinking is clear.  Sometime he is complaining of poor sleep but overall his mood has been stable.  He denies any hallucination or any paranoia.  He had a good Christmas, he visited his friend in [place].  He has some tremors and shakes but he does not want to take Cogentin.  Patient is relevant in conversation.  He denies any panic attack, anger or any severe mood swings.  His appetite is okay.  His vitals are stable.  He recently seen his primary care physician and metformin was admitted because he has high hemoglobin A1c.  He admitted not keeping his diet and her control but now he is more cautious and careful.  Patient lives by himself.  Suicidal Ideation: No Plan Formed: No Patient has means to carry out plan: No  Homicidal Ideation: No Plan Formed: No Patient has means to carry out plan: No  ROS Psychiatric: Agitation: No Hallucination: No Depressed Mood: No Insomnia: No Hypersomnia: No Altered Concentration: No Feels Worthless: No Grandiose Ideas: No Belief In Special Powers: No New/Increased Substance Abuse: No Compulsions: No  Neurologic: Headache: No Seizure: No Paresthesias: No  Medical history:  Patient has a history of diabetes which is diet controlled, high cholesterol, hypertension, prostate hypertrophy and laminectomy.  Outpatient Encounter Prescriptions as of 07/22/2014  Medication Sig  . ciclopirox (LOPROX) 0.77 % SUSP   . ciclopirox (PENLAC) 8 % solution Apply topically at bedtime. Apply over nail and surrounding skin. Apply daily over previous coat. After seven (7) days, may remove with alcohol and continue cycle.  . diclofenac sodium (VOLTAREN) 1  % GEL Apply 4 g topically 4 (four) times daily.  Marland Kitchen doxazosin (CARDURA) 2 MG tablet TAKE 1 TABLET (2 MG TOTAL) BY MOUTH AT BEDTIME.  . fluPHENAZine decanoate (PROLIXIN) 25 MG/ML injection Inject 1.5 mLs (37.5 mg total) into the muscle every 14 (fourteen) days.  Marland Kitchen ibuprofen (ADVIL,MOTRIN) 800 MG tablet Take 1 tablet (800 mg total) by mouth every 8 (eight) hours as needed for moderate pain.  Marland Kitchen losartan-hydrochlorothiazide (HYZAAR) 50-12.5 MG per tablet Take 1 tablet by mouth daily.  . metFORMIN (GLUCOPHAGE) 500 MG tablet Take 1 tablet (500 mg total) by mouth 2 (two) times daily with a meal.  . pravastatin (PRAVACHOL) 40 MG tablet TAKE 1 TABLET (40 MG TOTAL) BY MOUTH DAILY.  . [DISCONTINUED] fluPHENAZine decanoate (PROLIXIN) 25 MG/ML injection Inject 1.5 mLs (37.5 mg total) into the muscle every 14 (fourteen) days.    Past Psychiatric History/Hospitalization(s): Anxiety: No Bipolar Disorder: No Depression: Yes Mania: No Psychosis: Yes Schizophrenia: Yes Personality Disorder: No Hospitalization for psychiatric illness: Patient has a long history of psychiatric illness with multiple hospitalization.  He recall at least 16-20 times hospitalized.  He was admitted at [facility] for psychosis hallucination and paranoia.  In the past he had tried Haldol and Thorazine but endorse that he was allergic to these medication.  He is taking Prolixin injection for past 30 years.  He has at least 2 psychiatric admission because of suicidal attempt when he took overdose on his medication. History of Electroconvulsive Shock Therapy: No Prior Suicide Attempts: At least 2 psychiatric admission do to suicidal  attempt when he took overdose on his medication.  Physical Exam: Constitutional:  BP 130/96 mmHg  Pulse 90  Ht 6\' 1"  (1.854 m)  Wt 235 lb (106.595 kg)  BMI 31.01 kg/m2  Recent Results (from the past 2160 hour(s))  Glucose, capillary     Status: Abnormal   Collection Time: 07/15/14  8:44 AM  Result  Value Ref Range   Glucose-Capillary 173 (H) 70 - 99 mg/dL  POCT HgB A1C     Status: None   Collection Time: 07/15/14  8:53 AM  Result Value Ref Range   Hemoglobin A1C 7.1     General Appearance: alert, oriented, no acute distress and well nourished  Musculoskeletal: Strength & Muscle Tone: within normal limits Gait & Station: normal Patient leans: N/A   Mental status examination Patient is well dressed and groomed.  He is pleasant and cooperative.  He maintained good eye contact.  His speech is slow and sometimes rambling.  He denies any delusion or any paranoia.  He described his mood neutral and his affect is appropriate.  His thought process is slow and circumstantial.  His psychomotor activity is slow.  His fund of knowledge is average.  He has tremors mostly on his left hand.  His attention and concentration is fair.  He is alert and oriented x3.  His insight judgment and impulse control is okay.  Established Problem, Stable/Improving (1), Review or order clinical lab tests (1), Review of Last Therapy Session (1) and Review of Medication Regimen & Side Effects (2)  Assessment: Axis I: Chronic schizophrenia paranoid type  Axis II: Deferred  Axis III:  Patient Active Problem List   Diagnosis Date Noted  . Cough 05/20/2014  . Acute upper respiratory infection 05/20/2014  . Health maintenance examination 04/01/2014  . Arthritis 08/04/2013  . Hip pain, right 08/04/2013  . Cervical muscle pain 08/04/2013  . Episodic lightheadedness 04/15/2013  . 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 MICROCYTOSIS 02/24/2010  . BENIGN PROSTATIC HYPERTROPHY, HX OF 03/24/2009  . SCHIZOPHRENIA 10/11/2006  . DYSLIPIDEMIA 08/28/2006  . Diabetes mellitus without complication 59/16/3846  . TOBACCO ABUSE 04/27/2006  . Essential hypertension 04/27/2006  . LAMINECTOMY, HX OF 04/27/2006    Axis IV: Mild to moderate  Axis V: 55-60   Plan:  Patient is doing better on his medication.  He is not  interested to take any medication for his tremors.  I will continue Prolixin decannulated 37.5 mg IM every 2 weeks.  Recommended to call us back if he has any question or any concern.  Follow-up in 3 months.   Katiejo Gilroy T., MD 07/22/2014

## 2014-07-24 ENCOUNTER — Ambulatory Visit: Payer: Medicare Other | Admitting: Family Medicine

## 2014-07-30 ENCOUNTER — Ambulatory Visit (INDEPENDENT_AMBULATORY_CARE_PROVIDER_SITE_OTHER): Payer: 59 | Admitting: Psychiatry

## 2014-07-30 VITALS — BP 109/80 | HR 93 | Ht 73.0 in | Wt 231.6 lb

## 2014-07-30 DIAGNOSIS — F200 Paranoid schizophrenia: Secondary | ICD-10-CM

## 2014-07-30 MED ORDER — FLUPHENAZINE DECANOATE 25 MG/ML IJ SOLN
37.5000 mg | Freq: Once | INTRAMUSCULAR | Status: AC
  Administered 2014-07-30: 37.5 mg via INTRAMUSCULAR

## 2014-07-30 NOTE — Addendum Note (Signed)
Addended by: Floyde Parkins E on: 07/30/2014 12:20 PM   Modules accepted: Orders, Medications

## 2014-07-31 ENCOUNTER — Ambulatory Visit (INDEPENDENT_AMBULATORY_CARE_PROVIDER_SITE_OTHER): Payer: Medicare Other | Admitting: Family Medicine

## 2014-07-31 ENCOUNTER — Encounter: Payer: Self-pay | Admitting: Family Medicine

## 2014-07-31 VITALS — BP 120/84 | HR 92 | Ht 73.0 in | Wt 231.0 lb

## 2014-07-31 DIAGNOSIS — M25551 Pain in right hip: Secondary | ICD-10-CM | POA: Diagnosis not present

## 2014-07-31 DIAGNOSIS — M25552 Pain in left hip: Secondary | ICD-10-CM | POA: Diagnosis not present

## 2014-07-31 NOTE — Progress Notes (Signed)
Patient ID: Wayne Schmidt, male   DOB: January 30, 1956, 59 y.o.   MRN: [id number]  Wayne Schmidt - 59 y.o. male MRN [id number]  Date of birth: 21-Dec-1955    SUBJECTIVE:     Right hip pain for about the last year. Worse in the last months. Mostly bothers him when he first gets up in the morning and if he stands for long periods of time. Has some occasions when he has a little bit of right-sided lower back pain but does not always coincide with his hip pain. Pain is aching in nature 4-6 out of 10 at its worst. Does not keep him awake at night. Has not really curtailed any of his activities because of the pain. Took some ibuprofen 600 mg that seemed to help some. ROS:     No radiation into the lower leg or foot, no giving way of the right lower extremity. Has noted no erythema or swelling of the right hip. No change in bowel or bladder habits. No fever, sweats, chills.  PERTINENT  PMH / PSH FH / / SH:  Past Medical, Surgical, Social, and Family History Reviewed & Updated in the EMR.  Pertinent findings include:  Chronic schizophrenia currently on treatment with Prolixin. Says his dose was increased within the last few months and he wonders if this is causing his joint pains. Diabetes mellitus Hyperlipidemia Hypertension Smoker No prior history of right hip injury or surgery. He does have history of back surgery: Laminectomy. I do not have any details of that surgery so don't know what level it was.  OBJECTIVE: BP 120/84 mmHg  Pulse 92  Ht 6\' 1"  (1.854 m)  Wt 231 lb (104.781 kg)  BMI 30.48 kg/m2  Physical Exam:  Vital signs are reviewed. GEN.: Well-developed overweight male no acute distress HIPS: Essentially full range of motion and sternal external rotation is painless. Normal strength in hip flexors. GAIT: Normal. No limp. BACK: Nontender to palpation. No defect and soft tissue noted. No muscle spasm is noted. Straight leg raise negative bilaterally in seated and supine position. MSK: Lower  extremity strength 5 out of 5.  ASSESSMENT & PLAN:  See problem based charting & AVS for pt instructions.

## 2014-07-31 NOTE — Patient Instructions (Signed)
I want you to start taking one Tylenol every morning when you get up. It will not interact with any of your other medicines. The maximum dose of Tylenol, 500 mg tablets, would be 6 a day so if you need to take additional Tylenol you can his long as she stay under 6 tablets a day. I definitely want you to take as morning Tylenol to see if it improves your hip stiffness.  I have put an order in for some x-rays. I will send you a note about those. I would like to see you back in the office in about 4-6 weeks.

## 2014-07-31 NOTE — Assessment & Plan Note (Signed)
I suspect this is arthritis. We'll start him on chronic Tylenol. He can continue when necessary ibuprofen. We will set him up for some x-rays and see him back in clinic. If x-rays are negative and he's had no improvement with Tylenol, then we could consider imaging of his low back. He has history in his chart of laminectomy but I can't find any details on that. Symptoms don't seem radicular this time but it is somewhat difficult to get a clear picture from him.

## 2014-08-03 ENCOUNTER — Encounter: Payer: Self-pay | Admitting: *Deleted

## 2014-08-04 ENCOUNTER — Ambulatory Visit: Payer: Self-pay | Admitting: Internal Medicine

## 2014-08-06 ENCOUNTER — Ambulatory Visit
Admission: RE | Admit: 2014-08-06 | Discharge: 2014-08-06 | Disposition: A | Discharge status: Home / Self Care | Payer: Medicare Other | Source: Ambulatory Visit | Attending: Family Medicine | Admitting: Family Medicine

## 2014-08-06 DIAGNOSIS — M25552 Pain in left hip: Principal | ICD-10-CM

## 2014-08-06 DIAGNOSIS — M25551 Pain in right hip: Secondary | ICD-10-CM | POA: Diagnosis not present

## 2014-08-06 DIAGNOSIS — Z87828 Personal history of other (healed) physical injury and trauma: Secondary | ICD-10-CM | POA: Diagnosis not present

## 2014-08-11 DIAGNOSIS — M79674 Pain in right toe(s): Secondary | ICD-10-CM | POA: Diagnosis not present

## 2014-08-11 DIAGNOSIS — B351 Tinea unguium: Secondary | ICD-10-CM | POA: Diagnosis not present

## 2014-08-11 DIAGNOSIS — M79675 Pain in left toe(s): Secondary | ICD-10-CM | POA: Diagnosis not present

## 2014-08-13 ENCOUNTER — Ambulatory Visit (INDEPENDENT_AMBULATORY_CARE_PROVIDER_SITE_OTHER): Payer: 59 | Admitting: Psychiatry

## 2014-08-13 VITALS — BP 154/91 | HR 76 | Wt 235.4 lb

## 2014-08-13 DIAGNOSIS — F200 Paranoid schizophrenia: Secondary | ICD-10-CM

## 2014-08-13 MED ORDER — FLUPHENAZINE DECANOATE 25 MG/ML IJ SOLN: 37.5000 mg | Freq: Once | INTRAMUSCULAR | Status: DC

## 2014-08-13 NOTE — Progress Notes (Signed)
Pt came for injection of Prolixin. No complaints or issues noted. Given IM in Right deltoid. Pt supplied own medication. Lot #[id number].

## 2014-08-27 ENCOUNTER — Other Ambulatory Visit (HOSPITAL_COMMUNITY): Payer: Self-pay

## 2014-08-27 ENCOUNTER — Encounter (HOSPITAL_COMMUNITY): Payer: Self-pay

## 2014-08-27 ENCOUNTER — Ambulatory Visit (INDEPENDENT_AMBULATORY_CARE_PROVIDER_SITE_OTHER): Payer: 59 | Admitting: Psychiatry

## 2014-08-27 VITALS — BP 149/96 | HR 85 | Ht 73.0 in | Wt 235.6 lb

## 2014-08-27 DIAGNOSIS — F200 Paranoid schizophrenia: Secondary | ICD-10-CM

## 2014-08-27 MED ORDER — FLUPHENAZINE DECANOATE 25 MG/ML IJ SOLN: 37.5000 mg | INTRAMUSCULAR | Status: DC

## 2014-08-27 MED ORDER — FLUPHENAZINE DECANOATE 25 MG/ML IJ SOLN
37.5000 mg | INTRAMUSCULAR | Status: DC
  Administered 2014-08-27: 37.5 mg via INTRAMUSCULAR

## 2014-08-27 NOTE — Progress Notes (Signed)
D. Patient presented with appropriate affect, level mood and denied any current auditory or visual hallucinations.  No suicidal or homicidal ideations and reported he had recently had a medication adjustment with his medication for hypertension.  Patient reported he follows up with his PCP on medication to manage his hypertension.  A. Patient's orders Prolixin Decanoate injection of 37.5mg /1.42ml prepared as ordered and given to patient in his Left Deltoid area.  R. Patient tolerated the injection without complaint of pain or discomfort and will return for next injection in 2 weeks.  Patient to call as needed.

## 2014-08-27 NOTE — Addendum Note (Signed)
Addended by: Watt Climes on: 08/27/2014 11:25 AM   Modules accepted: Level of Service

## 2014-08-28 ENCOUNTER — Encounter: Payer: Self-pay | Admitting: Family Medicine

## 2014-08-28 ENCOUNTER — Ambulatory Visit (INDEPENDENT_AMBULATORY_CARE_PROVIDER_SITE_OTHER): Payer: Medicare Other | Admitting: Family Medicine

## 2014-08-28 VITALS — BP 157/93 | HR 73 | Ht 73.0 in | Wt 235.0 lb

## 2014-08-28 DIAGNOSIS — M25551 Pain in right hip: Secondary | ICD-10-CM

## 2014-08-28 NOTE — Assessment & Plan Note (Addendum)
Concern for possible lumbar radiculopathy due to underlying degenerative changes in the lumbar spine. -Plan for MRI of the lumbar spine -He will follow-up after the MRI to go over the results and discuss further treatment options.

## 2014-08-28 NOTE — Progress Notes (Addendum)
   Subjective:    Patient ID: Wayne Schmidt, male    DOB: 08/07/1955, 59 y.o.   MRN: [id number]  HPI SUBJECTIVE:  Wayne Schmidt is a 59 year old male who presents for follow-up of right hip or thigh pain. He was last seen about one month ago, at which point he was started on exercise Tylenol and x-rays of the bilateral hips were obtained. Onset of pain was several months ago without any known acute injury. It does not appear as though he has significant femoral acetabular arthritis. Despite the Tylenol, he says that his pain has persisted. Symptoms are aggravated with walking, or with weather changes. He denies any pain that wakes him up at night. Location of pain is primarily the anterior lateral right thigh. The character is a sharp burning pain. He denies any associated numbness, but says his right upper thigh will feel slightly weak to him at times. He cannot ambulate as far as he formerly was able to a few months ago. He denies any associated weakness, loss of bladder or bowel, fever, sweats, or weight loss.   In 2004 there is report for x-rays of the lumbar spine, which showed facet arthropathy, mild loss of disc height at L4-5, and loss of lumbar lordosis.  History significant for 1.5ppd tobacco use for the past 38 years. He says he had a "laminectomy" in the past.  Past medical history, social history, medications, and allergies were reviewed and are up to date in the chart.  Review of Systems ROS:  No radiation into the lower leg or foot, no giving way of the right lower extremity. Has noted no erythema or swelling of the right hip. No change in bowel or bladder habits. No fever, sweats, chills.    Objective:   Physical Exam BP 157/93 mmHg  Pulse 73  Ht 6\' 1"  (1.854 m)  Wt 235 lb (106.595 kg)  BMI 31.01 kg/m2 GEN: The patient is well-developed well-nourished male and in no acute distress.  He is awake alert and oriented x3. SKIN: warm and well-perfused, no rash  EXTR: No  lower extremity edema or calf tenderness Neuro: Strength 5/5 globally. Sensation intact throughout. DTRs 2/4 bilaterally, except for +1 right patellar tendon reflex. No focal deficits. Vasc: +2 bilateral distal pulses. No edema.  MSK: Examination of the bilateral hips reveals full internal and external range of motion without pain. Mild right-sided lumbar paraspinal tenderness to palpation at the L4 and L5 regions. Negative seated straight leg raise test. No tenderness over the right greater trochanter bursa. Negative pelvic compression test. No leg length discrepancy. No tenderness at the SI joint.     Assessment & Plan:  Please see problem based assessment and plan in the problem list.  Addendum 08/31/14 to more accurately reflex the details of patient visit 08/28/14.

## 2014-08-30 NOTE — Progress Notes (Signed)
[facility]: Attending Note: I have reviewed the chart, discussed wit the Sports Medicine Fellow. I agree with assessment and treatment plan as detailed in the Fellow's note.  

## 2014-09-07 ENCOUNTER — Encounter: Payer: Self-pay | Admitting: *Deleted

## 2014-09-10 ENCOUNTER — Ambulatory Visit (INDEPENDENT_AMBULATORY_CARE_PROVIDER_SITE_OTHER): Payer: 59 | Admitting: Psychiatry

## 2014-09-10 ENCOUNTER — Encounter (HOSPITAL_COMMUNITY): Payer: Self-pay

## 2014-09-10 VITALS — BP 150/92 | HR 63 | Ht 73.0 in | Wt 235.0 lb

## 2014-09-10 DIAGNOSIS — F200 Paranoid schizophrenia: Secondary | ICD-10-CM

## 2014-09-10 MED ORDER — FLUPHENAZINE DECANOATE 25 MG/ML IJ SOLN
37.5000 mg | INTRAMUSCULAR | Status: DC
  Administered 2014-09-10: 37.5 mg via INTRAMUSCULAR

## 2014-09-10 NOTE — Progress Notes (Signed)
D. Patient presented with appropriate affect, level mood and stated he was doing "fine" today.  Patient denied any auditory or visual hallucinations, no thoughts of wanting to harm himself or others and stated he was scheduled to see his internal medicine provider on 09/11/14.  Patient reported doing well with maintaining his apartment but does get friends to help out with picking up things from time-to-time.  Patient reports he has decreased smoking cigarettes but unable to stop and no interest to stop currently.  A. Patient's due Prolixin Decanoate injection of 37.41ml every 2 weeks (1.5ML) prepared as ordered and given to patient in his right deltoid area.  R. Patient tolerated injection without complaint of pain or discomfort and agreed to return in 2 weeks for next due injection.  Patient denied any current active symptoms and will call if any concerns prior to next appointment.

## 2014-09-18 ENCOUNTER — Ambulatory Visit
Admission: RE | Admit: 2014-09-18 | Discharge: 2014-09-18 | Disposition: A | Discharge status: Home / Self Care | Payer: Medicare Other | Source: Ambulatory Visit | Attending: Family Medicine | Admitting: Family Medicine

## 2014-09-18 DIAGNOSIS — M25551 Pain in right hip: Secondary | ICD-10-CM

## 2014-09-18 DIAGNOSIS — M47817 Spondylosis without myelopathy or radiculopathy, lumbosacral region: Secondary | ICD-10-CM | POA: Diagnosis not present

## 2014-09-18 DIAGNOSIS — M5126 Other intervertebral disc displacement, lumbar region: Secondary | ICD-10-CM | POA: Diagnosis not present

## 2014-09-21 ENCOUNTER — Telehealth: Payer: Self-pay | Admitting: Family Medicine

## 2014-09-21 NOTE — Telephone Encounter (Signed)
Rhea His MRI DOES show a bulging disc that is likely causing his rigt thigh pain--see if he has insurance and if so does he want to go to neurosurgeon here in town. If no insurance, we will have to see if we can get him in to [facility] or [facility]. I cannot tell from where I am in chart---maybe you can [facility]! Dorcas Mcmurray

## 2014-09-23 NOTE — Telephone Encounter (Signed)
I have faxed his info to [place] [facility].  We are waiting to hear from them when his appt will be, then I can call him.

## 2014-09-23 NOTE — Telephone Encounter (Signed)
Rhea or [place] Did you get hold of him? Dorcas Mcmurray

## 2014-09-24 ENCOUNTER — Encounter (HOSPITAL_COMMUNITY): Payer: Self-pay | Admitting: Psychiatry

## 2014-09-24 ENCOUNTER — Ambulatory Visit (INDEPENDENT_AMBULATORY_CARE_PROVIDER_SITE_OTHER): Payer: 59 | Admitting: Psychiatry

## 2014-09-24 ENCOUNTER — Other Ambulatory Visit: Payer: Self-pay | Admitting: *Deleted

## 2014-09-24 VITALS — BP 123/81 | HR 88 | Ht 73.0 in | Wt 233.0 lb

## 2014-09-24 DIAGNOSIS — F200 Paranoid schizophrenia: Secondary | ICD-10-CM | POA: Diagnosis not present

## 2014-09-24 MED ORDER — FLUPHENAZINE DECANOATE 25 MG/ML IJ SOLN
37.5000 mg | Freq: Once | INTRAMUSCULAR | Status: AC
  Administered 2014-09-24: 37.5 mg via INTRAMUSCULAR

## 2014-09-24 NOTE — Progress Notes (Signed)
D. Patient presented for due Prolixin 37.5mg  injection every 2 weeks as scheduled.  Patient with appropriate affect, level and pleasant mood and denied any current problems with auditory or visual hallucinations.  No suicidal or homicidal ideations and no current problems with sleep or appetite.  A. Patient's due injection prepared as ordered and given to patient in his Left Deltoid area.  R. Patient tolerated injection without complaint and agreed to return in 2 weeks for next due injection.  Patient stated arms sore some today from scrubbing his apartment floor but denied as a concern at this time.  Patient stable and will call if any any concerns.

## 2014-09-25 MED ORDER — DOXAZOSIN MESYLATE 2 MG PO TABS: ORAL_TABLET | ORAL | Status: DC

## 2014-09-29 ENCOUNTER — Telehealth: Payer: Self-pay | Admitting: Family Medicine

## 2014-09-29 NOTE — Telephone Encounter (Signed)
[place] Can u dbl check on his appt w [facility]---Rhea was waiting on them to call us back with an appointment for him (see last phone note). I do not want it to fall thru  The cracks---remember he is the really slow speaking guy. Thanks for dbl checking Dorcas Mcmurray

## 2014-10-05 NOTE — Telephone Encounter (Signed)
Patient has appt with [facility] on 10/15/14 @ 10:00am with Dr. Joya Salm

## 2014-10-08 ENCOUNTER — Encounter (HOSPITAL_COMMUNITY): Payer: Self-pay | Admitting: Psychiatry

## 2014-10-08 ENCOUNTER — Ambulatory Visit (INDEPENDENT_AMBULATORY_CARE_PROVIDER_SITE_OTHER): Payer: 59 | Admitting: Psychiatry

## 2014-10-08 VITALS — BP 136/87 | HR 83 | Ht 73.0 in | Wt 235.2 lb

## 2014-10-08 DIAGNOSIS — F200 Paranoid schizophrenia: Secondary | ICD-10-CM | POA: Diagnosis not present

## 2014-10-08 MED ORDER — FLUPHENAZINE DECANOATE 25 MG/ML IJ SOLN
37.5000 mg | Freq: Once | INTRAMUSCULAR | Status: AC
  Administered 2014-10-08: 37.5 mg via INTRAMUSCULAR

## 2014-10-08 NOTE — Progress Notes (Signed)
D. Patient presented with appropriate affect, level mood and stated he was going to have to have "some back surgery soon".  Patient stated he had a nerve problem in his lower back causing pain down his legs, burning and discomfort.  Patient reported he had a meeting with neurosurgeon scheduled for the coming week and would know more about the plans after the appointment.  Patient reported no current symptoms, no suicidal or homicidal ideations, no auditory or visual hallucinations and sleep was "okay".  A. Patient's due Prolixin Decanoate 37.5mg  IM injection prepared as ordered and given to patient in his Right Deltoid area.  R. Patient tolerated injection without any complaint of pain or discomfort and requested to return one day early for next injection as will be in [place] for another appointment close by that date.  Agreed would make sure Dr. Adele Schilder okay with 1 day early injection so patient will return on 10/21/14.  Patient stable at this time with no complaints and will call if any problems prior to next appointment.

## 2014-10-12 ENCOUNTER — Ambulatory Visit (INDEPENDENT_AMBULATORY_CARE_PROVIDER_SITE_OTHER): Payer: Medicare Other | Admitting: Internal Medicine

## 2014-10-12 ENCOUNTER — Encounter: Payer: Self-pay | Admitting: Internal Medicine

## 2014-10-12 VITALS — BP 145/80 | HR 85 | Temp 97.6°F | Ht 73.0 in | Wt 235.5 lb

## 2014-10-12 DIAGNOSIS — F17200 Nicotine dependence, unspecified, uncomplicated: Secondary | ICD-10-CM

## 2014-10-12 DIAGNOSIS — I10 Essential (primary) hypertension: Secondary | ICD-10-CM

## 2014-10-12 DIAGNOSIS — F17210 Nicotine dependence, cigarettes, uncomplicated: Secondary | ICD-10-CM | POA: Diagnosis not present

## 2014-10-12 DIAGNOSIS — E119 Type 2 diabetes mellitus without complications: Secondary | ICD-10-CM | POA: Diagnosis not present

## 2014-10-12 DIAGNOSIS — Z0000 Encounter for general adult medical examination without abnormal findings: Secondary | ICD-10-CM

## 2014-10-12 DIAGNOSIS — Z1211 Encounter for screening for malignant neoplasm of colon: Secondary | ICD-10-CM

## 2014-10-12 LAB — GLUCOSE, CAPILLARY: GLUCOSE-CAPILLARY: 135 mg/dL — AB (ref 70–99)

## 2014-10-12 LAB — POCT GLYCOSYLATED HEMOGLOBIN (HGB A1C): Hemoglobin A1C: 6.8

## 2014-10-12 NOTE — Progress Notes (Signed)
Internal Medicine Clinic Attending  Case discussed with Dr. Ngo at the time of the visit.  We reviewed the resident's history and exam and pertinent patient test results.  I agree with the assessment, diagnosis, and plan of care documented in the resident's note. 

## 2014-10-12 NOTE — Progress Notes (Signed)
   Subjective:    Patient ID: Wayne Schmidt, male    DOB: Nov 18, 1955, 59 y.o.   MRN: [id number]  HPI   patient is a 59 year old with a history of diabetes, hypertension, schizophrenia, dyslipidemia, and tobacco abuse who presents to clinic for a blood pressure and diabetes follow-up.  Please see problem based charting for more details.  Review of Systems  Constitutional: Negative for fever and chills.  HENT: Negative for sore throat.   Respiratory: Negative for shortness of breath.   Cardiovascular: Negative for chest pain and palpitations.  Gastrointestinal: Negative for nausea, vomiting, abdominal pain, diarrhea, constipation and blood in stool.  Genitourinary: Negative for dysuria and hematuria.  Skin: Negative for rash.  Neurological: Negative for syncope.  Psychiatric/Behavioral: Negative for suicidal ideas.       Objective:   Physical Exam  Constitutional: He is oriented to person, place, and time. He appears well-developed and well-nourished. No distress.  HENT:  Head: Normocephalic and atraumatic.  Eyes: EOM are normal. Pupils are equal, round, and reactive to light. No scleral icterus.  Neck: Normal range of motion. Neck supple. No thyromegaly present.  Cardiovascular: Normal rate and regular rhythm.  Exam reveals no gallop and no friction rub.   No murmur heard. Pulmonary/Chest: Effort normal and breath sounds normal. No respiratory distress. He has no wheezes. He has no rales.  Abdominal: Soft. Bowel sounds are normal. He exhibits no distension. There is no tenderness. There is no rebound.  Musculoskeletal: Normal range of motion. He exhibits no edema.  Neurological: He is alert and oriented to person, place, and time. No cranial nerve deficit.  Skin: No rash noted.      Assessment & Plan:  Please see problem based charting for more details.

## 2014-10-12 NOTE — Assessment & Plan Note (Signed)
Patient states that he has cut down a little bit now down to about one pack of cigarettes a day. Patient was encouraged to continue reducing the number of cigarettes that he smokes in a day.

## 2014-10-12 NOTE — Assessment & Plan Note (Signed)
BP Readings from Last 3 Encounters:  10/12/14 145/80  10/08/14 136/87  09/24/14 123/81    Lab Results  Component Value Date   NA 139 08/04/2013   K 4.5 08/04/2013   CREATININE 1.22 08/04/2013    Assessment: Blood pressure control: controlled Progress toward BP goal:  unchanged Comments: Patient states that he is compliant with his doxazosin 2 mg daily at bedtime and losartan-hydrochlorothiazide 50-12.5 mg daily.  Plan: Medications:  continue current medications Educational resources provided: brochure (denies) Self management tools provided:   Other plans: None

## 2014-10-12 NOTE — Assessment & Plan Note (Signed)
Lab Results  Component Value Date   HGBA1C 6.8 10/12/2014   HGBA1C 7.1 07/15/2014   HGBA1C 6.6 04/01/2014     Assessment: Diabetes control: good control (HgbA1C at goal) Progress toward A1C goal:  at goal Comments: Patient states that he is compliant with his metformin 500 mg twice a day.  Plan: Medications:  continue current medications Instruction/counseling given: reminded to bring blood glucose meter & log to each visit Educational resources provided: brochure (denies) Self management tools provided:   Other plans: None

## 2014-10-12 NOTE — Patient Instructions (Signed)
Please continue doing a great job in controlling your blood pressure and diabetes. Your A1c was at 6.8 which is at an acceptable level. Your blood pressure was also decently controlled.  Please continue taking all of your medications as prescribed. Please be on the look out for further contact regarding your colonoscopy.  General Instructions:   Please bring your medicines with you each time you come to clinic.  Medicines may include prescription medications, over-the-counter medications, herbal remedies, eye drops, vitamins, or other pills.   Progress Toward Treatment Goals:  Treatment Goal 10/12/2014  Hemoglobin A1C at goal  Blood pressure -  Stop smoking -    Self Care Goals & Plans:  Self Care Goal 10/12/2014  Manage my medications take my medicines as prescribed; bring my medications to every visit; refill my medications on time  Monitor my health -  Eat healthy foods drink diet soda or water instead of juice or soda; eat more vegetables; eat foods that are low in salt; eat baked foods instead of fried foods  Be physically active -  Stop smoking -    Home Blood Glucose Monitoring 11/17/2013  Check my blood sugar no home glucose monitoring     Care Management & Community Referrals:  No flowsheet data found.

## 2014-10-12 NOTE — Assessment & Plan Note (Signed)
Patient states that he had been referred for a colonoscopy in the past but had missed a call from the hospital and would like to reestablish contact regarding this colonoscopy. Review of the record does not reveal any recent referrals for colonoscopy. -Referral for screening colonoscopy.

## 2014-10-15 DIAGNOSIS — Z6832 Body mass index (BMI) 32.0-32.9, adult: Secondary | ICD-10-CM | POA: Diagnosis not present

## 2014-10-15 DIAGNOSIS — M549 Dorsalgia, unspecified: Secondary | ICD-10-CM | POA: Diagnosis not present

## 2014-10-15 DIAGNOSIS — M5416 Radiculopathy, lumbar region: Secondary | ICD-10-CM | POA: Diagnosis not present

## 2014-10-15 DIAGNOSIS — I10 Essential (primary) hypertension: Secondary | ICD-10-CM | POA: Diagnosis not present

## 2014-10-19 ENCOUNTER — Encounter: Payer: Self-pay | Admitting: Internal Medicine

## 2014-10-21 ENCOUNTER — Ambulatory Visit (INDEPENDENT_AMBULATORY_CARE_PROVIDER_SITE_OTHER): Payer: 59 | Admitting: Psychiatry

## 2014-10-21 ENCOUNTER — Encounter (HOSPITAL_COMMUNITY): Payer: Self-pay | Admitting: Psychiatry

## 2014-10-21 VITALS — BP 144/82 | Ht 72.5 in | Wt 237.0 lb

## 2014-10-21 DIAGNOSIS — F200 Paranoid schizophrenia: Secondary | ICD-10-CM

## 2014-10-21 MED ORDER — FLUPHENAZINE DECANOATE 25 MG/ML IJ SOLN: 37.5000 mg | INTRAMUSCULAR | Status: DC

## 2014-10-21 MED ORDER — FLUPHENAZINE DECANOATE 25 MG/ML IJ SOLN
37.5000 mg | Freq: Once | INTRAMUSCULAR | Status: AC
  Administered 2014-10-21: 37.5 mg via INTRAMUSCULAR

## 2014-10-21 NOTE — Progress Notes (Signed)
[facility] [phone] Progress Note  Wayne Schmidt [id number] 59 y.o.  10/21/2014 10:28 AM  Chief Complaint: Medication management and followup.           History of Present Illness: Wayne Schmidt came for his followup appointment.  He is fairly stable on his medication.  He is getting his Prolixin Decanoate 37.5 mg IM every 2 weeks.  He denies any side effects including any tremors, shakes or any EPS.  Lately he is complaining of pinched nerve and back pain and he is scheduled to see Dr. Joya Salm for cortisol injection but he has not heard the appointment date.  Overall he is doing better.  His thinking is much clearer.  He denies any paranoia, suicidal thoughts or any irritability.  Sometime he feel that he is old and he is glad that his daughter is more involved in taking care of him.  He sleeping good.  He denies any feeling of hopelessness or worthlessness.  Sometime he does hear voices which are vague but he is able to distract them.  He described his mood has been calm and stable.  He denies any recent aggression or violence.  His appetite is okay.  His vitals are stable.  He is seeing primary care physician regularly and his hemoglobin A1c is high but stable.  Patient denies drinking or using any illegal substances.  Patient lives by himself however his daughter is helping him.  Suicidal Ideation: No Plan Formed: No Patient has means to carry out plan: No  Homicidal Ideation: No Plan Formed: No Patient has means to carry out plan: No  ROS Psychiatric: Agitation: No Hallucination: Sometimes Depressed Mood: No Insomnia: No Hypersomnia: No Altered Concentration: No Feels Worthless: No Grandiose Ideas: No Belief In Special Powers: No New/Increased Substance Abuse: No Compulsions: No  Neurologic: Headache: No Seizure: No Paresthesias: No  Medical history:  Patient has a history of diabetes which is diet controlled, high cholesterol, hypertension, prostate hypertrophy and  laminectomy.  Outpatient Encounter Prescriptions as of 10/21/2014  Medication Sig  . ciclopirox (LOPROX) 0.77 % SUSP   . diclofenac sodium (VOLTAREN) 1 % GEL Apply 4 g topically 4 (four) times daily.  Marland Kitchen doxazosin (CARDURA) 2 MG tablet TAKE 1 TABLET (2 MG TOTAL) BY MOUTH AT BEDTIME.  . fluPHENAZine decanoate (PROLIXIN) 25 MG/ML injection Inject 1.5 mLs (37.5 mg total) into the muscle every 14 (fourteen) days.  Marland Kitchen ibuprofen (ADVIL,MOTRIN) 800 MG tablet Take 1 tablet (800 mg total) by mouth every 8 (eight) hours as needed for moderate pain.  Marland Kitchen losartan-hydrochlorothiazide (HYZAAR) 50-12.5 MG per tablet Take 1 tablet by mouth daily.  . metFORMIN (GLUCOPHAGE) 500 MG tablet Take 1 tablet (500 mg total) by mouth 2 (two) times daily with a meal.  . pravastatin (PRAVACHOL) 40 MG tablet TAKE 1 TABLET (40 MG TOTAL) BY MOUTH DAILY.  . [DISCONTINUED] fluPHENAZine decanoate (PROLIXIN) 25 MG/ML injection Inject 1.5 mLs (37.5 mg total) into the muscle every 14 (fourteen) days.    Past Psychiatric History/Hospitalization(s): Anxiety: No Bipolar Disorder: No Depression: Yes Mania: No Psychosis: Yes Schizophrenia: Yes Personality Disorder: No Hospitalization for psychiatric illness: Patient has a long history of psychiatric illness with multiple hospitalization.  He recall at least 16-20 times hospitalized.  He was admitted at [facility] for psychosis hallucination and paranoia.  In the past he had tried Haldol and Thorazine but endorse that he was allergic to these medication.  He is taking Prolixin injection for past 30 years.  He has at least  2 psychiatric admission because of suicidal attempt when he took overdose on his medication. History of Electroconvulsive Shock Therapy: No Prior Suicide Attempts: At least 2 psychiatric admission do to suicidal attempt when he took overdose on his medication.  Physical Exam: Constitutional:  BP 144/82 mmHg  Ht 6' 0.5" (1.842 m)  Wt 237 lb (107.502 kg)  BMI  31.68 kg/m2  Recent Results (from the past 2160 hour(s))  Glucose, capillary     Status: Abnormal   Collection Time: 10/12/14 10:48 AM  Result Value Ref Range   Glucose-Capillary 135 (H) 70 - 99 mg/dL  POCT HgB A1C     Status: None   Collection Time: 10/12/14 10:57 AM  Result Value Ref Range   Hemoglobin A1C 6.8     General Appearance: alert, oriented, no acute distress and well nourished  Musculoskeletal: Strength & Muscle Tone: within normal limits Gait & Station: normal Patient leans: N/A   Mental status examination Patient is well dressed and groomed.  He is pleasant and cooperative.  He maintained fair eye contact.  His speech is slow and sometimes rambling.  He denies any delusion or any paranoia.  He described his mood neutral and his affect is appropriate.  His thought process is slow and circumstantial.  His psychomotor activity is slow.  His fund of knowledge is average.  He has tremors mostly on his left hand.  His attention and concentration is fair.  He is alert and oriented x3.  His insight judgment and impulse control is okay.  Established Problem, Stable/Improving (1), Review or order clinical lab tests (1), Review of Last Therapy Session (1) and Review of Medication Regimen & Side Effects (2)  Assessment: Axis I: Chronic schizophrenia paranoid type  Axis II: Deferred  Axis III:  Patient Active Problem List   Diagnosis Date Noted  . Hip pain, bilateral 07/31/2014  . Cough 05/20/2014  . Acute upper respiratory infection 05/20/2014  . Health maintenance examination 04/01/2014  . Arthritis 08/04/2013  . Hip pain, right 08/04/2013  . Cervical muscle pain 08/04/2013  . Episodic lightheadedness 04/15/2013  . 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 MICROCYTOSIS 02/24/2010  . BENIGN PROSTATIC HYPERTROPHY, HX OF 03/24/2009  . SCHIZOPHRENIA 10/11/2006  . DYSLIPIDEMIA 08/28/2006  . Diabetes mellitus without complication 03/55/9741  . TOBACCO ABUSE 04/27/2006  .  Essential hypertension 04/27/2006  . LAMINECTOMY, HX OF 04/27/2006    Plan:  Patient is stable on his medication.  He does not report any side effects of medication.  I will continue Prolixin dec 37.5 mg IM every 2 weeks.  Recommended to call us back if he is any question or any concern.  Follow-up in 3 months.   Jamirah Zelaya T., MD 10/21/2014

## 2014-10-21 NOTE — Progress Notes (Signed)
D. Patient presented with appropriate affect, level and pleasant mood following his scheduled evaluation this date with Dr. Adele Schilder.  Dr. Adele Schilder authorized for patient to receive his Prolixin injection one day early so patient would not have to return on 10/22/14.  New order for Prolixin Decanoate 37.5mg /1.92ml IM injection every 2 weeks authorized this date by Dr. Adele Schilder.  A. Medication injection prepared as ordered and given to patient in his left deltoid area as patient had brought in filled medication vial for administration.  R. Patient tolerated injection without complaint of pain of discomfort and stated "I barely felt that" in regards to injection.  Patient denied any current auditory or visual hallucinations, no suicidal or homicidal ideations and agreed to call if any symptoms or problems prior to next evaluation. Patient to return in 2 weeks for next due injection.

## 2014-11-02 DIAGNOSIS — I10 Essential (primary) hypertension: Secondary | ICD-10-CM | POA: Diagnosis not present

## 2014-11-02 DIAGNOSIS — E782 Mixed hyperlipidemia: Secondary | ICD-10-CM | POA: Diagnosis not present

## 2014-11-02 DIAGNOSIS — Z1211 Encounter for screening for malignant neoplasm of colon: Secondary | ICD-10-CM | POA: Diagnosis not present

## 2014-11-05 ENCOUNTER — Ambulatory Visit (INDEPENDENT_AMBULATORY_CARE_PROVIDER_SITE_OTHER): Payer: 59 | Admitting: Psychiatry

## 2014-11-05 VITALS — BP 156/88 | HR 77 | Ht 72.5 in | Wt 234.8 lb

## 2014-11-05 DIAGNOSIS — F200 Paranoid schizophrenia: Secondary | ICD-10-CM

## 2014-11-05 MED ORDER — FLUPHENAZINE DECANOATE 25 MG/ML IJ SOLN
37.5000 mg | INTRAMUSCULAR | Status: DC
  Administered 2014-11-05: 37.5 mg via INTRAMUSCULAR

## 2014-11-05 NOTE — Progress Notes (Addendum)
D. Patient presented with flat affect, level mood and denied any auditory or visual hallucinations, no suicidal or homicidal ideations and reported sleeping "fine".  Patients blood pressure up as was 156/88 and patient stated he was set to see his primary care provider on 11/18/14 to follow up.  A. Patient's due Prolixin Decanoate 37.5mg /1.46ml prepared as ordered and given to patient in his Right Deltoid.  R. Patient tolerated injection without complaint of pain or discomfort and stated would return in 2 weeks for next injection.  Patient requested Dr. Adele Schilder fill out a form from his housing authority this date and agreed to request his signature and completion of form.  Patient to call if any problems prior to next appointment.  Patient stable at this time with no other stated concerns.

## 2014-11-10 DIAGNOSIS — M79674 Pain in right toe(s): Secondary | ICD-10-CM | POA: Diagnosis not present

## 2014-11-10 DIAGNOSIS — B351 Tinea unguium: Secondary | ICD-10-CM | POA: Diagnosis not present

## 2014-11-10 DIAGNOSIS — M79675 Pain in left toe(s): Secondary | ICD-10-CM | POA: Diagnosis not present

## 2014-11-17 DIAGNOSIS — K635 Polyp of colon: Secondary | ICD-10-CM | POA: Diagnosis not present

## 2014-11-17 DIAGNOSIS — D124 Benign neoplasm of descending colon: Secondary | ICD-10-CM | POA: Diagnosis not present

## 2014-11-17 DIAGNOSIS — Z1211 Encounter for screening for malignant neoplasm of colon: Secondary | ICD-10-CM | POA: Diagnosis not present

## 2014-11-19 ENCOUNTER — Other Ambulatory Visit: Payer: Self-pay | Admitting: Internal Medicine

## 2014-11-19 ENCOUNTER — Encounter: Payer: Self-pay | Admitting: Internal Medicine

## 2014-11-20 ENCOUNTER — Ambulatory Visit (INDEPENDENT_AMBULATORY_CARE_PROVIDER_SITE_OTHER): Payer: 59 | Admitting: Psychiatry

## 2014-11-20 ENCOUNTER — Encounter (HOSPITAL_COMMUNITY): Payer: Self-pay | Admitting: Psychiatry

## 2014-11-20 VITALS — BP 131/89 | HR 92 | Ht 72.5 in | Wt 235.8 lb

## 2014-11-20 DIAGNOSIS — F200 Paranoid schizophrenia: Secondary | ICD-10-CM

## 2014-11-20 MED ORDER — FLUPHENAZINE DECANOATE 25 MG/ML IJ SOLN
37.5000 mg | INTRAMUSCULAR | Status: DC
  Administered 2014-11-20: 37.5 mg via INTRAMUSCULAR

## 2014-11-20 NOTE — Progress Notes (Signed)
Patient presented with flat affect, level mood and denied any current problems with symptoms.  No suicidal or homicidal ideations, no auditory or visual hallucinations and no problems with sleep or appetite.  Discussed patient's status with continued daily smoking and education to assist with stopping if patient interested.  Assessed patient's reported chronic back pain at a 1 today.  Patient reports he manages this and only gets worse when he is moving around a lot.  Patient's due Prolixin Decanoate 37.5mg /1.46ml IM injection prepared as ordered and given to patient in his Left Deltoid as patient reported he was unable to  Come in on 11/19/14 due to another appointment.  Patient requests to get back on Thursdays and wants his next due injection on 12/03/14.  Patient tolerated injection without any reported pain or discomfort and agreed to call if any problems prior to next appointment.  Discussed diet and patient's adherence to medications to improve lipids and hypertension.

## 2014-12-03 ENCOUNTER — Ambulatory Visit (INDEPENDENT_AMBULATORY_CARE_PROVIDER_SITE_OTHER): Payer: 59 | Admitting: Psychiatry

## 2014-12-03 ENCOUNTER — Encounter (HOSPITAL_COMMUNITY): Payer: Self-pay | Admitting: Psychiatry

## 2014-12-03 VITALS — BP 138/88 | HR 86 | Ht 72.5 in | Wt 232.4 lb

## 2014-12-03 DIAGNOSIS — F200 Paranoid schizophrenia: Secondary | ICD-10-CM

## 2014-12-03 MED ORDER — FLUPHENAZINE DECANOATE 25 MG/ML IJ SOLN
37.5000 mg | Freq: Once | INTRAMUSCULAR | Status: AC
  Administered 2014-12-03: 37.5 mg via INTRAMUSCULAR

## 2014-12-03 NOTE — Progress Notes (Signed)
Patient ID: Wayne Schmidt, male   DOB: 1956-06-21, 59 y.o.   MRN: [id number] D. Patient presented with appropriate affect, level mood and denied any auditory or visual hallucinations, no suicidal or homicidal ideations and no problems with sleep or appetite.  Discussed with patient his blood pressure continues to remain elevated at the borderline level as questioned if he was following up on this with his primary care provider.  Patient reported they had canceled his last appointment and had not rescheduled.  Requested patient call their office to follow up as continues to remain somewhat elevated and medications may need adjusting.  A. Patient's due Prolixin Decanoate 37.5mg  IM injection prepared as ordered and given to patient in his Right Deltoid area.  R. Patient tolerated injection without complaint of pain or discomfort and agrees to call PCP to follow up on BP.  Patient also agreed to bring in a refill of his Prolixin Decanoate at next due injection in 2 weeks.  Patient was appreciative to get back on schedule for every 2 weeks as came in one day early today and wants to remain on Thursdays.  Patient stable at this time and to call back as needed.

## 2014-12-08 DIAGNOSIS — M5416 Radiculopathy, lumbar region: Secondary | ICD-10-CM | POA: Diagnosis not present

## 2014-12-08 DIAGNOSIS — M5127 Other intervertebral disc displacement, lumbosacral region: Secondary | ICD-10-CM | POA: Diagnosis not present

## 2014-12-17 ENCOUNTER — Ambulatory Visit (INDEPENDENT_AMBULATORY_CARE_PROVIDER_SITE_OTHER): Payer: 59 | Admitting: Psychiatry

## 2014-12-17 VITALS — Ht 73.5 in | Wt 237.2 lb

## 2014-12-17 DIAGNOSIS — F200 Paranoid schizophrenia: Secondary | ICD-10-CM | POA: Diagnosis not present

## 2014-12-17 MED ORDER — FLUPHENAZINE DECANOATE 25 MG/ML IJ SOLN
37.5000 mg | Freq: Once | INTRAMUSCULAR | Status: AC
  Administered 2014-12-17: 37.5 mg via INTRAMUSCULAR

## 2014-12-17 NOTE — Progress Notes (Signed)
Patient ID: Wayne Schmidt, male   DOB: 12-24-1955, 59 y.o.   MRN: [id number] D. Patient presented with appropriate affect, level mood and denied any suicidal or homicidal ideations and no problems with sleep or appetite.  Patient denied any auditory or visual hallucinations and stated doing okay today.  A. Patient's due Prolixin Decanoate 37.5mg /1.43ml IM injection prepared as ordered and given to patient in his left deltoid area.  R. Patient tolerated injection without complaint of pain or discomfort and agreed to return in 2 weeks.  Patient to call if any problems.

## 2014-12-22 DIAGNOSIS — M5416 Radiculopathy, lumbar region: Secondary | ICD-10-CM | POA: Diagnosis not present

## 2014-12-22 DIAGNOSIS — M5127 Other intervertebral disc displacement, lumbosacral region: Secondary | ICD-10-CM | POA: Diagnosis not present

## 2014-12-31 ENCOUNTER — Ambulatory Visit (INDEPENDENT_AMBULATORY_CARE_PROVIDER_SITE_OTHER): Payer: 59 | Admitting: Psychiatry

## 2014-12-31 VITALS — BP 150/88 | HR 66 | Ht 72.5 in | Wt 234.0 lb

## 2014-12-31 DIAGNOSIS — F200 Paranoid schizophrenia: Secondary | ICD-10-CM | POA: Diagnosis not present

## 2014-12-31 MED ORDER — FLUPHENAZINE DECANOATE 25 MG/ML IJ SOLN
37.5000 mg | INTRAMUSCULAR | Status: DC
  Administered 2014-12-31: 37.5 mg via INTRAMUSCULAR

## 2014-12-31 MED ORDER — FLUPHENAZINE DECANOATE 25 MG/ML IJ SOLN: 37.5000 mg | INTRAMUSCULAR | Status: DC

## 2014-12-31 NOTE — Progress Notes (Signed)
Patient ID: Wayne Schmidt, male   DOB: 19-Oct-1955, 59 y.o.   MRN: [id number] D. Patient presented with flat affect, level mood and admitted to positive auditory hallucinations at times, no visual hallucinations but denied these were bothering him.  Patient denied any suicidal or homicidal ideations and no problems with sleep or appetite.  A. Patient's due Prolixin Decanoate 37.5mg /1.66ml IM injection prepared as ordered and given to patient in his right deltoid area.  R. Patient tolerated injection without complaint of pain or discomfort and agreed to return in 2 weeks for next due injection.  Patient encouraged to contact his PCP to inform his blood pressure is staying elevated and borderline at each evaluation as patient reports taking medication for hypertension daily.  Patient states he does not feel well when it drops too low so encouraged him to discuss this with his PCP to improve monitoring.  Expressed concerns for patient not following up with this and potential cardiac, kidney and other problem concerns related to hypertension not being controlled and patient again agreed to follow up with PCP.  Patient to call back if any problems and will see again in 2 weeks.

## 2015-01-14 ENCOUNTER — Encounter (HOSPITAL_COMMUNITY): Payer: Self-pay | Admitting: Psychiatry

## 2015-01-14 ENCOUNTER — Ambulatory Visit (INDEPENDENT_AMBULATORY_CARE_PROVIDER_SITE_OTHER): Payer: 59 | Admitting: Psychiatry

## 2015-01-14 VITALS — BP 138/86 | HR 75 | Ht 72.5 in | Wt 237.8 lb

## 2015-01-14 DIAGNOSIS — F200 Paranoid schizophrenia: Secondary | ICD-10-CM | POA: Diagnosis not present

## 2015-01-14 MED ORDER — FLUPHENAZINE DECANOATE 25 MG/ML IJ SOLN
37.5000 mg | INTRAMUSCULAR | Status: DC
  Administered 2015-01-14: 37.5 mg via INTRAMUSCULAR

## 2015-01-14 NOTE — Progress Notes (Signed)
Patient ID: Wayne Schmidt, male   DOB: 1955-09-02, 59 y.o.   MRN: [id number] D. Patient presented with flat affect, level mood and denied any current suicidal or homicidal ideations.  Patient admitted to periodic positive auditory hallucinations, denied visual hallucinations and stated these were not bothering him.  Patient was noted with some increased delusional thoughts today as he stated he was the one who wrote [employer] for [place].  Patient stated he was a little frustrated today as well as he has not been able to get the housing authority to come in an paint his apartment in some time but denies any negative thoughts towards then or thoughts to harm anyone.  A.  Patient's due Prolixin Decanoate 37.5mg /1.30ml IM injection prepared as ordered and given to patient in his Left Deltoid area. R. Patient tolerated injection without any complaint of pain or discomfort and agreed to return to see Dr. Adele Schilder for evaluation coming up next week and to return for next injection due in 2 weeks.  Patient stable at this time and will call as needed.  Patient denied any current auditory or visual hallucinations today.

## 2015-01-20 ENCOUNTER — Encounter (HOSPITAL_COMMUNITY): Payer: Self-pay | Admitting: Psychiatry

## 2015-01-20 ENCOUNTER — Ambulatory Visit (INDEPENDENT_AMBULATORY_CARE_PROVIDER_SITE_OTHER): Payer: 59 | Admitting: Psychiatry

## 2015-01-20 VITALS — BP 157/91 | HR 96 | Temp 98.0°F | Resp 19 | Ht 73.0 in | Wt 240.8 lb

## 2015-01-20 DIAGNOSIS — F200 Paranoid schizophrenia: Secondary | ICD-10-CM | POA: Diagnosis not present

## 2015-01-20 NOTE — Progress Notes (Signed)
Wayne Schmidt [id number] Progress Note  Wayne Schmidt [id number] 59 y.o.  01/20/2015 10:20 AM  Chief Complaint: Medication management and followup.           History of Present Illness: Wayne Schmidt came for his followup appointment.  Today he is not sure if he has taken his blood pressure medication because his blood pressure is slightly increased.  However he denies any chest pain, headaches or any other symptoms.  Overall he described his mood has been stable.  He is more calm since he is taking Prolixin injection every 2 weeks.  He has no tremors, shakes.  He sleeps good.  Sometime he complained about pinched nerve and back pain and he has seen Dr. Joya Schmidt for cortisol injection.  He admitted lately he is been watching presidential conventions and sometime he worried about future.  Patient denies any feeling of hopelessness or worthlessness.  He is religiously preoccupied and he endorsed that every night to pray and go to sleep.  He is watching his calories and he believed his blood sugar is much better.  He denies any anger, violence, paranoia or any suicidal thoughts.  Patient denies drinking or using any illegal substances.  He is getting Prolixin injection every 2 weeks.  He has mild tremors on his left hand however there were no EPS .  His appetite is okay.  His weight is stable.  Patient lives by himself however his daughter helps him.  Suicidal Ideation: No Plan Formed: No Patient has means to carry out plan: No  Homicidal Ideation: No Plan Formed: No Patient has means to carry out plan: No  ROS Psychiatric: Agitation: No Hallucination: No Depressed Mood: No Insomnia: No Hypersomnia: No Altered Concentration: No Feels Worthless: No Grandiose Ideas: No Belief In Special Powers: No New/Increased Substance Abuse: No Compulsions: No  Neurologic: Headache: No Seizure: No Paresthesias: No  Medical history:  Patient has a history of diabetes which is diet controlled, high  cholesterol, hypertension, prostate hypertrophy and laminectomy.  Outpatient Encounter Prescriptions as of 01/20/2015  Medication Sig  . ciclopirox (LOPROX) 0.77 % SUSP   . diclofenac sodium (VOLTAREN) 1 % GEL Apply 4 g topically 4 (four) times daily. (Patient not taking: Reported on 11/05/2014)  . doxazosin (CARDURA) 2 MG tablet TAKE 1 TABLET (2 MG TOTAL) BY MOUTH AT BEDTIME.  . fluPHENAZine decanoate (PROLIXIN) 25 MG/ML injection Inject 1.5 mLs (37.5 mg total) into the muscle every 14 (fourteen) days.  Marland Kitchen ibuprofen (ADVIL,MOTRIN) 800 MG tablet Take 1 tablet (800 mg total) by mouth every 8 (eight) hours as needed for moderate pain.  Marland Kitchen losartan-hydrochlorothiazide (HYZAAR) 50-12.5 MG per tablet TAKE 1 TABLET BY MOUTH DAILY.  . metFORMIN (GLUCOPHAGE) 500 MG tablet Take 1 tablet (500 mg total) by mouth 2 (two) times daily with a meal. (Patient not taking: Reported on 11/05/2014)  . pravastatin (PRAVACHOL) 40 MG tablet TAKE 1 TABLET (40 MG TOTAL) BY MOUTH DAILY.   No facility-administered encounter medications on file as of 01/20/2015.    Past Psychiatric History/Hospitalization(s): Anxiety: No Bipolar Disorder: No Depression: Yes Mania: No Psychosis: Yes Schizophrenia: Yes Personality Disorder: No Hospitalization for psychiatric illness: Patient has a long history of psychiatric illness with multiple hospitalization.  He recall at least 16-20 times hospitalized.  He was admitted at [facility] for psychosis hallucination and paranoia.  In the past he had tried Haldol and Thorazine but endorse that he was allergic to these medication.  He is taking Prolixin injection for past 30 years.  He has at least 2 psychiatric admission because of suicidal attempt when he took overdose on his medication. History of Electroconvulsive Shock Therapy: No Prior Suicide Attempts: At least 2 psychiatric admission do to suicidal attempt when he took overdose on his medication.  Physical Exam: Constitutional:  BP  157/91 mmHg  Pulse 96  Temp(Src) 98 F (36.7 C)  Resp 19  Ht 6\' 1"  (1.854 m)  Wt 240 lb 12.8 oz (109.226 kg)  BMI 31.78 kg/m2  SpO2 100%  No results found for this or any previous visit (from the past 2160 hour(s)).  General Appearance: alert, oriented, no acute distress and well nourished  Musculoskeletal: Strength & Muscle Tone: within normal limits Gait & Station: normal Patient leans: N/A   Mental status examination Patient is well dressed and groomed.  He is pleasant and cooperative.  He maintained fair eye contact.  His speech is slow and sometimes rambling.  He denies any delusion or any paranoia.  He described his mood neutral and his affect is appropriate.  His thought process is slow and circumstantial.  His psychomotor activity is slow.  His fund of knowledge is average.  He has mild tremors on his left hand.  His attention and concentration is fair.  He is alert and oriented x3.  His insight judgment and impulse control is okay.  Established Problem, Stable/Improving (1), Review or order clinical lab tests (1), Review of Last Therapy Session (1) and Review of Medication Regimen & Side Effects (2)  Assessment: Axis I: Chronic schizophrenia paranoid type  Axis II: Deferred  Axis III:  Patient Active Problem List   Diagnosis Date Noted  . Paranoid schizophrenia, subchronic condition 12/03/2014  . Hip pain, bilateral 07/31/2014  . Cough 05/20/2014  . Acute upper respiratory infection 05/20/2014  . Health maintenance examination 04/01/2014  . Arthritis 08/04/2013  . Hip pain, right 08/04/2013  . Cervical muscle pain 08/04/2013  . Episodic lightheadedness 04/15/2013  . 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 MICROCYTOSIS 02/24/2010  . BENIGN PROSTATIC HYPERTROPHY, HX OF 03/24/2009  . SCHIZOPHRENIA 10/11/2006  . DYSLIPIDEMIA 08/28/2006  . Diabetes mellitus without complication 16/07/930  . TOBACCO ABUSE 04/27/2006  . Essential hypertension 04/27/2006  .  LAMINECTOMY, HX OF 04/27/2006    Plan:  Patient is stable on his medication.  Encouraged to keep his medication for his blood pressure as prescribed .  Patient like to continue his Prolixin injection which is helping his paranoia, hallucination and mood.  He does not report any side effects of medication.  I will continue Prolixin dec 37.5 mg IM every 2 weeks.  Recommended to call us back if he is any question or any concern.  Follow-up in 3 months.   Catricia Scheerer T., MD 01/20/2015

## 2015-01-28 ENCOUNTER — Ambulatory Visit (INDEPENDENT_AMBULATORY_CARE_PROVIDER_SITE_OTHER): Payer: 59 | Admitting: Psychiatry

## 2015-01-28 VITALS — BP 141/88 | HR 90 | Ht 72.5 in | Wt 236.6 lb

## 2015-01-28 DIAGNOSIS — F200 Paranoid schizophrenia: Secondary | ICD-10-CM

## 2015-01-28 MED ORDER — FLUPHENAZINE DECANOATE 25 MG/ML IJ SOLN
37.5000 mg | Freq: Once | INTRAMUSCULAR | Status: AC
  Administered 2015-01-28: 37.5 mg via INTRAMUSCULAR

## 2015-01-28 MED ORDER — FLUPHENAZINE DECANOATE 25 MG/ML IJ SOLN: 37.5000 mg | INTRAMUSCULAR | Status: DC

## 2015-01-28 NOTE — Addendum Note (Signed)
Addended by: Watt Climes on: 01/28/2015 11:04 AM   Modules accepted: Orders, Level of Service

## 2015-01-28 NOTE — Progress Notes (Signed)
Patient ID: Wayne Schmidt, male   DOB: 11/01/1955, 59 y.o.   MRN: [id number] D. Patient presented with flat affect, level mood and denied any current suicidal or homicidal ideations, no auditory or visual hallucinations and no problems with sleep or appetite.  Patient admitted at times he gets tired of taking Prolixin injections but stays on it as is doing okay for now.  A. Patient's due Fluphenazine Decanoate 37.5mg /1.56ml IM injection prepared as ordered and given to patient in his right deltoid area.  R. Patient tolerated injection without complaint of pain or discomfort and agreed to return in 2 weeks for next due injection.  Patient to call as needed if any problems prior to next appointment.

## 2015-02-04 ENCOUNTER — Other Ambulatory Visit: Payer: Self-pay | Admitting: Internal Medicine

## 2015-02-10 ENCOUNTER — Ambulatory Visit (INDEPENDENT_AMBULATORY_CARE_PROVIDER_SITE_OTHER): Payer: 59 | Admitting: Psychiatry

## 2015-02-10 VITALS — BP 134/88 | HR 95 | Ht 73.0 in | Wt 234.4 lb

## 2015-02-10 DIAGNOSIS — F200 Paranoid schizophrenia: Secondary | ICD-10-CM

## 2015-02-10 MED ORDER — FLUPHENAZINE DECANOATE 25 MG/ML IJ SOLN
37.5000 mg | Freq: Once | INTRAMUSCULAR | Status: AC
  Administered 2015-02-10: 37.5 mg via INTRAMUSCULAR

## 2015-02-10 NOTE — Progress Notes (Signed)
Patient ID: Wayne Schmidt, male   DOB: April 17, 1956, 59 y.o.   MRN: [id number] D. Patient presented with flat affect, level mood and denied any auditory or visual hallucinations this date, no problems with sleep or appetite and denied any other symptoms.  Patient brought in another refill of 15ml vial of Prolixin Decanoate.  A. Patient's due Prolixin Decanoate 37.5mg /1.58ml IM injection prepared as ordered and given to patient in his left deltoid area. R. Patient tolerated injection without complaint of pain or discomfort and agreed to return in 2 weeks for next due injection.  Patient to call as needed and encouraged him to continue to take all medications as prescribed and to monitor his blood pressure as patient reports his PCP is still working on adjusting medications.

## 2015-02-11 DIAGNOSIS — H2513 Age-related nuclear cataract, bilateral: Secondary | ICD-10-CM | POA: Diagnosis not present

## 2015-02-11 DIAGNOSIS — E119 Type 2 diabetes mellitus without complications: Secondary | ICD-10-CM | POA: Diagnosis not present

## 2015-02-11 DIAGNOSIS — H25013 Cortical age-related cataract, bilateral: Secondary | ICD-10-CM | POA: Diagnosis not present

## 2015-02-11 LAB — HM DIABETES EYE EXAM

## 2015-02-25 ENCOUNTER — Ambulatory Visit (INDEPENDENT_AMBULATORY_CARE_PROVIDER_SITE_OTHER): Payer: 59 | Admitting: Psychiatry

## 2015-02-25 VITALS — BP 140/84 | HR 98 | Ht 72.0 in | Wt 238.6 lb

## 2015-02-25 DIAGNOSIS — F200 Paranoid schizophrenia: Secondary | ICD-10-CM

## 2015-02-25 MED ORDER — FLUPHENAZINE DECANOATE 25 MG/ML IJ SOLN
37.5000 mg | Freq: Once | INTRAMUSCULAR | Status: AC
  Administered 2015-02-25: 37.5 mg via INTRAMUSCULAR

## 2015-02-25 NOTE — Progress Notes (Signed)
Patient ID: Wayne Schmidt, male   DOB: May 25, 1956, 59 y.o.   MRN: [id number] D. Patient presented with flat affect, level mood and denied any current problems with auditory or visual hallucinations.  Denied any problems with sleep or appetite and reported things going better in residential placement.  A. Patient's due Prolixin Decanoate 37.5mg /1.20ml injection prepared as ordered and given to patient in his right deltoid area.  R. Patient tolerated injection without complaint of pain or discomfort and agreed to return in 2 weeks for next due injection.  Patient denied any active symptoms at this time and will call if any problems prior to next appointment.

## 2015-03-01 ENCOUNTER — Encounter: Payer: Self-pay | Admitting: *Deleted

## 2015-03-02 ENCOUNTER — Other Ambulatory Visit: Payer: Self-pay | Admitting: Internal Medicine

## 2015-03-11 ENCOUNTER — Ambulatory Visit (INDEPENDENT_AMBULATORY_CARE_PROVIDER_SITE_OTHER): Payer: 59 | Admitting: Psychiatry

## 2015-03-11 VITALS — BP 150/94 | HR 76 | Ht 73.0 in | Wt 240.2 lb

## 2015-03-11 DIAGNOSIS — F200 Paranoid schizophrenia: Secondary | ICD-10-CM

## 2015-03-11 MED ORDER — FLUPHENAZINE DECANOATE 25 MG/ML IJ SOLN: 37.5000 mg | INTRAMUSCULAR | Status: DC

## 2015-03-11 MED ORDER — FLUPHENAZINE DECANOATE 25 MG/ML IJ SOLN
37.5000 mg | Freq: Once | INTRAMUSCULAR | Status: AC
  Administered 2015-03-11: 37.5 mg via INTRAMUSCULAR

## 2015-03-11 NOTE — Progress Notes (Signed)
Patient ID: Wayne Schmidt, male   DOB: 01-09-1956, 59 y.o.   MRN: [id number] D. Patient presented with flat affect, level and pleasant mood and denied any suicidal or homicidal ideations, no auditory or visual hallucinations and no problems with appetite.  Patient reported not sleeping as well as of late but does not think needs to be addressed at this time.  Patient's blood pressure up again today and discussed with patient need to follow up with his primary care provider as this is consistently staying up.  Patient reported being without some medications for a few days as was waiting on refills from PCP to his pharmacy. Requested patient call to request an earlier appointment as he reports they are switching him to a new provider at this PCP office.  A.  Patient's due Prolixin Decanoate 37.5mg /1.57ml IM injection prepared as ordered and given to patient in his left deltoid area.  R. Patient tolerated injection without complaint of pain or discomfort.  Patient encouraged again to please contact his PCP about BP concerns and stated he would.  Patient to call if any problems and will return in 2 weeks for next due injection.

## 2015-03-16 ENCOUNTER — Other Ambulatory Visit: Payer: Self-pay | Admitting: Internal Medicine

## 2015-03-16 NOTE — Telephone Encounter (Signed)
Pt called requesting losartan and pravastatin to be filled.

## 2015-03-16 NOTE — Telephone Encounter (Signed)
Losartan Rx filled. Pravastatin rx requested

## 2015-03-19 ENCOUNTER — Other Ambulatory Visit (HOSPITAL_COMMUNITY): Payer: Self-pay | Admitting: Psychiatry

## 2015-03-25 ENCOUNTER — Ambulatory Visit (INDEPENDENT_AMBULATORY_CARE_PROVIDER_SITE_OTHER): Payer: 59 | Admitting: Psychiatry

## 2015-03-25 DIAGNOSIS — F200 Paranoid schizophrenia: Secondary | ICD-10-CM

## 2015-03-25 MED ORDER — FLUPHENAZINE DECANOATE 25 MG/ML IJ SOLN
37.5000 mg | Freq: Once | INTRAMUSCULAR | Status: AC
  Administered 2015-03-25: 37.5 mg via INTRAMUSCULAR

## 2015-03-25 NOTE — Progress Notes (Signed)
Patient ID: Wayne Schmidt, male   DOB: 1956/05/08, 59 y.o.   MRN: [id number] D. Patient presented with flat affect, level mood and denied any current symptoms.  Patient denied any auditory or visual hallucinations, no suicidal or homicidal ideations and states still has problems with sleep at times.  Discussed with patient concern is blood pressure is consistently staying up as patient admitted he was without medication for over a week but now has been back on BP medications the past few days and states he will discuss with PCP when he returns to see them in November.  Requested patient consider making an earlier appointment and to take medication daily.  A. Patient's due order Prolixin Decanoate 37.5mg /1.82ml IM injection prepared as ordered and given to patient in his right deltoid area.  R. Patient tolerated injection without complaint of pain or discomfort.  Patient reports he would like to go back to Prolixin 25mg  every 2 weeks as thinks this caused problems with weight and will discuss with Dr. Adele Schilder at next evaluation.  Patient stated concern Dr. Adele Schilder is not receptive to decrease but did admit he is more relaxed and stable currently with present 37.5mg /1.29ml dosage.  Patient encouraged again to take medication for hypertension daily and to keep a check on his BP to report to his primary care provider and patient stated understanding.

## 2015-04-08 ENCOUNTER — Encounter: Payer: Self-pay | Admitting: Internal Medicine

## 2015-04-08 ENCOUNTER — Ambulatory Visit (INDEPENDENT_AMBULATORY_CARE_PROVIDER_SITE_OTHER): Payer: 59 | Admitting: Psychiatry

## 2015-04-08 ENCOUNTER — Ambulatory Visit (INDEPENDENT_AMBULATORY_CARE_PROVIDER_SITE_OTHER): Payer: Medicare Other | Admitting: Internal Medicine

## 2015-04-08 VITALS — BP 156/96 | HR 87 | Temp 98.0°F | Ht 73.0 in | Wt 238.3 lb

## 2015-04-08 VITALS — BP 162/102 | HR 76 | Ht 72.5 in | Wt 236.4 lb

## 2015-04-08 DIAGNOSIS — I10 Essential (primary) hypertension: Secondary | ICD-10-CM | POA: Diagnosis not present

## 2015-04-08 DIAGNOSIS — E119 Type 2 diabetes mellitus without complications: Secondary | ICD-10-CM

## 2015-04-08 DIAGNOSIS — Z23 Encounter for immunization: Secondary | ICD-10-CM | POA: Diagnosis not present

## 2015-04-08 DIAGNOSIS — F200 Paranoid schizophrenia: Secondary | ICD-10-CM | POA: Diagnosis not present

## 2015-04-08 DIAGNOSIS — F17200 Nicotine dependence, unspecified, uncomplicated: Secondary | ICD-10-CM

## 2015-04-08 LAB — POCT GLYCOSYLATED HEMOGLOBIN (HGB A1C): Hemoglobin A1C: 7.1

## 2015-04-08 LAB — GLUCOSE, CAPILLARY: GLUCOSE-CAPILLARY: 109 mg/dL — AB (ref 65–99)

## 2015-04-08 MED ORDER — LOSARTAN POTASSIUM-HCTZ 50-12.5 MG PO TABS: 1.0000 | ORAL_TABLET | Freq: Every day | ORAL | Status: DC

## 2015-04-08 MED ORDER — LOSARTAN POTASSIUM-HCTZ 50-12.5 MG PO TABS: 2.0000 | ORAL_TABLET | Freq: Every day | ORAL | Status: DC

## 2015-04-08 MED ORDER — FLUPHENAZINE DECANOATE 25 MG/ML IJ SOLN
37.5000 mg | INTRAMUSCULAR | Status: DC
  Administered 2015-04-08: 37.5 mg via INTRAMUSCULAR

## 2015-04-08 NOTE — Progress Notes (Signed)
   Subjective:    Patient ID: Xzaviar Maloof, male    DOB: 1955-07-21, 59 y.o.   MRN: [id number]  HPI Mr. Jarrells is a 59 year old male with hypertension, tobacco abuse, type 2 diabetes, dyslipidemia who presents today for follow-up visit. Please see assessment & plan for documentation of chronic medical problems.    Review of Systems  Eyes: Negative for visual disturbance.  Respiratory: Negative for shortness of breath.   Cardiovascular: Negative for chest pain and leg swelling.  Gastrointestinal: Negative for nausea, vomiting, abdominal pain, diarrhea and blood in stool.  Genitourinary: Negative for dysuria.  Musculoskeletal: Positive for back pain.  Neurological: Positive for headaches. Negative for dizziness.       Objective:   Physical Exam Constitutional: Middle aged [place] American male. No distress.  Head: Normocephalic and atraumatic.  Eyes: Conjunctivae are normal. Pupils are 4 mm, direct, consensual, near.  Cardiovascular: Normal rate, regular rhythm and normal heart sounds.  No gallop, friction rub, murmur heard. Pulmonary/Chest: Effort normal. No respiratory distress. No wheezes, rales.  Abdominal: Soft. Bowel sounds are normal. No distension. No tenderness.  Neurological: Alert and oriented to person, place, and time. Coordination normal.  Skin: Warm and dry. Not diaphoretic.  Psychiatric: Affect blunted though mobile.     Assessment & Plan:

## 2015-04-08 NOTE — Patient Instructions (Addendum)
Please take two tablets of Hyzaar and come back next week for BP recheck. Try taking Tylenol instead of ibuprofen at night to see if it helps.  Thank you for getting your flu shot.

## 2015-04-08 NOTE — Progress Notes (Signed)
Patient ID: Wayne Schmidt, male   DOB: February 12, 1956, 59 y.o.   MRN: [id number] D. Patient presented with flat affect, level mood and denied any auditory or visual hallucinations or problems with sleep or appetite.  Patient with continued elevated BP so discussed with patient and then Dr. Adele Schilder concern.  Patient signed a consent for his new internal medicine provider, Dr. Collier Salina and agreed to call his PCP to request an earlier appointment for patient and to inform of his status with continuously having elevated BP readings at recent visits.  A. Patient's due Prolixin Decanoate 37.5mg /1.46ml IM injection prepared as ordered and given to patient in his requested right deltoid area.  R. Patient tolerated injection without complaint of pain or discomfort and agreed to return in 2 weeks for next injection.  Patient signed a consent and agreed with plan for this nurse to call his PCP and request an earlier appointment due to continued elevated BPs, feeling flushed and dizzy at times. Call placed to request an earlier PCP appointment for patient.  Called and spoke with May, receptionist at [name] office who reported they could actually get patient in later this date or this week to have patient call them to set up a time.  Called patient who reported he was not sure he could go later today but did agree to call [name] office to try to get in this week.  Patient stated he had the phone number [phone] and reported he would follow up.  Expressed to patient our concerns for continued elevated blood pressures and patient reported he would call [name] office to schedule an appointment.

## 2015-04-09 LAB — BMP8+ANION GAP
Anion Gap: 14 mmol/L (ref 10.0–18.0)
BUN/Creatinine Ratio: 10 (ref 10–22)
BUN: 11 mg/dL (ref 8–27)
CO2: 25 mmol/L (ref 18–29)
Calcium: 9.5 mg/dL (ref 8.6–10.2)
Chloride: 98 mmol/L (ref 97–108)
Creatinine, Ser: 1.12 mg/dL (ref 0.76–1.27)
GFR calc Af Amer: 82 mL/min/{1.73_m2} (ref >59)
GFR, EST NON AFRICAN AMERICAN: 71 mL/min/{1.73_m2} (ref >59)
GLUCOSE: 94 mg/dL (ref 65–99)
POTASSIUM: 4 mmol/L (ref 3.5–5.2)
SODIUM: 137 mmol/L (ref 134–144)

## 2015-04-09 NOTE — Assessment & Plan Note (Addendum)
He was last seen on 4/11 at which time blood pressure was 145/80 on doxazosin 2 mg at bedtime and losartan/HCTZ 50/12.5 mg daily. At his psychiatrist appointment today, he was found to have blood pressure 162/102 and was asked to set up an appointment with [place] today.   156/96 today after he took an additional dose of losartan/HCTZ 50/12.5 mg. He reports taking ibuprofen 800mg  at night time for arthritis, smoking 1 pack/day, consuming 1 beer/week. He denied any changes in vision, energy, appetite, dyspnea though reported a headache.   His dose of losartan/HCTZ was increased 50/12.5mg ->100/25mg , and he was advised to follow-up next week for BP recheck. BMET recheck today. Though ibuprofen worked for him, he was also encouraged to try Tylenol instead at night for his arthritic back pain. He was also explained that this may be a permanent dose change given that it may be in response to the increase dose of his psychotropic medication.   ADDENDUM 04/09/2015  5:30 PM:  BMET reassuring.

## 2015-04-09 NOTE — Assessment & Plan Note (Signed)
He reports smoking 1 pack/day which is down from 2 packs/day. He would like to quit at some time though understands he should not take Chantix. He did not feel patches helped him as he continued to smoke anyhow while wearing them.

## 2015-04-09 NOTE — Assessment & Plan Note (Signed)
He was agreeable to receiving his flu shot today.

## 2015-04-09 NOTE — Assessment & Plan Note (Signed)
Repeat A1c today. 6.8 back in April. Not taking metformin b/c "it didn't agree with me."  ADDENDUM 04/09/2015  5:21 PM:  7.1 today, worse than April, but back to where it was in January. Will address at follow-up.

## 2015-04-15 ENCOUNTER — Ambulatory Visit (INDEPENDENT_AMBULATORY_CARE_PROVIDER_SITE_OTHER): Payer: Medicare Other | Admitting: Internal Medicine

## 2015-04-15 ENCOUNTER — Encounter: Payer: Self-pay | Admitting: Internal Medicine

## 2015-04-15 VITALS — BP 143/79 | HR 96 | Temp 97.8°F | Ht 72.5 in | Wt 238.2 lb

## 2015-04-15 DIAGNOSIS — Z7984 Long term (current) use of oral hypoglycemic drugs: Secondary | ICD-10-CM | POA: Diagnosis not present

## 2015-04-15 DIAGNOSIS — E119 Type 2 diabetes mellitus without complications: Secondary | ICD-10-CM | POA: Diagnosis not present

## 2015-04-15 DIAGNOSIS — F17200 Nicotine dependence, unspecified, uncomplicated: Secondary | ICD-10-CM

## 2015-04-15 DIAGNOSIS — F17210 Nicotine dependence, cigarettes, uncomplicated: Secondary | ICD-10-CM

## 2015-04-15 DIAGNOSIS — I10 Essential (primary) hypertension: Secondary | ICD-10-CM

## 2015-04-15 NOTE — Progress Notes (Signed)
   Subjective:    Patient ID: Sterlin Knightly, male    DOB: 1955/09/20, 59 y.o.   MRN: [id number]  HPI Mr. Mcenroe is a 59 year old male with hypertension, tobacco abuse, type 2 diabetes, dyslipidemia who presents today for follow-up visit. Please see assessment & plan for documentation of chronic medical problems.   Review of Systems  Respiratory: Negative for shortness of breath.   Cardiovascular: Negative for chest pain and leg swelling.  Gastrointestinal: Negative for nausea, vomiting, abdominal pain, diarrhea and blood in stool.  Neurological: Negative for dizziness and headaches.       Objective:   Physical Exam  Constitutional: Middle aged, obese African American male. No distress. Fine tremor of the fingers. Head: Normocephalic and atraumatic.  Eyes: Conjunctivae are normal. Pupils are 3 mm, direct, consensual, near.  Cardiovascular: Normal rate, regular rhythm and normal heart sounds.  No gallop, friction rub, murmur heard. Pulmonary/Chest: Effort normal. No respiratory distress. No wheezes, rales.  Abdominal: Soft. Bowel sounds are normal. No distension. No tenderness.  Neurological: Alert and oriented to person, place, and time. Coordination normal.  Skin: Warm and dry. Not diaphoretic.  Psychiatric: Affect blunted though mobile.         Assessment & Plan:

## 2015-04-15 NOTE — Assessment & Plan Note (Signed)
  Assessment: Progress toward smoking cessation:  smoking less Barriers to progress toward smoking cessation:  stress Comments: Reports he didn't finish his pack of cigarettes yesterday and is willing to cut down if it means it will help his blood pressure and glycemic control.  Plan: Instruction/counseling given:  I counseled patient on the dangers of tobacco use, advised patient to stop smoking, and reviewed strategies to maximize success. Educational resources provided:  [employer] account [employer]) brochure Self management tools provided:    Medications to assist with smoking cessation:  None Patient agreed to the following self-care plans for smoking cessation: cut down the number of cigarettes smoked  Other plans: Continue reassessing at each visit.

## 2015-04-15 NOTE — Assessment & Plan Note (Addendum)
BP Readings from Last 3 Encounters:  04/15/15 143/79  04/08/15 156/96  04/08/15 162/102    Lab Results  Component Value Date   NA 137 04/08/2015   K 4.0 04/08/2015   CREATININE 1.12 04/08/2015    Assessment: Blood pressure control: controlled Progress toward BP goal:  at goal Comments: Improved on increased dose of losartan/HCTZ which he prefers to take one tablet in AM and then PM. Not interested in just taking 1 tablet of 100mg /50mg . Denies any headache, dizziness, problems with medication. Also working on cutting back on his cigarettes to bring down his blood pressure.  Plan: Medications:  losartan/HCTZ 50/12.5mg  twice daily, doxazosin 2mg  [facility] Educational resources provided: brochure, handout, video Self management tools provided:   Other plans: Continue current regimen.

## 2015-04-15 NOTE — Patient Instructions (Signed)
Please continue taking your blood pressure medication the way you have been.  As we discussed, please try walking 30 minutes at least 3 times a week. If things aren't better at your next visit, we can consider switching you to a different type of metformin to help keep things under control.

## 2015-04-15 NOTE — Assessment & Plan Note (Signed)
Lab Results  Component Value Date   HGBA1C 7.1 04/08/2015   HGBA1C 6.8 10/12/2014   HGBA1C 7.1 07/15/2014     Assessment: Diabetes control: fair control Progress toward A1C goal:  deteriorated Comments: Reviewed A1c with him last week. Takes metformin 500mg  twice daily but reports not tolerating higher doses in the past. When asked to clarify, he describes feeling drowsy and dizzy as a result of the medication. Never tried metformin XR. Interested in walking more than he currently does which is 15 min every 3-4 days a week.   Plan: Medications:  metformin 500mg  twice daily Home glucose monitoring: Frequency: no home glucose monitoring Timing: N/A Instruction/counseling given: discussed the need for weight loss and discussed diet Educational resources provided: brochure, handout Self management tools provided:   Other plans: Set a goal to increase walking duration to 30 minutes at least 3 times/week but to consider switching over to metformin XR at his next appointment if things are no better. Strongly feels he can bring down his A1c with exercise as he has in the past. -Counseled him on the risks associated with poorly controlled diabetes and is agreeable to this compromise -Will continue working on cutting back on his cigarettes as well which I encouraged him to share with his PCP at follow-up

## 2015-04-16 NOTE — Progress Notes (Signed)
Internal Medicine Clinic Attending  Case discussed with Dr. Patel,Rushil soon after the resident saw the patient.  We reviewed the resident's history and exam and pertinent patient test results.  I agree with the assessment, diagnosis, and plan of care documented in the resident's note. 

## 2015-04-19 NOTE — Progress Notes (Signed)
Internal Medicine Clinic Attending  Case discussed with Dr. Patel,Rushil soon after the resident saw the patient.  We reviewed the resident's history and exam and pertinent patient test results.  I agree with the assessment, diagnosis, and plan of care documented in the resident's note. 

## 2015-04-22 ENCOUNTER — Encounter (HOSPITAL_COMMUNITY): Payer: Self-pay | Admitting: Psychiatry

## 2015-04-22 ENCOUNTER — Ambulatory Visit (INDEPENDENT_AMBULATORY_CARE_PROVIDER_SITE_OTHER): Payer: 59 | Admitting: Psychiatry

## 2015-04-22 VITALS — BP 132/87 | HR 93 | Ht 72.2 in | Wt 236.0 lb

## 2015-04-22 DIAGNOSIS — F200 Paranoid schizophrenia: Secondary | ICD-10-CM | POA: Diagnosis not present

## 2015-04-22 MED ORDER — FLUPHENAZINE DECANOATE 25 MG/ML IJ SOLN
37.5000 mg | Freq: Once | INTRAMUSCULAR | Status: AC
  Administered 2015-04-22: 37.5 mg via INTRAMUSCULAR

## 2015-04-22 NOTE — Progress Notes (Signed)
Patient ID: Wayne Schmidt, male   DOB: [birth year], 59 y.o.   MRN: [id number] D. Patient presented with flat affect, level mood but stated he thinks his medical problems with recent increased problems with hypertension and glucose problems related to current Prolixin dosage and will discuss with Dr. Adele Schilder at evaluation set for 04/23/15.  Patient reported they had increased his medication for hypertension and this was noted with improved BP this date.  A. Patient's due Prolixin Decanoate 37.5mg /1.87ml IM injection prepared as ordered and given to patient in his left deltoid area.  Patient tolerated injection without complaint of pain or discomfort and reported no auditory or visual hallucinations, no suicidal or homicidal ideations and no problems with sleep or appetite.  R. Patient reported he is doing better with psychiatric status presently even though would like to discuss possible decrease in medications and states feels better since blood pressure is now down.  Patient to return to see Dr. Adele Schilder on 04/23/15 and to call as needed. Patient stable at this time admits to feeling better this date.

## 2015-04-23 ENCOUNTER — Ambulatory Visit (INDEPENDENT_AMBULATORY_CARE_PROVIDER_SITE_OTHER): Payer: 59 | Admitting: Psychiatry

## 2015-04-23 ENCOUNTER — Encounter (HOSPITAL_COMMUNITY): Payer: Self-pay | Admitting: Psychiatry

## 2015-04-23 VITALS — BP 126/82 | HR 100 | Ht 72.5 in | Wt 236.0 lb

## 2015-04-23 DIAGNOSIS — F200 Paranoid schizophrenia: Secondary | ICD-10-CM | POA: Diagnosis not present

## 2015-04-23 MED ORDER — FLUPHENAZINE DECANOATE 25 MG/ML IJ SOLN: 37.5000 mg | INTRAMUSCULAR | Status: DC

## 2015-04-23 NOTE — Progress Notes (Signed)
Wayne Schmidt [id number] Progress Note  Wayne Schmidt [id number] 59 y.o.  04/23/2015 12:22 PM  Chief Complaint: Medication management and followup.           History of Present Illness: Wayne Schmidt came for his followup appointment.  He is getting his injection every 2 weeks.  He denies any irritability, anger, paranoia or any hallucination.  He sleeping good.  He is happy that his blood pressure is also well controlled.  He denies any agitation or any anger.  He is sleeping good.  He has mild tremors in his hand but otherwise he has no major concern.  He denies any feeling of hopelessness or worthlessness.  He denies drinking or using any illegal substances.  His appetite is okay.  His vitals are stable.  Patient lives by himself however his daughter helps him very frequently.  He is getting injection every 2 weeks and he has no concern.  Suicidal Ideation: No Plan Formed: No Patient has means to carry out plan: No  Homicidal Ideation: No Plan Formed: No Patient has means to carry out plan: No  ROS Psychiatric: Agitation: No Hallucination: No Depressed Mood: No Insomnia: No Hypersomnia: No Altered Concentration: No Feels Worthless: No Grandiose Ideas: No Belief In Special Powers: No New/Increased Substance Abuse: No Compulsions: No  Neurologic: Headache: No Seizure: No Paresthesias: No  Medical history:  Patient has a history of diabetes which is diet controlled, high cholesterol, hypertension, prostate hypertrophy and laminectomy.  Outpatient Encounter Prescriptions as of 04/23/2015  Medication Sig  . ciclopirox (LOPROX) 0.77 % SUSP   . doxazosin (CARDURA) 2 MG tablet TAKE 1 TABLET (2 MG TOTAL) BY MOUTH AT BEDTIME.  . fluPHENAZine decanoate (PROLIXIN) 25 MG/ML injection Inject 1.5 mLs (37.5 mg total) into the muscle every 14 (fourteen) days.  Marland Kitchen ibuprofen (ADVIL,MOTRIN) 800 MG tablet Take 1 tablet (800 mg total) by mouth every 8 (eight) hours as needed for moderate  pain.  Marland Kitchen losartan-hydrochlorothiazide (HYZAAR) 50-12.5 MG tablet Take 2 tablets by mouth daily.  . metFORMIN (GLUCOPHAGE) 500 MG tablet Take 1 tablet (500 mg total) by mouth 2 (two) times daily with a meal. (Patient not taking: Reported on 11/05/2014)  . pravastatin (PRAVACHOL) 40 MG tablet TAKE 1 TABLET (40 MG TOTAL) BY MOUTH DAILY.  . [DISCONTINUED] fluPHENAZine decanoate (PROLIXIN) 25 MG/ML injection Inject 1.5 mLs (37.5 mg total) into the muscle every 14 (fourteen) days.   No facility-administered encounter medications on file as of 04/23/2015.    Past Psychiatric History/Hospitalization(s): Anxiety: No Bipolar Disorder: No Depression: Yes Mania: No Psychosis: Yes Schizophrenia: Yes Personality Disorder: No Hospitalization for psychiatric illness: Patient has a long history of psychiatric illness with multiple hospitalization.  He recall at least 16-20 times hospitalized.  He was admitted at [facility] for psychosis hallucination and paranoia.  In the past he had tried Haldol and Thorazine but endorse that he was allergic to these medication.  He is taking Prolixin injection for past 30 years.  He has at least 2 psychiatric admission because of suicidal attempt when he took overdose on his medication. History of Electroconvulsive Shock Therapy: No Prior Suicide Attempts: At least 2 psychiatric admission do to suicidal attempt when he took overdose on his medication.  Physical Exam: Constitutional:  BP 126/82 mmHg  Pulse 100  Ht 6' 0.5" (1.842 m)  Wt 236 lb (107.049 kg)  BMI 31.55 kg/m2  Recent Results (from the past 2160 hour(s))  HM DIABETES EYE EXAM     Status: None  Collection Time: 02/11/15 12:00 AM  Result Value Ref Range   HM Diabetic Eye Exam No Retinopathy No Retinopathy  HM DIABETES EYE EXAM     Status: None   Collection Time: 02/11/15 12:00 AM  Result Value Ref Range   HM Diabetic Eye Exam  No Retinopathy  BMP8+Anion Gap     Status: None   Collection Time:  04/08/15  4:03 PM  Result Value Ref Range   Glucose 94 65 - 99 mg/dL   BUN 11 8 - 27 mg/dL   Creatinine, Ser 1.12 0.76 - 1.27 mg/dL   GFR calc non Af Amer 71 >59 mL/min/1.73   GFR calc Af Amer 82 >59 mL/min/1.73   BUN/Creatinine Ratio 10 10 - 22   Sodium 137 134 - 144 mmol/L    Comment: **Effective April 19, 2015 the reference interval**   for Sodium, Serum will be changing to:                                             136 - 144    Potassium 4.0 3.5 - 5.2 mmol/L    Comment: **Effective April 19, 2015 the reference interval**   for Potassium, Serum will be changing to:                          0 -  7 days        3.7 - 5.2                          8 - 30 days        3.7 - 6.4                          1 -  6 months      3.8 - 6.0                   7 months -  1 year        3.8 - 5.3                              >1 year        3.5 - 5.2    Chloride 98 97 - 108 mmol/L    Comment: **Effective April 19, 2015 the reference interval**   for Chloride, Serum will be changing to:                                              97 - 106    CO2 25 18 - 29 mmol/L   Anion Gap 14.0 10.0 - 18.0 mmol/L   Calcium 9.5 8.6 - 10.2 mg/dL  Glucose, capillary     Status: Abnormal   Collection Time: 04/08/15  4:11 PM  Result Value Ref Range   Glucose-Capillary 109 (H) 65 - 99 mg/dL  POC Hbg A1C     Status: None   Collection Time: 04/08/15  4:19 PM  Result Value Ref Range   Hemoglobin A1C 7.1     General Appearance: alert, oriented, no acute distress and well nourished  Musculoskeletal: Strength & Muscle Tone: within normal limits Gait & Station: normal Patient leans: N/A   Mental status examination Patient is well dressed and groomed.  He is pleasant and cooperative.  He maintained fair eye contact.  His speech is slow and sometimes rambling.  He denies any delusion or any paranoia.  He described his mood neutral and his affect is appropriate.  His thought process is slow and  circumstantial.  His psychomotor activity is slow.  His fund of knowledge is average.  He has mild tremors on his left hand.  His attention and concentration is fair.  He is alert and oriented x3.  His insight judgment and impulse control is okay.  Established Problem, Stable/Improving (1), Review or order clinical lab tests (1), Review of Last Therapy Session (1) and Review of Medication Regimen & Side Effects (2)  Assessment: Axis I: Chronic schizophrenia paranoid type  Axis II: Deferred  Axis III:  Patient Active Problem List   Diagnosis Date Noted  . Health maintenance examination 04/01/2014  . Lumbar arthropathy ([place]) 08/04/2013  . Cervical muscle pain 08/04/2013  . Episodic lightheadedness 04/15/2013  . 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 MICROCYTOSIS 02/24/2010  . BENIGN PROSTATIC HYPERTROPHY, HX OF 03/24/2009  . Paranoid schizophrenia, subchronic condition ([place]) 10/11/2006  . DYSLIPIDEMIA 08/28/2006  . Diabetes mellitus without complication ([place]) 34/35/6861  . TOBACCO ABUSE 04/27/2006  . Essential hypertension 04/27/2006    Plan:  Patient is stable on his medication.  He has no concerned or any side effects other than mild tremors in his hand.  Patient like to continue his Prolixin injection which is helping his paranoia, hallucination and mood. I will continue Prolixin dec 37.5 mg IM every 2 weeks.  Recommended to call us back if he is any question or any concern.  Follow-up in 3 months.   Amberlyn Martinezgarcia T., MD 04/23/2015

## 2015-05-06 ENCOUNTER — Ambulatory Visit (INDEPENDENT_AMBULATORY_CARE_PROVIDER_SITE_OTHER): Payer: 59

## 2015-05-06 VITALS — BP 133/86 | HR 86 | Ht 72.5 in | Wt 235.4 lb

## 2015-05-06 DIAGNOSIS — F200 Paranoid schizophrenia: Secondary | ICD-10-CM

## 2015-05-06 MED ORDER — FLUPHENAZINE DECANOATE 25 MG/ML IJ SOLN
37.5000 mg | INTRAMUSCULAR | Status: DC
  Administered 2015-05-06: 37.5 mg via INTRAMUSCULAR

## 2015-05-06 NOTE — Progress Notes (Signed)
Patient ID: Wayne Schmidt, male   DOB: 05/13/1956, 59 y.o.   MRN: [id number] D. Patient presented with flat affect, level mood and denied any auditory or visual hallucinations, no suicidal or homicidal ideations and no complaints of problems with any other symptoms. Patient reported still smoking a lot so provided education about stopping as patient not currently interested.  Patient's blood pressure better today as PCP made some medication adjustments recently that have helped.  A. Patient's due Prolixin Decanoate 37.5mg /1.5 ml IM injection prepared and given to patient in his right deltoid area as ordered.  R. Patient tolerated injection without complaint of pain or discomfort and agreed to return in 2 weeks for next injection.  Patient stable with no reported concerns today and agreed to call if any problems or return of symptoms.

## 2015-05-10 ENCOUNTER — Other Ambulatory Visit: Payer: Self-pay | Admitting: Internal Medicine

## 2015-05-10 DIAGNOSIS — I10 Essential (primary) hypertension: Secondary | ICD-10-CM

## 2015-05-10 NOTE — Telephone Encounter (Signed)
Pt requesting the nurse to call back regarding his losartan. Please call pt back.

## 2015-05-10 NOTE — Telephone Encounter (Signed)
Spoke with patient.  He has complaints of his Hyzaar causing disruption to his sleep due to frequent urination overnight.  Pt reports taking 1 50mg  tab in the AM and 1 50mg  tab in the PM.   Advised pt that per the instructions he is to take 2 tabs once daily.  If he takes both tabs in the AM this should help eliminate his frequent urination overnight.  Told pt I would let his PCP know he had called.

## 2015-05-11 MED ORDER — LOSARTAN POTASSIUM-HCTZ 50-12.5 MG PO TABS: 2.0000 | ORAL_TABLET | Freq: Every day | ORAL | Status: DC

## 2015-05-18 ENCOUNTER — Other Ambulatory Visit: Payer: Self-pay | Admitting: Internal Medicine

## 2015-05-18 DIAGNOSIS — E1159 Type 2 diabetes mellitus with other circulatory complications: Secondary | ICD-10-CM | POA: Diagnosis not present

## 2015-05-18 DIAGNOSIS — M79674 Pain in right toe(s): Secondary | ICD-10-CM | POA: Diagnosis not present

## 2015-05-18 DIAGNOSIS — M79675 Pain in left toe(s): Secondary | ICD-10-CM | POA: Diagnosis not present

## 2015-05-18 NOTE — Telephone Encounter (Signed)
Pt requesting losartan to be filled.

## 2015-05-18 NOTE — Telephone Encounter (Signed)
Called pharm, gave them script from 11/8 verbally over ph, they did not get electronically

## 2015-05-20 ENCOUNTER — Encounter (HOSPITAL_COMMUNITY): Payer: Self-pay

## 2015-05-20 ENCOUNTER — Ambulatory Visit (INDEPENDENT_AMBULATORY_CARE_PROVIDER_SITE_OTHER): Payer: 59

## 2015-05-20 VITALS — BP 125/84 | HR 89 | Ht 72.5 in | Wt 237.0 lb

## 2015-05-20 DIAGNOSIS — F200 Paranoid schizophrenia: Secondary | ICD-10-CM

## 2015-05-20 MED ORDER — FLUPHENAZINE DECANOATE 25 MG/ML IJ SOLN
37.5000 mg | INTRAMUSCULAR | Status: DC
  Administered 2015-05-20: 37.5 mg via INTRAMUSCULAR

## 2015-05-20 NOTE — Progress Notes (Signed)
Patient ID: Wayne Schmidt, male   DOB: 03-12-1956, 59 y.o.   MRN: [id number] D. Patient presented with appropriate affect, level and pleasant mood and reported feeling better this date as BP has decreased with recent medication changes.  Patient denied any auditory or visual hallucinations, no suicidal or homicidal ideations and no problems with appetite.  Patient did report more disturbed sleep with recent changes in BP medications and states has an appointment coming up on 05/21/15 to discuss with PCP.  A. Patient's due Prolixin Decaonate 37.5mg /1.60ml IM injection given as ordered in patient's left deltoid area.  R. Patient tolerated injection without complaint of pain or discomfort and agreed to return in 2 weeks for next due injection.  Patient to call as needed and denied any psychiatric symptoms this date but does plan to follow up with PCP on 05/21/15 due to problems with medication causing increased trips to the bathroom during the night.

## 2015-05-21 ENCOUNTER — Ambulatory Visit (INDEPENDENT_AMBULATORY_CARE_PROVIDER_SITE_OTHER): Payer: Medicare Other | Admitting: Internal Medicine

## 2015-05-21 ENCOUNTER — Encounter: Payer: Self-pay | Admitting: Internal Medicine

## 2015-05-21 VITALS — BP 105/77 | HR 91 | Temp 97.0°F | Ht 77.0 in | Wt 238.0 lb

## 2015-05-21 DIAGNOSIS — I10 Essential (primary) hypertension: Secondary | ICD-10-CM | POA: Diagnosis not present

## 2015-05-21 MED ORDER — LOSARTAN POTASSIUM-HCTZ 100-12.5 MG PO TABS: 1.0000 | ORAL_TABLET | Freq: Every day | ORAL | Status: DC

## 2015-05-21 NOTE — Assessment & Plan Note (Addendum)
BP Readings from Last 3 Encounters:  05/21/15 105/77  05/20/15 125/84  05/06/15 133/86    Lab Results  Component Value Date   NA 137 04/08/2015   K 4.0 04/08/2015   CREATININE 1.12 04/08/2015   Patient was recently doubled on losartan-HCTZ from 50/12.5 daily to 100/25 daily. He was also taking this medication split qam and qhs, with worsened nocturia as a consequence. Additionally he is now symptomatic of hypotension with episodic dizziness for several weeks sicne starting his increased dose of antihypertensives. He denies any dyspnea, headache, or chest pain.  Assessment: Blood pressure control: Below goal Progress toward BP goal:  Overshot Comments: Patient is now being overtreated for his hypertension. He has a good response to current medications so dose titration should be able to reach an appropriate response.  Plan: Medications:  Change to Losartan/HCTZ 100mg /12.5mg  once daily in the morning to reduce total dose of diuretic and reduce nocturia. Continue doxazosin 2mg . Educational resources provided:   Self management tools provided:   Other plans: Patient instructed on changing his BID medication to taking only in the morning. Will also call pharmacy to cancel existing prescription as patient is not very savvy in understanding of his medical management. Will need to check a metabolic panel after he reaches a euvolemic status on a reduced dose of diuretic.

## 2015-05-21 NOTE — Patient Instructions (Signed)
Stop taking Your Losartan-HCTZ 50mg -12.5mg  twice daily.  You can stay off all of this medication for 1-2 days while your dizziness improves.  Start taking your new dose of Losartan-HCTZ 100mg -12.5mg  ONCE daily in the mornings.  This new dose should cause less urination at night and less dizziness during the day.

## 2015-05-21 NOTE — Progress Notes (Signed)
   Subjective:   Patient ID: Wayne Schmidt male   DOB: 31-May-1956 59 y.o.   MRN: [id number]  HPI: Wayne Schmidt is a 59 y.o. man with PMHx of hypertension, schizophrenia with paranoid component, chronic cigarette use, BPH presents for follow up of his hypertension following recent change to medications.    See problem based assessment and plan below for details.   Past Medical History  Diagnosis Date  . Hypertension   . Schizophrenia ([place])   . Microcytosis   . Substance abuse     tobacco use  . High risk sexual behavior 10/2007   Current Outpatient Prescriptions  Medication Sig Dispense Refill  . ciclopirox (LOPROX) 0.77 % SUSP     . doxazosin (CARDURA) 2 MG tablet TAKE 1 TABLET (2 MG TOTAL) BY MOUTH AT BEDTIME. 90 tablet 2  . fluPHENAZine decanoate (PROLIXIN) 25 MG/ML injection Inject 1.5 mLs (37.5 mg total) into the muscle every 14 (fourteen) days. 5 mL 2  . ibuprofen (ADVIL,MOTRIN) 800 MG tablet Take 1 tablet (800 mg total) by mouth every 8 (eight) hours as needed for moderate pain. 90 tablet 0  . losartan-hydrochlorothiazide (HYZAAR) 100-12.5 MG tablet Take 1 tablet by mouth daily. 60 tablet 5  . metFORMIN (GLUCOPHAGE) 500 MG tablet Take 1 tablet (500 mg total) by mouth 2 (two) times daily with a meal. (Patient not taking: Reported on 11/05/2014) 60 tablet 11  . pravastatin (PRAVACHOL) 40 MG tablet TAKE 1 TABLET (40 MG TOTAL) BY MOUTH DAILY. 90 tablet 3   No current facility-administered medications for this visit.   Family History  Problem Relation Age of Onset  . Diabetes Mother   . Hypertension Mother   . Lupus Sister   . Sickle cell anemia Brother   . Drug abuse Brother    Social History   Social History  . Marital Status: Legally Separated    Spouse Name: N/A  . Number of Children: N/A  . Years of Education: N/A   Social History Main Topics  . Smoking status: Current Every Day Smoker -- 1.00 packs/day for 38 years    Types: Cigarettes  . Smokeless  tobacco: Not on file     Comment: cutting back.  Has tried  . Alcohol Use: No     Comment: 40 oz beer/week  . Drug Use: No     Comment: h/o remote cocaine use-denies present use  . Sexual Activity: Not Currently   Other Topics Concern  . Not on file   Social History Narrative   Patient given diabetes card 07/07/2010   Review of Systems: Review of Systems  Constitutional: Negative for fever and chills.  Cardiovascular: Negative for chest pain, palpitations and leg swelling.  Genitourinary: Positive for frequency.  Musculoskeletal: Negative for falls.  Neurological: Positive for dizziness. Negative for headaches.    Objective:  Physical Exam: Filed Vitals:   05/21/15 1442  BP: 105/77  Pulse: 91  Temp: 97 F (36.1 C)  TempSrc: Oral  Height: 6\' 5"  (1.956 m)  Weight: 238 lb (107.956 kg)  SpO2: 99%   GENERAL- alert, co-operative, NAD HEENT- Atraumatic, PERRL, EOMI, oral mucosa appears dry, poor dentition with  CARDIAC- RRR, no murmurs, rubs or gallops. RESP- CTAB, no wheezes or crackles. ABDOMEN- Soft, nontender, no guarding or rebound EXTREMITIES- symmetric, no pedal edema. SKIN- Warm, dry, No rash or lesion. PSYCH- Normal mood and affect, appropriate thought content and speech.   Assessment & Plan:

## 2015-05-25 NOTE — Progress Notes (Signed)
Internal Medicine Clinic Attending  I saw and evaluated the patient.  I personally confirmed the key portions of the history and exam documented by Dr. Rice and I reviewed pertinent patient test results.  The assessment, diagnosis, and plan were formulated together and I agree with the documentation in the resident's note.  

## 2015-06-03 ENCOUNTER — Ambulatory Visit (INDEPENDENT_AMBULATORY_CARE_PROVIDER_SITE_OTHER): Payer: 59

## 2015-06-03 ENCOUNTER — Encounter (HOSPITAL_COMMUNITY): Payer: Self-pay

## 2015-06-03 VITALS — BP 133/88 | HR 99 | Ht 72.5 in | Wt 235.8 lb

## 2015-06-03 DIAGNOSIS — F200 Paranoid schizophrenia: Secondary | ICD-10-CM

## 2015-06-03 MED ORDER — FLUPHENAZINE DECANOATE 25 MG/ML IJ SOLN
37.5000 mg | Freq: Once | INTRAMUSCULAR | Status: AC
  Administered 2015-06-03: 37.5 mg via INTRAMUSCULAR

## 2015-06-03 NOTE — Progress Notes (Signed)
Patient ID: Wayne Schmidt, male   DOB: 06/19/1956, 59 y.o.   MRN: [id number] D. Patient presented with flat affect, level mood and denied any auditory or visual hallucinations, no thoughts of wanting to harm self or others and reported sleep and appetite fine.  Patient reported still working with PCP on managing hypertension more closely and continues to be improved this date.  A. Patient's due Prolixin Decanoate 37.5mg /1.57ml IM injection prepared as ordered and given to patient in his right deltoid area.  R. Patient tolerated injection without complaint of pain or discomfort and agreed to return in 2 weeks for his next due injection.  Patient denied any symptoms or concerns today and will call prior to next appointment if any problems.

## 2015-06-17 ENCOUNTER — Ambulatory Visit (INDEPENDENT_AMBULATORY_CARE_PROVIDER_SITE_OTHER): Payer: 59

## 2015-06-17 VITALS — BP 132/83 | HR 89 | Ht 72.5 in | Wt 234.6 lb

## 2015-06-17 DIAGNOSIS — F200 Paranoid schizophrenia: Secondary | ICD-10-CM

## 2015-06-17 MED ORDER — FLUPHENAZINE DECANOATE 25 MG/ML IJ SOLN
37.5000 mg | Freq: Once | INTRAMUSCULAR | Status: AC
  Administered 2015-06-17: 37.5 mg via INTRAMUSCULAR

## 2015-06-17 NOTE — Progress Notes (Signed)
Patient ID: Wayne Schmidt, male   DOB: Oct 26, 1955, 59 y.o.   MRN: [id number] D. Patient presented with flat affect, level mood and denied any suicidal or homicidal ideations.  Reported positive auditory hallucination at times but reports "They don't bother me".  Denied an visual hallucinations, no problems with sleep or appetite and reported feeling better due to BP now more in range.  A. Patient's due Prolixin Decanoate 37.5mg /1.74ml IM injection prepared as ordered and given to patient in his left deltoid area.  R. Patient tolerated injection without compliant of pain or discomfort and agreed to return in 2 weeks for next due injection.  Patient reported plan to spend some time with daughter and grandchildren over the holidays and will call if any problems or concerns.

## 2015-06-18 ENCOUNTER — Encounter: Payer: Self-pay | Admitting: Internal Medicine

## 2015-06-18 ENCOUNTER — Ambulatory Visit (INDEPENDENT_AMBULATORY_CARE_PROVIDER_SITE_OTHER): Payer: Medicare Other | Admitting: Internal Medicine

## 2015-06-18 VITALS — BP 130/80 | HR 80 | Temp 97.7°F | Ht 77.0 in | Wt 238.5 lb

## 2015-06-18 DIAGNOSIS — B351 Tinea unguium: Secondary | ICD-10-CM | POA: Diagnosis not present

## 2015-06-18 DIAGNOSIS — R42 Dizziness and giddiness: Secondary | ICD-10-CM

## 2015-06-18 DIAGNOSIS — I10 Essential (primary) hypertension: Secondary | ICD-10-CM

## 2015-06-18 DIAGNOSIS — F200 Paranoid schizophrenia: Secondary | ICD-10-CM

## 2015-06-18 DIAGNOSIS — E119 Type 2 diabetes mellitus without complications: Secondary | ICD-10-CM

## 2015-06-18 LAB — GLUCOSE, CAPILLARY: Glucose-Capillary: 229 mg/dL — ABNORMAL HIGH (ref 65–99)

## 2015-06-18 NOTE — Assessment & Plan Note (Signed)
No significant change. Patient is followed with podiatry at a six-month interval. He recently had a small skin avulsion between the fourth and fifth digits of the left foot. On exam there is a approximately 4 cm diameter scab on the medial aspect of the fifth toe without any drainage or surrounding erythema. He has intact sensation in his feet and this is nonpainful. No specific interventions indicated as this is already a closed wound without any signs of progression or worsening. Instructions given to patient to contact us again if this becomes more painful or seems to get worse instead of better.

## 2015-06-18 NOTE — Progress Notes (Signed)
Internal Medicine Clinic Attending  I saw and evaluated the patient.  I personally confirmed the key portions of the history and exam documented by Dr. Rice and I reviewed pertinent patient test results.  The assessment, diagnosis, and plan were formulated together and I agree with the documentation in the resident's note.  

## 2015-06-18 NOTE — Progress Notes (Signed)
Subjective:   Patient ID: Wayne Schmidt male   DOB: 27-Aug-1955 59 y.o.   MRN: [id number]  HPI: Wayne Schmidt is a 59 y.o. man with past medical history as described below who is being seen in follow-up for his hypertension. One month ago he was having symptomatic hypotension and probable dehydration after doubling of his losartan HCTZ dose to twice daily. This p.m. dose was also worsening his nocturia that is chronic related to his BPH and doxazosin.  See problem based assessment and plan below for additional details.  Past Medical History  Diagnosis Date  . Hypertension   . Schizophrenia ([place])   . Microcytosis   . Substance abuse     tobacco use  . High risk sexual behavior 10/2007   Current Outpatient Prescriptions  Medication Sig Dispense Refill  . ciclopirox (LOPROX) 0.77 % SUSP     . doxazosin (CARDURA) 2 MG tablet TAKE 1 TABLET (2 MG TOTAL) BY MOUTH AT BEDTIME. 90 tablet 2  . fluPHENAZine decanoate (PROLIXIN) 25 MG/ML injection Inject 1.5 mLs (37.5 mg total) into the muscle every 14 (fourteen) days. 5 mL 2  . ibuprofen (ADVIL,MOTRIN) 800 MG tablet Take 1 tablet (800 mg total) by mouth every 8 (eight) hours as needed for moderate pain. 90 tablet 0  . losartan-hydrochlorothiazide (HYZAAR) 100-12.5 MG tablet Take 1 tablet by mouth daily. 60 tablet 5  . metFORMIN (GLUCOPHAGE) 500 MG tablet Take 1 tablet (500 mg total) by mouth 2 (two) times daily with a meal. (Patient not taking: Reported on 11/05/2014) 60 tablet 11  . pravastatin (PRAVACHOL) 40 MG tablet TAKE 1 TABLET (40 MG TOTAL) BY MOUTH DAILY. 90 tablet 3   No current facility-administered medications for this visit.   Family History  Problem Relation Age of Onset  . Diabetes Mother   . Hypertension Mother   . Lupus Sister   . Sickle cell anemia Brother   . Drug abuse Brother    Social History   Social History  . Marital Status: Legally Separated    Spouse Name: N/A  . Number of Children: N/A  . Years of  Education: N/A   Social History Main Topics  . Smoking status: Current Every Day Smoker -- 1.00 packs/day for 38 years    Types: Cigarettes  . Smokeless tobacco: Not on file     Comment: cutting back.  Has tried  . Alcohol Use: No     Comment: 40 oz beer/week  . Drug Use: No     Comment: h/o remote cocaine use-denies present use  . Sexual Activity: Not Currently   Other Topics Concern  . Not on file   Social History Narrative   Patient given diabetes card 07/07/2010   Review of Systems: Review of Systems  Constitutional: Negative for fever and chills.  Eyes: Negative for blurred vision and double vision.  Respiratory: Negative for shortness of breath.   Cardiovascular: Negative for chest pain and leg swelling.  Genitourinary: Positive for frequency. Negative for dysuria and hematuria.  Musculoskeletal: Negative for falls.  Neurological: Positive for headaches. Negative for dizziness and weakness.    Objective:  Physical Exam: Filed Vitals:   06/18/15 0944  BP: 141/80  Pulse: 95  Temp: 97.7 F (36.5 C)  TempSrc: Oral  Height: 6\' 5"  (1.956 m)  Weight: 238 lb 8 oz (108.183 kg)  SpO2: 99%   GENERAL- alert, co-operative, NAD HEENT- Atraumatic, PERRL, EOMI, oral mucosa appears dry, poor dentition with numerous missing teeth CARDIAC- RRR, no  murmurs, rubs or gallops. RESP- CTAB, no wheezes or crackles. ABDOMEN- Soft, nontender, no guarding or rebound EXTREMITIES- symmetric, no pedal edema, chronic skin dryness in lower extremities, small scab between 4th and 5th toes of left foot with no surrounding erythema SKIN- Warm, dry, No rash or lesion. PSYCH- Flat affect, slow responses, pill rolling tremor in right hand, appropriate speech.  Assessment & Plan:

## 2015-06-18 NOTE — Assessment & Plan Note (Signed)
BP Readings from Last 3 Encounters:  06/18/15 130/80  06/17/15 132/83  06/03/15 133/88    Lab Results  Component Value Date   NA 137 04/08/2015   K 4.0 04/08/2015   CREATININE 1.12 04/08/2015    Assessment: Blood pressure control: Controlled Progress toward BP goal:  At goal Comments: The patient's medicines adjusted last visit due to hypotension and worsening of his nocturia with doubling losartan HCTZ to twice daily. Based on this we changed him to 100/12.5 mg losartan/HCTZ once daily in the morning. Since then it has decreased the frequency of dizziness and nocturia. His blood pressure remains well-controlled without episodes of hypotension at this dose.  Plan: Medications:  continue current medications Other plans: Check bmet today with change in his ARB and diuretic dosing. If he is tolerating this well and does not have worsening symptoms we can plan to follow him up at a three-month interval since noted changes are made today.

## 2015-06-18 NOTE — Patient Instructions (Signed)
Your blood pressure is controlled today, 130/80 and it sounds like some improvement in your dehydration and your urinating at night. We will also check your blood work to make sure your kidneys and body chemistry are doing well. We will call you if there are any unusual results that require a change in plans.  If you need please call at [phone]. Otherwise we will see you in approximately 3 months for follow-up of your blood pressure and diabetes and health maintenance.

## 2015-06-19 LAB — BMP8+ANION GAP
ANION GAP: 15 mmol/L (ref 10.0–18.0)
BUN/Creatinine Ratio: 10 (ref 10–22)
BUN: 12 mg/dL (ref 8–27)
CALCIUM: 9.4 mg/dL (ref 8.6–10.2)
CO2: 26 mmol/L (ref 18–29)
CREATININE: 1.24 mg/dL (ref 0.76–1.27)
Chloride: 96 mmol/L (ref 96–106)
GFR calc Af Amer: 73 mL/min/{1.73_m2} (ref >59)
GFR calc non Af Amer: 63 mL/min/{1.73_m2} (ref >59)
Glucose: 172 mg/dL — ABNORMAL HIGH (ref 65–99)
Potassium: 4.1 mmol/L (ref 3.5–5.2)
Sodium: 137 mmol/L (ref 134–144)

## 2015-07-01 ENCOUNTER — Ambulatory Visit (INDEPENDENT_AMBULATORY_CARE_PROVIDER_SITE_OTHER): Payer: 59

## 2015-07-01 VITALS — BP 148/80 | HR 89 | Ht 72.5 in | Wt 238.2 lb

## 2015-07-01 DIAGNOSIS — F200 Paranoid schizophrenia: Secondary | ICD-10-CM

## 2015-07-01 MED ORDER — FLUPHENAZINE DECANOATE 25 MG/ML IJ SOLN
37.5000 mg | Freq: Once | INTRAMUSCULAR | Status: AC
  Administered 2015-07-01: 37.5 mg via INTRAMUSCULAR

## 2015-07-01 NOTE — Progress Notes (Signed)
Patient ID: Wayne Schmidt, male   DOB: [birth year], 59 y.o.   MRN: [id number] D. Patient presented with flat affect, level mood but reported feeling a little tired today as stated he did not sleep well the previous night.  Patient denied any auditory or visual hallucinations, no suicidal or homicidal ideations and reports no problems with appetite.  Patient stated he had taken his hypertension medication before he came but is still up today at 148/80.  Reports still working with PCP to manage and denied any other concerns or problems at this time.  States he never seems to sleep well before coming in for injection but denies any ongoing sleep problems.  A. Patient's due Prolixin Decanoate 37.5 mg/1.5 ml IM injection prepared as ordered and given to patient in his right deltoid.  R. Patient tolerated injection without complaint of pain or discomfort and agreed to call if any problems prior to next injection due in 2 weeks.  Patient stable at this time and denies any current pressing concerns or symptoms.   Patient to call as needed.

## 2015-07-07 ENCOUNTER — Other Ambulatory Visit (HOSPITAL_COMMUNITY): Payer: Self-pay | Admitting: Psychiatry

## 2015-07-15 ENCOUNTER — Encounter (HOSPITAL_COMMUNITY): Payer: Self-pay

## 2015-07-15 ENCOUNTER — Ambulatory Visit (INDEPENDENT_AMBULATORY_CARE_PROVIDER_SITE_OTHER): Payer: 59

## 2015-07-15 VITALS — BP 138/82 | HR 106 | Ht 72.5 in | Wt 234.8 lb

## 2015-07-15 DIAGNOSIS — F200 Paranoid schizophrenia: Secondary | ICD-10-CM | POA: Diagnosis not present

## 2015-07-15 MED ORDER — FLUPHENAZINE DECANOATE 25 MG/ML IJ SOLN
37.5000 mg | Freq: Once | INTRAMUSCULAR | Status: AC
  Administered 2015-07-15: 37.5 mg via INTRAMUSCULAR

## 2015-07-15 NOTE — Progress Notes (Signed)
Patient ID: Wayne Schmidt, male   DOB: 06-08-1956, 60 y.o.   MRN: [id number] D. Patient presented with flat affect, level mood and denied any auditory or visual hallucinations, no suicidal or homicidal ideations and no problems with other symptoms.  A. Patient's due Prolixin Deconoate 37.5 mg/1.5 ml IM injection prepared and given to patient in his left deltoid area.  R. Patient tolerated injection without complaint of pain or discomfort and agreed to return in 2 weeks for next injection. Patient stable with no complaints or concerns at this time and to call if any problems before next appointment.

## 2015-07-27 ENCOUNTER — Ambulatory Visit (INDEPENDENT_AMBULATORY_CARE_PROVIDER_SITE_OTHER): Payer: 59

## 2015-07-27 ENCOUNTER — Encounter (HOSPITAL_COMMUNITY): Payer: Self-pay | Admitting: Psychiatry

## 2015-07-27 ENCOUNTER — Ambulatory Visit (INDEPENDENT_AMBULATORY_CARE_PROVIDER_SITE_OTHER): Payer: 59 | Admitting: Psychiatry

## 2015-07-27 VITALS — BP 138/89 | HR 96 | Ht 72.5 in | Wt 237.6 lb

## 2015-07-27 DIAGNOSIS — F200 Paranoid schizophrenia: Secondary | ICD-10-CM | POA: Diagnosis not present

## 2015-07-27 MED ORDER — FLUPHENAZINE DECANOATE 25 MG/ML IJ SOLN: 37.5000 mg | INTRAMUSCULAR | Status: DC

## 2015-07-27 MED ORDER — FLUPHENAZINE DECANOATE 25 MG/ML IJ SOLN
37.5000 mg | INTRAMUSCULAR | Status: DC
  Administered 2015-07-27: 37.5 mg via INTRAMUSCULAR

## 2015-07-27 NOTE — Progress Notes (Signed)
Patient ID: Wayne Schmidt, male   DOB: [birth year], 60 y.o.   MRN: [id number]   Patient presented with appropriate affect, level mood and denied any current auditory or visual hallucinations, and no problems with sleep, appetite or mood.  Patient saw Dr. Adele Schilder this date and agreed with plan to obtain due Prolixin Decanoate 37.5 mg/1.5 ml IM injection this date while at the facility so he does not want to have to return this week. A new vial opened this date that patient brought in and medication prepared as ordered: Prolixin Dec. 37.5 mg/1.5 ml IM injection.  Patient given injection and tolerated without complaint of pain or discomfort.  Patient agreed to return in 2 weeks for next due injection and to call as needed.

## 2015-07-27 NOTE — Progress Notes (Signed)
Wayne Schmidt [phone] Progress Note  Wayne Schmidt [id number] 59 y.o.  07/27/2015 11:19 AM  Chief Complaint: Medication management and followup.           History of Present Illness: Wayne Schmidt came for his followup appointment.  He had a good Christmas.  He went to [place] because of his family member.  He sleeping good.  He continues to get Prolixin injection every 2 weeks.  He denies any hallucination, paranoia, irritability, anger.  He likes his medication.  He has mild tremors in his hand but it does not bother him. In the past he had tried a higher dose however he started to have shakes and tremors.  He is not drinking or using any illegal substances.  His appetite is okay.  His energy level is good.  He lives by himself.  However his daughter frequently visits him.  Patient was to continue Prolixin injection.  Recently he seen his primary care physician and he had blood work.  His glucose was 172.  His BUN/creatinine and sodium is normal.   Suicidal Ideation: No Plan Formed: No Patient has means to carry out plan: No  Homicidal Ideation: No Plan Formed: No Patient has means to carry out plan: No  ROS Psychiatric: Agitation: No Hallucination: No Depressed Mood: No Insomnia: No Hypersomnia: No Altered Concentration: No Feels Worthless: No Grandiose Ideas: No Belief In Special Powers: No New/Increased Substance Abuse: No Compulsions: No  Neurologic: Headache: No Seizure: No Paresthesias: No  Medical history:  Patient has a history of diabetes which is diet controlled, high cholesterol, hypertension, prostate hypertrophy and laminectomy.  Outpatient Encounter Prescriptions as of 07/27/2015  Medication Sig  . ciclopirox (LOPROX) 0.77 % SUSP   . doxazosin (CARDURA) 2 MG tablet TAKE 1 TABLET (2 MG TOTAL) BY MOUTH AT BEDTIME.  . fluPHENAZine decanoate (PROLIXIN) 25 MG/ML injection Inject 1.5 mLs (37.5 mg total) into the muscle every 14 (fourteen) days.  Marland Kitchen  ibuprofen (ADVIL,MOTRIN) 800 MG tablet Take 1 tablet (800 mg total) by mouth every 8 (eight) hours as needed for moderate pain.  Marland Kitchen losartan-hydrochlorothiazide (HYZAAR) 100-12.5 MG tablet Take 1 tablet by mouth daily.  . metFORMIN (GLUCOPHAGE) 500 MG tablet Take 1 tablet (500 mg total) by mouth 2 (two) times daily with a meal. (Patient not taking: Reported on 11/05/2014)  . pravastatin (PRAVACHOL) 40 MG tablet TAKE 1 TABLET (40 MG TOTAL) BY MOUTH DAILY.  . [DISCONTINUED] fluPHENAZine decanoate (PROLIXIN) 25 MG/ML injection Inject 1.5 mLs (37.5 mg total) into the muscle every 14 (fourteen) days.   No facility-administered encounter medications on file as of 07/27/2015.    Past Psychiatric History/Hospitalization(s): Anxiety: No Bipolar Disorder: No Depression: Yes Mania: No Psychosis: Yes Schizophrenia: Yes Personality Disorder: No Hospitalization for psychiatric illness: Patient has a long history of psychiatric illness with multiple hospitalization.  He recall at least 16-20 times hospitalized.  He was admitted at [facility] for psychosis hallucination and paranoia.  In the past he had tried Haldol and Thorazine but endorse that he was allergic to these medication.  He is taking Prolixin injection for past 30 years.  He has at least 2 psychiatric admission because of suicidal attempt when he took overdose on his medication. History of Electroconvulsive Shock Therapy: No Prior Suicide Attempts: At least 2 psychiatric admission do to suicidal attempt when he took overdose on his medication.  Physical Exam: Constitutional:  BP 138/89 mmHg  Pulse 96  Ht 6' 0.5" (1.842 m)  Wt 237 lb 9.6  oz (107.775 kg)  BMI 31.76 kg/m2  Recent Results (from the past 2160 hour(s))  Glucose, capillary     Status: Abnormal   Collection Time: 06/18/15  9:56 AM  Result Value Ref Range   Glucose-Capillary 229 (H) 65 - 99 mg/dL  BMP8+Anion Gap     Status: Abnormal   Collection Time: 06/18/15 10:55 AM  Result  Value Ref Range   Glucose 172 (H) 65 - 99 mg/dL   BUN 12 8 - 27 mg/dL   Creatinine, Ser 1.24 0.76 - 1.27 mg/dL   GFR calc non Af Amer 63 >59 mL/min/1.73   GFR calc Af Amer 73 >59 mL/min/1.73   BUN/Creatinine Ratio 10 10 - 22   Sodium 137 134 - 144 mmol/L    Comment:               **Please note reference interval change**   Potassium 4.1 3.5 - 5.2 mmol/L   Chloride 96 96 - 106 mmol/L    Comment:               **Please note reference interval change**   CO2 26 18 - 29 mmol/L   Anion Gap 15.0 10.0 - 18.0 mmol/L   Calcium 9.4 8.6 - 10.2 mg/dL    General Appearance: alert, oriented, no acute distress and well nourished  Musculoskeletal: Strength & Muscle Tone: within normal limits Gait & Station: normal Patient leans: N/A   Mental status examination Patient is casually dressed and groomed.  He is pleasant and cooperative.  He maintained fair eye contact.  His speech is slow and sometimes rambling.  He denies any delusion or any paranoia.  He described his mood neutral and his affect is appropriate.  His thought process is slow and circumstantial.  His psychomotor activity is slow.  His fund of knowledge is average.  He has mild tremors on his left hand.  His attention and concentration is fair.  He is alert and oriented x3.  His insight judgment and impulse control is okay.  Established Problem, Stable/Improving (1), Review or order clinical lab tests (1), Review of Last Therapy Session (1) and Review of Medication Regimen & Side Effects (2)  Assessment: Axis I: Chronic schizophrenia paranoid type  Axis II: Deferred  Axis III:  Patient Active Problem List   Diagnosis Date Noted  . Health maintenance examination 04/01/2014  . Lumbar arthropathy ([place]) 08/04/2013  . Cervical muscle pain 08/04/2013  . Episodic lightheadedness 04/15/2013  . 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 MICROCYTOSIS 02/24/2010  . BENIGN PROSTATIC HYPERTROPHY, HX OF 03/24/2009  . Paranoid schizophrenia,  subchronic condition ([place]) 10/11/2006  . DYSLIPIDEMIA 08/28/2006  . Diabetes mellitus without complication ([place]) 123456  . TOBACCO ABUSE 04/27/2006  . Essential hypertension 04/27/2006    Plan:  I reviewed blood work results was done in December.  Patient is stable on his medication.  He has no concerned or any side effects other than mild tremors in his hand.  Patient like to continue his Prolixin injection which is helping his paranoia, hallucination and mood. I will continue Prolixin dec 37.5 mg IM every 2 weeks.  Recommended to call us back if he is any question or any concern.  Follow-up in 3 months.   Leiann Sporer T., MD 07/27/2015

## 2015-08-12 ENCOUNTER — Ambulatory Visit (INDEPENDENT_AMBULATORY_CARE_PROVIDER_SITE_OTHER): Payer: 59

## 2015-08-12 VITALS — BP 136/82 | HR 94 | Ht 72.5 in | Wt 232.2 lb

## 2015-08-12 DIAGNOSIS — F200 Paranoid schizophrenia: Secondary | ICD-10-CM | POA: Diagnosis not present

## 2015-08-12 MED ORDER — FLUPHENAZINE DECANOATE 25 MG/ML IJ SOLN
37.5000 mg | Freq: Once | INTRAMUSCULAR | Status: AC
  Administered 2015-08-12: 37.5 mg via INTRAMUSCULAR

## 2015-08-12 NOTE — Progress Notes (Signed)
Patient ID: Wayne Schmidt, male   DOB: 09/09/1955, 60 y.o.   MRN: [id number] Patient presented with flat affect, level and pleasant mood and denied any current problems or symptoms as is in for due Prolixin Decanoate injection this date.  Patient denied any auditory or visual hallucinations, no suicidal or homicidal ideations and no problems with appetite or sleep.  Patient with improved blood pressure this date and prepared due Prolixin Decanoate 37.5 mg/1.5 ml IM injection as ordered.  Injection given in patient's left deltoid area and he tolerated without complaint of pain or discomfort.  Patient agreed to return in 2 weeks for next due injection and to call if any problems prior to then.

## 2015-08-26 ENCOUNTER — Encounter (HOSPITAL_COMMUNITY): Payer: Self-pay

## 2015-08-26 ENCOUNTER — Ambulatory Visit (INDEPENDENT_AMBULATORY_CARE_PROVIDER_SITE_OTHER): Payer: 59

## 2015-08-26 VITALS — BP 145/82 | HR 98 | Ht 72.5 in | Wt 237.6 lb

## 2015-08-26 DIAGNOSIS — F200 Paranoid schizophrenia: Secondary | ICD-10-CM

## 2015-08-26 MED ORDER — FLUPHENAZINE DECANOATE 25 MG/ML IJ SOLN
37.5000 mg | INTRAMUSCULAR | Status: DC
  Administered 2015-08-26: 37.5 mg via INTRAMUSCULAR

## 2015-08-26 NOTE — Progress Notes (Signed)
Patient ID: Wayne Schmidt, male   DOB: Nov 24, 1955, 60 y.o.   MRN: [id number] Patient came in today for his injection of Prolixin Decanoate 37.5mg /1.57mL IM supplied by him. Patient states he is doing well, no hallucinations, no homicidal or suicidal ideations. Patient says he is sleeping well and his appetite is good. Injection was prepared as ordered and injected into patients right deltoid, patient tolerated well without complaint. I advised patient that he would need more medication for his next visit, he voiced his understanding and agreed to come back in 14 days.

## 2015-09-05 ENCOUNTER — Other Ambulatory Visit (HOSPITAL_COMMUNITY): Payer: Self-pay | Admitting: Psychiatry

## 2015-09-06 ENCOUNTER — Other Ambulatory Visit: Payer: Self-pay | Admitting: Internal Medicine

## 2015-09-06 MED ORDER — DOXAZOSIN MESYLATE 2 MG PO TABS: ORAL_TABLET | ORAL | Status: DC

## 2015-09-06 NOTE — Telephone Encounter (Signed)
Needs refill for doxazosin (CARDURA) 2 MG tablet

## 2015-09-09 ENCOUNTER — Encounter (HOSPITAL_COMMUNITY): Payer: Self-pay

## 2015-09-09 ENCOUNTER — Ambulatory Visit (INDEPENDENT_AMBULATORY_CARE_PROVIDER_SITE_OTHER): Payer: 59

## 2015-09-09 VITALS — BP 142/90 | HR 88 | Ht 72.5 in | Wt 235.2 lb

## 2015-09-09 DIAGNOSIS — F200 Paranoid schizophrenia: Secondary | ICD-10-CM

## 2015-09-09 MED ORDER — FLUPHENAZINE DECANOATE 25 MG/ML IJ SOLN
125.0000 mg | INTRAMUSCULAR | Status: DC
  Administered 2015-09-09: 125 mg via INTRAMUSCULAR

## 2015-09-09 NOTE — Progress Notes (Addendum)
Patient ID: Wayne Schmidt, male   DOB: [birth year], 60 y.o.   MRN: [id number] Patient presented today for his IM injection of Fluphenazine Decanoate 125mg /76mL. Patient had a new vial of medication with him. Patient denies any auditory or visual hallucinations and states that he is eating and sleeping well. Patient expressed concern over a coughing episode he had this morning that left him very dizzy. I advised patient to see his primary care because I also noted his eyes were watery and it may be allergies. The injection of Fluphenazine Decanoate 125 mg/ 5 mL was prepared as ordered and given IM in patients left deltoid. Patient tolerated the procedure well and agreed to come back in 2 weeks.

## 2015-09-23 ENCOUNTER — Ambulatory Visit (INDEPENDENT_AMBULATORY_CARE_PROVIDER_SITE_OTHER): Payer: 59

## 2015-09-23 ENCOUNTER — Encounter (HOSPITAL_COMMUNITY): Payer: Self-pay

## 2015-09-23 VITALS — BP 153/87 | HR 92 | Ht 72.5 in | Wt 233.4 lb

## 2015-09-23 DIAGNOSIS — F200 Paranoid schizophrenia: Secondary | ICD-10-CM

## 2015-09-23 MED ORDER — FLUPHENAZINE DECANOATE 25 MG/ML IJ SOLN
37.5000 mg | INTRAMUSCULAR | Status: DC
  Administered 2015-09-23: 37.5 mg via INTRAMUSCULAR

## 2015-09-23 NOTE — Progress Notes (Signed)
Patient ID: Wayne Schmidt, male   DOB: Oct 13, 1955, 60 y.o.   MRN: [id number] Patient presents for his injection of Fluphenazine Decanoate 37.5 mg/ 1.5 mL IM today. Patient presents with appropriate mood and affect. He denies any suicidal or homicidal ideations. Patient is not experiencing any auditory or visual hallucinations. Patient states he is eating and sleeping well. I prepared the injection as ordered and injected 37.5 mg/ 1.5 mL IM into the right deltoid. Patient tolerated the procedure well and without complaint. Patient agreed to come back in 2 weeks.

## 2015-10-07 ENCOUNTER — Ambulatory Visit (INDEPENDENT_AMBULATORY_CARE_PROVIDER_SITE_OTHER): Payer: 59

## 2015-10-07 ENCOUNTER — Encounter (HOSPITAL_COMMUNITY): Payer: Self-pay

## 2015-10-07 VITALS — BP 130/80 | HR 86 | Ht 72.5 in | Wt 238.0 lb

## 2015-10-07 DIAGNOSIS — F200 Paranoid schizophrenia: Secondary | ICD-10-CM | POA: Diagnosis not present

## 2015-10-07 MED ORDER — FLUPHENAZINE DECANOATE 25 MG/ML IJ SOLN
37.5000 mg | INTRAMUSCULAR | Status: DC
  Administered 2015-10-07: 37.5 mg via INTRAMUSCULAR

## 2015-10-07 NOTE — Progress Notes (Signed)
Patient ID: Wayne Schmidt, male   DOB: 06-May-1956, 60 y.o.   MRN: [id number] Patient presents today with appropriate affect, patient reports good appetite but having some problems staying asleep at night. He states he is currently taking tylenol pm but it is no longer working. I advised patient to talk with Dr. Adele Schilder about this at his follow up appointment on 4/25. Patient reports he is not having any visual or audio hallucinations and has no homicidal or suicidal thoughts. Patients injection of Prolixin 37.5mg  was prepared as ordered and 1.5 mL was injected IM in patients left deltoid. Patient tolerated the procedure well and agreed to come back in 2 weeks. Patient told to call with any questions or concerns and we will see him in 2 weeks.

## 2015-10-12 ENCOUNTER — Telehealth: Payer: Self-pay | Admitting: Internal Medicine

## 2015-10-12 NOTE — Telephone Encounter (Signed)
APPT. REMINDER CALL, NO ANSWER, NO VOICE MAIL °

## 2015-10-13 ENCOUNTER — Encounter: Payer: Self-pay | Admitting: Internal Medicine

## 2015-10-13 ENCOUNTER — Ambulatory Visit (INDEPENDENT_AMBULATORY_CARE_PROVIDER_SITE_OTHER): Payer: Medicare Other | Admitting: Internal Medicine

## 2015-10-13 VITALS — BP 125/86 | HR 100 | Temp 98.2°F | Ht 72.5 in | Wt 237.1 lb

## 2015-10-13 DIAGNOSIS — I10 Essential (primary) hypertension: Secondary | ICD-10-CM

## 2015-10-13 DIAGNOSIS — N401 Enlarged prostate with lower urinary tract symptoms: Secondary | ICD-10-CM

## 2015-10-13 DIAGNOSIS — Z0000 Encounter for general adult medical examination without abnormal findings: Secondary | ICD-10-CM

## 2015-10-13 DIAGNOSIS — E119 Type 2 diabetes mellitus without complications: Secondary | ICD-10-CM

## 2015-10-13 DIAGNOSIS — R351 Nocturia: Secondary | ICD-10-CM

## 2015-10-13 DIAGNOSIS — E1165 Type 2 diabetes mellitus with hyperglycemia: Secondary | ICD-10-CM

## 2015-10-13 DIAGNOSIS — F200 Paranoid schizophrenia: Secondary | ICD-10-CM

## 2015-10-13 DIAGNOSIS — Z7984 Long term (current) use of oral hypoglycemic drugs: Secondary | ICD-10-CM

## 2015-10-13 DIAGNOSIS — Z79899 Other long term (current) drug therapy: Secondary | ICD-10-CM

## 2015-10-13 DIAGNOSIS — Z13818 Encounter for screening for other digestive system disorders: Secondary | ICD-10-CM

## 2015-10-13 DIAGNOSIS — F17200 Nicotine dependence, unspecified, uncomplicated: Secondary | ICD-10-CM

## 2015-10-13 LAB — GLUCOSE, CAPILLARY: Glucose-Capillary: 157 mg/dL — ABNORMAL HIGH (ref 65–99)

## 2015-10-13 LAB — POCT GLYCOSYLATED HEMOGLOBIN (HGB A1C): Hemoglobin A1C: 7.9

## 2015-10-13 MED ORDER — LOSARTAN POTASSIUM 100 MG PO TABS: 100.0000 mg | ORAL_TABLET | Freq: Every day | ORAL | Status: DC

## 2015-10-13 NOTE — Progress Notes (Signed)
Subjective:   Patient ID: Wayne Schmidt male   DOB: [birth year] 60 y.o.   MRN: [id number]  HPI: Wayne Schmidt is a 60 y.o. man with PMHx detailed below presenting for follow up of his hypertension and diabetes. His dose of fluphenazine injections at behavioral health was recently adjusted which he thinks has made his mood and tremors a bit worse but otherwise feels well over the interval. He has not substantially changed his diet and is taking all medications as directed. He also continues to have urinary frequency that prevents him from sleeping more than about 4 hours consecutively.  See problem based assessment and plan below for additional details.  Past Medical History  Diagnosis Date  . Hypertension   . Schizophrenia ([place])   . Microcytosis   . Substance abuse     tobacco use  . High risk sexual behavior 10/2007   Current Outpatient Prescriptions  Medication Sig Dispense Refill  . ciclopirox (LOPROX) 0.77 % SUSP     . doxazosin (CARDURA) 2 MG tablet TAKE 1 TABLET (2 MG TOTAL) BY MOUTH AT BEDTIME. 90 tablet 3  . fluPHENAZine decanoate (PROLIXIN) 25 MG/ML injection Inject 1.5 mLs (37.5 mg total) into the muscle every 14 (fourteen) days. 5 mL 2  . ibuprofen (ADVIL,MOTRIN) 800 MG tablet Take 1 tablet (800 mg total) by mouth every 8 (eight) hours as needed for moderate pain. 90 tablet 0  . losartan (COZAAR) 100 MG tablet Take 1 tablet (100 mg total) by mouth daily. 30 tablet 3  . metFORMIN (GLUCOPHAGE) 500 MG tablet Take 1 tablet (500 mg total) by mouth 2 (two) times daily with a meal. (Patient not taking: Reported on 11/05/2014) 60 tablet 11  . pravastatin (PRAVACHOL) 40 MG tablet TAKE 1 TABLET (40 MG TOTAL) BY MOUTH DAILY. 90 tablet 3   No current facility-administered medications for this visit.   Family History  Problem Relation Age of Onset  . Diabetes Mother   . Hypertension Mother   . Lupus Sister   . Sickle cell anemia Brother   . Drug abuse Brother    Social History    Social History  . Marital Status: Legally Separated    Spouse Name: N/A  . Number of Children: N/A  . Years of Education: N/A   Social History Main Topics  . Smoking status: Current Every Day Smoker -- 1.00 packs/day for 38 years    Types: Cigarettes  . Smokeless tobacco: None     Comment: cutting back.  Has tried  . Alcohol Use: No     Comment: 40 oz beer/week  . Drug Use: No     Comment: h/o remote cocaine use-denies present use  . Sexual Activity: Not Currently   Other Topics Concern  . None   Social History Narrative   Patient given diabetes card 07/07/2010   Review of Systems: Review of Systems  Constitutional: Negative for malaise/fatigue.  Respiratory: Negative for shortness of breath.   Genitourinary: Positive for frequency. Negative for dysuria.  Musculoskeletal: Negative for falls.  Neurological: Negative for headaches.  Endo/Heme/Allergies: Negative for polydipsia.    Objective:  Physical Exam: Filed Vitals:   10/13/15 1043  BP: 125/86  Pulse: 100  Temp: 98.2 F (36.8 C)  TempSrc: Oral  Height: 6' 0.5" (1.842 m)  Weight: 237 lb 1.6 oz (107.548 kg)  SpO2: 99%   GENERAL- alert, co-operative, NAD HEENT- Atraumatic, poor dentition with numerous missing teeth CARDIAC- RRR, no murmurs, rubs or gallops. RESP- CTAB, no wheezes  or crackles. ABDOMEN- Soft, nontender, no guarding or rebound EXTREMITIES- symmetric, no pedal edema SKIN- Warm, dry, No rash or lesion. PSYCH- Flat affect, resting tremor in right hand, appropriate speech. Assessment & Plan:

## 2015-10-13 NOTE — Patient Instructions (Signed)
Today we will check your blood for hepatitis C virus and your metabolic panel.  I have discontinued your HCTZ and now the pill is only losartan. If your urinary frequency does not improve we can try adjusting the dose of your doxazosin next visit.  We will need to recheck your sugars in 3 months since if it does not improve from diet and exercise more medication may be required.  A prescription for the shingles vaccine is sent to your pharmacy today. You will need to pick this up from them and inject yourself.

## 2015-10-14 ENCOUNTER — Encounter: Payer: Self-pay | Admitting: Internal Medicine

## 2015-10-14 LAB — BMP8+ANION GAP
Anion Gap: 18 mmol/L (ref 10.0–18.0)
BUN/Creatinine Ratio: 8 — ABNORMAL LOW (ref 10–24)
BUN: 11 mg/dL (ref 8–27)
CHLORIDE: 94 mmol/L — AB (ref 96–106)
CO2: 25 mmol/L (ref 18–29)
Calcium: 9.5 mg/dL (ref 8.6–10.2)
Creatinine, Ser: 1.38 mg/dL — ABNORMAL HIGH (ref 0.76–1.27)
GFR calc Af Amer: 64 mL/min/{1.73_m2} (ref >59)
GFR calc non Af Amer: 55 mL/min/{1.73_m2} — ABNORMAL LOW (ref >59)
GLUCOSE: 143 mg/dL — AB (ref 65–99)
Potassium: 4.5 mmol/L (ref 3.5–5.2)
Sodium: 137 mmol/L (ref 134–144)

## 2015-10-14 LAB — HEPATITIS C ANTIBODY: Hep C Virus Ab: 0.1 s/co ratio (ref 0.0–0.9)

## 2015-10-14 NOTE — Assessment & Plan Note (Signed)
60 y/o man with no significant immunocompromised state.  -Zostavax prescribed today -Screening for hepatitis C

## 2015-10-14 NOTE — Assessment & Plan Note (Signed)
Lab Results  Component Value Date   HGBA1C 7.9 10/13/2015   HGBA1C 7.1 04/08/2015   HGBA1C 6.8 10/12/2014     Assessment: Diabetes control: Worsened Progress toward A1C goal:  Above goal Comments: He reports occasionally increased sugary foods compared to before but not significant diet change. Also seems to not always be taking metformin as directed, although he denies any specific side effect causing him to not take it.  Plan: Medications:  Metformin 500mg  BID Instruction/counseling given: reminded to get eye exam Other plans: Not entirely clear why he stopped taking metformin regularly, as this would be a first line treatment for him. Today he agrees to try resuming this medication and working on his diet and exercise. Might need to try a different oral agent if a1c is uncontrolled and really cannot tolerate metformin.

## 2015-10-14 NOTE — Assessment & Plan Note (Signed)
Urinary frequency with about q4hrs nocturia. HE is not sure if he senses incomplete emptying. Will discontinue HCTZ today and reassess. He is also on a minimal dose of doxazosin (2mg ).  -May titrate up doxazosin at follow up if symptoms not improving with discontinuing diuretic.

## 2015-10-14 NOTE — Assessment & Plan Note (Signed)
BP Readings from Last 3 Encounters:  10/13/15 125/86  10/07/15 130/80  09/23/15 153/87    Lab Results  Component Value Date   NA 137 10/13/2015   K 4.5 10/13/2015   CREATININE 1.38* 10/13/2015    Assessment: Blood pressure control: Well controlled Progress toward BP goal:  At goal Comments: Blood pressure is very good today. Due to his complaint of urinary frequency I would like to see if he gets some improvement with discontinuing HCTZ.  Plan: Medications:  Discontinue HCTZ 12.5mg , continue losartan 100mg  only Other plans: He needs follow up with metabolic panel and repeat BP check off diuretic.

## 2015-10-18 NOTE — Progress Notes (Signed)
Case discussed with Dr. Rice at the time of the visit.  We reviewed the resident's history and exam and pertinent patient test results.  I agree with the assessment, diagnosis, and plan of care documented in the resident's note. 

## 2015-10-21 ENCOUNTER — Ambulatory Visit (INDEPENDENT_AMBULATORY_CARE_PROVIDER_SITE_OTHER): Payer: 59

## 2015-10-21 VITALS — BP 148/88 | Ht 72.5 in

## 2015-10-21 DIAGNOSIS — F200 Paranoid schizophrenia: Secondary | ICD-10-CM | POA: Diagnosis not present

## 2015-10-21 MED ORDER — FLUPHENAZINE DECANOATE 25 MG/ML IJ SOLN
37.5000 mg | Freq: Once | INTRAMUSCULAR | Status: AC
  Administered 2015-10-21: 37.5 mg via INTRAMUSCULAR

## 2015-10-21 NOTE — Progress Notes (Signed)
Patient ID: Wayne Schmidt, male   DOB: 06-13-1956, 60 y.o.   MRN: [id number] Patient presents today for injection of fluphenazine Dec 37.5 mg. Patient was talkative and had an appropriate affect. Patient has no suicidal or homicidal thoughts and is eating and sleeping well. Patient did complain about some muscle cramping in his left arm, he was advised to speak to his PCP about this. Injection of Fluphenazine Dec 37.5 mg was prepared as ordered and 1.5 mL was injected IM into Right deltoid. Patient tolerated procedure well and without complaint and agreed to come back in two weeks.

## 2015-10-22 ENCOUNTER — Encounter: Payer: Self-pay | Admitting: Internal Medicine

## 2015-10-26 ENCOUNTER — Encounter (HOSPITAL_COMMUNITY): Payer: Self-pay | Admitting: Psychiatry

## 2015-10-26 ENCOUNTER — Ambulatory Visit (INDEPENDENT_AMBULATORY_CARE_PROVIDER_SITE_OTHER): Payer: 59 | Admitting: Psychiatry

## 2015-10-26 VITALS — BP 142/80 | HR 81 | Ht 72.5 in | Wt 232.6 lb

## 2015-10-26 DIAGNOSIS — F200 Paranoid schizophrenia: Secondary | ICD-10-CM

## 2015-10-26 MED ORDER — FLUPHENAZINE DECANOATE 25 MG/ML IJ SOLN: 37.5000 mg | INTRAMUSCULAR | Status: DC

## 2015-10-26 MED ORDER — HYDROXYZINE HCL 10 MG PO TABS: 10.0000 mg | ORAL_TABLET | Freq: Every day | ORAL | Status: DC

## 2015-10-26 NOTE — Progress Notes (Signed)
[name] [phone] Progress Note  Wayne Schmidt [id number] 60 y.o.  10/26/2015 10:08 AM  Chief Complaint: I have trouble sleeping.  I feel anxious at night .            History of Present Illness: Wayne Schmidt came for his followup appointment.  He is getting his Prolixin injection every 2 weeks and reported no side effects.  Sometime he feels anxious and having racing thoughts and insomnia.  He also complaining of muscle stiffness in his hands but denies any tremors or any akathisia.  In the past he had tried Cogentin and Artane but did not like the side effects.  He recently seen his primary care physician and he was given metformin however he stopped metformin because he was feeling very sedated dizzy .  He had blood work.  His creatinine is 1.38.  His POC hemoglobin A1c is 7.9 be slightly increase from his last result.  He admitted some time not able to watch his diet overall he denies any paranoia, hallucination, suicidal thoughts or homicidal thought.  He is not aggressive or agitated.  His energy level is fair.  He is not drinking alcohol or using any illegal substances.  He lives by himself but his daughter frequently visits them.  Patient wants to continue Prolixin.  He denies any irritability , crying spells or any feeling of hopelessness or worthlessness.  His appetite is okay.  His vitals are stable.   Suicidal Ideation: No Plan Formed: No Patient has means to carry out plan: No  Homicidal Ideation: No Plan Formed: No Patient has means to carry out plan: No  Review of Systems  Constitutional: Negative.   Cardiovascular: Negative for chest pain and palpitations.  Skin: Negative.   Neurological: Positive for tremors. Negative for dizziness and seizures.  Psychiatric/Behavioral: Negative for suicidal ideas, hallucinations and substance abuse. The patient is nervous/anxious and has insomnia.    Psychiatric: Agitation: No Hallucination: No Depressed Mood: No Insomnia:  No Hypersomnia: No Altered Concentration: No Feels Worthless: No Grandiose Ideas: No Belief In Special Powers: No New/Increased Substance Abuse: No Compulsions: No  Neurologic: Headache: No Seizure: No Paresthesias: No  Medical history:  Patient has a history of diabetes which is diet controlled, high cholesterol, hypertension, prostate hypertrophy and laminectomy.  Patient see [facility].  Outpatient Encounter Prescriptions as of 10/26/2015  Medication Sig  . ciclopirox (LOPROX) 0.77 % SUSP   . doxazosin (CARDURA) 2 MG tablet TAKE 1 TABLET (2 MG TOTAL) BY MOUTH AT BEDTIME.  . fluPHENAZine decanoate (PROLIXIN) 25 MG/ML injection Inject 1.5 mLs (37.5 mg total) into the muscle every 14 (fourteen) days.  . hydrOXYzine (ATARAX/VISTARIL) 10 MG tablet Take 1 tablet (10 mg total) by mouth at bedtime.  Marland Kitchen ibuprofen (ADVIL,MOTRIN) 800 MG tablet Take 1 tablet (800 mg total) by mouth every 8 (eight) hours as needed for moderate pain.  Marland Kitchen losartan (COZAAR) 100 MG tablet Take 1 tablet (100 mg total) by mouth daily.  . metFORMIN (GLUCOPHAGE) 500 MG tablet Take 1 tablet (500 mg total) by mouth 2 (two) times daily with a meal. (Patient not taking: Reported on 11/05/2014)  . pravastatin (PRAVACHOL) 40 MG tablet TAKE 1 TABLET (40 MG TOTAL) BY MOUTH DAILY.  . [DISCONTINUED] fluPHENAZine decanoate (PROLIXIN) 25 MG/ML injection Inject 1.5 mLs (37.5 mg total) into the muscle every 14 (fourteen) days.   No facility-administered encounter medications on file as of 10/26/2015.    Past Psychiatric History/Hospitalization(s): Anxiety: No Bipolar Disorder: No Depression:  Yes Mania: No Psychosis: Yes Schizophrenia: Yes Personality Disorder: No Hospitalization for psychiatric illness: Patient has a long history of psychiatric illness with multiple hospitalization.  He recall at least 16-20 times hospitalized.  He was admitted at [facility] for psychosis hallucination and paranoia.  In the past  he had tried Haldol and Thorazine but endorse that he was allergic to these medication.  He is taking Prolixin injection for past 30 years.  He has at least 2 psychiatric admission because of suicidal attempt when he took overdose on his medication. History of Electroconvulsive Shock Therapy: No Prior Suicide Attempts: At least 2 psychiatric admission do to suicidal attempt when he took overdose on his medication.  Physical Exam: Constitutional:  BP 142/80 mmHg  Pulse 81  Ht 6' 0.5" (1.842 m)  Wt 232 lb 9.6 oz (105.507 kg)  BMI 31.10 kg/m2  Recent Results (from the past 2160 hour(s))  Glucose, capillary     Status: Abnormal   Collection Time: 10/13/15 11:02 AM  Result Value Ref Range   Glucose-Capillary 157 (H) 65 - 99 mg/dL  POC Hbg A1C     Status: None   Collection Time: 10/13/15 11:11 AM  Result Value Ref Range   Hemoglobin A1C 7.9   Hepatitis C antibody     Status: None   Collection Time: 10/13/15 11:51 AM  Result Value Ref Range   Hep C Virus Ab 0.1 0.0 - 0.9 s/co ratio    Comment:                                   Negative:     < 0.8                              Indeterminate: 0.8 - 0.9                                   Positive:     > 0.9  The CDC recommends that a positive HCV antibody result  be followed up with a HCV Nucleic Acid Amplification  test [id number]).   BMP8+Anion Gap     Status: Abnormal   Collection Time: 10/13/15 11:51 AM  Result Value Ref Range   Glucose 143 (H) 65 - 99 mg/dL   BUN 11 8 - 27 mg/dL   Creatinine, Ser 1.38 (H) 0.76 - 1.27 mg/dL   GFR calc non Af Amer 55 (L) >59 mL/min/1.73   GFR calc Af Amer 64 >59 mL/min/1.73   BUN/Creatinine Ratio 8 (L) 10 - 24   Sodium 137 134 - 144 mmol/L   Potassium 4.5 3.5 - 5.2 mmol/L   Chloride 94 (L) 96 - 106 mmol/L   CO2 25 18 - 29 mmol/L   Anion Gap 18.0 10.0 - 18.0 mmol/L   Calcium 9.5 8.6 - 10.2 mg/dL    General Appearance: alert, oriented, no acute distress and well  nourished  Musculoskeletal: Strength & Muscle Tone: within normal limits Gait & Station: normal Patient leans: N/A   Mental status examination Patient is casually dressed and groomed.  He is pleasant and cooperative.  He maintained fair eye contact.  His speech is slow and sometimes rambling.  He denies any delusion or any paranoia.  He described his mood neutral and his affect is appropriate.  His  thought process is slow and circumstantial.  His psychomotor activity is slow.  His fund of knowledge is average.  He has mild tremors on his left hand.  His attention and concentration is fair.  He is alert and oriented x3.  His insight judgment and impulse control is okay.  Established Problem, Stable/Improving (1), Review of Psycho-Social Stressors (1), Review or order clinical lab tests (1), Review and summation of old records (2), Review of Last Therapy Session (1), Review of Medication Regimen & Side Effects (2) and Review of New Medication or Change in Dosage (2)  Assessment: Axis I: Chronic schizophrenia paranoid type  Axis II: Deferred  Axis III:  Patient Active Problem List   Diagnosis Date Noted  . Health maintenance examination 04/01/2014  . Lumbar arthropathy ([place]) 08/04/2013  . Cervical muscle pain 08/04/2013  . Episodic lightheadedness 04/15/2013  . 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 MICROCYTOSIS 02/24/2010  . Benign prostatic hypertrophy (BPH) with nocturia 03/24/2009  . Paranoid schizophrenia, subchronic condition ([place]) 10/11/2006  . DYSLIPIDEMIA 08/28/2006  . Diabetes mellitus without complication ([place]) 123456  . TOBACCO ABUSE 04/27/2006  . Essential hypertension 04/27/2006    Plan:  I reviewed blood work results, His creatinine is 1.38.  He was recently prescribed metformin but he is not taking it due to side effects.  He likes Prolixin injection every 2 weeks.  He has mild tremors and occasional complaint of stiffness.  He had tried Cogentin and Artane with  side effects .  I recommended to try low dose Vistaril to help insomnia, muscle stiffness and nervousness.  Start Vistaril 10 mg at bedtime.  Continue Prolixin 37.5 mg every 2 weeks.  I strongly suggested and recommended to call his primary care physician about metformin side effects.  I will also send my notes to his primary care physician.  Recommended to call us back if he is any question, concern if he feel worsening of the symptom.  Follow-up in 3 months.  Discuss safety plan that anytime having active suicidal thoughts or homicidal thoughts and he need to call 911 or go to the local emergency room.  Ellene Bloodsaw T., MD 10/26/2015

## 2015-11-04 ENCOUNTER — Ambulatory Visit (HOSPITAL_COMMUNITY): Payer: 59

## 2015-11-04 VITALS — BP 144/94 | HR 97 | Ht 77.0 in | Wt 232.6 lb

## 2015-11-04 DIAGNOSIS — F200 Paranoid schizophrenia: Secondary | ICD-10-CM

## 2015-11-04 MED ORDER — FLUPHENAZINE DECANOATE 25 MG/ML IJ SOLN
37.5000 mg | Freq: Once | INTRAMUSCULAR | Status: AC
  Administered 2015-11-04: 37.5 mg via INTRAMUSCULAR

## 2015-11-04 NOTE — Progress Notes (Signed)
Patient ID: Wayne Schmidt, male   DOB: 11/25/1955, 60 y.o.   MRN: [id number] Patient presented with flat affect, level and present mood and reported some increased stiffness. Patient with noted cog wheeling in right forearm.  No auditory or visual hallucinations, no thoughts of wanting to harm self or others.  Patient's blood pressure up to 144/94 this date as patient reported he had not taken BP medication this date.  Patient agreed to report any increased or continued stiffness and will inform Dr. Adele Schilder of current increased periods of stiffness in case patient needs medication to assist.  Patient with some continued EPS with symptoms of parkinsonians movements but denies as concern at this time.

## 2015-11-17 DIAGNOSIS — L602 Onychogryphosis: Secondary | ICD-10-CM | POA: Diagnosis not present

## 2015-11-17 DIAGNOSIS — E1351 Other specified diabetes mellitus with diabetic peripheral angiopathy without gangrene: Secondary | ICD-10-CM | POA: Diagnosis not present

## 2015-11-18 ENCOUNTER — Encounter (HOSPITAL_COMMUNITY): Payer: Self-pay

## 2015-11-18 ENCOUNTER — Ambulatory Visit (INDEPENDENT_AMBULATORY_CARE_PROVIDER_SITE_OTHER): Payer: Medicaid Other

## 2015-11-18 VITALS — BP 150/96 | HR 82 | Ht 72.5 in | Wt 234.6 lb

## 2015-11-18 DIAGNOSIS — F200 Paranoid schizophrenia: Secondary | ICD-10-CM

## 2015-11-18 MED ORDER — FLUPHENAZINE HCL 2.5 MG/ML IJ SOLN
37.5000 mg | INTRAMUSCULAR | Status: DC
  Administered 2015-11-18: 37.5 mg via INTRAMUSCULAR

## 2015-11-18 NOTE — Progress Notes (Signed)
Patient ID: Wayne Schmidt, male   DOB: [birth year], 60 y.o.   MRN: [id number] Patient presents today with appropriate affect for his injection of Fluphenazine 37.5 mg. Patient reports eating and sleeping well, no SI/HI and no visual or auditory hallucinations. The injection of Fluphenazine was prepared as ordered and administered IM in patients Right deltoid. Patient tolerated procedure well and without complaint and has agreed to return in 2 weeks.

## 2015-12-02 ENCOUNTER — Ambulatory Visit (INDEPENDENT_AMBULATORY_CARE_PROVIDER_SITE_OTHER): Payer: 59

## 2015-12-02 VITALS — HR 90 | Ht 72.5 in | Wt 235.8 lb

## 2015-12-02 DIAGNOSIS — F200 Paranoid schizophrenia: Secondary | ICD-10-CM | POA: Diagnosis not present

## 2015-12-02 MED ORDER — FLUPHENAZINE DECANOATE 25 MG/ML IJ SOLN
25.0000 mg | Freq: Once | INTRAMUSCULAR | Status: AC
  Administered 2015-12-02: 25 mg via INTRAMUSCULAR

## 2015-12-02 NOTE — Progress Notes (Signed)
Patient ID: Wayne Schmidt, male   DOB: 11-30-1955, 60 y.o.   MRN: [id number] Patient presents today with appropriate affect for his injection of Fluphenazine 37.5 mg. Patient reports that he is eating and sleeping well, no SI/HI and no visual or audio hallucinations. The injection of Fluphenazine 37.5 mg  was prepares as ordered and administered IM in patients left deltoid. Patient tolerated the procedure well and without complaint. Patient has agreed to come back in 2 weeks and will call with any questions or problems.

## 2015-12-16 ENCOUNTER — Ambulatory Visit (INDEPENDENT_AMBULATORY_CARE_PROVIDER_SITE_OTHER): Payer: 59

## 2015-12-16 VITALS — BP 160/90 | Ht 72.5 in | Wt 235.2 lb

## 2015-12-16 DIAGNOSIS — F200 Paranoid schizophrenia: Secondary | ICD-10-CM

## 2015-12-16 MED ORDER — FLUPHENAZINE DECANOATE 25 MG/ML IJ SOLN: 25.0000 mg | INTRAMUSCULAR | Status: DC

## 2015-12-16 MED ORDER — FLUPHENAZINE DECANOATE 25 MG/ML IJ SOLN
37.5000 mg | INTRAMUSCULAR | Status: DC
  Administered 2015-12-16: 37.5 mg via INTRAMUSCULAR

## 2015-12-16 NOTE — Progress Notes (Signed)
Patient ID: Wayne Schmidt, male   DOB: 07-24-1955, 60 y.o.   MRN: [id number] Patient presents today with appropriate affect for his injection of Fluphenazine 37.5 mg. Patient reports that he is eating and sleeping well, no SI/HI and no visual or audio hallucinations. The injection of Fluphenazine 37.5 mg was prepared as ordered and administered IM in patients right deltoid. Patient tolerated the procedure well and without complaint. Patient has agreed to come back in 2 weeks and will call with any questions or problems

## 2015-12-30 ENCOUNTER — Ambulatory Visit (INDEPENDENT_AMBULATORY_CARE_PROVIDER_SITE_OTHER): Payer: 59

## 2015-12-30 ENCOUNTER — Encounter (HOSPITAL_COMMUNITY): Payer: Self-pay

## 2015-12-30 VITALS — BP 140/88 | HR 105 | Ht 72.5 in | Wt 232.6 lb

## 2015-12-30 DIAGNOSIS — F200 Paranoid schizophrenia: Secondary | ICD-10-CM | POA: Diagnosis not present

## 2015-12-30 MED ORDER — FLUPHENAZINE DECANOATE 25 MG/ML IJ SOLN
37.5000 mg | INTRAMUSCULAR | Status: DC
  Administered 2015-12-30: 37.5 mg via INTRAMUSCULAR

## 2015-12-30 NOTE — Progress Notes (Signed)
Patient ID: Wayne Schmidt, male   DOB: 25-Jul-1955, 60 y.o.   MRN: [id number] Patient presents today with appropriate affect for his injection of Fluphenazine 37.5 mg. Patient reports that he is eating and sleeping well, no SI/HI and no visual or audio hallucinations. The injection of Fluphenazine 37.5 mg was prepared as ordered and administered IM in patients left deltoid. Patient tolerated the procedure well and without complaint. Patient has agreed to come back in 2 weeks and will call with any questions or problems

## 2016-01-11 ENCOUNTER — Encounter: Payer: Self-pay | Admitting: Internal Medicine

## 2016-01-11 ENCOUNTER — Ambulatory Visit (INDEPENDENT_AMBULATORY_CARE_PROVIDER_SITE_OTHER): Payer: Medicare Other | Admitting: Internal Medicine

## 2016-01-11 VITALS — BP 153/88 | HR 84 | Temp 98.4°F | Wt 234.1 lb

## 2016-01-11 DIAGNOSIS — B351 Tinea unguium: Secondary | ICD-10-CM | POA: Diagnosis not present

## 2016-01-11 DIAGNOSIS — E785 Hyperlipidemia, unspecified: Secondary | ICD-10-CM

## 2016-01-11 DIAGNOSIS — F200 Paranoid schizophrenia: Secondary | ICD-10-CM

## 2016-01-11 DIAGNOSIS — I10 Essential (primary) hypertension: Secondary | ICD-10-CM | POA: Diagnosis not present

## 2016-01-11 DIAGNOSIS — E119 Type 2 diabetes mellitus without complications: Secondary | ICD-10-CM | POA: Diagnosis not present

## 2016-01-11 DIAGNOSIS — F17200 Nicotine dependence, unspecified, uncomplicated: Secondary | ICD-10-CM

## 2016-01-11 LAB — POCT GLYCOSYLATED HEMOGLOBIN (HGB A1C): Hemoglobin A1C: 7.2

## 2016-01-11 LAB — GLUCOSE, CAPILLARY: Glucose-Capillary: 142 mg/dL — ABNORMAL HIGH (ref 65–99)

## 2016-01-11 NOTE — Assessment & Plan Note (Signed)
  Assessment: Progress toward smoking cessation:   deteriorated Barriers to progress toward smoking cessation:   does not want to quit Comments: patient smoking more than one PPD  Plan: Instruction/counseling given:  I counseled patient on the dangers of tobacco use, advised patient to stop smoking, and reviewed strategies to maximize success. Educational resources provided:    Self management tools provided:    Medications to assist with smoking cessation:  None Patient agreed to the following self-care plans for smoking cessation:    Other plans: Patient not ready to quit or cut back at this time. Will readdress on follow up

## 2016-01-11 NOTE — Assessment & Plan Note (Signed)
-   c/w pravastatin for now - Will check lipid panel and consider increasing him to a high intensity statin if warranted

## 2016-01-11 NOTE — Assessment & Plan Note (Signed)
-   Patient has chronic onychomycosis and f/u with podiatry every 6 months - No further intervention for now

## 2016-01-11 NOTE — Assessment & Plan Note (Signed)
-   follows closely with psych for this  - He is on fluphenazine once every 2 weeks - He denies auditory/visual hallucinations, no insomnia - F/u psych

## 2016-01-11 NOTE — Assessment & Plan Note (Signed)
BP Readings from Last 3 Encounters:  01/11/16 153/88  12/30/15 140/88  12/16/15 160/90    Lab Results  Component Value Date   NA 137 10/13/2015   K 4.5 10/13/2015   CREATININE 1.38* 10/13/2015    Assessment: Blood pressure control:  fair Progress toward BP goal:   deteriorated Comments: Patient is compliant with his cozaar  Plan: Medications:  continue current medications Educational resources provided:   Self management tools provided:   Other plans: Explained elevated BP to patient but he would not want to start taking another medication at this time. We will recheck on follow up and if elevated consider adding norvasc if elevated

## 2016-01-11 NOTE — Patient Instructions (Addendum)
-   It was a pleasure seeing you today - We will check your bloodwork and do a urine test today - Please try and reduce your smoking - Your BP is a little high today. If it remains high on follow up we may need to add another medication - Your A1C has improved. Please monitor your diet and continue with exercise  Exercising to Lose Weight Exercising can help you to lose weight. In order to lose weight through exercise, you need to do vigorous-intensity exercise. You can tell that you are exercising with vigorous intensity if you are breathing very hard and fast and cannot hold a conversation while exercising. Moderate-intensity exercise helps to maintain your current weight. You can tell that you are exercising at a moderate level if you have a higher heart rate and faster breathing, but you are still able to hold a conversation. HOW OFTEN SHOULD I EXERCISE? Choose an activity that you enjoy and set realistic goals. Your health care provider can help you to make an activity plan that works for you. Exercise regularly as directed by your health care provider. This may include:  Doing resistance training twice each week, such as:  Push-ups.  Sit-ups.  Lifting weights.  Using resistance bands.  Doing a given intensity of exercise for a given amount of time. Choose from these options:  150 minutes of moderate-intensity exercise every week.  75 minutes of vigorous-intensity exercise every week.  A mix of moderate-intensity and vigorous-intensity exercise every week. Children, pregnant women, people who are out of shape, people who are overweight, and older adults may need to consult a health care provider for individual recommendations. If you have any sort of medical condition, be sure to consult your health care provider before starting a new exercise program. WHAT ARE SOME ACTIVITIES THAT CAN HELP ME TO LOSE WEIGHT?   Walking at a rate of at least 4.5 miles an hour.  Jogging or running  at a rate of 5 miles per hour.  Biking at a rate of at least 10 miles per hour.  Lap swimming.  Roller-skating or in-line skating.  Cross-country skiing.  Vigorous competitive sports, such as football, basketball, and soccer.  Jumping rope.  Aerobic dancing. HOW CAN I BE MORE ACTIVE IN MY DAY-TO-DAY ACTIVITIES?  Use the stairs instead of the elevator.  Take a walk during your lunch break.  If you drive, park your car farther away from work or school.  If you take public transportation, get off one stop early and walk the rest of the way.  Make all of your phone calls while standing up and walking around.  Get up, stretch, and walk around every 30 minutes throughout the day. WHAT GUIDELINES SHOULD I FOLLOW WHILE EXERCISING?  Do not exercise so much that you hurt yourself, feel dizzy, or get very short of breath.  Consult your health care provider prior to starting a new exercise program.  Wear comfortable clothes and shoes with good support.  Drink plenty of water while you exercise to prevent dehydration or heat stroke. Body water is lost during exercise and must be replaced.  Work out until you breathe faster and your heart beats faster.   This information is not intended to replace advice given to you by your health care provider. Make sure you discuss any questions you have with your health care provider.   Document Released: 07/22/2010 Document Revised: 07/10/2014 Document Reviewed: 11/20/2013 Elsevier Interactive Patient Education [employer].

## 2016-01-11 NOTE — Assessment & Plan Note (Addendum)
Lab Results  Component Value Date   HGBA1C 7.2 01/11/2016   HGBA1C 7.9 10/13/2015   HGBA1C 7.1 04/08/2015     Assessment: Diabetes control:  fair Progress toward A1C goal:   improved Comments: patient has stopped taking his metformin  Plan: Medications:  off all medication currently Home glucose monitoring: Frequency:   Timing:   Instruction/counseling given: discussed foot care, discussed the need for weight loss and discussed diet Educational resources provided:   Self management tools provided:   Other plans: will check BMP, urine microalbumin today. Patient does not want to start a new medication at this time and would like to reach goal with diet and exercise. Will recheck on follow up

## 2016-01-11 NOTE — Progress Notes (Signed)
   Subjective:    Patient ID: Wayne Schmidt, male    DOB: [birth year], 60 y.o.   MRN: [id number]  HPI Patient seen and examined. He is here for routine follow up of his HTN and DM.  Of note, he has stopped taking his metformin as he says it makes him feel "out of it". He does not want to try any new medications currently and wants to work on his diet and exercise for now and attempt to get to goal. He is also smoking more than 1 PPD and does not want to quit.    Review of Systems  Constitutional: Negative.   HENT: Negative.   Respiratory: Negative.   Cardiovascular: Positive for chest pain.       Patient complains of intermittent left CP in mid axillary line, positional, sharp, occurs only while adjusting himself in bed, no relation to exertion, no diaphoresis, no palpitations, no nausea  Gastrointestinal: Negative.   Musculoskeletal: Negative.   Neurological: Negative.   Psychiatric/Behavioral: Negative.        Objective:   Physical Exam  Constitutional: He is oriented to person, place, and time. He appears well-developed and well-nourished.  HENT:  Head: Normocephalic and atraumatic.  Neck: Normal range of motion. Neck supple.  Cardiovascular: Normal rate, regular rhythm and normal heart sounds.   Pulses:      Dorsalis pedis pulses are 2+ on the right side, and 2+ on the left side.  Pulmonary/Chest: Effort normal and breath sounds normal. No respiratory distress. He has no wheezes.  Abdominal: Soft. Bowel sounds are normal. He exhibits no distension. There is no tenderness.  Musculoskeletal: Normal range of motion. He exhibits no edema.  Lymphadenopathy:    He has no cervical adenopathy.  Neurological: He is alert and oriented to person, place, and time.  Skin: Skin is warm. No rash noted.  Psychiatric: He has a normal mood and affect. His behavior is normal.          Assessment & Plan:  Please see problem based cahrting for assessment and plan:

## 2016-01-12 ENCOUNTER — Telehealth: Payer: Self-pay | Admitting: Internal Medicine

## 2016-01-12 LAB — LIPID PANEL
CHOLESTEROL TOTAL: 166 mg/dL (ref 100–199)
Chol/HDL Ratio: 2.4 ratio units (ref 0.0–5.0)
HDL: 68 mg/dL (ref >39)
LDL CALC: 87 mg/dL (ref 0–99)
TRIGLYCERIDES: 55 mg/dL (ref 0–149)
VLDL Cholesterol Cal: 11 mg/dL (ref 5–40)

## 2016-01-12 LAB — MICROALBUMIN / CREATININE URINE RATIO
CREATININE, UR: 53.1 mg/dL
MICROALB/CREAT RATIO: 38 mg/g creat — ABNORMAL HIGH (ref 0.0–30.0)
MICROALBUM., U, RANDOM: 20.2 ug/mL

## 2016-01-12 LAB — BMP8+ANION GAP
ANION GAP: 19 mmol/L — AB (ref 10.0–18.0)
BUN/Creatinine Ratio: 8 — ABNORMAL LOW (ref 10–24)
BUN: 10 mg/dL (ref 8–27)
CALCIUM: 9.2 mg/dL (ref 8.6–10.2)
CHLORIDE: 97 mmol/L (ref 96–106)
CO2: 22 mmol/L (ref 18–29)
CREATININE: 1.28 mg/dL — AB (ref 0.76–1.27)
GFR calc Af Amer: 69 mL/min/{1.73_m2} (ref >59)
GFR calc non Af Amer: 60 mL/min/{1.73_m2} (ref >59)
GLUCOSE: 121 mg/dL — AB (ref 65–99)
Potassium: 4.7 mmol/L (ref 3.5–5.2)
Sodium: 138 mmol/L (ref 134–144)

## 2016-01-12 NOTE — Telephone Encounter (Signed)
Results of bloodwork discussed with patient. Creatinine mildly improved from 3 months ago. Will monitor. Lipid panel noted as well. Will c/w current statin therapy for now.  Patient expresses understanding

## 2016-01-13 ENCOUNTER — Ambulatory Visit (INDEPENDENT_AMBULATORY_CARE_PROVIDER_SITE_OTHER): Payer: 59

## 2016-01-13 DIAGNOSIS — F200 Paranoid schizophrenia: Secondary | ICD-10-CM | POA: Diagnosis not present

## 2016-01-13 MED ORDER — FLUPHENAZINE HCL 2.5 MG/ML IJ SOLN
37.5000 mg | INTRAMUSCULAR | Status: DC
Start: 2016-01-13 — End: 2016-01-13
  Administered 2016-01-13: 37.5 mg via INTRAMUSCULAR

## 2016-01-13 NOTE — Progress Notes (Signed)
Patient ID: Wayne Schmidt, male   DOB: May 29, 1956, 60 y.o.   MRN: [id number] Patient presents today with appropriate affect for his injection of Fluphenazine 37.5 mg. Patient reports that he is eating and sleeping well, no SI/HI and no visual or audio hallucinations. The injection of Fluphenazine 37.5 mg was prepared as ordered and administered IM in patients right deltoid. Patient tolerated the procedure well and without complaint. Patient has agreed to come back in 2 weeks and will call with any questions or problems

## 2016-01-14 ENCOUNTER — Encounter: Payer: Self-pay | Admitting: Internal Medicine

## 2016-01-18 ENCOUNTER — Other Ambulatory Visit (HOSPITAL_COMMUNITY): Payer: Self-pay | Admitting: Psychiatry

## 2016-01-25 ENCOUNTER — Ambulatory Visit (HOSPITAL_COMMUNITY): Payer: Self-pay | Admitting: Psychiatry

## 2016-01-27 ENCOUNTER — Ambulatory Visit (INDEPENDENT_AMBULATORY_CARE_PROVIDER_SITE_OTHER): Payer: 59

## 2016-01-27 ENCOUNTER — Encounter (HOSPITAL_COMMUNITY): Payer: Self-pay

## 2016-01-27 DIAGNOSIS — F200 Paranoid schizophrenia: Secondary | ICD-10-CM | POA: Diagnosis not present

## 2016-01-27 MED ORDER — FLUPHENAZINE DECANOATE 25 MG/ML IJ SOLN
37.5000 mg | Freq: Once | INTRAMUSCULAR | Status: AC
  Administered 2016-01-27: 37.5 mg via INTRAMUSCULAR

## 2016-01-27 NOTE — Progress Notes (Signed)
Patient ID: Wayne Schmidt, male   DOB: December 30, 1955, 60 y.o.   MRN: [id number] Patient presents today with appropriate affect for his injection of Fluphenazine 37.5 mg. Patient reports that he is eating and sleeping well, no SI/HI and no visual or audio hallucinations. The injection of Fluphenazine 37.5 mg was prepared as ordered and administered IM in patients right deltoid. Patient requested right again due to shoulder pain. We spoke about the pain and he stated he will be going to his PCP.  Patient tolerated the procedure well and without complaint. Patient has agreed to come back in 2 weeks and will call with any questions or problems

## 2016-02-10 ENCOUNTER — Ambulatory Visit (INDEPENDENT_AMBULATORY_CARE_PROVIDER_SITE_OTHER): Payer: 59

## 2016-02-10 DIAGNOSIS — F200 Paranoid schizophrenia: Secondary | ICD-10-CM | POA: Diagnosis not present

## 2016-02-10 MED ORDER — FLUPHENAZINE DECANOATE 25 MG/ML IJ SOLN
37.5000 mg | Freq: Once | INTRAMUSCULAR | Status: AC
  Administered 2016-02-10: 37.5 mg via INTRAMUSCULAR

## 2016-02-10 NOTE — Progress Notes (Signed)
Patient presents today with appropriate affect for his injection of Fluphenazine 37.5 mg. Patient reports that he is eating and sleeping well, no SI/HI and no visual or audio hallucinations. The injection of Fluphenazine 37.5 mg was prepared as ordered and administered IM in patients left deltoid. Patient tolerated the procedure well and without complaint. Patient has agreed to come back in 2 weeks and will call with any questions or problems

## 2016-02-18 DIAGNOSIS — H25013 Cortical age-related cataract, bilateral: Secondary | ICD-10-CM | POA: Diagnosis not present

## 2016-02-18 DIAGNOSIS — H2513 Age-related nuclear cataract, bilateral: Secondary | ICD-10-CM | POA: Diagnosis not present

## 2016-02-18 DIAGNOSIS — Z0100 Encounter for examination of eyes and vision without abnormal findings: Secondary | ICD-10-CM | POA: Diagnosis not present

## 2016-02-18 DIAGNOSIS — E119 Type 2 diabetes mellitus without complications: Secondary | ICD-10-CM | POA: Diagnosis not present

## 2016-02-18 LAB — HM DIABETES EYE EXAM

## 2016-02-24 ENCOUNTER — Ambulatory Visit (INDEPENDENT_AMBULATORY_CARE_PROVIDER_SITE_OTHER): Payer: 59

## 2016-02-24 DIAGNOSIS — F200 Paranoid schizophrenia: Secondary | ICD-10-CM | POA: Diagnosis not present

## 2016-02-24 MED ORDER — FLUPHENAZINE DECANOATE 25 MG/ML IJ SOLN
37.5000 mg | INTRAMUSCULAR | Status: DC
  Administered 2016-02-24 – 2016-07-27 (×12): 37.5 mg via INTRAMUSCULAR

## 2016-02-24 NOTE — Progress Notes (Signed)
Patient presents today with appropriate affect for his injection of Fluphenazine 37.5 mg. Patient reports that he is eating and sleeping well, no SI/HI and no visual or audio hallucinations. The injection of Fluphenazine 37.5 mg was prepared as ordered and administered IM in patients right deltoid. Patient tolerated the procedure well and without complaint. Patient has agreed to come back in 2 weeks and will call with any questions or problems

## 2016-02-28 ENCOUNTER — Ambulatory Visit (HOSPITAL_COMMUNITY): Payer: Self-pay | Admitting: Psychiatry

## 2016-03-04 ENCOUNTER — Other Ambulatory Visit: Payer: Self-pay | Admitting: Internal Medicine

## 2016-03-04 DIAGNOSIS — I10 Essential (primary) hypertension: Secondary | ICD-10-CM

## 2016-03-09 ENCOUNTER — Ambulatory Visit (INDEPENDENT_AMBULATORY_CARE_PROVIDER_SITE_OTHER): Payer: 59

## 2016-03-09 DIAGNOSIS — F200 Paranoid schizophrenia: Secondary | ICD-10-CM

## 2016-03-09 NOTE — Progress Notes (Signed)
Patient presents today with appropriate affect for his injection of Fluphenazine 37.5 mg. Patient reports that he is eating and sleeping well, no SI/HI and no visual or audio hallucinations. The injection of Fluphenazine 37.5 mg was prepared as ordered and administered IM in patients leftdeltoid. Patient tolerated the procedure well and without complaint. Patient has agreed to come back in 2 weeks and will call with any questions or problems

## 2016-03-23 ENCOUNTER — Encounter (HOSPITAL_COMMUNITY): Payer: Self-pay

## 2016-03-23 ENCOUNTER — Ambulatory Visit (INDEPENDENT_AMBULATORY_CARE_PROVIDER_SITE_OTHER): Payer: 59

## 2016-03-23 DIAGNOSIS — F200 Paranoid schizophrenia: Secondary | ICD-10-CM | POA: Diagnosis not present

## 2016-03-23 NOTE — Progress Notes (Signed)
Patient presents today with appropriate affect for his injection of Fluphenazine 37.5 mg. Patient reports that he is eating and sleeping well, no SI/HI and no visual or audio hallucinations. Patient is complaining of numbness and pain in his arms, he believes it is being caused by the injections. I told patient I would speak with Dr. Adele Schilder and get back to him. The injection of Fluphenazine 37.5 mg was prepared as ordered and 1.5 mL was administered IM in patients rightdeltoid. Patient tolerated the procedure well and without complaint. Patient has agreed to come back in 2 weeks and will call with any questions or problems

## 2016-04-03 ENCOUNTER — Encounter: Payer: Self-pay | Admitting: *Deleted

## 2016-04-06 ENCOUNTER — Ambulatory Visit (INDEPENDENT_AMBULATORY_CARE_PROVIDER_SITE_OTHER): Payer: 59

## 2016-04-06 DIAGNOSIS — F200 Paranoid schizophrenia: Secondary | ICD-10-CM | POA: Diagnosis not present

## 2016-04-06 NOTE — Progress Notes (Signed)
Patient presents today with appropriate affect for his injection of Fluphenazine 37.5 mg. Patient reports that he is eating and sleeping well, no SI/HI and no visual or audio hallucinations. Patient is complaining of numbness and pain in his arms, he believes it is being caused by the injections. I told patient I would speak with Dr. Adele Schilder and get back to him. The injection of Fluphenazine 37.5 mg was prepared as ordered and 1.5 mL was administered IM in patients leftdeltoid. Patient tolerated the procedure well and without complaint. Patient has agreed to come back in 2 weeks and will call with any questions or problems

## 2016-04-10 ENCOUNTER — Telehealth: Payer: Self-pay | Admitting: Internal Medicine

## 2016-04-10 NOTE — Telephone Encounter (Signed)
APT. REMINDER CALL, LMTCB °

## 2016-04-11 ENCOUNTER — Encounter: Payer: Self-pay | Admitting: Internal Medicine

## 2016-04-11 ENCOUNTER — Ambulatory Visit (INDEPENDENT_AMBULATORY_CARE_PROVIDER_SITE_OTHER): Payer: Medicare Other | Admitting: Internal Medicine

## 2016-04-11 VITALS — BP 155/90 | HR 89 | Temp 97.9°F | Ht 72.5 in | Wt 232.7 lb

## 2016-04-11 DIAGNOSIS — N401 Enlarged prostate with lower urinary tract symptoms: Secondary | ICD-10-CM

## 2016-04-11 DIAGNOSIS — I10 Essential (primary) hypertension: Secondary | ICD-10-CM

## 2016-04-11 DIAGNOSIS — F200 Paranoid schizophrenia: Secondary | ICD-10-CM

## 2016-04-11 DIAGNOSIS — E119 Type 2 diabetes mellitus without complications: Secondary | ICD-10-CM | POA: Diagnosis not present

## 2016-04-11 DIAGNOSIS — Z23 Encounter for immunization: Secondary | ICD-10-CM | POA: Diagnosis not present

## 2016-04-11 DIAGNOSIS — R351 Nocturia: Secondary | ICD-10-CM

## 2016-04-11 DIAGNOSIS — R350 Frequency of micturition: Secondary | ICD-10-CM | POA: Diagnosis not present

## 2016-04-11 DIAGNOSIS — Z79899 Other long term (current) drug therapy: Secondary | ICD-10-CM

## 2016-04-11 DIAGNOSIS — F17210 Nicotine dependence, cigarettes, uncomplicated: Secondary | ICD-10-CM

## 2016-04-11 DIAGNOSIS — Z0000 Encounter for general adult medical examination without abnormal findings: Secondary | ICD-10-CM

## 2016-04-11 DIAGNOSIS — F17200 Nicotine dependence, unspecified, uncomplicated: Secondary | ICD-10-CM

## 2016-04-11 LAB — POCT GLYCOSYLATED HEMOGLOBIN (HGB A1C): Hemoglobin A1C: 7.1

## 2016-04-11 LAB — GLUCOSE, CAPILLARY: GLUCOSE-CAPILLARY: 151 mg/dL — AB (ref 65–99)

## 2016-04-11 MED ORDER — DOXAZOSIN MESYLATE 4 MG PO TABS: ORAL_TABLET | ORAL | 3 refills | Status: DC

## 2016-04-11 NOTE — Progress Notes (Signed)
   Subjective:    Patient ID: Wayne Schmidt, male    DOB: 1956-05-18, 60 y.o.   MRN: [id number]  HPI  I have seen and evaluated the patient. He is here for routine follow up of his HTN and DM. He feels well currently.  He does complain of frequent urination which he attributes to being on fluphenazine.    Review of Systems  Constitutional: Negative.   HENT: Negative.   Respiratory: Negative.   Cardiovascular: Negative.   Gastrointestinal: Negative.   Genitourinary: Positive for frequency. Negative for dysuria.  Musculoskeletal: Negative.   Skin: Negative.   Neurological: Negative.   Psychiatric/Behavioral: Negative.        Objective:   Physical Exam  Constitutional: He is oriented to person, place, and time. He appears well-developed and well-nourished.  HENT:  Head: Normocephalic and atraumatic.  Mouth/Throat: No oropharyngeal exudate.  Neck: Normal range of motion. Neck supple.  Cardiovascular: Normal rate, regular rhythm and normal heart sounds.   Pulmonary/Chest: Effort normal and breath sounds normal.  Abdominal: Soft. Bowel sounds are normal. He exhibits no distension. There is no tenderness.  Musculoskeletal: Normal range of motion. He exhibits no edema.  Lymphadenopathy:    He has no cervical adenopathy.  Neurological: He is alert and oriented to person, place, and time.  Skin: Skin is warm. No rash noted. No erythema.  Psychiatric: He has a normal mood and affect. His behavior is normal.          Assessment & Plan:   Please see problem based charting for assessment and plan:

## 2016-04-11 NOTE — Assessment & Plan Note (Signed)
-   Patent complains of frequent urination which he attributes to fluphenazine - Explained that I think it may be secondary to his BPH - Will increase cardura to 4 mg and follow up

## 2016-04-11 NOTE — Assessment & Plan Note (Signed)
-   Flu shot given - Had colonoscopy done last year. Will obtain records from Dr. Benson Norway

## 2016-04-11 NOTE — Assessment & Plan Note (Signed)
BP Readings from Last 3 Encounters:  04/11/16 (!) 155/90  03/09/16 136/90  01/11/16 (!) 153/88    Lab Results  Component Value Date   NA 138 01/11/2016   K 4.7 01/11/2016   CREATININE 1.28 (H) 01/11/2016    Assessment: Blood pressure control:  fair Progress toward BP goal:   deteriorated Comments: compliant with losartan and doxazosin  Plan: Medications:  Will increase doxazosin to 4 mg and contnue with losartan Educational resources provided: brochure (denies) Self management tools provided:   Other plans:

## 2016-04-11 NOTE — Assessment & Plan Note (Signed)
-   Patient is smoking 1 PPD currently - Does not want to stop currently - Advised smoking cessation. Patient states he does not want medication and that nicotine patches did not work - Will readdress on next visit

## 2016-04-11 NOTE — Assessment & Plan Note (Signed)
Lab Results  Component Value Date   HGBA1C 7.1 04/11/2016   HGBA1C 7.2 01/11/2016   HGBA1C 7.9 10/13/2015     Assessment: Diabetes control:  fair Progress toward A1C goal:   improved Comments: diet controlled  Plan: Medications:  diet controlled Home glucose monitoring: Frequency:   Timing:   Instruction/counseling given: discussed the need for weight loss Educational resources provided: brochure (denies ) Self management tools provided:   Other plans:

## 2016-04-11 NOTE — Assessment & Plan Note (Signed)
-   Follows with behavioral therapy - He is on fluphenazine injections every 2 weeks - States his symptoms are well controlled on current regimen - He will follow up with them

## 2016-04-11 NOTE — Patient Instructions (Signed)
-   It was a pleasure seeing you today - We will increase your cardura to 4 mg from 2 mg to help with your enlarged prostate and the frequent urination - We will give you a flu shot today - Your diabetes is well controlled off medication. Please continue with the diet and exercise - Please try and cut back on the smoking. We can prescribe the nicotine patches and gum if you want to try again

## 2016-04-17 ENCOUNTER — Other Ambulatory Visit (HOSPITAL_COMMUNITY): Payer: Self-pay | Admitting: Psychiatry

## 2016-04-17 DIAGNOSIS — F200 Paranoid schizophrenia: Secondary | ICD-10-CM

## 2016-04-18 NOTE — Telephone Encounter (Signed)
Met with Dr. Lovena Le who approved a one time order for patient's hydroxyzine this date in the absence of Dr. Adele Schilder.  New one time order e-scribed to patient's pharmacy as authorized.

## 2016-04-20 ENCOUNTER — Ambulatory Visit (INDEPENDENT_AMBULATORY_CARE_PROVIDER_SITE_OTHER): Payer: 59

## 2016-04-20 DIAGNOSIS — F200 Paranoid schizophrenia: Secondary | ICD-10-CM

## 2016-04-20 NOTE — Progress Notes (Signed)
Patient presents today with appropriate affect for his injection of Fluphenazine 37.5 mg. Patient reports that he is eating and sleeping well, no SI/HI and no visual or audio hallucinations. Patient is still complaining of pain and numbness in his arms and hands. I had recommended he go to an Orthopedic, patient has not done that yet. He would like to go back on oral medication and said he would discuss with Dr. Adele Schilder at his visit next week.. The injection of Fluphenazine 37.5 mg was prepared as ordered and 1.5 mL wasadministered IM in patients rightdeltoid. Patient tolerated the procedure well and without complaint. Patient has agreed to come back in 2 weeks and will call with any questions or problems

## 2016-04-27 ENCOUNTER — Encounter (HOSPITAL_COMMUNITY): Payer: Self-pay | Admitting: Psychiatry

## 2016-04-27 ENCOUNTER — Ambulatory Visit (INDEPENDENT_AMBULATORY_CARE_PROVIDER_SITE_OTHER): Payer: 59 | Admitting: Psychiatry

## 2016-04-27 DIAGNOSIS — F200 Paranoid schizophrenia: Secondary | ICD-10-CM | POA: Diagnosis not present

## 2016-04-27 DIAGNOSIS — Z79899 Other long term (current) drug therapy: Secondary | ICD-10-CM

## 2016-04-27 MED ORDER — HYDROXYZINE HCL 10 MG PO TABS: 10.0000 mg | ORAL_TABLET | Freq: Every day | ORAL | 2 refills | Status: DC

## 2016-04-27 NOTE — Progress Notes (Signed)
[facility] [phone] Progress Note  Wayne Schmidt [id number] 60 y.o.  04/27/2016 10:08 AM  Chief Complaint: I am sleeping better with the medication.              History of Present Illness: Wayne Schmidt came for his followup appointment.  He is sleeping better with low dose hydroxyzine.  He also noticed less stiffness in his joints and his tremors are improved.  He recently seen his primary care physician and his recent creatinine is drop from 1.3-1.28 .  His hemoglobin A1c is 7.1.  He is getting Prolixin Decanoate 37.5 milligrams every 2 weeks .  He admitted that his paranoia , hallucination is under control.  He is still some time hearing voices but they are chronic in nature and does not bother him.  He is not aggressive or irritable.  He is cooperative.  He is pleasant and denies any crying spells, irritability, mood swing or any feeling of hopelessness or worthlessness.  He is happy because he spent some time with his grandkids.  Patient like to continue his Prolixin Decanoate 37.5 mg and low dose hydroxyzine which is helping his sleep, anxiety and muscle stiffness.  Patient denies drinking alcohol or using any illegal substances.  His appetite is okay.  His vital signs are stable.  Suicidal Ideation: No Plan Formed: No Patient has means to carry out plan: No  Homicidal Ideation: No Plan Formed: No Patient has means to carry out plan: No  Review of Systems  Constitutional: Negative.   Cardiovascular: Negative for chest pain and palpitations.  Skin: Negative.   Neurological: Negative for dizziness and seizures.  Psychiatric/Behavioral: Negative for hallucinations, substance abuse and suicidal ideas.   Psychiatric: Agitation: No Hallucination: No Depressed Mood: No Insomnia: No Hypersomnia: No Altered Concentration: No Feels Worthless: No Grandiose Ideas: No Belief In Special Powers: No New/Increased Substance Abuse: No Compulsions: No  Neurologic: Headache:  No Seizure: No Paresthesias: No  Medical history:  Patient has a history of diabetes which is diet controlled, high cholesterol, hypertension, prostate hypertrophy and laminectomy.  Patient see [facility] primary [facility].  Outpatient Encounter Prescriptions as of 04/27/2016  Medication Sig Dispense Refill  . ciclopirox (LOPROX) 0.77 % SUSP     . doxazosin (CARDURA) 2 MG tablet Take 2 mg by mouth.  3  . hydrOXYzine (ATARAX/VISTARIL) 10 MG tablet Take 1 tablet (10 mg total) by mouth at bedtime. 30 tablet 2  . ibuprofen (ADVIL,MOTRIN) 800 MG tablet Take 1 tablet (800 mg total) by mouth every 8 (eight) hours as needed for moderate pain. 90 tablet 0  . losartan (COZAAR) 100 MG tablet TAKE 1 TABLET BY MOUTH EVERY DAY 30 tablet 3  . pravastatin (PRAVACHOL) 40 MG tablet TAKE 1 TABLET (40 MG TOTAL) BY MOUTH DAILY. 90 tablet 3  . [DISCONTINUED] doxazosin (CARDURA) 4 MG tablet TAKE 1 TABLET (4 MG TOTAL) BY MOUTH AT BEDTIME. 30 tablet 3  . [DISCONTINUED] hydrOXYzine (ATARAX/VISTARIL) 10 MG tablet TAKE 1 TABLET (10 MG TOTAL) BY MOUTH AT BEDTIME. 30 tablet 0   Facility-Administered Encounter Medications as of 04/27/2016  Medication Dose Route Frequency Provider Last Rate Last Dose  . fluPHENAZine decanoate (PROLIXIN) injection 37.5 mg  37.5 mg Intramuscular Q14 Days Hampton Abbot, MD   37.5 mg at 04/20/16 1013    Past Psychiatric History/Hospitalization(s): Anxiety: No Bipolar Disorder: No Depression: Yes Mania: No Psychosis: Yes Schizophrenia: Yes Personality Disorder: No Hospitalization for psychiatric illness: Patient has a long history of psychiatric illness with multiple  hospitalization.  He recall at least 16-20 times hospitalized.  He was admitted at [facility] for psychosis hallucination and paranoia.  In the past he had tried Haldol and Thorazine but endorse that he was allergic to these medication.  He is taking Prolixin injection for past 30 years.  He has at least 2 psychiatric  admission because of suicidal attempt when he took overdose on his medication. History of Electroconvulsive Shock Therapy: No Prior Suicide Attempts: At least 2 psychiatric admission do to suicidal attempt when he took overdose on his medication.  Physical Exam: Constitutional:  BP 128/80   Pulse 68   Ht 6' 0.5" (1.842 m)   Wt 230 lb (104.3 kg)   BMI 30.76 kg/m   Recent Results (from the past 2160 hour(s))  HM DIABETES EYE EXAM     Status: None   Collection Time: 02/18/16 12:00 AM  Result Value Ref Range   HM Diabetic Eye Exam No Retinopathy No Retinopathy    Comment: seen by Dr. Katy Apo  HM DIABETES EYE EXAM     Status: None   Collection Time: 02/18/16 12:00 AM  Result Value Ref Range   HM Diabetic Eye Exam  No Retinopathy  Glucose, capillary     Status: Abnormal   Collection Time: 04/11/16 10:36 AM  Result Value Ref Range   Glucose-Capillary 151 (H) 65 - 99 mg/dL  POCT HgB A1C (CPT 83036)     Status: None   Collection Time: 04/11/16 10:45 AM  Result Value Ref Range   Hemoglobin A1C 7.1     General Appearance: alert, oriented, no acute distress and well nourished  Musculoskeletal: Strength & Muscle Tone: within normal limits Gait & Station: normal Patient leans: N/A   Mental status examination Patient is casually dressed and groomed. He is cooperative, pleasant and maintained good eye contact. His speech is slow, soft and at times rambling.  His attention consultation is fair.  He described his mood neutral and his affect is appropriate.  His thought process is slow and circumstantial.  He denies any paranoia, delusion, obsessive thoughts this time.  His psychomotor activity is slow.  His fund of knowledge is average.  He is alert and oriented x3.  His insight judgment and impulse control is okay.  Established Problem, Stable/Improving (1), Review or order clinical lab tests (1), Review of Last Therapy Session (1) and Review of Medication Regimen & Side Effects  (2)  Assessment: Axis I: Chronic schizophrenia paranoid type  Axis II: Deferred  Axis III:  Patient Active Problem List  Diagnosis  . Diabetes mellitus without complication ([place])  . Hyperlipidemia  . Paranoid schizophrenia, subchronic condition ([place])  . TOBACCO ABUSE  . Essential hypertension  . MICROCYTOSIS  . BPH associated with nocturia  . ONYCHOMYCOSIS, TOENAILS  . Lumbar arthropathy ([place])  . Health maintenance examination    Plan:  Patient is a stable on his current psychiatric medication.  His creatinine dropped to 1.28 from 1.38.  His hemoglobin A1c also improved from 7.9 to 7.1.  I will continue present can overturn 7.5 mg every 2 weeks and hydroxyzine 10 mg at bedtime.  Discussed medication side effects and benefits.  Recommended to call us back if he is any question, concern if he feel worsening of the symptom.  Follow-up in 3 months.  Discuss safety plan that anytime having active suicidal thoughts or homicidal thoughts and he need to call 911 or go to the local emergency room.  Dahmir Epperly T., MD 04/27/2016  Patient ID: Kimberley Cohee, male   DOB: [birth year], 60 y.o.   MRN: [id number]

## 2016-05-04 ENCOUNTER — Encounter (HOSPITAL_COMMUNITY): Payer: Self-pay

## 2016-05-04 ENCOUNTER — Ambulatory Visit (INDEPENDENT_AMBULATORY_CARE_PROVIDER_SITE_OTHER): Payer: 59

## 2016-05-04 DIAGNOSIS — F200 Paranoid schizophrenia: Secondary | ICD-10-CM

## 2016-05-04 NOTE — Progress Notes (Signed)
    Patient presents today with appropriate affect for his injection of Fluphenazine 37.5 mg. Patient reports that he is eating and sleeping well, no SI/HI and no visual or audio hallucinations. Patient states that the pain he was having in his arms and hands has done away. The injection of Fluphenazine 37.5 mg was prepared as ordered and 1.5 mL wasadministered IM in patients leftdeltoid. Patient tolerated the procedure well and without complaint. Patient has agreed to come back in 2 weeks and will call with any questions or problems

## 2016-05-08 ENCOUNTER — Telehealth: Payer: Self-pay

## 2016-05-09 ENCOUNTER — Encounter: Payer: Self-pay | Admitting: Internal Medicine

## 2016-05-09 ENCOUNTER — Other Ambulatory Visit: Payer: Self-pay | Admitting: *Deleted

## 2016-05-09 DIAGNOSIS — I10 Essential (primary) hypertension: Secondary | ICD-10-CM

## 2016-05-09 MED ORDER — LOSARTAN POTASSIUM 100 MG PO TABS: 100.0000 mg | ORAL_TABLET | Freq: Every day | ORAL | 1 refills | Status: DC

## 2016-05-14 NOTE — Telephone Encounter (Signed)
See call notes

## 2016-05-18 ENCOUNTER — Ambulatory Visit (INDEPENDENT_AMBULATORY_CARE_PROVIDER_SITE_OTHER): Payer: 59

## 2016-05-18 DIAGNOSIS — F200 Paranoid schizophrenia: Secondary | ICD-10-CM | POA: Diagnosis not present

## 2016-05-18 NOTE — Progress Notes (Signed)
Patient presents today with appropriate affect for his injection of Fluphenazine 37.5 mg. Patient reports that he is eating and sleeping well, no SI/HI and no visual or audio hallucinations. The injection of Fluphenazine 37.5 mg was prepared as ordered and 1.5 mL wasadministered IM in patients rightdeltoid. Patient tolerated the procedure well and without complaint. Patient has agreed to come back in 2 weeks and will call with any questions or problems

## 2016-05-20 ENCOUNTER — Other Ambulatory Visit: Payer: Self-pay | Admitting: Internal Medicine

## 2016-05-20 DIAGNOSIS — E7849 Other hyperlipidemia: Secondary | ICD-10-CM

## 2016-06-01 ENCOUNTER — Ambulatory Visit (INDEPENDENT_AMBULATORY_CARE_PROVIDER_SITE_OTHER): Payer: 59

## 2016-06-01 ENCOUNTER — Encounter (HOSPITAL_COMMUNITY): Payer: Self-pay

## 2016-06-01 VITALS — BP 140/98 | HR 83 | Ht 72.5 in | Wt 230.0 lb

## 2016-06-01 DIAGNOSIS — F200 Paranoid schizophrenia: Secondary | ICD-10-CM

## 2016-06-01 MED ORDER — FLUPHENAZINE DECANOATE 25 MG/ML IJ SOLN: 37.5000 mg | INTRAMUSCULAR | 0 refills | Status: DC

## 2016-06-01 NOTE — Progress Notes (Signed)
Patient presented with flat affect, level mood and denied any auditory or visual hallucinations.  Patient denied any suicidal or homicidal ideations and reported no problems finding new outpatient office.  Patient with high blood pressure today, remaining around 140/98.  Patient states he had just taken his blood pressure medication this morning prior to coming in.  Informed Dr. Adele Schilder who provided a new Prolixin Decanoate 37.5 mg IM injection orders every 2 weeks.  New prescription e-scribed to patient's pharmacy to bring in next visit and patient agreed to continue to monitor blood pressure and to let his primary care provider know what it is running this date.  Patient denies any shortness of breath, no chest or any type of discomfort and states not flush, with headaches or any other outward signs of higher BP.  Patient's due Prolixin Decanote 37.5 mg IM injection prepared as ordered and given to patient in his right deltoid area.  Patient tolerated injection without complaint of pain or discomfort and will return for next injection in 2 weeks and will bring in new vial of medication for next appointment.

## 2016-06-15 ENCOUNTER — Ambulatory Visit (INDEPENDENT_AMBULATORY_CARE_PROVIDER_SITE_OTHER): Payer: 59

## 2016-06-15 DIAGNOSIS — F200 Paranoid schizophrenia: Secondary | ICD-10-CM | POA: Diagnosis not present

## 2016-06-15 NOTE — Progress Notes (Signed)
Patient presented with flat affect, level mood and denied any auditory or visual hallucinations.  Patient denied any suicidal or homicidal ideations. Patient reports he is eating and sleeping well.  Patient's due Prolixin Decanote 37.5 mg IM injection prepared as ordered and given to patient in his left deltoid area.  Patient tolerated injection without complaint of pain or discomfort and will return for next injection in 2 weeks .

## 2016-06-29 ENCOUNTER — Ambulatory Visit (INDEPENDENT_AMBULATORY_CARE_PROVIDER_SITE_OTHER): Payer: 59

## 2016-06-29 DIAGNOSIS — F200 Paranoid schizophrenia: Secondary | ICD-10-CM | POA: Diagnosis not present

## 2016-06-29 NOTE — Progress Notes (Signed)
Patient presented with appropriate affect, level mood and denied any auditory or visual hallucinations. Patient denied any suicidal or homicidal ideations. Patient reports he is eating and sleeping well.  Patient's due Prolixin Decanote 37.5 mg IM injection prepared as ordered and given to patient in his right deltoid area. Patient tolerated injection without complaint of pain or discomfort and will return for next injection in 2 weeks .

## 2016-07-13 ENCOUNTER — Ambulatory Visit (INDEPENDENT_AMBULATORY_CARE_PROVIDER_SITE_OTHER): Payer: 59

## 2016-07-13 ENCOUNTER — Telehealth: Payer: Self-pay

## 2016-07-13 ENCOUNTER — Encounter (HOSPITAL_COMMUNITY): Payer: Self-pay

## 2016-07-13 VITALS — BP 146/96 | HR 90 | Ht 72.5 in | Wt 231.0 lb

## 2016-07-13 DIAGNOSIS — F200 Paranoid schizophrenia: Secondary | ICD-10-CM

## 2016-07-13 NOTE — Telephone Encounter (Signed)
Pt bp is high. Please call back.

## 2016-07-13 NOTE — Progress Notes (Signed)
Patient presented with flat affect, level mood and denied any auditory or visual hallucinations, no suicidal or homicidal ideations and no complaints about current medication.  Patient's blood pressure was again up today at 146/96 and patient agreed for this nurse to contact his internal medicine provider with report as patient states plan to see Dr. Dareen Piano again on 07/18/16.   Called and spoke with receptionist, May today who offered for patient to be seen today but patient denied need and stated he wanted to keep appointment on 07/18/16.  Stated he had just taken blood pressure medication prior to coming into the behavioral health office this date and that it had not had time to start working yet.  Patient denied any dizziness, headaches or other symptoms related to elevated BPs.  Patient agreed to seek out emergency assistance if any cardiac or stroke like symptoms as these were reviewed.  Patients due Prolixin Decanoate 37.5 mg IM injection every 2 weeks prepared as ordered and given to patient in his left deltoid area.  Patient tolerated injection without complaint of pain or discomfort and reported he did not "even fill it".  Patient agreed to make sure to keep appointment with Dr. Dareen Piano for 07/18/16 to follow up on BP concerns and to call if any problems prior to next evaluation.  Patient stable at this time with reported no active psychiatric symptoms.

## 2016-07-18 ENCOUNTER — Ambulatory Visit: Payer: Self-pay | Admitting: Internal Medicine

## 2016-07-18 NOTE — Telephone Encounter (Signed)
Spoke to pt today, he states his BP is always up at psychiatrist office because he gets nervous, he will call back if he wants to be seen before his currently scheduled appt in march

## 2016-07-27 ENCOUNTER — Ambulatory Visit (INDEPENDENT_AMBULATORY_CARE_PROVIDER_SITE_OTHER): Payer: 59

## 2016-07-27 ENCOUNTER — Encounter (HOSPITAL_COMMUNITY): Payer: Self-pay

## 2016-07-27 DIAGNOSIS — F200 Paranoid schizophrenia: Secondary | ICD-10-CM

## 2016-07-27 MED ORDER — FLUPHENAZINE DECANOATE 25 MG/ML IJ SOLN
37.5000 mg | INTRAMUSCULAR | 0 refills | Status: DC
Start: 2016-07-27 — End: 2016-07-28

## 2016-07-27 NOTE — Progress Notes (Signed)
Patient presented with flat affect, level mood and denied any auditory or visual hallucinations, no suicidal or homicidal ideations and no complaints about current medication.  Patient's blood pressure was again up today at 150/82.  Patients due Prolixin Decanoate 37.5 mg IM injection every 2 weeks prepared as ordered and given to patient in his left deltoid area.  Patient tolerated injection without complaint of pain or discomfort. Patient agreed to call if any problems prior to next evaluation.  Patient stable at this time with reported no active psychiatric symptoms.

## 2016-07-28 ENCOUNTER — Other Ambulatory Visit (HOSPITAL_COMMUNITY): Payer: Self-pay

## 2016-07-28 DIAGNOSIS — F200 Paranoid schizophrenia: Secondary | ICD-10-CM

## 2016-07-28 MED ORDER — FLUPHENAZINE DECANOATE 25 MG/ML IJ SOLN: 37.5000 mg | INTRAMUSCULAR | 2 refills | Status: DC

## 2016-08-01 ENCOUNTER — Ambulatory Visit (INDEPENDENT_AMBULATORY_CARE_PROVIDER_SITE_OTHER): Payer: 59 | Admitting: Psychiatry

## 2016-08-01 ENCOUNTER — Encounter (HOSPITAL_COMMUNITY): Payer: Self-pay | Admitting: Psychiatry

## 2016-08-01 VITALS — BP 140/80 | HR 90 | Ht 72.5 in | Wt 233.0 lb

## 2016-08-01 DIAGNOSIS — Z833 Family history of diabetes mellitus: Secondary | ICD-10-CM | POA: Diagnosis not present

## 2016-08-01 DIAGNOSIS — Z813 Family history of other psychoactive substance abuse and dependence: Secondary | ICD-10-CM

## 2016-08-01 DIAGNOSIS — Z8489 Family history of other specified conditions: Secondary | ICD-10-CM

## 2016-08-01 DIAGNOSIS — F17210 Nicotine dependence, cigarettes, uncomplicated: Secondary | ICD-10-CM

## 2016-08-01 DIAGNOSIS — Z9889 Other specified postprocedural states: Secondary | ICD-10-CM | POA: Diagnosis not present

## 2016-08-01 DIAGNOSIS — F200 Paranoid schizophrenia: Secondary | ICD-10-CM

## 2016-08-01 DIAGNOSIS — Z8249 Family history of ischemic heart disease and other diseases of the circulatory system: Secondary | ICD-10-CM

## 2016-08-01 DIAGNOSIS — Z888 Allergy status to other drugs, medicaments and biological substances status: Secondary | ICD-10-CM

## 2016-08-01 DIAGNOSIS — Z79899 Other long term (current) drug therapy: Secondary | ICD-10-CM

## 2016-08-01 MED ORDER — HYDROXYZINE HCL 10 MG PO TABS: 10.0000 mg | ORAL_TABLET | Freq: Every day | ORAL | 2 refills | Status: DC

## 2016-08-01 MED ORDER — FLUPHENAZINE DECANOATE 25 MG/ML IJ SOLN: 37.5000 mg | INTRAMUSCULAR | 2 refills | Status: DC

## 2016-08-01 NOTE — Progress Notes (Signed)
BH MD/PA/NP OP Progress Note  08/01/2016 10:09 AM Wayne Schmidt  MRN:  [id number]  Chief Complaint:  Subjective:  I'm doing good.  My Christmas was quite.  HPI: Wayne Schmidt came for his follow-up appointment.  His getting Prolixin injection every 14 days.  He denies any hallucination, paranoia or any irritability.  He sleeping good.  He has mild tremors but he does not want to take Cogentin.  Since started low-dose hydroxyzine he sleeping very good.  He endorse Christmas was quiet and he did go to family friend and enjoyed the food.  Patient denies any hopelessness or worthlessness.  He denies any suicidal thoughts.  He denies any agitation or any anger.  He denies drinking alcohol or using any illegal substances.  He continues to enjoy company with his grandkids.  He is scheduled to see his primary care physician on March 6.  He admitted lately not checking his blood sugar because he is out of strips.  But he also endorse watching his calorie intake.  His appetite is okay.  His vital signs are stable.  Visit Diagnosis:    ICD-9-CM ICD-10-CM   1. Paranoid schizophrenia, subchronic condition ([place]) 295.31 F20.0     Past Psychiatric History: Reviewed.  Past Medical History:  Past Medical History:  Diagnosis Date  . High risk sexual behavior 10/2007  . Hypertension   . Microcytosis   . Schizophrenia ([place])   . Substance abuse    tobacco use    Past Surgical History:  Procedure Laterality Date  . LAMINECTOMY     by Dr Rolin Barry    Family Psychiatric History: Reviewed.  Family History:  Family History  Problem Relation Age of Onset  . Diabetes Mother   . Hypertension Mother   . Lupus Sister   . Sickle cell anemia Brother   . Drug abuse Brother     Social History:  Social History   Social History  . Marital status: Legally Separated    Spouse name: N/A  . Number of children: N/A  . Years of education: N/A   Social History Main Topics  . Smoking status: Current Every Day Smoker     Packs/day: 1.00    Years: 38.00    Types: Cigarettes  . Smokeless tobacco: Never Used     Comment: cutting back.  Has tried  . Alcohol use 1.2 oz/week    2 Cans of beer per week     Comment: Reports drinks 2 twelve ounce beers a week.   . Drug use: No     Comment: h/o remote cocaine use-denies present use  . Sexual activity: Not Currently   Other Topics Concern  . Not on file   Social History Narrative   Patient given diabetes card 07/07/2010    Allergies:  Allergies  Allergen Reactions  . Ace Inhibitors Cough  . Atorvastatin   . Chlorpromazine Hcl   . Simvastatin     Metabolic Disorder Labs: Lab Results  Component Value Date   HGBA1C 7.1 04/11/2016   No results found for: PROLACTIN Lab Results  Component Value Date   CHOL 166 01/11/2016   TRIG 55 01/11/2016   HDL 68 01/11/2016   CHOLHDL 2.4 01/11/2016   VLDL 11 04/01/2014   LDLCALC 87 01/11/2016   LDLCALC 91 04/01/2014     Current Medications: Current Outpatient Prescriptions  Medication Sig Dispense Refill  . ciclopirox (LOPROX) 0.77 % SUSP     . doxazosin (CARDURA) 2 MG tablet Take 2 mg by  mouth.  3  . fluPHENAZine decanoate (PROLIXIN) 25 MG/ML injection Inject 1.5 mLs (37.5 mg total) into the muscle every 14 (fourteen) days. 5 mL 2  . hydrOXYzine (ATARAX/VISTARIL) 10 MG tablet Take 1 tablet (10 mg total) by mouth at bedtime. 30 tablet 2  . ibuprofen (ADVIL,MOTRIN) 800 MG tablet Take 1 tablet (800 mg total) by mouth every 8 (eight) hours as needed for moderate pain. 90 tablet 0  . losartan (COZAAR) 100 MG tablet Take 1 tablet (100 mg total) by mouth daily. 90 tablet 1  . pravastatin (PRAVACHOL) 40 MG tablet Take 1 tablet (40 mg total) by mouth daily. 90 tablet 3   Current Facility-Administered Medications  Medication Dose Route Frequency Provider Last Rate Last Dose  . fluPHENAZine decanoate (PROLIXIN) injection 37.5 mg  37.5 mg Intramuscular Q14 Days Hampton Abbot, MD   37.5 mg at 07/27/16 1006     Neurologic: Headache: No Seizure: No Paresthesias: No  Musculoskeletal: Strength & Muscle Tone: within normal limits Gait & Station: normal Patient leans: N/A  Psychiatric Specialty Exam: Review of Systems  Constitutional: Negative.   HENT: Negative.   Eyes: Negative.   Respiratory: Negative.   Cardiovascular: Negative.   Gastrointestinal: Negative.   Genitourinary: Negative.   Skin: Negative.   Neurological: Positive for tremors.    Blood pressure 140/80, pulse 90, height 6' 0.5" (1.842 m), weight 233 lb (105.7 kg).Body mass index is 31.17 kg/m.  General Appearance: Casual  Eye Contact:  Fair  Speech:  Slow  Volume:  Decreased  Mood:  Euthymic  Affect:  Restricted  Thought Process:  Goal Directed  Orientation:  Full (Time, Place, and Person)  Thought Content: WDL and Logical   Suicidal Thoughts:  No  Homicidal Thoughts:  No  Memory:  Immediate;   Fair Recent;   Fair Remote;   Fair  Judgement:  Good  Insight:  Good  Psychomotor Activity:  Tremor  Concentration:  Concentration: Fair and Attention Span: Fair  Recall:  [employer] of Knowledge: Good  Language: Good  Akathisia:  No  Handed:  Right  AIMS (if indicated):  0  Assets:  Communication Skills Desire for Improvement Housing Resilience  ADL's:  Intact  Cognition: WNL  Sleep:  good   Assessment: Paranoid schizophrenia chronic  Plan: Patient is doing better on his Prolixin injection.  He has mild tremors but he does not want to take Cogentin.  I will continue Prolixin Decanoate 37.5 mg every 2 weeks and hydroxyzine and milligram at bedtime.  Reinforce to keep appointment with his primary care physician on March 6.  Recommended to call us back if he has any question, concern.  Feels worsening of the symptom.  Follow-up in 3 months.    Estee Yohe T., MD 08/01/2016, 10:09 AM

## 2016-08-10 ENCOUNTER — Ambulatory Visit (INDEPENDENT_AMBULATORY_CARE_PROVIDER_SITE_OTHER): Payer: 59

## 2016-08-10 ENCOUNTER — Other Ambulatory Visit (HOSPITAL_COMMUNITY): Payer: Self-pay | Admitting: Psychiatry

## 2016-08-10 ENCOUNTER — Telehealth (HOSPITAL_COMMUNITY): Payer: Self-pay

## 2016-08-10 ENCOUNTER — Encounter (HOSPITAL_COMMUNITY): Payer: Self-pay

## 2016-08-10 DIAGNOSIS — F200 Paranoid schizophrenia: Secondary | ICD-10-CM

## 2016-08-10 MED ORDER — HYDROXYZINE HCL 10 MG PO TABS: 10.0000 mg | ORAL_TABLET | Freq: Every day | ORAL | 0 refills | Status: DC

## 2016-08-10 MED ORDER — FLUPHENAZINE HCL 2.5 MG/ML IJ SOLN
37.5000 mg | INTRAMUSCULAR | Status: DC
  Administered 2016-08-10 – 2016-09-07 (×2): 37.5 mg via INTRAMUSCULAR

## 2016-08-10 NOTE — Telephone Encounter (Signed)
Medication refill request - Fax received from [facility], store # [phone] for Hydroxyzine 10mg , one a day at bedtime, #90.  Requests 90 day order.  A new 30 day order + 2 refills was e-scribed 08/01/16.

## 2016-08-10 NOTE — Progress Notes (Signed)
Patient presented with flat affect, level mood and denied any auditory or visual hallucinations, no suicidal or homicidal ideations and no complaints about current medication.Patients due Prolixin Decanoate 37.5 mg IM injection every 2 weeks prepared as ordered and given to patient in his right deltoid area. Patient tolerated injection without complaint of pain or discomfort.Patient agreed to call if any problems prior to next evaluation. Patient stable at this time with reported no active psychiatric symptoms.

## 2016-08-11 ENCOUNTER — Telehealth: Payer: Self-pay | Admitting: *Deleted

## 2016-08-11 MED ORDER — DOXAZOSIN MESYLATE 2 MG PO TABS: 2.0000 mg | ORAL_TABLET | Freq: Every day | ORAL | 1 refills | Status: DC

## 2016-08-15 NOTE — Telephone Encounter (Signed)
Received refill request for Doxazosin Mesylate 4mg  tabs-previous refill request was for 2mg  tabs.  I contacted pt to confirm dose and he informs me that he is currently taking 4mg  tabs and has been since last visit with pcp in 04/2016. Will send info to pcp for review and to send in new rx for 4mg  if appropriate.  Please advise.Despina Hidden Cassady2/13/20188:52 AM

## 2016-08-16 MED ORDER — DOXAZOSIN MESYLATE 4 MG PO TABS: 4.0000 mg | ORAL_TABLET | Freq: Every day | ORAL | 0 refills | Status: DC

## 2016-08-16 NOTE — Telephone Encounter (Signed)
I put in a refill for 4 mg of doxazosin. Thank you

## 2016-08-22 ENCOUNTER — Other Ambulatory Visit (HOSPITAL_COMMUNITY): Payer: Self-pay | Admitting: Psychiatry

## 2016-08-22 DIAGNOSIS — F200 Paranoid schizophrenia: Secondary | ICD-10-CM

## 2016-08-23 ENCOUNTER — Encounter (HOSPITAL_COMMUNITY): Payer: Self-pay | Admitting: Emergency Medicine

## 2016-08-23 ENCOUNTER — Encounter (HOSPITAL_COMMUNITY): Payer: Self-pay

## 2016-08-23 ENCOUNTER — Ambulatory Visit (INDEPENDENT_AMBULATORY_CARE_PROVIDER_SITE_OTHER)
Admission: EM | Admit: 2016-08-23 | Discharge: 2016-08-23 | Disposition: A | Discharge status: Home / Self Care | Payer: Medicare Other | Source: Home / Self Care | Attending: Family Medicine | Admitting: Family Medicine

## 2016-08-23 ENCOUNTER — Inpatient Hospital Stay (HOSPITAL_COMMUNITY)
Admission: EM | Admit: 2016-08-23 | Discharge: 2016-08-26 | DRG: 690 | Disposition: A | Discharge status: Home / Self Care | Payer: Medicare Other | Attending: Internal Medicine | Admitting: Internal Medicine

## 2016-08-23 ENCOUNTER — Ambulatory Visit (INDEPENDENT_AMBULATORY_CARE_PROVIDER_SITE_OTHER): Payer: Medicare Other

## 2016-08-23 DIAGNOSIS — Z832 Family history of diseases of the blood and blood-forming organs and certain disorders involving the immune mechanism: Secondary | ICD-10-CM | POA: Diagnosis not present

## 2016-08-23 DIAGNOSIS — Z8619 Personal history of other infectious and parasitic diseases: Secondary | ICD-10-CM | POA: Diagnosis not present

## 2016-08-23 DIAGNOSIS — R509 Fever, unspecified: Secondary | ICD-10-CM

## 2016-08-23 DIAGNOSIS — F17210 Nicotine dependence, cigarettes, uncomplicated: Secondary | ICD-10-CM | POA: Diagnosis not present

## 2016-08-23 DIAGNOSIS — R197 Diarrhea, unspecified: Secondary | ICD-10-CM | POA: Insufficient documentation

## 2016-08-23 DIAGNOSIS — E785 Hyperlipidemia, unspecified: Secondary | ICD-10-CM | POA: Diagnosis present

## 2016-08-23 DIAGNOSIS — I10 Essential (primary) hypertension: Secondary | ICD-10-CM

## 2016-08-23 DIAGNOSIS — B9620 Unspecified Escherichia coli [E. coli] as the cause of diseases classified elsewhere: Secondary | ICD-10-CM | POA: Diagnosis not present

## 2016-08-23 DIAGNOSIS — Z8249 Family history of ischemic heart disease and other diseases of the circulatory system: Secondary | ICD-10-CM

## 2016-08-23 DIAGNOSIS — Z79899 Other long term (current) drug therapy: Secondary | ICD-10-CM | POA: Diagnosis not present

## 2016-08-23 DIAGNOSIS — R351 Nocturia: Secondary | ICD-10-CM | POA: Diagnosis not present

## 2016-08-23 DIAGNOSIS — N390 Urinary tract infection, site not specified: Secondary | ICD-10-CM | POA: Diagnosis not present

## 2016-08-23 DIAGNOSIS — E860 Dehydration: Secondary | ICD-10-CM

## 2016-08-23 DIAGNOSIS — R42 Dizziness and giddiness: Secondary | ICD-10-CM

## 2016-08-23 DIAGNOSIS — F209 Schizophrenia, unspecified: Secondary | ICD-10-CM | POA: Insufficient documentation

## 2016-08-23 DIAGNOSIS — F200 Paranoid schizophrenia: Secondary | ICD-10-CM

## 2016-08-23 DIAGNOSIS — R0989 Other specified symptoms and signs involving the circulatory and respiratory systems: Secondary | ICD-10-CM | POA: Insufficient documentation

## 2016-08-23 DIAGNOSIS — R600 Localized edema: Secondary | ICD-10-CM

## 2016-08-23 DIAGNOSIS — Z7251 High risk heterosexual behavior: Secondary | ICD-10-CM | POA: Insufficient documentation

## 2016-08-23 DIAGNOSIS — G8929 Other chronic pain: Secondary | ICD-10-CM | POA: Diagnosis not present

## 2016-08-23 DIAGNOSIS — J189 Pneumonia, unspecified organism: Secondary | ICD-10-CM

## 2016-08-23 DIAGNOSIS — N400 Enlarged prostate without lower urinary tract symptoms: Secondary | ICD-10-CM | POA: Diagnosis not present

## 2016-08-23 DIAGNOSIS — R300 Dysuria: Secondary | ICD-10-CM

## 2016-08-23 DIAGNOSIS — R0602 Shortness of breath: Secondary | ICD-10-CM

## 2016-08-23 DIAGNOSIS — N401 Enlarged prostate with lower urinary tract symptoms: Secondary | ICD-10-CM | POA: Diagnosis not present

## 2016-08-23 DIAGNOSIS — R3911 Hesitancy of micturition: Secondary | ICD-10-CM | POA: Insufficient documentation

## 2016-08-23 DIAGNOSIS — R350 Frequency of micturition: Secondary | ICD-10-CM

## 2016-08-23 DIAGNOSIS — Z833 Family history of diabetes mellitus: Secondary | ICD-10-CM

## 2016-08-23 DIAGNOSIS — F1910 Other psychoactive substance abuse, uncomplicated: Secondary | ICD-10-CM

## 2016-08-23 DIAGNOSIS — E119 Type 2 diabetes mellitus without complications: Secondary | ICD-10-CM | POA: Diagnosis not present

## 2016-08-23 DIAGNOSIS — R369 Urethral discharge, unspecified: Secondary | ICD-10-CM

## 2016-08-23 DIAGNOSIS — R918 Other nonspecific abnormal finding of lung field: Secondary | ICD-10-CM | POA: Diagnosis not present

## 2016-08-23 DIAGNOSIS — N3001 Acute cystitis with hematuria: Secondary | ICD-10-CM | POA: Diagnosis not present

## 2016-08-23 DIAGNOSIS — R05 Cough: Secondary | ICD-10-CM | POA: Diagnosis not present

## 2016-08-23 DIAGNOSIS — B9689 Other specified bacterial agents as the cause of diseases classified elsewhere: Secondary | ICD-10-CM | POA: Diagnosis not present

## 2016-08-23 HISTORY — DX: Type 2 diabetes mellitus without complications: E11.9

## 2016-08-23 LAB — CBC WITH DIFFERENTIAL/PLATELET
Basophils Absolute: 0 10*3/uL (ref 0.0–0.1)
Basophils Relative: 0 %
Eosinophils Absolute: 0 10*3/uL (ref 0.0–0.7)
Eosinophils Relative: 0 %
HEMATOCRIT: 45.1 % (ref 39.0–52.0)
Hemoglobin: 15.1 g/dL (ref 13.0–17.0)
LYMPHS ABS: 4.6 10*3/uL — AB (ref 0.7–4.0)
LYMPHS PCT: 28 %
MCH: 24.7 pg — AB (ref 26.0–34.0)
MCHC: 33.5 g/dL (ref 30.0–36.0)
MCV: 73.7 fL — AB (ref 78.0–100.0)
MONO ABS: 1.2 10*3/uL — AB (ref 0.1–1.0)
MONOS PCT: 7 %
NEUTROS ABS: 10.6 10*3/uL — AB (ref 1.7–7.7)
Neutrophils Relative %: 65 %
Platelets: 166 10*3/uL (ref 150–400)
RBC: 6.12 MIL/uL — ABNORMAL HIGH (ref 4.22–5.81)
RDW: 15.8 % — AB (ref 11.5–15.5)
WBC: 16.5 10*3/uL — ABNORMAL HIGH (ref 4.0–10.5)

## 2016-08-23 LAB — COMPREHENSIVE METABOLIC PANEL
ALBUMIN: 3.9 g/dL (ref 3.5–5.0)
ALT: 14 U/L — ABNORMAL LOW (ref 17–63)
ANION GAP: 6 (ref 5–15)
AST: 20 U/L (ref 15–41)
Alkaline Phosphatase: 62 U/L (ref 38–126)
BUN: 12 mg/dL (ref 6–20)
CHLORIDE: 103 mmol/L (ref 101–111)
CO2: 27 mmol/L (ref 22–32)
Calcium: 9.2 mg/dL (ref 8.9–10.3)
Creatinine, Ser: 1.37 mg/dL — ABNORMAL HIGH (ref 0.61–1.24)
GFR calc Af Amer: >60 mL/min (ref >60)
GFR calc non Af Amer: 54 mL/min — ABNORMAL LOW (ref >60)
GLUCOSE: 137 mg/dL — AB (ref 65–99)
POTASSIUM: 4.2 mmol/L (ref 3.5–5.1)
SODIUM: 136 mmol/L (ref 135–145)
Total Bilirubin: 0.6 mg/dL (ref 0.3–1.2)
Total Protein: 7.4 g/dL (ref 6.5–8.1)

## 2016-08-23 LAB — POCT URINALYSIS DIP (DEVICE)
Bilirubin Urine: NEGATIVE
GLUCOSE, UA: NEGATIVE mg/dL
Ketones, ur: NEGATIVE mg/dL
Nitrite: NEGATIVE
PROTEIN: NEGATIVE mg/dL
Specific Gravity, Urine: 1.01 (ref 1.005–1.030)
UROBILINOGEN UA: 0.2 mg/dL (ref 0.0–1.0)
pH: 5.5 (ref 5.0–8.0)

## 2016-08-23 LAB — BRAIN NATRIURETIC PEPTIDE: B Natriuretic Peptide: 30.9 pg/mL (ref 0.0–100.0)

## 2016-08-23 LAB — I-STAT CG4 LACTIC ACID, ED: Lactic Acid, Venous: 1.05 mmol/L (ref 0.5–1.9)

## 2016-08-23 MED ORDER — DEXTROSE 5 % IV SOLN
500.0000 mg | Freq: Once | INTRAVENOUS | Status: AC
  Administered 2016-08-24: 500 mg via INTRAVENOUS
  Filled 2016-08-23: qty 500

## 2016-08-23 MED ORDER — DEXTROSE 5 % IV SOLN
1.0000 g | Freq: Once | INTRAVENOUS | Status: AC
  Administered 2016-08-24: 1 g via INTRAVENOUS
  Filled 2016-08-23: qty 10

## 2016-08-23 MED ORDER — SODIUM CHLORIDE 0.9 % IV BOLUS (SEPSIS)
1000.0000 mL | Freq: Once | INTRAVENOUS | Status: AC
  Administered 2016-08-23: 1000 mL via INTRAVENOUS

## 2016-08-23 MED ORDER — FENTANYL CITRATE (PF) 100 MCG/2ML IJ SOLN
50.0000 ug | Freq: Once | INTRAMUSCULAR | Status: AC
  Administered 2016-08-24: 50 ug via INTRAVENOUS
  Filled 2016-08-23: qty 2

## 2016-08-23 MED ORDER — IPRATROPIUM-ALBUTEROL 0.5-2.5 (3) MG/3ML IN SOLN
3.0000 mL | Freq: Once | RESPIRATORY_TRACT | Status: AC
  Administered 2016-08-24: 3 mL via RESPIRATORY_TRACT
  Filled 2016-08-23: qty 3

## 2016-08-23 NOTE — ED Provider Notes (Signed)
CSN: [id number]     Arrival date & time 08/23/16  1837 History   First MD Initiated Contact with Patient 08/23/16 2027     Chief Complaint  Patient presents with  . Diarrhea   (Consider location/radiation/quality/duration/timing/severity/associated sxs/prior Treatment) 61 year old male, very poor historian complaining of dizziness, shallow breathing, urinary frequency and initially complained of abdominal discomfort but later retracted that, urinary hesitancy, dysuria, urethral discharge and urinary frequency especially when he drinks something. All the symptoms started today. His current temperature is 100.1 degrees. Heart rate 106.      Past Medical History:  Diagnosis Date  . High risk sexual behavior 10/2007  . Hypertension   . Microcytosis   . Schizophrenia ([place])   . Substance abuse    tobacco use   Past Surgical History:  Procedure Laterality Date  . LAMINECTOMY     by Dr Rolin Barry   Family History  Problem Relation Age of Onset  . Diabetes Mother   . Hypertension Mother   . Lupus Sister   . Sickle cell anemia Brother   . Drug abuse Brother    Social History  Substance Use Topics  . Smoking status: Current Every Day Smoker    Packs/day: 1.00    Years: 38.00    Types: Cigarettes  . Smokeless tobacco: Never Used     Comment: cutting back.  Has tried  . Alcohol use 1.2 oz/week    2 Cans of beer per week     Comment: Reports drinks 2 twelve ounce beers a week.     Review of Systems  Constitutional: Positive for activity change, fatigue and fever.  HENT: Negative.   Respiratory: Positive for shortness of breath. Negative for cough and chest tightness.   Cardiovascular: Negative for chest pain and leg swelling.  Gastrointestinal: Negative for abdominal pain, constipation, diarrhea, nausea and vomiting.  Genitourinary: Positive for decreased urine volume, discharge, dysuria, frequency and urgency. Negative for scrotal swelling and testicular pain.  Musculoskeletal:  Negative.   Skin: Negative.   Neurological: Positive for dizziness. Negative for tremors, syncope, facial asymmetry, speech difficulty and headaches.  All other systems reviewed and are negative.   Allergies  Ace inhibitors; Atorvastatin; Chlorpromazine hcl; and Simvastatin  Home Medications   Prior to Admission medications   Medication Sig Start Date End Date Taking? Authorizing Provider  ciclopirox (LOPROX) 0.77 % SUSP  10/07/12   Historical Provider, MD  doxazosin (CARDURA) 4 MG tablet Take 1 tablet (4 mg total) by mouth daily. 08/16/16   Aldine Contes, MD  fluPHENAZine decanoate (PROLIXIN) 25 MG/ML injection Inject 1.5 mLs (37.5 mg total) into the muscle every 14 (fourteen) days. 08/01/16   [name], MD  hydrOXYzine (ATARAX/VISTARIL) 10 MG tablet Take 1 tablet (10 mg total) by mouth at bedtime. 08/10/16   [name], MD  ibuprofen (ADVIL,MOTRIN) 800 MG tablet Take 1 tablet (800 mg total) by mouth every 8 (eight) hours as needed for moderate pain. 07/15/14   Karlene Einstein, MD  losartan (COZAAR) 100 MG tablet Take 1 tablet (100 mg total) by mouth daily. 05/09/16   Nischal Narendra, MD  pravastatin (PRAVACHOL) 40 MG tablet Take 1 tablet (40 mg total) by mouth daily. 05/22/16   Oval Linsey, MD   Meds Ordered and Administered this Visit  Medications - No data to display  BP 139/77 (BP Location: Right Arm)   Pulse 106   Temp 100.1 F (37.8 C) (Oral)   Resp 18   SpO2 95%  No  data found.   Physical Exam  Constitutional: He is oriented to person, place, and time. He appears well-developed and well-nourished. No distress.  HENT:  Bilateral TMs are normal. Oropharynx with minor erythema but not well visualized due to tongue retraction.  Eyes: EOM are normal.  Neck: Normal range of motion. Neck supple.  Cardiovascular: Regular rhythm, normal heart sounds and intact distal pulses.   Regular rhythm at a rate of 106.  Pulmonary/Chest: Effort normal. No respiratory distress.   Diminished breath sounds bilaterally. Rare expiratory wheeze, crackles in the right base  Abdominal: Soft. Bowel sounds are normal. He exhibits no distension and no mass. There is no tenderness. There is no rebound and no guarding.  Musculoskeletal: He exhibits no edema.  Lymphadenopathy:    He has no cervical adenopathy.  Neurological: He is alert and oriented to person, place, and time. No cranial nerve deficit. He exhibits normal muscle tone.  Skin: Skin is warm and dry. No rash noted.  Psychiatric: He has a normal mood and affect.  Nursing note and vitals reviewed.   Urgent Care Course     Procedures (including critical care time)  Labs Review Labs Reviewed  POCT URINALYSIS DIP (DEVICE) - Abnormal; Notable for the following:       Result Value   Hgb urine dipstick SMALL (*)    Leukocytes, UA SMALL (*)    All other components within normal limits  URINE CYTOLOGY ANCILLARY ONLY   Results for orders placed or performed during the hospital encounter of 08/23/16  POCT urinalysis dip (device)  Result Value Ref Range   Glucose, UA NEGATIVE NEGATIVE mg/dL   Bilirubin Urine NEGATIVE NEGATIVE   Ketones, ur NEGATIVE NEGATIVE mg/dL   Specific Gravity, Urine 1.010 1.005 - 1.030   Hgb urine dipstick SMALL (A) NEGATIVE   pH 5.5 5.0 - 8.0   Protein, ur NEGATIVE NEGATIVE mg/dL   Urobilinogen, UA 0.2 0.0 - 1.0 mg/dL   Nitrite NEGATIVE NEGATIVE   Leukocytes, UA SMALL (A) NEGATIVE     Imaging Review Dg Chest 2 View  Result Date: 08/23/2016 CLINICAL DATA:  Acute onset of shortness of breath and right basilar crackles. Low-grade fever. Initial encounter. EXAM: CHEST  2 VIEW COMPARISON:  Chest radiograph performed 05/20/2014 FINDINGS: The lungs are well-aerated. Increased interstitial markings may reflect mild infection or possibly edema. There is no evidence of pleural effusion or pneumothorax. The heart is normal in size; the mediastinal contour is within normal limits. No acute osseous  abnormalities are seen. IMPRESSION: Increased interstitial markings may reflect mild infection or possibly interstitial edema. Electronically Signed   By: Garald Balding M.D.   On: 08/23/2016 21:01     Visual Acuity Review  Right Eye Distance:   Left Eye Distance:   Bilateral Distance:    Right Eye Near:   Left Eye Near:    Bilateral Near:         MDM   1. Dehydration   2. Shortness of breath   3. Community acquired pneumonia, unspecified laterality   4. Urinary frequency   5. Dysuria   6. Localized edema    61 year old male that smokes one pack per day presents to the urgent care with chief complaint of shortness of breath, fatigue and various urinary symptoms including dysuria, decreased urine flow, urinary frequency. Also complaining of dizziness particularly with standing and fever. Uncertain as to whether he has a urinary tract infection versus STD as well as interstitial edema on the x-ray and possible  pneumonia. Sent to ED for additional evaluation, possible IV fluids, possible antibiotics. Urine sent for cytology.    Janne Napoleon, NP 08/23/16 2125

## 2016-08-23 NOTE — ED Triage Notes (Signed)
Pt sent from [facility] for evaluation of pneumonia. Pt endorses shallow breathing and dizziness since this morning. VSS. Pt alert and oriented x 4.

## 2016-08-23 NOTE — ED Triage Notes (Signed)
The patient presented to the [facility] with a complaint of diarrhea that started today as well as urinary hesitancy.

## 2016-08-23 NOTE — ED Provider Notes (Signed)
By signing my name below, I, Avnee Patel, attest that this documentation has been prepared under the direction and in the presence of [place], DO  Electronically Signed: [place], ED Scribe. 08/23/16. 11:45 PM.  TIME SEEN: 11:14 PM  CHIEF COMPLAINT:  Chief Complaint  Patient presents with  . Cough  . Pneumonia    HPI:   Wayne Schmidt is a 61 y.o. male, with a PMHx of DM and HTN, who presents to the Emergency Department complaining of dizziness onset today. Pt also reports chills, fever, mild SOB, a fever, Generalized weakness and dysuria. Relative notes the pt was seen at Urgent Care, was evaluated for possible pneumonia and was told to visit the ED for further evaluation. No alleviating factors noted. Pt denies chest pain, hx of heart failure, vomiting, diarrhea, recent long distance travel, recent hospitalization or any other associated symptoms. Pt reports he did get his flu shot this year.  Does not wear oxygen at home.  [id number], NISCHAL, MD  ROS: See HPI Constitutional: + low-grade fever  Eyes: no drainage  ENT: no runny nose   Cardiovascular:  no chest pain  Resp: + SOB  GI: no vomiting GU: + dysuria Integumentary: no rash  Allergy: no hives  Musculoskeletal: no leg swelling  Neurological: no slurred speech ROS otherwise negative  PAST MEDICAL HISTORY/PAST SURGICAL HISTORY:  Past Medical History:  Diagnosis Date  . Diabetes mellitus without complication ([place])   . High risk sexual behavior 10/2007  . Hypertension   . Microcytosis   . Schizophrenia ([place])   . Substance abuse    tobacco use    MEDICATIONS:  Prior to Admission medications   Medication Sig Start Date End Date Taking? Authorizing Provider  ciclopirox (LOPROX) 0.77 % SUSP  10/07/12   Historical Provider, MD  doxazosin (CARDURA) 4 MG tablet Take 1 tablet (4 mg total) by mouth daily. 08/16/16   Aldine Contes, MD  fluPHENAZine decanoate (PROLIXIN) 25 MG/ML injection Inject 1.5 mLs (37.5  mg total) into the muscle every 14 (fourteen) days. 08/01/16   [name], MD  hydrOXYzine (ATARAX/VISTARIL) 10 MG tablet Take 1 tablet (10 mg total) by mouth at bedtime. 08/10/16   [name], MD  ibuprofen (ADVIL,MOTRIN) 800 MG tablet Take 1 tablet (800 mg total) by mouth every 8 (eight) hours as needed for moderate pain. 07/15/14   Karlene Einstein, MD  losartan (COZAAR) 100 MG tablet Take 1 tablet (100 mg total) by mouth daily. 05/09/16   Nischal Narendra, MD  pravastatin (PRAVACHOL) 40 MG tablet Take 1 tablet (40 mg total) by mouth daily. 05/22/16   Oval Linsey, MD    ALLERGIES:  Allergies  Allergen Reactions  . Ace Inhibitors Cough  . Atorvastatin   . Chlorpromazine Hcl   . Simvastatin     SOCIAL HISTORY:  Social History  Substance Use Topics  . Smoking status: Current Every Day Smoker    Packs/day: 1.00    Years: 38.00    Types: Cigarettes  . Smokeless tobacco: Never Used     Comment: cutting back.  Has tried  . Alcohol use 1.2 oz/week    2 Cans of beer per week     Comment: Reports drinks 2 twelve ounce beers a week.     FAMILY HISTORY: Family History  Problem Relation Age of Onset  . Diabetes Mother   . Hypertension Mother   . Lupus Sister   . Sickle cell anemia Brother   . Drug abuse  Brother     EXAM: BP 138/91 (BP Location: Left Arm)   Pulse 98   Temp 99.5 F (37.5 C) (Oral)   Resp 18   Ht 6' 0.5" (1.842 m)   Wt 233 lb (105.7 kg)   SpO2 96%   BMI 31.17 kg/m  CONSTITUTIONAL: Alert and oriented and responds appropriately to questions. Well-appearing; well-nourished HEAD: Normocephalic EYES: Conjunctivae clear, PERRL, EOMI ENT: normal nose; no rhinorrhea; moist mucous membranes NECK: Supple, no meningismus, no nuchal rigidity, no LAD, No JVD  CARD: RRR; S1 and S2 appreciated; no murmurs, no clicks, no rubs, no gallops RESP: Diffuse scattered expiratory wheezes. Tachypneic. Saturation drops to upper 80s with minimal exertion. Normal chest excursion  without splinting. No rhonchi, no rales, no hypoxia or respiratory distress, speaking full sentences ABD/GI: Normal bowel sounds; non-distended; soft, non-tender, no rebound, no guarding, no peritoneal signs, no hepatosplenomegaly BACK:  The back appears normal and is non-tender to palpation, there is no CVA tenderness EXT: Normal ROM in all joints; non-tender to palpation; no edema; normal capillary refill; no cyanosis, no calf tenderness or swelling    SKIN: Normal color for age and race; warm; no rash NEURO: Moves all extremities equally PSYCH: The patient's mood and manner are appropriate. Grooming and personal hygiene are appropriate.   MEDICAL DECISION MAKING: Patient here with fever, tachypnea, mild hypoxia with minimal exertion. Chest x-ray shows pneumonia. Less likely edema. No history of CHF and does not appear volume overloaded on exam. Will treat for community-acquired pneumonia. Also complaining of urinary symptoms. Urinalysis is pending. Will obtain lactate, blood cultures. We'll give IV fluids. Given he has some hypoxia with minimal exertion, I feel he will need admission. PCP is with [facility] internal medicine.  ED PROGRESS: Patient's labs show leukocytosis of 16,000. Normal lactate. Negative troponin. Normal BNP. Urine shows large leukocytes, too numerous to count white blood cells and rare bacteria. Culture is pending. He is receiving ceftriaxone which will cover potential UTI.  1:05 AM  D/w IM resident who will see patient in place admission orders. Patient and family have been updated with plan.   I reviewed all nursing notes, vitals, pertinent old records, EKGs, labs, imaging (as available).   EKG Interpretation  Date/Time:  Wednesday August 23 2016 23:19:23 EST Ventricular Rate:  97 PR Interval:    QRS Duration: 92 QT Interval:  337 QTC Calculation: 428 R Axis:   74 Text Interpretation:  Sinus rhythm RAE, consider biatrial enlargement Baseline wander in lead(s)  V5 V6 Confirmed by Tarini Carrier,  DO, Reade Trefz [id number]) on 08/23/2016 11:46:43 PM       I personally performed the services described in this documentation, which was scribed in my presence. The recorded information has been reviewed and is accurate.     [place], DO 08/24/16 0109

## 2016-08-23 NOTE — Discharge Instructions (Signed)
Go to the ED now 

## 2016-08-24 ENCOUNTER — Ambulatory Visit (HOSPITAL_COMMUNITY): Payer: Self-pay

## 2016-08-24 ENCOUNTER — Telehealth (HOSPITAL_COMMUNITY): Payer: Self-pay

## 2016-08-24 DIAGNOSIS — Z8249 Family history of ischemic heart disease and other diseases of the circulatory system: Secondary | ICD-10-CM

## 2016-08-24 DIAGNOSIS — Z832 Family history of diseases of the blood and blood-forming organs and certain disorders involving the immune mechanism: Secondary | ICD-10-CM | POA: Diagnosis not present

## 2016-08-24 DIAGNOSIS — E119 Type 2 diabetes mellitus without complications: Secondary | ICD-10-CM

## 2016-08-24 DIAGNOSIS — Z833 Family history of diabetes mellitus: Secondary | ICD-10-CM

## 2016-08-24 DIAGNOSIS — F200 Paranoid schizophrenia: Secondary | ICD-10-CM | POA: Diagnosis present

## 2016-08-24 DIAGNOSIS — B9689 Other specified bacterial agents as the cause of diseases classified elsewhere: Secondary | ICD-10-CM

## 2016-08-24 DIAGNOSIS — R351 Nocturia: Secondary | ICD-10-CM | POA: Diagnosis not present

## 2016-08-24 DIAGNOSIS — R0602 Shortness of breath: Secondary | ICD-10-CM | POA: Diagnosis not present

## 2016-08-24 DIAGNOSIS — R918 Other nonspecific abnormal finding of lung field: Secondary | ICD-10-CM | POA: Diagnosis not present

## 2016-08-24 DIAGNOSIS — B9620 Unspecified Escherichia coli [E. coli] as the cause of diseases classified elsewhere: Secondary | ICD-10-CM | POA: Diagnosis not present

## 2016-08-24 DIAGNOSIS — N401 Enlarged prostate with lower urinary tract symptoms: Secondary | ICD-10-CM

## 2016-08-24 DIAGNOSIS — I10 Essential (primary) hypertension: Secondary | ICD-10-CM

## 2016-08-24 DIAGNOSIS — Z79899 Other long term (current) drug therapy: Secondary | ICD-10-CM | POA: Diagnosis not present

## 2016-08-24 DIAGNOSIS — F17210 Nicotine dependence, cigarettes, uncomplicated: Secondary | ICD-10-CM

## 2016-08-24 DIAGNOSIS — E860 Dehydration: Secondary | ICD-10-CM | POA: Diagnosis present

## 2016-08-24 DIAGNOSIS — R350 Frequency of micturition: Secondary | ICD-10-CM

## 2016-08-24 DIAGNOSIS — Z888 Allergy status to other drugs, medicaments and biological substances status: Secondary | ICD-10-CM

## 2016-08-24 DIAGNOSIS — E785 Hyperlipidemia, unspecified: Secondary | ICD-10-CM | POA: Diagnosis present

## 2016-08-24 DIAGNOSIS — Z8619 Personal history of other infectious and parasitic diseases: Secondary | ICD-10-CM | POA: Diagnosis not present

## 2016-08-24 DIAGNOSIS — G8929 Other chronic pain: Secondary | ICD-10-CM | POA: Diagnosis present

## 2016-08-24 DIAGNOSIS — N390 Urinary tract infection, site not specified: Secondary | ICD-10-CM | POA: Diagnosis not present

## 2016-08-24 DIAGNOSIS — N400 Enlarged prostate without lower urinary tract symptoms: Secondary | ICD-10-CM | POA: Diagnosis present

## 2016-08-24 DIAGNOSIS — Z8269 Family history of other diseases of the musculoskeletal system and connective tissue: Secondary | ICD-10-CM

## 2016-08-24 DIAGNOSIS — R509 Fever, unspecified: Secondary | ICD-10-CM | POA: Diagnosis present

## 2016-08-24 DIAGNOSIS — Z813 Family history of other psychoactive substance abuse and dependence: Secondary | ICD-10-CM

## 2016-08-24 DIAGNOSIS — R05 Cough: Secondary | ICD-10-CM

## 2016-08-24 HISTORY — DX: Urinary tract infection, site not specified: N39.0

## 2016-08-24 LAB — GLUCOSE, CAPILLARY
GLUCOSE-CAPILLARY: 135 mg/dL — AB (ref 65–99)
GLUCOSE-CAPILLARY: 110 mg/dL — AB (ref 65–99)
GLUCOSE-CAPILLARY: 132 mg/dL — AB (ref 65–99)
Glucose-Capillary: 141 mg/dL — ABNORMAL HIGH (ref 65–99)

## 2016-08-24 LAB — CBC
HEMATOCRIT: 43.6 % (ref 39.0–52.0)
HEMOGLOBIN: 14.4 g/dL (ref 13.0–17.0)
MCH: 24.3 pg — ABNORMAL LOW (ref 26.0–34.0)
MCHC: 33 g/dL (ref 30.0–36.0)
MCV: 73.5 fL — AB (ref 78.0–100.0)
Platelets: 151 10*3/uL (ref 150–400)
RBC: 5.93 MIL/uL — ABNORMAL HIGH (ref 4.22–5.81)
RDW: 15.4 % (ref 11.5–15.5)
WBC: 16.4 10*3/uL — AB (ref 4.0–10.5)

## 2016-08-24 LAB — COMPREHENSIVE METABOLIC PANEL
ALBUMIN: 3.7 g/dL (ref 3.5–5.0)
ALK PHOS: 58 U/L (ref 38–126)
ALT: 13 U/L — ABNORMAL LOW (ref 17–63)
AST: 20 U/L (ref 15–41)
Anion gap: 13 (ref 5–15)
BILIRUBIN TOTAL: 0.7 mg/dL (ref 0.3–1.2)
BUN: 11 mg/dL (ref 6–20)
CALCIUM: 9 mg/dL (ref 8.9–10.3)
CO2: 23 mmol/L (ref 22–32)
CREATININE: 1.3 mg/dL — AB (ref 0.61–1.24)
Chloride: 100 mmol/L — ABNORMAL LOW (ref 101–111)
GFR calc Af Amer: >60 mL/min (ref >60)
GFR, EST NON AFRICAN AMERICAN: 58 mL/min — AB (ref >60)
GLUCOSE: 77 mg/dL (ref 65–99)
Potassium: 4.7 mmol/L (ref 3.5–5.1)
Sodium: 136 mmol/L (ref 135–145)
TOTAL PROTEIN: 7.2 g/dL (ref 6.5–8.1)

## 2016-08-24 LAB — URINALYSIS, ROUTINE W REFLEX MICROSCOPIC
Bilirubin Urine: NEGATIVE
GLUCOSE, UA: NEGATIVE mg/dL
Ketones, ur: NEGATIVE mg/dL
NITRITE: NEGATIVE
PH: 5 (ref 5.0–8.0)
Protein, ur: NEGATIVE mg/dL
Specific Gravity, Urine: 1.015 (ref 1.005–1.030)

## 2016-08-24 LAB — INFLUENZA PANEL BY PCR (TYPE A & B)
INFLAPCR: NEGATIVE
INFLBPCR: NEGATIVE

## 2016-08-24 LAB — I-STAT TROPONIN, ED: TROPONIN I, POC: 0 ng/mL (ref 0.00–0.08)

## 2016-08-24 LAB — HIV ANTIBODY (ROUTINE TESTING W REFLEX): HIV SCREEN 4TH GENERATION: NONREACTIVE

## 2016-08-24 LAB — I-STAT CG4 LACTIC ACID, ED: Lactic Acid, Venous: 0.62 mmol/L (ref 0.5–1.9)

## 2016-08-24 LAB — PROCALCITONIN: PROCALCITONIN: 0.1 ng/mL

## 2016-08-24 LAB — STREP PNEUMONIAE URINARY ANTIGEN: Strep Pneumo Urinary Antigen: NEGATIVE

## 2016-08-24 MED ORDER — HEPARIN SODIUM (PORCINE) 5000 UNIT/ML IJ SOLN
5000.0000 [IU] | Freq: Three times a day (TID) | INTRAMUSCULAR | Status: DC
  Administered 2016-08-24 – 2016-08-26 (×6): 5000 [IU] via SUBCUTANEOUS
  Filled 2016-08-24 (×6): qty 1

## 2016-08-24 MED ORDER — SODIUM CHLORIDE 0.9 % IV SOLN
INTRAVENOUS | Status: AC
  Administered 2016-08-24: 1000 mL via INTRAVENOUS

## 2016-08-24 MED ORDER — FLUPHENAZINE DECANOATE 25 MG/ML IJ SOLN
37.5000 mg | Freq: Once | INTRAMUSCULAR | Status: AC
  Administered 2016-08-24: 37.5 mg via INTRAMUSCULAR
  Filled 2016-08-24: qty 1.5

## 2016-08-24 MED ORDER — SODIUM CHLORIDE 0.9% FLUSH
3.0000 mL | Freq: Two times a day (BID) | INTRAVENOUS | Status: DC
  Administered 2016-08-24 – 2016-08-26 (×4): 3 mL via INTRAVENOUS

## 2016-08-24 MED ORDER — LOSARTAN POTASSIUM 50 MG PO TABS
100.0000 mg | ORAL_TABLET | Freq: Every day | ORAL | Status: DC
  Administered 2016-08-24 – 2016-08-26 (×3): 100 mg via ORAL
  Filled 2016-08-24 (×3): qty 2

## 2016-08-24 MED ORDER — FLUPHENAZINE HCL 2.5 MG/ML IJ SOLN: 37.5000 mg | INTRAMUSCULAR | Status: DC

## 2016-08-24 MED ORDER — ONDANSETRON HCL 4 MG/2ML IJ SOLN: 4.0000 mg | Freq: Four times a day (QID) | INTRAMUSCULAR | Status: DC | PRN

## 2016-08-24 MED ORDER — HYDROCODONE-ACETAMINOPHEN 5-325 MG PO TABS
1.0000 | ORAL_TABLET | Freq: Four times a day (QID) | ORAL | Status: DC | PRN
  Administered 2016-08-24: 1 via ORAL
  Filled 2016-08-24: qty 1

## 2016-08-24 MED ORDER — ACETAMINOPHEN 650 MG RE SUPP: 650.0000 mg | Freq: Four times a day (QID) | RECTAL | Status: DC | PRN

## 2016-08-24 MED ORDER — ACETAMINOPHEN 325 MG PO TABS: 650.0000 mg | ORAL_TABLET | Freq: Four times a day (QID) | ORAL | Status: DC | PRN

## 2016-08-24 MED ORDER — AZITHROMYCIN 500 MG IV SOLR
500.0000 mg | INTRAVENOUS | Status: DC
  Administered 2016-08-25: 500 mg via INTRAVENOUS
  Filled 2016-08-24: qty 500

## 2016-08-24 MED ORDER — ONDANSETRON HCL 4 MG PO TABS: 4.0000 mg | ORAL_TABLET | Freq: Four times a day (QID) | ORAL | Status: DC | PRN

## 2016-08-24 MED ORDER — INSULIN ASPART 100 UNIT/ML ~~LOC~~ SOLN: 0.0000 [IU] | Freq: Three times a day (TID) | SUBCUTANEOUS | Status: DC

## 2016-08-24 MED ORDER — IPRATROPIUM-ALBUTEROL 0.5-2.5 (3) MG/3ML IN SOLN
3.0000 mL | Freq: Four times a day (QID) | RESPIRATORY_TRACT | Status: DC
  Administered 2016-08-24 (×2): 3 mL via RESPIRATORY_TRACT
  Filled 2016-08-24 (×2): qty 3

## 2016-08-24 MED ORDER — DEXTROSE 5 % IV SOLN
1.0000 g | INTRAVENOUS | Status: DC
  Administered 2016-08-25 – 2016-08-26 (×2): 1 g via INTRAVENOUS
  Filled 2016-08-24 (×2): qty 10

## 2016-08-24 MED ORDER — PRAVASTATIN SODIUM 40 MG PO TABS
40.0000 mg | ORAL_TABLET | Freq: Every day | ORAL | Status: DC
  Administered 2016-08-24 – 2016-08-25 (×2): 40 mg via ORAL
  Filled 2016-08-24 (×2): qty 1

## 2016-08-24 MED ORDER — DOXAZOSIN MESYLATE 2 MG PO TABS
4.0000 mg | ORAL_TABLET | Freq: Every day | ORAL | Status: DC
  Filled 2016-08-24: qty 2

## 2016-08-24 MED ORDER — SENNOSIDES-DOCUSATE SODIUM 8.6-50 MG PO TABS
1.0000 | ORAL_TABLET | Freq: Two times a day (BID) | ORAL | Status: DC
  Administered 2016-08-24 – 2016-08-26 (×5): 1 via ORAL
  Filled 2016-08-24 (×5): qty 1

## 2016-08-24 MED ORDER — DOXAZOSIN MESYLATE 2 MG PO TABS
4.0000 mg | ORAL_TABLET | Freq: Every day | ORAL | Status: DC
  Administered 2016-08-24 – 2016-08-25 (×2): 4 mg via ORAL
  Filled 2016-08-24 (×2): qty 2

## 2016-08-24 MED ORDER — HYDROXYZINE HCL 10 MG PO TABS
10.0000 mg | ORAL_TABLET | Freq: Every day | ORAL | Status: DC
  Administered 2016-08-24 – 2016-08-25 (×2): 10 mg via ORAL
  Filled 2016-08-24 (×3): qty 1

## 2016-08-24 NOTE — Progress Notes (Signed)
   Subjective: No acute events overnight. Patient says he already feels somewhat improved. Does endorse mild increase in chronic low back pain. This is mainly lumbosacral and less likely related to a potential pyelonephritis. He does have some shortness of breath but this is also improved. No production with his cough.  Objective:  Vital signs in last 24 hours: Vitals:   08/24/16 0230 08/24/16 0330 08/24/16 0552 08/24/16 0857  BP: 169/84 (!) 164/75 134/66 (!) 155/92  Pulse: 111 99 (!) 102 98  Resp: 16 18 18 18   Temp:  99.8 F (37.7 C) 98.8 F (37.1 C) 98.5 F (36.9 C)  TempSrc:  Oral Oral Oral  SpO2: 92%  100% 100%  Weight:  228 lb 3.2 oz (103.5 kg)    Height:  6\' 1"  (1.854 m)     Physical Exam  Constitutional: He is oriented to person, place, and time. He appears well-developed and well-nourished.  HENT:  Head: Normocephalic and atraumatic.  Cardiovascular: Normal rate and regular rhythm.  Exam reveals no friction rub.   No murmur heard. Respiratory: Effort normal. He has wheezes.  Lower field expiratory wheezing bilaterally  Neurological: He is alert and oriented to person, place, and time.     Assessment/Plan:  Active Problems:   UTI (urinary tract infection)  [name] is a 61 y.o. male with DM, HTN, HLD, paranoid schizophrenia who presents with fever, chills, dizziness, and SOB and dysuria found to have UTI and potential CAP.  # CAP? Patient initially covered for CAP. Review of CXR does not reveal focal consolidation. No fever and no crackles heard on examination. I favor CAP less likely. Shortness of breath may be 2/2 minimal pulmonary edema. Mild interstitial markings present. However, for now we will continue with Azithromycin for atypical CAP coverage given CXR findings. If patient continues to improve with treatment of his  UTI will d/c Azithromycin tomorrow AM. Procalcitonin not helpful given UTI. - CTX + Azithro - Duonebs q6 hours for wheezing  -  Continue to monitor   # Complicated UTI Dirty UA w/ leuks and small blood. C/o dysuria. Culture sensitive to Ceftriaxone. H/o BPH with c/o frequent urination, nocturia, and some dribbling over the last several months. Will treat for UTI. I would consider PSA screening in the outpatient setting in this AA man with a smoking Hx and ongoing LUTS. Will defer to PCP to discuss risk and benefits of PSA screening in accordance with USPSTF guidelines.   - Abx as above - GC/CT Urine - consider adding Tamsulosin to Doxazosin for BPH  # DM  Not currently on medications. Last Hgb A1c 7.2. Recheck today.Pravastatin for primary ppx.  # HTN  BP mildly elevated. Continue home meds: losartan 100mg  qD, doxazosin 4mg  1D.  # Paranoid Schizophrenia:  Well controlled, follows regularly with psych. On fluphenazine shots q 2wks. Continue hydroxyzine  10mg  qHS for sleep. -- Fluphenazine x 1 today   DVT PPx - low molecular weight heparin FEN/GI: Regular diet Code Status - Full  Dispo: Anticipated discharge tomorrow.  Ophelia Shoulder, MD 08/24/2016, 10:18 AM Pager: [phone]

## 2016-08-24 NOTE — Evaluation (Signed)
Physical Therapy Evaluation Patient Details Name: Wayne Schmidt MRN: [id number] DOB: 1955/09/22 Today's Date: 08/24/2016   History of Present Illness  patient is a 61 year old male with past medical history of schizophrenia, hypertension, diabetes who presented to the ED with fevers/chills and dysuria. He has been diagnosed wtih pulmonary edema.  Clinical Impression  Pt near baseline performance for mobility.  He reports he will have family to assist intermittently with instrumental ADLs.  No safety concerns observed. Pt at mobility level safe to discharge home once medically ready. Formal PT not warranted further.    Follow Up Recommendations No PT follow up;Supervision - Intermittent    Equipment Recommendations  None recommended by PT    Recommendations for Other Services       Precautions / Restrictions Precautions Precautions: Fall Restrictions Weight Bearing Restrictions: No      Mobility  Bed Mobility Overal bed mobility: Independent             General bed mobility comments: supine to/from sit  Transfers Overall transfer level: Independent Equipment used: None             General transfer comment: good transfer safety, independent with toilet transfer and managing shorts  Ambulation/Gait Ambulation/Gait assistance: Independent Ambulation Distance (Feet): 300 Feet Assistive device: None Gait Pattern/deviations: Decreased step length - right;Decreased step length - left Gait velocity: 20' in 15.76 sec = 1.27 ft/sec Gait velocity interpretation: <1.8 ft/sec, indicative of risk for recurrent falls General Gait Details: good safety observed while walking down hall  Stairs            Wheelchair Mobility    Modified Rankin (Stroke Patients Only)       Balance Overall balance assessment: Independent                                           Pertinent Vitals/Pain Pain Assessment: No/denies pain    Home Living  Family/patient expects to be discharged to:: Private residence Living Arrangements: Alone Available Help at Discharge: Family;Available PRN/intermittently Type of Home: Apartment Home Access: Level entry     Home Layout: One level Home Equipment: None      Prior Function Level of Independence: Independent         Comments: 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 he drives and is independent wtih IADLs     Hand Dominance   Dominant Hand: Right    Extremity/Trunk Assessment   Upper Extremity Assessment Upper Extremity Assessment: Overall WFL for tasks assessed    Lower Extremity Assessment Lower Extremity Assessment: Overall WFL for tasks assessed    Cervical / Trunk Assessment Cervical / Trunk Assessment: Normal  Communication   Communication: No difficulties  Cognition Arousal/Alertness: Awake/alert Behavior During Therapy: WFL for tasks assessed/performed Overall Cognitive Status: Within Functional Limits for tasks assessed                      General Comments General comments (skin integrity, edema, etc.): post exercise HR 94 bpm, pt reports min lightheadedness during mobility    Exercises     Assessment/Plan    PT Assessment Patent does not need any further PT services  PT Problem List         PT Treatment Interventions      PT Goals (Current goals can be found in the Care Plan section)  Acute Rehab PT Goals Patient Stated Goal: return home, get this bathroom stuff (urinary urgency) worked out PT Goal Formulation: With patient/family    Frequency     Barriers to discharge        Co-evaluation               End of Session Equipment Utilized During Treatment: Gait belt Activity Tolerance: Patient tolerated treatment well Patient left: in bed;with call bell/phone within reach;with family/visitor present Nurse Communication: Mobility status PT Visit Diagnosis: Muscle weakness (generalized) (M62.81)     Functional Assessment Tool Used: AM-PAC 6 Clicks Basic Mobility;Clinical judgement Functional Limitation: Mobility: Walking and moving around Mobility: Walking and Moving Around Current Status [id number]): At least 1 percent but less than 20 percent impaired, limited or restricted Mobility: Walking and Moving Around Goal Status [phone]): At least 1 percent but less than 20 percent impaired, limited or restricted    Time: 1450-1510 PT Time Calculation (min) (ACUTE ONLY): 20 min   Charges:   PT Evaluation $PT Eval Low Complexity: 1 Procedure     PT G Codes:   PT G-Codes **NOT FOR INPATIENT CLASS** Functional Assessment Tool Used: AM-PAC 6 Clicks Basic Mobility;Clinical judgement Functional Limitation: Mobility: Walking and moving around Mobility: Walking and Moving Around Current Status [id number]): At least 1 percent but less than 20 percent impaired, limited or restricted Mobility: Walking and Moving Around Goal Status [phone]): At least 1 percent but less than 20 percent impaired, limited or restricted   Malka So, [place]  [place] 08/24/2016, 4:06 PM

## 2016-08-24 NOTE — Telephone Encounter (Signed)
Patient did not come for his appointment this morning to get his bi-weekly injection of Prolixin. This is unusual for this patient, when I pulled up his chart to call him I discovered that he has been admitted over at [facility] on [address] for pneumonia. I called patient and he is doing well, I spoke with his nurse to make sure that they know to give him his injection.

## 2016-08-24 NOTE — ED Notes (Signed)
Pt and family made aware of bed assignment 

## 2016-08-24 NOTE — H&P (Signed)
Date: 08/24/2016               Patient Name:  Wayne Schmidt MRN: [id number]  DOB: Sep 01, 1955 Age / Sex: 61 y.o., male   PCP: Aldine Contes, MD         Medical Service: Internal Medicine Teaching Service         Attending Physician: Dr. Aldine Contes, MD    First Contact: Dr. Ophelia Shoulder Pager: [phone]  Second Contact: Dr. Zada Finders Pager: [phone]       After Hours (After 5p /  First Contact Pager: [phone]  Weekends / Holidays): Second Contact Pager: [phone]   Chief Complaint: dizziness  History of Present Illness: Wayne Schmidt is a 61 y.o. male with a h/o DM, HTN, HLD, paranoid schizophrenia who presents with fever, chills, dizziness, and SOB and dysuria.  Patient was a 1 day history of feeling poorly. He reports that 2 days ago in the evening he started to feel feverish with chills. The next day he noted dizziness and some mild shortness of breath. He also complained of dysuria with urinary hesitancy. Patient reports that he thought he would have diarrhea and went to the toilet but was unable to have a bowel movement. He denies any significant abdominal pain, chest pain. Patient notes some generalized weakness but denies any myalgias. Patient presented to urgent care and was referred to the emergency department for concern of pneumonia.   Patient has a remote history of gonorrhea and crabs but no recent STI. Patient reports that he has not been sexually active for the last 8 years. Patient reports that he has had one prior episode of urinary symptoms for which he presented to [facility] and underwent urinary catheterization, but is unable to report the outcome of this workup.  In the emergency department, he was found to be mildly febrile with temperatures to 100, mildly hypoxic with exertion, had a chest x-ray which demonstrated interstitial prominence concerning for infection, he had a UA which demonstrated significant leukocytes and small amount of hemoglobin. He  was started on ceftriaxone and azithromycin for concern of UTI, and given 1 L normal saline bolus.  Meds: Current Outpatient Prescriptions  Medication Sig Dispense Refill  . ciclopirox (LOPROX) 0.77 % SUSP     . doxazosin (CARDURA) 4 MG tablet Take 1 tablet (4 mg total) by mouth daily. 90 tablet 0  . fluPHENAZine decanoate (PROLIXIN) 25 MG/ML injection Inject 1.5 mLs (37.5 mg total) into the muscle every 14 (fourteen) days. 5 mL 2  . hydrOXYzine (ATARAX/VISTARIL) 10 MG tablet Take 1 tablet (10 mg total) by mouth at bedtime. 90 tablet 0  . ibuprofen (ADVIL,MOTRIN) 800 MG tablet Take 1 tablet (800 mg total) by mouth every 8 (eight) hours as needed for moderate pain. 90 tablet 0  . losartan (COZAAR) 100 MG tablet Take 1 tablet (100 mg total) by mouth daily. 90 tablet 1  . pravastatin (PRAVACHOL) 40 MG tablet Take 1 tablet (40 mg total) by mouth daily. 90 tablet 3   Allergies: Allergies as of 08/23/2016 - Review Complete 08/23/2016  Allergen Reaction Noted  . Ace inhibitors Cough 07/16/2014  . Atorvastatin  11/01/2006  . Chlorpromazine hcl  11/01/2006  . Simvastatin  11/01/2006   Past Medical History:  Diagnosis Date  . Diabetes mellitus without complication ([place])   . High risk sexual behavior 10/2007  . Hypertension   . Microcytosis   . Schizophrenia ([place])   . Substance abuse    tobacco  use   Family History: Pt family history includes Diabetes in his mother; Drug abuse in his brother; Hypertension in his mother; Lupus in his sister; Sickle cell anemia in his brother.  Social History: Pt  reports that he has been smoking Cigarettes.  He has a 38.00 pack-year smoking history. He has never used smokeless tobacco. He reports that he drinks about 1.2 oz of alcohol per week . He reports that he does not use drugs.  Review of Systems: A complete ROS was negative except as per HPI. Review of Systems  Constitutional: Positive for chills, fever and malaise/fatigue. Negative for weight  loss.  Eyes: Negative for blurred vision.  Respiratory: Positive for shortness of breath. Negative for cough.   Cardiovascular: Negative for chest pain and leg swelling.  Gastrointestinal: Negative for abdominal pain, constipation, diarrhea, nausea and vomiting.  Genitourinary: Positive for dysuria. Negative for frequency and urgency.  Musculoskeletal: Negative for myalgias.  Skin: Negative for rash.  Neurological: Positive for dizziness and weakness. Negative for tremors and headaches.  Endo/Heme/Allergies: Negative for polydipsia.  Psychiatric/Behavioral: The patient is not nervous/anxious.    Physical Exam: Vitals:   08/23/16 2138 08/23/16 2139  BP: 138/91   Pulse: 98   Resp: 18   Temp: 99.5 F (37.5 C)   TempSrc: Oral   SpO2: 96%   Weight:  233 lb (105.7 kg)  Height:  6' 0.5" (1.842 m)   Physical Exam  Constitutional: He is oriented to person, place, and time. He appears well-developed and well-nourished. He is cooperative. No distress.  HENT:  Head: Normocephalic and atraumatic.  Right Ear: Hearing normal.  Left Ear: Hearing normal.  Nose: Nose normal.  Mouth/Throat: Mucous membranes are dry.  Cardiovascular: Normal rate, regular rhythm, S1 normal, S2 normal and intact distal pulses.  Exam reveals no gallop.   No murmur heard. Pulmonary/Chest: Effort normal. No respiratory distress. He has no wheezes. He has no rhonchi. He has rales. He exhibits no tenderness.  Abdominal: Soft. Normal appearance and bowel sounds are normal. He exhibits no ascites. There is no hepatosplenomegaly. There is no tenderness.  Genitourinary: Rectum normal and testes normal. Rectal exam shows no mass, no tenderness and anal tone normal. Prostate is enlarged (mild). Prostate is not tender. No penile erythema or penile tenderness. No discharge found.  Musculoskeletal: He exhibits no edema.  Neurological: He is alert and oriented to person, place, and time. He has normal strength.  Skin: Skin is  warm, dry and intact. He is not diaphoretic.  Psychiatric: He has a normal mood and affect. His speech is normal and behavior is normal.   Labs: CBC:  Recent Labs Lab 08/23/16 2141  WBC 16.5*  NEUTROABS 10.6*  HGB 15.1  HCT 45.1  MCV 73.7*  PLT [id number]   Basic Metabolic Panel:  Recent Labs Lab 08/23/16 2141  NA 136  K 4.2  CL 103  CO2 27  GLUCOSE 137*  BUN 12  CREATININE 1.37*  CALCIUM 9.2   Cardiac Enzymes:  Recent Labs Lab 08/23/16 2355  TROPIPOC 0.00   BNP (last 3 results)  Recent Labs  08/23/16 2150  BNP 30.9   Liver Function Tests:  Recent Labs Lab 08/23/16 2141  AST 20  ALT 14*  ALKPHOS 62  BILITOT 0.6  PROT 7.4  ALBUMIN 3.9   CBG: Lab Results  Component Value Date   HGBA1C 7.1 04/11/2016   Urinalysis    Component Value Date/Time   COLORURINE YELLOW 08/23/2016 2327   APPEARANCEUR HAZY (  A) 08/23/2016 2327   LABSPEC 1.015 08/23/2016 2327   PHURINE 5.0 08/23/2016 2327   GLUCOSEU NEGATIVE 08/23/2016 2327   HGBUR SMALL (A) 08/23/2016 2327   BILIRUBINUR NEGATIVE 08/23/2016 2327   KETONESUR NEGATIVE 08/23/2016 2327   PROTEINUR NEGATIVE 08/23/2016 2327   UROBILINOGEN 0.2 08/23/2016 2057   NITRITE NEGATIVE 08/23/2016 2327   LEUKOCYTESUR LARGE (A) 08/23/2016 2327   Imaging: EKG Interpretation  Date/Time:  Wednesday August 23 2016 23:19:23 EST Ventricular Rate:  97 PR Interval:    QRS Duration: 92 QT Interval:  337 QTC Calculation: 428 R Axis:   74 Text Interpretation:  Sinus rhythm RAE, consider biatrial enlargement Baseline wander in lead(s) V5 V6 Confirmed by WARD,  DO, KRISTEN [phone]) on 08/23/2016 11:46:43 PM  Dg Chest 2 View  Result Date: 08/23/2016 CLINICAL DATA:  Acute onset of shortness of breath and right basilar crackles. Low-grade fever. Initial encounter. EXAM: CHEST  2 VIEW COMPARISON:  Chest radiograph performed 05/20/2014 FINDINGS: The lungs are well-aerated. Increased interstitial markings may reflect mild infection  or possibly edema. There is no evidence of pleural effusion or pneumothorax. The heart is normal in size; the mediastinal contour is within normal limits. No acute osseous abnormalities are seen. IMPRESSION: Increased interstitial markings may reflect mild infection or possibly interstitial edema. Electronically Signed   By: Garald Balding M.D.   On: 08/23/2016 21:01   Assessment & Plan by Problem: Active Problems:   UTI (urinary tract infection)  Wayne Schmidt is a 61 y.o. male with DM, HTN, HLD, paranoid schizophrenia who presents with fever, chills, dizziness, and SOB and dysuria found to have CAP and UTI.  1) CAP: Smoker w/ 1PPD but no h/o COPD, no wheezes on exam. CXR w/ concern for PNA vs edema, euvolemic on exam so edema less likely. Mild fevers, leukocytosis suggests infection. CXR w/o consolidation, would favor viral or atypical infection given interstitial prominence. Influenza negative. C/o dizziness, likely 2/2 dehydration in the setting of insensible losses. - admit to med-surg - CTX + Azithro - Duonebs PRN - Supplemental O2 via [place], wean as tolerated - IVF @75cc /hr - Procalcitonin - strep urine Ag - BCx  2) UTI: Dirty UA w/ leuks and small blood. C/o dysuria. Will follow UCx, but should be covered by CTX for CAP. H/o BPH with c/o frequent urination, nocturia, and some dribbling over the last several months. Pt reports having been at [facility] for urinary symptoms in the past, but review of the EMR does not show any evidence of ER Visit or Admission. Record review demonstrates h/o prostatitis in 2013 treated with Cipro followed up w/ Urology. Mild BPH on prostate exam, no bogginess or TTP. No urethral discharge or genital sores noted. - IVF as above - Abx as above - GC/CT Urine - consider adding Tamsulosin to Doxazosin for BPH  3) DM: Not currently on medications. Last Hgb A1c 7.2. Recheck today.Pravastatin for primary ppx.  4) HTN: BP mildly elevated. Continue home meds:  losartan 100mg  qD, doxazosin 4mg  1D.  5) Paranoid Schizophrenia: Well controlled, follows regularly with psych. On fluphenazine shots q 2wks. Continue hydroxyzine  10mg  qHS for sleep. - received 1 dose of fluphenazine in ED  DVT PPx - low molecular weight heparin  Code Status - Full  Dispo: Admit patient to Observation with expected length of stay less than 2 midnights.  Signed: Holley Raring, MD 08/24/2016, 1:01 AM  Pager: [phone]

## 2016-08-24 NOTE — Care Management Note (Signed)
Case Management Note  Patient Details  Name: Wayne Schmidt MRN: [id number] Date of Birth: [birth year]  Subjective/Objective:  CM following for progression and d/c planning.                   Action/Plan: 08/24/2016 Noted CM consult re medication that pt routinely receives every 14 days. This CM consulted pharmacist Blima Singer who is following up on pt medication history and will assist with medication needs.   Expected Discharge Date:  08/26/16               Expected Discharge Plan:  Home/Self Care  In-House Referral:  Clinical Social Work  Discharge planning Services  CM Consult, Medication Assistance  Post Acute Care Choice:  NA Choice offered to:  NA  DME Arranged:    DME Agency:     HH Arranged:    HH Agency:     Status of Service:  In process, will continue to follow  If discussed at Long Length of Stay Meetings, dates discussed:    Additional Comments:  Adron Bene, RN 08/24/2016, 8:33 AM

## 2016-08-24 NOTE — Care Management Obs Status (Signed)
[place] NOTIFICATION   Patient Details  Name: Wayne Schmidt MRN: [id number] Date of Birth: 01-26-1956   Medicare Observation Status Notification Given:  Yes    Carles Collet, RN 08/24/2016, 2:29 PM

## 2016-08-25 DIAGNOSIS — R351 Nocturia: Secondary | ICD-10-CM

## 2016-08-25 DIAGNOSIS — B9620 Unspecified Escherichia coli [E. coli] as the cause of diseases classified elsewhere: Secondary | ICD-10-CM

## 2016-08-25 LAB — URINE CYTOLOGY ANCILLARY ONLY
CHLAMYDIA, DNA PROBE: NEGATIVE
NEISSERIA GONORRHEA: NEGATIVE
TRICH (WINDOWPATH): NEGATIVE

## 2016-08-25 LAB — HEMOGLOBIN A1C
HEMOGLOBIN A1C: 7.3 % — AB (ref 4.8–5.6)
MEAN PLASMA GLUCOSE: 163

## 2016-08-25 LAB — BASIC METABOLIC PANEL
Anion gap: 9 (ref 5–15)
BUN: 10 mg/dL (ref 6–20)
CHLORIDE: 103 mmol/L (ref 101–111)
CO2: 24 mmol/L (ref 22–32)
Calcium: 9.1 mg/dL (ref 8.9–10.3)
Creatinine, Ser: 1.28 mg/dL — ABNORMAL HIGH (ref 0.61–1.24)
GFR calc Af Amer: >60 mL/min (ref >60)
GFR calc non Af Amer: 59 mL/min — ABNORMAL LOW (ref >60)
GLUCOSE: 123 mg/dL — AB (ref 65–99)
POTASSIUM: 4.1 mmol/L (ref 3.5–5.1)
SODIUM: 136 mmol/L (ref 135–145)

## 2016-08-25 LAB — CBC
HEMATOCRIT: 42.7 % (ref 39.0–52.0)
HEMOGLOBIN: 14.2 g/dL (ref 13.0–17.0)
MCH: 24.4 pg — AB (ref 26.0–34.0)
MCHC: 33.3 g/dL (ref 30.0–36.0)
MCV: 73.2 fL — ABNORMAL LOW (ref 78.0–100.0)
Platelets: 135 10*3/uL — ABNORMAL LOW (ref 150–400)
RBC: 5.83 MIL/uL — AB (ref 4.22–5.81)
RDW: 15.6 % — ABNORMAL HIGH (ref 11.5–15.5)
WBC: 17.5 10*3/uL — ABNORMAL HIGH (ref 4.0–10.5)

## 2016-08-25 LAB — GLUCOSE, CAPILLARY
GLUCOSE-CAPILLARY: 123 mg/dL — AB (ref 65–99)
GLUCOSE-CAPILLARY: 130 mg/dL — AB (ref 65–99)
Glucose-Capillary: 119 mg/dL — ABNORMAL HIGH (ref 65–99)
Glucose-Capillary: 107 mg/dL — ABNORMAL HIGH (ref 65–99)

## 2016-08-25 MED ORDER — IPRATROPIUM-ALBUTEROL 0.5-2.5 (3) MG/3ML IN SOLN: 3.0000 mL | RESPIRATORY_TRACT | Status: DC | PRN

## 2016-08-25 NOTE — Care Management Note (Signed)
Case Management Note  Patient Details  Name: Wayne Schmidt MRN: [id number] Date of Birth: [birth year]  Subjective/Objective:      CM following for progression and d/c planning.               Action/Plan: CM following for d/c needs. Noted pt ambulating in the hallway with therapist on 08/25/2016. No d/c needs identified at this time.   Expected Discharge Date:  08/26/16               Expected Discharge Plan:  Home/Self Care  In-House Referral:  Clinical Social Work  Discharge planning Services  CM Consult, Medication Assistance  Post Acute Care Choice:  NA Choice offered to:  NA  DME Arranged:    DME Agency:     HH Arranged:    HH Agency:     Status of Service:  In process, will continue to follow  If discussed at Long Length of Stay Meetings, dates discussed:    Additional Comments:  Adron Bene, RN 08/25/2016, 10:54 AM

## 2016-08-25 NOTE — Progress Notes (Signed)
   Subjective:  No acute events overnight. Patient continues to have polyuria and dysuria. He states that his dizziness, shortness of breath and fatigue have improved. He has no additional questions this morning. Objective:  Vital signs in last 24 hours: Vitals:   08/24/16 2054 08/24/16 2149 08/25/16 0452 08/25/16 0500  BP: (!) 151/87  125/75   Pulse: 99  (!) 107   Resp: 18  19   Temp: 99.6 F (37.6 C)  98.3 F (36.8 C)   TempSrc: Oral  Oral   SpO2: 100% 99% 94%   Weight: 228 lb 9.6 oz (103.7 kg)   228 lb 9.9 oz (103.7 kg)  Height: 6' (1.829 m)      Physical Exam  Constitutional: He is oriented to person, place, and time. He appears well-developed and well-nourished.  HENT:  Head: Normocephalic and atraumatic.  Cardiovascular: Normal rate and regular rhythm.  Exam reveals no friction rub.   No murmur heard. Respiratory: Effort normal. He has no wheezes.  Neurological: He is alert and oriented to person, place, and time.     Assessment/Plan:  Active Problems:   UTI (urinary tract infection)  [name] is a 61 y.o. male with DM, HTN, HLD, paranoid schizophrenia who presents with fever, chills, dizziness, and SOB and dysuria found to have UTI and potential CAP.   # Complicated UTI Dirty UA w/ leuks and small blood. C/o dysuria. Culture sensitive to Ceftriaxone. H/o BPH with c/o frequent urination, nocturia, and some dribbling over the last several months. Will treat for UTI.I do not think the patient has pneumonia clinically or radiographically. We'll discontinue his azithromycin today. Patient's leukocytosis increased mildly from yesterday. Sensitivities from his urine culture have not returned yet. We will keep him an additional day for treatment as we wait for sensitivities and antibiotic selection. I would consider PSA screening in the outpatient setting in this AA man with a smoking Hx and ongoing LUTS. Will defer to PCP to discuss risk and benefits of PSA screening  in accordance with USPSTF guidelines.   - Discontinue azithromycin, continue ceftriaxone, transitioned from ceftriaxone to oral equivalent at discharge - Follow up urine culture and sensitivities - GC/CT Urine- have not resulted  # DM  Not currently on medications. Last Hgb A1c 7.2. Recheck today.Pravastatin for primary ppx.  # HTN  Currently normotensive. Continue home meds: losartan 100mg  qD, doxazosin 4mg  1D.  # Paranoid Schizophrenia:  Well controlled, follows regularly with psych. On fluphenazine shots q 2wks. Continue hydroxyzine  10mg  qHS for sleep. -- Fluphenazine x 1 today   DVT PPx - low molecular weight heparin FEN/GI: Regular diet Code Status - Full  Dispo: Anticipated discharge tomorrow.  Ophelia Shoulder, MD 08/25/2016, 7:23 AM Pager: [phone]

## 2016-08-26 LAB — CBC
HEMATOCRIT: 41.9 % (ref 39.0–52.0)
Hemoglobin: 13.9 g/dL (ref 13.0–17.0)
MCH: 24.2 pg — ABNORMAL LOW (ref 26.0–34.0)
MCHC: 33.2 g/dL (ref 30.0–36.0)
MCV: 72.9 fL — AB (ref 78.0–100.0)
PLATELETS: 132 10*3/uL — AB (ref 150–400)
RBC: 5.75 MIL/uL (ref 4.22–5.81)
RDW: 15.2 % (ref 11.5–15.5)
WBC: 11.7 10*3/uL — ABNORMAL HIGH (ref 4.0–10.5)

## 2016-08-26 LAB — URINE CULTURE

## 2016-08-26 LAB — PROCALCITONIN: Procalcitonin: 0.48 ng/mL

## 2016-08-26 LAB — GLUCOSE, CAPILLARY: GLUCOSE-CAPILLARY: 133 mg/dL — AB (ref 65–99)

## 2016-08-26 MED ORDER — SULFAMETHOXAZOLE-TRIMETHOPRIM 800-160 MG PO TABS: 1.0000 | ORAL_TABLET | Freq: Two times a day (BID) | ORAL | 0 refills | Status: AC

## 2016-08-26 NOTE — Progress Notes (Signed)
   Subjective:  No acute events overnight. Patient feels improved this morning. His energy is better. He has no specific complaints. He'll be appropriate for discharge with follow-up in clinic next week. Objective:  Vital signs in last 24 hours: Vitals:   08/25/16 2151 08/26/16 0418 08/26/16 0715 08/26/16 0855  BP: (!) 154/86  136/80 138/77  Pulse: 85  87 (!) 110  Resp: 18  18 18   Temp: 98.7 F (37.1 C)  98.5 F (36.9 C) 98.9 F (37.2 C)  TempSrc: Oral  Oral Oral  SpO2: 97%  98% 94%  Weight: 223 lb 6.4 oz (101.3 kg) 223 lb 5.2 oz (101.3 kg)    Height:       Physical Exam  Constitutional: He is oriented to person, place, and time. He appears well-developed and well-nourished.  HENT:  Head: Normocephalic and atraumatic.  Cardiovascular: Normal rate and regular rhythm.  Exam reveals no friction rub.   No murmur heard. Respiratory: Effort normal. He has no wheezes.  Neurological: He is alert and oriented to person, place, and time.     Assessment/Plan:  Active Problems:   UTI (urinary tract infection)  [name] is a 61 y.o. male with DM, HTN, HLD, paranoid schizophrenia who presents with fever, chills, dizziness, and SOB and dysuria found to have UTI and potential CAP.   # Complicated UTI Leukocytosis improved. Patient symptomatically improved. Urine cultures returned and the Escherichia coli growing is pansensitive to all antibiotics tested. We'll transition him to an oral regimen and continue treatment for an additional 7 days for a total course of therapy of 10 days for urinary tract infection in a male. -- Bactrim twice a day 7 days -- Follow-up in the [facility] internal medicine teaching clinic  # DM  Not currently on medications. Last Hgb A1c 7.2. Recheck today.Pravastatin for primary ppx.  # HTN  Currently normotensive. Continue home meds: losartan 100mg  qD, doxazosin 4mg  1D.  # Paranoid Schizophrenia:  Well controlled, follows regularly with  psych. On fluphenazine shots q 2wks. Continue hydroxyzine  10mg  qHS for sleep. -- Fluphenazine x 1 today   DVT PPx - low molecular weight heparin FEN/GI: Regular diet Code Status - Full  Dispo: Anticipated discharge today.   Ophelia Shoulder, MD 08/26/2016, 9:43 AM Pager: [phone]

## 2016-08-26 NOTE — Discharge Summary (Signed)
Name: Wayne Schmidt MRN: [id number] DOB: 09-Oct-1955 61 y.o. PCP: Wayne Contes, MD  Date of Admission: 08/23/2016 10:20 PM Date of Discharge: 08/26/2016 Attending Physician: Wayne Belt, MD  Discharge Diagnosis: 1. Urinary tract infection 2. Hypertension 3. Paranoid schizophrenia Active Problems:   UTI (urinary tract infection)   Discharge Medications: Allergies as of 08/26/2016      Reactions   Ace Inhibitors Cough   Atorvastatin Other (See Comments)   Dizzy / Faint   Chlorpromazine Hcl Other (See Comments)   Passed out    Simvastatin Other (See Comments)   Dizzy / Faint      Medication List    TAKE these medications   doxazosin 4 MG tablet Commonly known as:  CARDURA Take 1 tablet (4 mg total) by mouth daily.   fluPHENAZine decanoate 25 MG/ML injection Commonly known as:  PROLIXIN Inject 1.5 mLs (37.5 mg total) into the muscle every 14 (fourteen) days.   hydroxypropyl methylcellulose / hypromellose 2.5 % ophthalmic solution Commonly known as:  ISOPTO TEARS / GONIOVISC Place 2 drops into both eyes daily as needed for dry eyes.   hydrOXYzine 10 MG tablet Commonly known as:  ATARAX/VISTARIL Take 1 tablet (10 mg total) by mouth at bedtime.   ibuprofen 800 MG tablet Commonly known as:  ADVIL,MOTRIN Take 1 tablet (800 mg total) by mouth every 8 (eight) hours as needed for moderate pain.   losartan 100 MG tablet Commonly known as:  COZAAR Take 1 tablet (100 mg total) by mouth daily.   pravastatin 40 MG tablet Commonly known as:  PRAVACHOL Take 1 tablet (40 mg total) by mouth daily.   sulfamethoxazole-trimethoprim 800-160 MG tablet Commonly known as:  BACTRIM DS,SEPTRA DS Take 1 tablet by mouth 2 (two) times daily.       Disposition and follow-up:   WayneElester Schmidt was discharged from [facility] in Good condition.  At the hospital follow up visit please address:  1.  Please ensure the patient has finished his antibiotics.  Please follow up regarding the patient's A1c. Please consider PSA screening at some time point in this African-American male with a significant tobacco abuse history and an enlarged prostate.  2.  Labs / imaging needed at time of follow-up: None  3.  Pending labs/ test needing follow-up: None  Follow-up Appointments: 1. I discussed with the patient that he should make an appointment with the [facility] internal medicine teaching service  Hospital Course by problem list: Active Problems:   UTI (urinary tract infection)   1. Urinary tract infection The patient presented to the [facility] emergency department on 08/25/2015 with a several-day history of polyuria, dysuria, fatigue and malaise. Urinalysis in the emergency department looked consistent with infection. He was started on IV ceftriaxone. Urine culture grew Escherichia coli. Sensitivities demonstrated a pansensitive culture. While inpatient he remained on ceftriaxone and at discharge will be transitioned to trimethoprim sulfamethoxazole for 7 additional days. He was treated for 3 days with IV ceftriaxone. The 7 additional days of trimethoprim sulfamethoxazole will complete a 10 day course for the treatment of urinary tract infection in a male. On the day of discharge his fatigue and malaise had improved. His dysuria had improved but he was still having some polyuria with frequency. He has a history of BPH and an enlarged prostate. I would consider at some point following up with a PSA screening given his enlarged prostate and significant tobacco abuse history. However, I do not think this should be  obtained at this hospital follow-up appointment as hisPSA may be falsely elevated secondary to recent infection.  2. Hypertension The patient is a history of hypertension. Was admitted to the hospital his home medications were restarted. He will continue with his home antihypertensive medication regimen. We have made no adjustments to his medicines  for his hypertension.  3. Paranoid schizophrenia The patient has a history of paranoid schizophrenia and follows with psychiatry. He gets fluphenazine shots every 2 weeks. He was due for this while in the hospital and this was given to him. He can continue with his current schedule of fluphenazine as he has not missed a dose.  Discharge Vitals:   BP 138/77 (BP Location: Left Arm)   Pulse (!) 110   Temp 98.9 F (37.2 C) (Oral)   Resp 18   Ht 6' (1.829 m)   Wt 223 lb 5.2 oz (101.3 kg)   SpO2 94%   BMI 30.29 kg/m   Pertinent Labs, Studies, and Procedures:  1. Two-view diagnostic chest radiograph increased interstitial markings  Discharge Instructions: Discharge Instructions    Diet - low sodium heart healthy    Complete by:  As directed    Discharge instructions    Complete by:  As directed    We are treating you for a urinary tract infection. I will prescribe an antibiotic you'll need to take for the next 7 days. The antibiotic is called Bactrim. Please take 1 pill in the morning and 1 pill at night for the next 7 days.  Additionally, I will send an email to the clinic and let them know you need to schedule an appointment. They should contact you on Monday to arrange this. Please make an appointment to be seen in the [facility] internal medicine teaching clinic next week for hospital follow-up. Please ensure you finish the antibiotic as prescribed.   Increase activity slowly    Complete by:  As directed       Signed: Ophelia Shoulder, MD 08/26/2016, 9:51 AM   Pager: [phone]

## 2016-08-27 ENCOUNTER — Emergency Department (HOSPITAL_COMMUNITY)
Admission: EM | Admit: 2016-08-27 | Discharge: 2016-08-27 | Disposition: A | Discharge status: Home / Self Care | Payer: Medicare Other | Attending: Emergency Medicine | Admitting: Emergency Medicine

## 2016-08-27 ENCOUNTER — Encounter (HOSPITAL_COMMUNITY): Payer: Self-pay | Admitting: Emergency Medicine

## 2016-08-27 DIAGNOSIS — I10 Essential (primary) hypertension: Secondary | ICD-10-CM | POA: Diagnosis not present

## 2016-08-27 DIAGNOSIS — F17210 Nicotine dependence, cigarettes, uncomplicated: Secondary | ICD-10-CM | POA: Insufficient documentation

## 2016-08-27 DIAGNOSIS — R339 Retention of urine, unspecified: Secondary | ICD-10-CM | POA: Insufficient documentation

## 2016-08-27 DIAGNOSIS — E119 Type 2 diabetes mellitus without complications: Secondary | ICD-10-CM | POA: Diagnosis not present

## 2016-08-27 DIAGNOSIS — Z79899 Other long term (current) drug therapy: Secondary | ICD-10-CM | POA: Diagnosis not present

## 2016-08-27 DIAGNOSIS — R030 Elevated blood-pressure reading, without diagnosis of hypertension: Secondary | ICD-10-CM | POA: Diagnosis not present

## 2016-08-27 LAB — URINALYSIS, ROUTINE W REFLEX MICROSCOPIC
Bacteria, UA: NONE SEEN
Bilirubin Urine: NEGATIVE
Glucose, UA: 150 mg/dL — AB
KETONES UR: NEGATIVE mg/dL
Leukocytes, UA: NEGATIVE
Nitrite: NEGATIVE
PH: 6 (ref 5.0–8.0)
PROTEIN: NEGATIVE mg/dL
Specific Gravity, Urine: 1.011 (ref 1.005–1.030)

## 2016-08-27 MED ORDER — TAMSULOSIN HCL 0.4 MG PO CAPS: 0.4000 mg | ORAL_CAPSULE | Freq: Every day | ORAL | 0 refills | Status: DC

## 2016-08-27 NOTE — ED Notes (Addendum)
Bladder scan of 547ml

## 2016-08-27 NOTE — ED Notes (Signed)
Pt educated on home care of foley catheter. Pt demonstrates and verbalizes understanding.

## 2016-08-27 NOTE — ED Triage Notes (Signed)
Pt presents by EMS from home for evaluation of urinary retention. EMS advised that pt was discharged on 08/26/16 from [facility] for pneumonia. Pt stated that he has not been able to urinate in appx 15 hr.

## 2016-08-27 NOTE — ED Provider Notes (Signed)
[facility] DEPT Provider Note   CSN: [id number] Arrival date & time: 08/27/16  0421     History   Chief Complaint Chief Complaint  Patient presents with  . Urinary Retention    HPI Wayne Schmidt is a 61 y.o. male.  HPI  This is a 61 year old male with a history of diabetes, schizophrenia, hypertension who presents with urinary retention. Patient had a recent admission for urinary tract infection. This was pansensitive and he was discharged with 7 days of Bactrim. Patient reports 10 hour history of difficulty urinating. He states that he "urinates a little bit but obviously I'm not emptying." Denies any fevers or back pain. Reports history of retention but no history of Foley catheter placement.  Past Medical History:  Diagnosis Date  . Diabetes mellitus without complication ([place])   . High risk sexual behavior 10/2007  . Hypertension   . Microcytosis   . Schizophrenia ([place])   . Substance abuse    tobacco use    Patient Active Problem List   Diagnosis Date Noted  . UTI (urinary tract infection) 08/24/2016  . Health maintenance examination 04/01/2014  . Lumbar arthropathy ([place]) 08/04/2013  . 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 MICROCYTOSIS 02/24/2010  . BPH associated with nocturia 03/24/2009  . Paranoid schizophrenia, subchronic condition ([place]) 10/11/2006  . Hyperlipidemia 08/28/2006  . Diabetes mellitus without complication ([place]) 123456  . TOBACCO ABUSE 04/27/2006  . Essential hypertension 04/27/2006    Past Surgical History:  Procedure Laterality Date  . LAMINECTOMY     by Dr Rolin Barry       Home Medications    Prior to Admission medications   Medication Sig Start Date End Date Taking? Authorizing Provider  doxazosin (CARDURA) 4 MG tablet Take 1 tablet (4 mg total) by mouth daily. 08/16/16  Yes Aldine Contes, MD  fluPHENAZine decanoate (PROLIXIN) 25 MG/ML injection Inject 1.5 mLs (37.5 mg total) into the muscle every 14 (fourteen) days. 08/01/16   Yes [name], MD  hydroxypropyl methylcellulose / hypromellose (ISOPTO TEARS / GONIOVISC) 2.5 % ophthalmic solution Place 2 drops into both eyes daily as needed for dry eyes.   Yes Historical Provider, MD  hydrOXYzine (ATARAX/VISTARIL) 10 MG tablet Take 1 tablet (10 mg total) by mouth at bedtime. 08/10/16  Yes [name], MD  ibuprofen (ADVIL,MOTRIN) 800 MG tablet Take 1 tablet (800 mg total) by mouth every 8 (eight) hours as needed for moderate pain. 07/15/14  Yes Karlene Einstein, MD  losartan (COZAAR) 100 MG tablet Take 1 tablet (100 mg total) by mouth daily. 05/09/16  Yes Nischal Dareen Piano, MD  pravastatin (PRAVACHOL) 40 MG tablet Take 1 tablet (40 mg total) by mouth daily. 05/22/16  Yes Oval Linsey, MD  sulfamethoxazole-trimethoprim (BACTRIM DS,SEPTRA DS) 800-160 MG tablet Take 1 tablet by mouth 2 (two) times daily. 08/26/16 09/02/16 Yes Ophelia Shoulder, MD  tamsulosin (FLOMAX) 0.4 MG CAPS capsule Take 1 capsule (0.4 mg total) by mouth daily. 08/27/16   Merryl Hacker, MD    Family History Family History  Problem Relation Age of Onset  . Diabetes Mother   . Hypertension Mother   . Lupus Sister   . Sickle cell anemia Brother   . Drug abuse Brother     Social History Social History  Substance Use Topics  . Smoking status: Current Every Day Smoker    Packs/day: 1.00    Years: 38.00    Types: Cigarettes  . Smokeless tobacco: Never Used     Comment: cutting back.  Has tried  . Alcohol use 1.2 oz/week    2 Cans of beer per week     Comment: Reports drinks 2 twelve ounce beers a week.      Allergies   Ace inhibitors; Atorvastatin; Chlorpromazine hcl; and Simvastatin   Review of Systems Review of Systems  Constitutional: Negative for fever.  Respiratory: Negative for shortness of breath.   Cardiovascular: Negative for chest pain.  Gastrointestinal: Negative for abdominal pain.  Genitourinary: Positive for difficulty urinating. Negative for dysuria and flank pain.    Musculoskeletal: Negative for back pain.  All other systems reviewed and are negative.    Physical Exam Updated Vital Signs BP 139/92 (BP Location: Left Arm)   Pulse 93   Temp 97.5 F (36.4 C) (Oral)   Resp 18   Ht 6' 0.5" (1.842 m)   Wt 223 lb (101.2 kg)   SpO2 95%   BMI 29.83 kg/m   Physical Exam  Constitutional: He is oriented to person, place, and time.  Odd affect  HENT:  Head: Normocephalic and atraumatic.  Poor dentition  Cardiovascular: Normal rate, regular rhythm and normal heart sounds.   No murmur heard. Pulmonary/Chest: Effort normal and breath sounds normal. No respiratory distress.  Abdominal: Soft. Bowel sounds are normal. He exhibits no distension. There is no tenderness. There is no rebound and no guarding.  Genitourinary:  Genitourinary Comments: Foley catheter in place  Neurological: He is alert and oriented to person, place, and time.  Skin: Skin is warm and dry.  Psychiatric: He has a normal mood and affect.  Nursing note and vitals reviewed.    ED Treatments / Results  Labs (all labs ordered are listed, but only abnormal results are displayed) Labs Reviewed  URINALYSIS, ROUTINE W REFLEX MICROSCOPIC - Abnormal; Notable for the following:       Result Value   Glucose, UA 150 (*)    Hgb urine dipstick SMALL (*)    Squamous Epithelial / LPF 0-5 (*)    All other components within normal limits    EKG  EKG Interpretation None       Radiology No results found.  Procedures Procedures (including critical care time)  Medications Ordered in ED Medications - No data to display   Initial Impression / Assessment and Plan / ED Course  I have reviewed the triage vital signs and the nursing notes.  Pertinent labs & imaging results that were available during my care of the patient were reviewed by me and considered in my medical decision making (see chart for details).     Patient presents with urinary retention in the setting of recent  urinary tract infection. Currently on antibiotics. He is nontoxic. Vital signs reassuring. Afebrile. Foley catheter placed prior to my evaluation. Patient had greater than 500 mL of retention. Patient states he feels much better after Foley placement. Repeat urinalysis much improved from last hospitalization. Continue antibiotics as directed. Follow-up with urology. Patient was started on Flomax.  After history, exam, and medical workup I feel the patient has been appropriately medically screened and is safe for discharge home. Pertinent diagnoses were discussed with the patient. Patient was given return precautions.   Final Clinical Impressions(s) / ED Diagnoses   Final diagnoses:  Urinary retention    New Prescriptions New Prescriptions   TAMSULOSIN (FLOMAX) 0.4 MG CAPS CAPSULE    Take 1 capsule (0.4 mg total) by mouth daily.     Merryl Hacker, MD 08/27/16 [phone]

## 2016-08-27 NOTE — Discharge Instructions (Signed)
You were seen today for urinary retention. This is likely related to your prostate and recent urinary tract infection. Continue antibiotics as directed previously. He will be started on Flomax. Follow-up with urology for catheter removal in 4-5 days. If you develop any new or worsening symptoms you need to be reevaluated immediately.

## 2016-08-27 NOTE — ED Notes (Signed)
Bed: [id number] Expected date:  Expected time:  Means of arrival:  Comments: 61 yo M/ Urinary retention

## 2016-08-28 ENCOUNTER — Other Ambulatory Visit (HOSPITAL_COMMUNITY): Payer: Self-pay

## 2016-08-28 DIAGNOSIS — F200 Paranoid schizophrenia: Secondary | ICD-10-CM

## 2016-08-28 MED ORDER — HYDROXYZINE HCL 10 MG PO TABS: 10.0000 mg | ORAL_TABLET | Freq: Every day | ORAL | 0 refills | Status: DC

## 2016-08-29 ENCOUNTER — Encounter: Payer: Self-pay | Admitting: Internal Medicine

## 2016-08-29 ENCOUNTER — Ambulatory Visit (INDEPENDENT_AMBULATORY_CARE_PROVIDER_SITE_OTHER): Payer: Medicare Other | Admitting: Internal Medicine

## 2016-08-29 VITALS — BP 162/84 | HR 102 | Temp 98.2°F | Wt 232.7 lb

## 2016-08-29 DIAGNOSIS — I10 Essential (primary) hypertension: Secondary | ICD-10-CM | POA: Diagnosis not present

## 2016-08-29 DIAGNOSIS — N401 Enlarged prostate with lower urinary tract symptoms: Secondary | ICD-10-CM | POA: Diagnosis not present

## 2016-08-29 DIAGNOSIS — Z09 Encounter for follow-up examination after completed treatment for conditions other than malignant neoplasm: Secondary | ICD-10-CM

## 2016-08-29 DIAGNOSIS — Z79899 Other long term (current) drug therapy: Secondary | ICD-10-CM | POA: Diagnosis not present

## 2016-08-29 DIAGNOSIS — N3001 Acute cystitis with hematuria: Secondary | ICD-10-CM

## 2016-08-29 DIAGNOSIS — Z87440 Personal history of urinary (tract) infections: Secondary | ICD-10-CM

## 2016-08-29 DIAGNOSIS — R351 Nocturia: Secondary | ICD-10-CM | POA: Diagnosis not present

## 2016-08-29 DIAGNOSIS — Z960 Presence of urogenital implants: Secondary | ICD-10-CM

## 2016-08-29 DIAGNOSIS — Z9114 Patient's other noncompliance with medication regimen: Secondary | ICD-10-CM

## 2016-08-29 LAB — CULTURE, BLOOD (ROUTINE X 2)
CULTURE: NO GROWTH
Culture: NO GROWTH

## 2016-08-29 MED ORDER — LOSARTAN POTASSIUM 100 MG PO TABS: 100.0000 mg | ORAL_TABLET | Freq: Every day | ORAL | 1 refills | Status: DC

## 2016-08-29 NOTE — Progress Notes (Signed)
Internal Medicine Clinic Attending  Case discussed with Dr. Molt at the time of the visit.  We reviewed the resident's history and exam and pertinent patient test results.  I agree with the assessment, diagnosis, and plan of care documented in the resident's note. 

## 2016-08-29 NOTE — Assessment & Plan Note (Signed)
Patient reports compliance with course of Bactrim and chart review shows he did pick up this medication. He overall feels improved since admission however does complain of penile pain due to foley placement. He has an appointment Monday 09/04/16 to have the foley removed and also to follow-up on his BPH. Given his discomfort, have encouraged patient to contact his urologist office to see about moving up the appointment.  -Continue Bactrim to complete a 10 day course of antibiotic therapy for complicated UTI (male) -Appointment with urologist and PCP next week -Encouraged patient to call clinic or go to ED if he were to develop fevers, chills, back pain or other worsening symptoms

## 2016-08-29 NOTE — Patient Instructions (Signed)
Wayne Schmidt, it was a pleasure meeting you today. I'm glad you are doing better since discharge. Please continue taking your antibiotics as prescribed. Please call your urologist office to see if you can move up your appointment. Please continue taking your blood pressure medications. Also, please try to reduce the sodium in your diet! We will see you back here for your appointment with Dr. Dareen Piano

## 2016-08-29 NOTE — Progress Notes (Signed)
   CC: Hospital follow-up of UTI, HTN  HPI:  [name] is a 61 y.o. M with medical history as outlined below who presents to clinic today for hospital follow-up. He was admitted 2/21-2/24 with a 2 day history of fatigue, dysuria and polyuria attributable to a UTI as evidenced by infectious UA and culture growing pan-sensitive E.coli. He was treated with IV Ceftriaxone x 3 days followed by a 7 day course of Bactrim to complete a 10 day treatment. He is still on Bactrim. By time of discharge, his dysuria and fatigue had improved however he was still having increased urinary frequency. He does have a history of BPH and tobacco abuse. Would benefit from following PSA however this will likely be falsely elevated given his recent infection.  He presented to ED the following day with urinary retention. He endorsed polyuria however with scant urine output. Bladder scan showed > 510mL of urine and had a foley placed at that time. Still in place and has an appointment with his urologist next week as well as a PCP appointment.  Past Medical History:  Diagnosis Date  . Diabetes mellitus without complication ([place])   . High risk sexual behavior 10/2007  . Hypertension   . Microcytosis   . Schizophrenia ([place])   . Substance abuse    tobacco use   Review of Systems:  Review of Systems  Constitutional: Negative for chills, fever and malaise/fatigue.  Respiratory: Negative for cough and shortness of breath.   Cardiovascular: Negative for chest pain.  Gastrointestinal: Negative for abdominal pain, nausea and vomiting.  Genitourinary: Positive for dysuria. Negative for flank pain and hematuria.  Musculoskeletal: Negative for back pain and myalgias.  Skin: Negative for rash.  Neurological: Negative for dizziness and headaches.   Physical Exam: Physical Exam  Constitutional: He appears well-developed and well-nourished.  HENT:  Head: Normocephalic and atraumatic.  Cardiovascular: Normal rate,  regular rhythm and normal heart sounds.   Pulmonary/Chest: Effort normal and breath sounds normal. No respiratory distress.  Abdominal: Soft. Bowel sounds are normal. He exhibits no distension.  Skin: Skin is warm and dry. No rash noted. He is not diaphoretic.  Psychiatric: His affect is blunt. He is slowed.   Vitals:   08/29/16 0852  BP: (!) 162/84  Pulse: (!) 102  Temp: 98.2 F (36.8 C)  TempSrc: Oral  SpO2: 99%  Weight: 232 lb 11.2 oz (105.6 kg)   Assessment & Plan:   See Encounters Tab for problem based charting.  Patient discussed with Dr. Lynnae January

## 2016-08-29 NOTE — Assessment & Plan Note (Addendum)
Compliant with Doxazosin. Has appointment with his urologist office next week. Suspect patient would benefit from repeat PSA however will need to wait as could be falsely elevated in this patient with recent infection.   Ref. Range 11/22/2009 21:05  PSA Latest Ref Range: (0.10-4.00 ng/mL 4.30 (H)

## 2016-08-29 NOTE — Assessment & Plan Note (Signed)
BP Readings from Last 3 Encounters:  08/29/16 (!) 162/84  08/27/16 137/88  08/26/16 138/77    Lab Results  Component Value Date   NA 136 08/25/2016   K 4.1 08/25/2016   CREATININE 1.28 (H) 08/25/2016    Assessment: Blood pressure control:  Uncontrolled Progress toward BP goal:   Fleeting Comments: Endorses intermittent noncompliance with losartan 100 mg and also several high-sodium meals a week.   Plan: Medications: Encouraged strict compliance with Losartan 100 mg. Also on Doxazosin 4 mg daily. Other plans: Encouraged low sodium diet

## 2016-09-04 ENCOUNTER — Telehealth: Payer: Self-pay | Admitting: Internal Medicine

## 2016-09-04 DIAGNOSIS — R339 Retention of urine, unspecified: Secondary | ICD-10-CM | POA: Diagnosis not present

## 2016-09-04 NOTE — Telephone Encounter (Signed)
APT. REMINDER CALL, LMTCB °

## 2016-09-05 ENCOUNTER — Ambulatory Visit (INDEPENDENT_AMBULATORY_CARE_PROVIDER_SITE_OTHER): Payer: Medicare Other | Admitting: Internal Medicine

## 2016-09-05 ENCOUNTER — Encounter: Payer: Self-pay | Admitting: Internal Medicine

## 2016-09-05 VITALS — BP 113/76 | HR 96 | Temp 97.7°F | Ht 72.5 in | Wt 229.2 lb

## 2016-09-05 DIAGNOSIS — N401 Enlarged prostate with lower urinary tract symptoms: Secondary | ICD-10-CM | POA: Diagnosis not present

## 2016-09-05 DIAGNOSIS — Z87440 Personal history of urinary (tract) infections: Secondary | ICD-10-CM | POA: Diagnosis not present

## 2016-09-05 DIAGNOSIS — N3001 Acute cystitis with hematuria: Secondary | ICD-10-CM

## 2016-09-05 DIAGNOSIS — R351 Nocturia: Secondary | ICD-10-CM

## 2016-09-05 DIAGNOSIS — E119 Type 2 diabetes mellitus without complications: Secondary | ICD-10-CM

## 2016-09-05 DIAGNOSIS — I10 Essential (primary) hypertension: Secondary | ICD-10-CM

## 2016-09-05 DIAGNOSIS — F200 Paranoid schizophrenia: Secondary | ICD-10-CM

## 2016-09-05 DIAGNOSIS — Z79899 Other long term (current) drug therapy: Secondary | ICD-10-CM | POA: Diagnosis not present

## 2016-09-05 DIAGNOSIS — F17210 Nicotine dependence, cigarettes, uncomplicated: Secondary | ICD-10-CM

## 2016-09-05 LAB — GLUCOSE, CAPILLARY: Glucose-Capillary: 120 mg/dL — ABNORMAL HIGH (ref 65–99)

## 2016-09-05 NOTE — Assessment & Plan Note (Signed)
-   Patient had acute urinary retention after an admission for a UTI - He had a foley catheter placed in the ED and followed up with [facility] as an outpatient where he passed a voiding trial - He was started on tamsulosin in the ED in addition to his home doxazosin. At his follow up with urology they asked him to continue with the doxazosin alone and stop the tamsulosin - He will f/u urology this month again and also consider testing his PSA. He wants to speak to urology regarding the pros and cons of this

## 2016-09-05 NOTE — Assessment & Plan Note (Signed)
BP Readings from Last 3 Encounters:  09/05/16 113/76  08/29/16 (!) 162/84  08/27/16 137/88    Lab Results  Component Value Date   NA 136 08/25/2016   K 4.1 08/25/2016   CREATININE 1.28 (H) 08/25/2016    Assessment: Blood pressure control:  well controlled Progress toward BP goal:   at goal Comments: compliant with doxazosin and losartan  Plan: Medications:  continue current medications Educational resources provided: brochure (denies need ) Self management tools provided:   Other plans: Will check BMP on follow up

## 2016-09-05 NOTE — Assessment & Plan Note (Signed)
-   Symptoms are well controlled on his  Fluphenazine injections once every 2 weeks - He was also prescribed hydroxyzine at night by his psychiatrist - He was asked to hold this given his urinary retention - He would like to speak to his psychiatrist prior to resuming this medication who he will see in a couple of days for his injection

## 2016-09-05 NOTE — Patient Instructions (Signed)
- It was a pleasure seeing you today - Your urinary symptoms seemed to have improved now. Please follow up with your urologist as an outpatient - Your BP is well controlled. Keep up the good work! - We will recheck your A1C for your diabetes on your next visit. If it is high we may need to start you on a medication - Continue with diet and exercise  - Please speak to your psychiatrist about resuming the hydroxyzine  Diabetes Mellitus and Food It is important for you to manage your blood sugar (glucose) level. Your blood glucose level can be greatly affected by what you eat. Eating healthier foods in the appropriate amounts throughout the day at about the same time each day will help you control your blood glucose level. It can also help slow or prevent worsening of your diabetes mellitus. Healthy eating may even help you improve the level of your blood pressure and reach or maintain a healthy weight. General recommendations for healthful eating and cooking habits include:  Eating meals and snacks regularly. Avoid going long periods of time without eating to lose weight.  Eating a diet that consists mainly of plant-based foods, such as fruits, vegetables, nuts, legumes, and whole grains.  Using low-heat cooking methods, such as baking, instead of high-heat cooking methods, such as deep frying. Work with your dietitian to make sure you understand how to use the Nutrition Facts information on food labels. How can food affect me? Carbohydrates  Carbohydrates affect your blood glucose level more than any other type of food. Your dietitian will help you determine how many carbohydrates to eat at each meal and teach you how to count carbohydrates. Counting carbohydrates is important to keep your blood glucose at a healthy level, especially if you are using insulin or taking certain medicines for diabetes mellitus. Alcohol  Alcohol can cause sudden decreases in blood glucose (hypoglycemia), especially  if you use insulin or take certain medicines for diabetes mellitus. Hypoglycemia can be a life-threatening condition. Symptoms of hypoglycemia (sleepiness, dizziness, and disorientation) are similar to symptoms of having too much alcohol. If your health care provider has given you approval to drink alcohol, do so in moderation and use the following guidelines:  Women should not have more than one drink per day, and men should not have more than two drinks per day. One drink is equal to:  12 oz of beer.  5 oz of wine.  1 oz of hard liquor.  Do not drink on an empty stomach.  Keep yourself hydrated. Have water, diet soda, or unsweetened iced tea.  Regular soda, juice, and other mixers might contain a lot of carbohydrates and should be counted. What foods are not recommended? As you make food choices, it is important to remember that all foods are not the same. Some foods have fewer nutrients per serving than other foods, even though they might have the same number of calories or carbohydrates. It is difficult to get your body what it needs when you eat foods with fewer nutrients. Examples of foods that you should avoid that are high in calories and carbohydrates but low in nutrients include:  Trans fats (most processed foods list trans fats on the Nutrition Facts label).  Regular soda.  Juice.  Candy.  Sweets, such as cake, pie, doughnuts, and cookies.  Fried foods. What foods can I eat? Eat nutrient-rich foods, which will nourish your body and keep you healthy. The food you should eat also will depend on  several factors, including:  The calories you need.  The medicines you take.  Your weight.  Your blood glucose level.  Your blood pressure level.  Your cholesterol level. You should eat a variety of foods, including:  Protein.  Lean cuts of meat.  Proteins low in saturated fats, such as fish, egg whites, and beans. Avoid processed meats.  Fruits and  vegetables.  Fruits and vegetables that may help control blood glucose levels, such as apples, mangoes, and yams.  Dairy products.  Choose fat-free or low-fat dairy products, such as milk, yogurt, and cheese.  Grains, bread, pasta, and rice.  Choose whole grain products, such as multigrain bread, whole oats, and brown rice. These foods may help control blood pressure.  Fats.  Foods containing healthful fats, such as nuts, avocado, olive oil, canola oil, and fish. Does everyone with diabetes mellitus have the same meal plan? Because every person with diabetes mellitus is different, there is not one meal plan that works for everyone. It is very important that you meet with a dietitian who will help you create a meal plan that is just right for you. This information is not intended to replace advice given to you by your health care provider. Make sure you discuss any questions you have with your health care provider. Document Released: 03/16/2005 Document Revised: 11/25/2015 Document Reviewed: 05/16/2013 Elsevier Interactive Patient Education  2017 [employer].

## 2016-09-05 NOTE — Assessment & Plan Note (Signed)
-   Patient was recently admitted for E. Coli UTI and initially treated with ceftriaxone and transitioned to PO bactrim on discharge - He has completed the course of antibiotics and feels well currently. His urinary frequency and urgency have resolved.  - No further work up for now

## 2016-09-05 NOTE — Assessment & Plan Note (Signed)
-   A1C done in the hospital last week was 7.3 - Patient refuses medications at current time - States that he feels this is secondary to his poor diet and it will get better with diet and exercise - If elevated on follow up we will consider starting him on metformin

## 2016-09-05 NOTE — Progress Notes (Signed)
   Subjective:    Patient ID: Wayne Schmidt, male    DOB: 1956-02-17, 61 y.o.   MRN: [id number]  HPI  I seen and examined this patient. Patient is here for follow-up of his hypertension and diabetes.  Patient states he is compliant with his medications. He was recently admitted to the hospital for urinary tract infection and grew Escherichia coli sensitive to Bactrim in his urine. Patient was initially treated with ceftriaxone and transitioned to oral Bactrim on discharge. He is now off his antibiotics and his urinary symptoms have resolved. The day after he was discharged he developed urinary retention and was seen in the ED and had a Foley catheter placed. Since then is followed up with urology as had catheter removed. He said no problems urinating since then.  Patient was noted to have an A1c of 7.3 while in the hospital. He states this is secondary to his poor diet and thinks it will improve with diet and exercise alone. He does not want to try medication at this time.   Review of Systems  Constitutional: Negative.   HENT: Negative.   Respiratory: Negative.   Cardiovascular: Negative.   Gastrointestinal: Negative.   Genitourinary: Negative for decreased urine volume, difficulty urinating, dysuria and urgency.  Musculoskeletal: Negative.   Skin: Negative.   Neurological: Negative.   Psychiatric/Behavioral: Negative.        Objective:   Physical Exam  Constitutional: He is oriented to person, place, and time. He appears well-developed and well-nourished.  HENT:  Head: Normocephalic and atraumatic.  Mouth/Throat: No oropharyngeal exudate.  Neck: Neck supple.  Cardiovascular: Normal rate, regular rhythm and normal heart sounds.   Pulmonary/Chest: Effort normal and breath sounds normal. He has no wheezes. He has no rales.  Abdominal: Soft. Bowel sounds are normal. He exhibits no distension. There is no tenderness.  Musculoskeletal: Normal range of motion. He exhibits no edema.    Lymphadenopathy:    He has no cervical adenopathy.  Neurological: He is alert and oriented to person, place, and time.  Skin: Skin is warm. No erythema.  Psychiatric: He has a normal mood and affect. His behavior is normal.          Assessment & Plan:  Please see problem based charting for assessment and plan:

## 2016-09-07 ENCOUNTER — Ambulatory Visit (INDEPENDENT_AMBULATORY_CARE_PROVIDER_SITE_OTHER): Payer: 59

## 2016-09-07 VITALS — BP 132/82 | HR 100 | Ht 72.5 in | Wt 228.0 lb

## 2016-09-07 DIAGNOSIS — F200 Paranoid schizophrenia: Secondary | ICD-10-CM

## 2016-09-07 NOTE — Progress Notes (Signed)
Patient presented with flat affect, level and pleasant mood and stated feeling better after recent hospital admission for Pneumonia and then a resulting urinary tract infection.  Patient stated having a folly catheter for 8 days that was removed 09/04/16.  Patient states seeing PCP 09/05/16 and doing much better today.  Patient reported having last injection 08/24/16 while inpatient and verified through orders.  Patient denies any auditory or visual hallucinations, no suicidal or homicidal ideations and mood level.  Patient's due Prolixin Deconoate 37.5 mg IM injection prepared as ordered and given to patient in his right deltoid area and reviewed patient's recent admission with Dr. Adele Schilder to make aware and to verify continuation of current medications. Patient tolerated injection without any complaint of pain or discomfort and agreed to return in 2 weeks for next due injection.  Patient to call as needed and encouraged patient to contact his PCP if he had any set back or further urinary retention issues after recent pneumonia episode and patient agreed with plan.

## 2016-09-19 DIAGNOSIS — R339 Retention of urine, unspecified: Secondary | ICD-10-CM | POA: Diagnosis not present

## 2016-09-19 DIAGNOSIS — N401 Enlarged prostate with lower urinary tract symptoms: Secondary | ICD-10-CM | POA: Diagnosis not present

## 2016-09-21 ENCOUNTER — Encounter (HOSPITAL_COMMUNITY): Payer: Self-pay

## 2016-09-21 ENCOUNTER — Ambulatory Visit (INDEPENDENT_AMBULATORY_CARE_PROVIDER_SITE_OTHER): Payer: 59

## 2016-09-21 VITALS — BP 130/84 | HR 87 | Ht 72.5 in | Wt 232.0 lb

## 2016-09-21 DIAGNOSIS — F200 Paranoid schizophrenia: Secondary | ICD-10-CM

## 2016-09-21 MED ORDER — FLUPHENAZINE DECANOATE 25 MG/ML IJ SOLN
37.5000 mg | INTRAMUSCULAR | Status: DC
  Administered 2016-09-21 – 2017-01-25 (×10): 37.5 mg via INTRAMUSCULAR

## 2016-09-21 NOTE — Progress Notes (Signed)
Patient presented with flat affect, level and pleasant mood and denied any auditory or visual hallucinations, no suicidal or homicidal ideations and reports feeling better since recent inpatient admission for what they thought may be pneumonia.  Patient's BP improved as was 130/84 today and patient states taking medication daily and has stopped drinking any as use to be a few beers a week.  Patient's due Prolixin Decanoate 37.5 mg/1.47ml every 2 week IM injection prepared as ordered and given to patient in his left deltoid area.  Patient tolerated injection without complaint of pain or discomfort agreed to return in 2 weeks for next due injection.  Patient stable today with no complaints and will call if any problems noted prior to next appointment.  Patient brought in Prolixin Decanoate 125mg /45ml vial for injection administration.

## 2016-10-05 ENCOUNTER — Encounter (HOSPITAL_COMMUNITY): Payer: Self-pay

## 2016-10-05 ENCOUNTER — Ambulatory Visit (INDEPENDENT_AMBULATORY_CARE_PROVIDER_SITE_OTHER): Payer: 59

## 2016-10-05 VITALS — BP 138/82 | HR 81 | Ht 72.5 in | Wt 235.0 lb

## 2016-10-05 DIAGNOSIS — F200 Paranoid schizophrenia: Secondary | ICD-10-CM | POA: Diagnosis not present

## 2016-10-05 NOTE — Progress Notes (Signed)
Patient presented with flat affect, level mood and denied any auditory or visual hallucinations, denied any suicidal or homicidal ideations and reported no complaints today.  Patient reported no lasting problems from recent pneumonia and denied any current shortness or breath or lung concerns, O2 = 95% today.  Patient still smoking daily without plan to quit.  Patient's due Prolixin Decanoate 37.5mg /1.54ml IM injection prepared and given to patient as ordered every 2 weeks. Patient tolerated injection without complaint of pain or discomfort and agreed to return in 2 weeks for next injection.  Patient to call if any problems with symptoms prior to next appointment and left facility without reported concerns this date.   Patient verified medications and scheduled follow up for next injection 10/19/16.

## 2016-10-19 ENCOUNTER — Ambulatory Visit (INDEPENDENT_AMBULATORY_CARE_PROVIDER_SITE_OTHER): Payer: 59

## 2016-10-19 ENCOUNTER — Encounter (HOSPITAL_COMMUNITY): Payer: Self-pay

## 2016-10-19 DIAGNOSIS — F200 Paranoid schizophrenia: Secondary | ICD-10-CM | POA: Diagnosis not present

## 2016-10-19 DIAGNOSIS — Z125 Encounter for screening for malignant neoplasm of prostate: Secondary | ICD-10-CM | POA: Diagnosis not present

## 2016-10-19 NOTE — Progress Notes (Signed)
Patient presents today with flat affect and appropriate mood for his bi-weekly injection of prolixin. Pateint reports he is doing well, no SI or HI and no hallucinations. Patient's due Prolixin Decanoate 37.5mg /1.61ml IM injection prepared and given to patient in the left deltoid as ordered. Patient tolerated injection without complaint of pain or discomfort and agreed to return in 2 weeks for next injection.  Patient to call if any problems with symptoms prior to next appointment and left facility without reported concerns this date.

## 2016-10-25 DIAGNOSIS — N401 Enlarged prostate with lower urinary tract symptoms: Secondary | ICD-10-CM | POA: Diagnosis not present

## 2016-10-31 ENCOUNTER — Ambulatory Visit (INDEPENDENT_AMBULATORY_CARE_PROVIDER_SITE_OTHER): Payer: 59 | Admitting: Psychiatry

## 2016-10-31 ENCOUNTER — Encounter (HOSPITAL_COMMUNITY): Payer: Self-pay | Admitting: Psychiatry

## 2016-10-31 ENCOUNTER — Other Ambulatory Visit (HOSPITAL_COMMUNITY): Payer: Self-pay

## 2016-10-31 DIAGNOSIS — Z79899 Other long term (current) drug therapy: Secondary | ICD-10-CM | POA: Diagnosis not present

## 2016-10-31 DIAGNOSIS — I10 Essential (primary) hypertension: Secondary | ICD-10-CM

## 2016-10-31 DIAGNOSIS — Z813 Family history of other psychoactive substance abuse and dependence: Secondary | ICD-10-CM

## 2016-10-31 DIAGNOSIS — F200 Paranoid schizophrenia: Secondary | ICD-10-CM

## 2016-10-31 DIAGNOSIS — F17210 Nicotine dependence, cigarettes, uncomplicated: Secondary | ICD-10-CM

## 2016-10-31 MED ORDER — HYDROXYZINE HCL 10 MG PO TABS: 10.0000 mg | ORAL_TABLET | Freq: Every day | ORAL | 0 refills | Status: DC

## 2016-10-31 NOTE — Progress Notes (Signed)
[facility] MD/PA/NP OP Progress Note  10/31/2016 10:53 AM Wayne Schmidt  MRN:  [id number]  Chief Complaint:  Subjective:  I am doing fine.  I'm sleeping good.  HPI: Wayne Schmidt came for his follow-up appointment.  He is getting Prolixin injection every 2 weeks and denies any side effects.  He sleeping good with Vistaril.  He was hospitalized in February because of UTI and recently seen his primary care physician and neurologist.  He denies any hallucination or any irritability.  Sometime he ruminates about his illness and admitted some time talk to himself but denies any suicidal thoughts or homicidal thought.  He denies any feeling of hopelessness or worthlessness.  He denies any agitation or any anger.  He understand that he has chronic illness and some time he gets paranoid about people but he is able to distract himself.  He really liked Prolixin injection 37.5 mg which he is getting every 2 weeks.  He has mild tremors but he does not want to take Cogentin.  He sleeps good with Vistaril 10 mg.  Denies drinking alcohol or using any illegal substances.  He continues to enjoy company with his grandkids.  He is trying to control his diet.  His last hemoglobin A1c was 7.3.  His energy level is fair.  His appetite is okay.  His vital signs are stable.  Visit Diagnosis:    ICD-9-CM ICD-10-CM   1. Paranoid schizophrenia, subchronic condition ([place]) 295.31 F20.0 hydrOXYzine (ATARAX/VISTARIL) 10 MG tablet    Past Psychiatric History:  Patient has a long history of psychiatric illness with multiple hospitalization.  He recall at least 16-20 times hospitalized.  He was [facility] for [facility] hallucination and paranoia.  In the past he had tried Haldol and Thorazine but endorse that he was allergic to these medication.  He is taking Prolixin injection for past 30 years.  He has at least 2 psychiatric admission because of suicidal attempt when he took overdose on his medication.  Past Medical History:   Past Medical History:  Diagnosis Date  . Diabetes mellitus without complication ([place])   . High risk sexual behavior 10/2007  . Hypertension   . Microcytosis   . Schizophrenia ([place])   . Substance abuse    tobacco use    Past Surgical History:  Procedure Laterality Date  . LAMINECTOMY     by Dr Rolin Barry    Family Psychiatric History: Reviewed.  Family History:  Family History  Problem Relation Age of Onset  . Diabetes Mother   . Hypertension Mother   . Lupus Sister   . Sickle cell anemia Brother   . Drug abuse Brother     Social History:  Social History   Social History  . Marital status: Legally Separated    Spouse name: N/A  . Number of children: N/A  . Years of education: N/A   Social History Main Topics  . Smoking status: Current Every Day Smoker    Packs/day: 1.00    Years: 38.00    Types: Cigarettes  . Smokeless tobacco: Never Used     Comment: cutting back.  Has tried  . Alcohol use No     Comment: Occasional use   . Drug use: No     Comment: h/o remote cocaine use-denies present use  . Sexual activity: Not Currently   Other Topics Concern  . None   Social History Narrative   Patient given diabetes card 07/07/2010    Allergies:  Allergies  Allergen  Reactions  . Ace Inhibitors Cough  . Atorvastatin Other (See Comments)    Dizzy / Faint  . Chlorpromazine Hcl Other (See Comments)    Passed out   . Simvastatin Other (See Comments)    Dizzy / Faint    Metabolic Disorder Labs: Lab Results  Component Value Date   HGBA1C 7.3 (H) 08/24/2016   MPG 163 08/24/2016   No results found for: PROLACTIN Lab Results  Component Value Date   CHOL 166 01/11/2016   TRIG 55 01/11/2016   HDL 68 01/11/2016   CHOLHDL 2.4 01/11/2016   VLDL 11 04/01/2014   LDLCALC 87 01/11/2016   LDLCALC 91 04/01/2014     Current Medications: Current Outpatient Prescriptions  Medication Sig Dispense Refill  . doxazosin (CARDURA) 4 MG tablet Take 1 tablet (4 mg total) by  mouth daily. 90 tablet 0  . fluPHENAZine decanoate (PROLIXIN) 25 MG/ML injection Inject 1.5 mLs (37.5 mg total) into the muscle every 14 (fourteen) days. 5 mL 2  . hydroxypropyl methylcellulose / hypromellose (ISOPTO TEARS / GONIOVISC) 2.5 % ophthalmic solution Place 2 drops into both eyes daily as needed for dry eyes.    . hydrOXYzine (ATARAX/VISTARIL) 10 MG tablet Take 1 tablet (10 mg total) by mouth at bedtime. 90 tablet 0  . losartan (COZAAR) 100 MG tablet Take 1 tablet (100 mg total) by mouth daily. 90 tablet 1  . pravastatin (PRAVACHOL) 40 MG tablet Take 1 tablet (40 mg total) by mouth daily. 90 tablet 3  . tamsulosin (FLOMAX) 0.4 MG CAPS capsule Take 0.4 mg by mouth daily.     Current Facility-Administered Medications  Medication Dose Route Frequency Provider Last Rate Last Dose  . fluPHENAZine decanoate (PROLIXIN) injection 37.5 mg  37.5 mg Intramuscular Q14 Days [name], MD   37.5 mg at 10/19/16 1012    Neurologic: Headache: No Seizure: No Paresthesias: No  Musculoskeletal: Strength & Muscle Tone: within normal limits Gait & Station: normal Patient leans: N/A  Psychiatric Specialty Exam: ROS  Blood pressure 140/80, pulse (!) 104, height 6' 0.5" (1.842 m), weight 232 lb 3.2 oz (105.3 kg).Body mass index is 31.06 kg/m.  General Appearance: Casual  Eye Contact:  Fair  Speech:  Slow  Volume:  Decreased  Mood:  Euthymic  Affect:  Restricted  Thought Process:  Goal Directed  Orientation:  Full (Time, Place, and Person)  Thought Content: Rumination   Suicidal Thoughts:  No  Homicidal Thoughts:  No  Memory:  Immediate;   Fair Recent;   Fair Remote;   Fair  Judgement:  Good  Insight:  Good  Psychomotor Activity:  Decreased and Tremor  Concentration:  Concentration: Fair and Attention Span: Fair  Recall:  Good  Fund of Knowledge: Good  Language: Good  Akathisia:  No  Handed:  Right  AIMS (if indicated):  0  Assets:  Communication Skills Desire for  Improvement Housing Resilience  ADL's:  Intact  Cognition: WNL  Sleep:  adequate    Assessment: Schizophrenia chronic paranoid type  Plan: Patient is a stable on his current medication.  I will continue Prolixin 37.5 mg intramuscular every 2 weeks and Vistaril 10 mg at bedtime.  Discussed medication side effects and benefits.  Recommended to call us back if he has any question or any concern.  Follow-up in 3 months  Jaiyla Granados T., MD 10/31/2016, 10:53 AM

## 2016-11-02 ENCOUNTER — Ambulatory Visit (INDEPENDENT_AMBULATORY_CARE_PROVIDER_SITE_OTHER): Payer: 59

## 2016-11-02 DIAGNOSIS — F200 Paranoid schizophrenia: Secondary | ICD-10-CM

## 2016-11-02 MED ORDER — FLUPHENAZINE DECANOATE 25 MG/ML IJ SOLN: 37.5000 mg | INTRAMUSCULAR | 2 refills | Status: DC

## 2016-11-02 NOTE — Progress Notes (Signed)
Patient presents today with flat affect and appropriate mood for his bi-weekly injection of prolixin. Pateint reports he is doing well, no SI or HI and no hallucinations. Patient's due Prolixin Decanoate 37.5mg /1.43ml IM injection prepared and given to patient in the right deltoid as ordered. Patient tolerated injection without complaint of pain or discomfort and agreed to return in 2 weeks for next injection. Patient to call if any problems with symptoms prior to next appointment and left facility without reported concerns this date.

## 2016-11-16 ENCOUNTER — Ambulatory Visit (INDEPENDENT_AMBULATORY_CARE_PROVIDER_SITE_OTHER): Payer: 59

## 2016-11-16 VITALS — BP 130/86 | HR 90 | Ht 72.5 in | Wt 232.0 lb

## 2016-11-16 DIAGNOSIS — F200 Paranoid schizophrenia: Secondary | ICD-10-CM | POA: Diagnosis not present

## 2016-11-16 NOTE — Progress Notes (Signed)
Patient presented with flat affect, level mood and denied any current auditory or visual hallucinations, no suicidal or homicidal ideations and denial of any problems or symptoms at this time.  Patient reported no problems with current medications and due Prolixin Decanoate 37.5 mg/1.5 ml injection prepared as ordered and given to patient in his left deltoid area.  Patient tolerated injection without complaint of any pain or discomfort and agreed to return in 2 weeks for next due injection.  Patient to call as needed if any problems with symptoms worsen prior to 2 week appointment.

## 2016-11-25 ENCOUNTER — Other Ambulatory Visit: Payer: Self-pay | Admitting: Internal Medicine

## 2016-11-25 ENCOUNTER — Other Ambulatory Visit (HOSPITAL_COMMUNITY): Payer: Self-pay | Admitting: Psychiatry

## 2016-11-25 DIAGNOSIS — F200 Paranoid schizophrenia: Secondary | ICD-10-CM

## 2016-11-25 DIAGNOSIS — I10 Essential (primary) hypertension: Secondary | ICD-10-CM

## 2016-11-30 ENCOUNTER — Other Ambulatory Visit (HOSPITAL_COMMUNITY): Payer: Self-pay

## 2016-11-30 ENCOUNTER — Ambulatory Visit (INDEPENDENT_AMBULATORY_CARE_PROVIDER_SITE_OTHER): Payer: 59

## 2016-11-30 DIAGNOSIS — F200 Paranoid schizophrenia: Secondary | ICD-10-CM

## 2016-11-30 MED ORDER — HYDROXYZINE HCL 10 MG PO TABS: 10.0000 mg | ORAL_TABLET | Freq: Every day | ORAL | 0 refills | Status: DC

## 2016-11-30 NOTE — Progress Notes (Signed)
Patient presented with flat affect, level mood and denied any current auditory or visual hallucinations, no suicidal or homicidal ideations and denial of any problems or symptoms at this time.  Patient reported no problems with current medications and due Prolixin Decanoate 37.5 mg/1.5 ml injection prepared as ordered and given to patient in his right deltoid area.  Patient tolerated injection without complaint of any pain or discomfort and agreed to return in 2 weeks for next due injection.  Patient to call as needed if any problems with symptoms worsen prior to 2 week appointment.

## 2016-12-03 ENCOUNTER — Other Ambulatory Visit: Payer: Self-pay | Admitting: Internal Medicine

## 2016-12-12 ENCOUNTER — Ambulatory Visit (HOSPITAL_COMMUNITY)
Admission: RE | Admit: 2016-12-12 | Discharge: 2016-12-12 | Disposition: A | Discharge status: Home / Self Care | Payer: Medicare Other | Source: Ambulatory Visit | Attending: Internal Medicine | Admitting: Internal Medicine

## 2016-12-12 ENCOUNTER — Ambulatory Visit (INDEPENDENT_AMBULATORY_CARE_PROVIDER_SITE_OTHER): Payer: Medicare Other | Admitting: Internal Medicine

## 2016-12-12 ENCOUNTER — Encounter: Payer: Self-pay | Admitting: Internal Medicine

## 2016-12-12 VITALS — BP 157/80 | HR 76 | Temp 98.0°F | Ht 72.0 in | Wt 238.1 lb

## 2016-12-12 DIAGNOSIS — E119 Type 2 diabetes mellitus without complications: Secondary | ICD-10-CM

## 2016-12-12 DIAGNOSIS — F17210 Nicotine dependence, cigarettes, uncomplicated: Secondary | ICD-10-CM

## 2016-12-12 DIAGNOSIS — E784 Other hyperlipidemia: Secondary | ICD-10-CM | POA: Diagnosis not present

## 2016-12-12 DIAGNOSIS — Z79899 Other long term (current) drug therapy: Secondary | ICD-10-CM | POA: Diagnosis not present

## 2016-12-12 DIAGNOSIS — R5383 Other fatigue: Secondary | ICD-10-CM | POA: Diagnosis not present

## 2016-12-12 DIAGNOSIS — I517 Cardiomegaly: Secondary | ICD-10-CM | POA: Diagnosis not present

## 2016-12-12 DIAGNOSIS — R0789 Other chest pain: Secondary | ICD-10-CM | POA: Diagnosis not present

## 2016-12-12 DIAGNOSIS — E785 Hyperlipidemia, unspecified: Secondary | ICD-10-CM | POA: Diagnosis not present

## 2016-12-12 DIAGNOSIS — R079 Chest pain, unspecified: Secondary | ICD-10-CM

## 2016-12-12 DIAGNOSIS — Z0000 Encounter for general adult medical examination without abnormal findings: Secondary | ICD-10-CM

## 2016-12-12 DIAGNOSIS — E7849 Other hyperlipidemia: Secondary | ICD-10-CM

## 2016-12-12 DIAGNOSIS — R351 Nocturia: Secondary | ICD-10-CM | POA: Diagnosis not present

## 2016-12-12 DIAGNOSIS — F17200 Nicotine dependence, unspecified, uncomplicated: Secondary | ICD-10-CM

## 2016-12-12 DIAGNOSIS — N401 Enlarged prostate with lower urinary tract symptoms: Secondary | ICD-10-CM

## 2016-12-12 DIAGNOSIS — I10 Essential (primary) hypertension: Secondary | ICD-10-CM | POA: Diagnosis not present

## 2016-12-12 LAB — POCT GLYCOSYLATED HEMOGLOBIN (HGB A1C): Hemoglobin A1C: 7.2

## 2016-12-12 LAB — GLUCOSE, CAPILLARY: GLUCOSE-CAPILLARY: 144 mg/dL — AB (ref 65–99)

## 2016-12-12 NOTE — Patient Instructions (Signed)
-   It was a pleasure seeing you today - We will check an EKG today - I will attempt to get your colonoscopy records from Dr. Benson Norway - Your BP is high today probably because you have not taken your medication. Please take your medication daily - We will check some bloodwork today - Please follow up in 3 months

## 2016-12-12 NOTE — Assessment & Plan Note (Signed)
BP Readings from Last 3 Encounters:  12/12/16 (!) 157/80  11/16/16 130/86  10/31/16 140/80    Lab Results  Component Value Date   NA 136 08/25/2016   K 4.1 08/25/2016   CREATININE 1.28 (H) 08/25/2016    Assessment: Blood pressure control:  fair Progress toward BP goal:   deteriorated Comments: patient did not take his meds this AM  Plan: Medications:  continue with cozaar and doxazosin Educational resources provided: brochure (denies need ) Self management tools provided:   Other plans: will check BMP

## 2016-12-12 NOTE — Assessment & Plan Note (Signed)
Lab Results  Component Value Date   HGBA1C 7.2 12/12/2016   HGBA1C 7.3 (H) 08/24/2016   HGBA1C 7.1 04/11/2016     Assessment: Diabetes control:  fair Progress toward A1C goal:   improved Comments: Patient refusing medication at this time  Plan: Medications:  not on any medication. Diet controlled Home glucose monitoring: Frequency:   Timing:   Instruction/counseling given: discussed the need for weight loss and discussed diet Educational resources provided: brochure (denies need ) Self management tools provided:   Other plans: will check BMP

## 2016-12-12 NOTE — Assessment & Plan Note (Signed)
-   Will continue with pravastatin - Check lipid profile today

## 2016-12-12 NOTE — Assessment & Plan Note (Signed)
-   Patient complains of fatigue and occasional lightheadedness. He states this started after his follow up with psych and was started on atarax. - Will check CBC, TSH - Encouraged him to follow up with psych to see if his regimen can be modified

## 2016-12-12 NOTE — Assessment & Plan Note (Signed)
-   Patient states he got his colonoscopy 2 years ago with Dr. Benson Norway - Will obtain records from Dr. Ulyses Amor office

## 2016-12-12 NOTE — Progress Notes (Signed)
   Subjective:    Patient ID: Wayne Schmidt, male    DOB: Nov 26, 1955, 61 y.o.   MRN: [id number]  HPI I have seen and examined this patient. He is here for routine follow up of his HTN and DM.    Review of Systems  Constitutional: Negative.   HENT: Negative.   Respiratory: Negative.   Cardiovascular: Positive for chest pain.       Complains of intermittent dull left sided chest pain  Gastrointestinal: Negative.   Musculoskeletal: Negative.   Skin: Negative.   Neurological: Positive for light-headedness. Negative for tremors and syncope.  Psychiatric/Behavioral: Negative.        Objective:   Physical Exam  Constitutional: He is oriented to person, place, and time. He appears well-developed and well-nourished.  HENT:  Head: Normocephalic and atraumatic.  Mouth/Throat: No oropharyngeal exudate.  Neck: Neck supple.  Cardiovascular: Normal rate, regular rhythm and normal heart sounds.   Pulmonary/Chest: Effort normal and breath sounds normal. He has no wheezes. He has no rales.  Abdominal: Soft. Bowel sounds are normal. He exhibits no distension. There is no tenderness.  Musculoskeletal: Normal range of motion. He exhibits no edema.  Lymphadenopathy:    He has no cervical adenopathy.  Neurological: He is alert and oriented to person, place, and time.  Skin: Skin is warm. No rash noted. No erythema.  Psychiatric: He has a normal mood and affect. His behavior is normal.          Assessment & Plan:  Please see problem based charting for assessment and plan:

## 2016-12-12 NOTE — Assessment & Plan Note (Signed)
-   Patient does not want to discuss quitting at this time. States he is already 25 and wants to continue at this time - Will attempt to discuss on follow up

## 2016-12-12 NOTE — Assessment & Plan Note (Signed)
-   Patient complained of mild, intermittent, dull, left sided chest pain which is non radiating, non exertional and lasts less than a minute at a time - EKG done today with no acute ST/T wave changes - Chest pain is very atypical for ACS and I doubt this is the etiology. I did encourage him to follow up in the ED if he gets recurrent CP - If this persists would consider referring him to cardio for a possible stress test given his risk factors

## 2016-12-12 NOTE — Assessment & Plan Note (Signed)
-   Patient is compliant with tamsulosin but states he occasionally feels lightheaded after taking this medication - Encouraged him to follow up with his urologist for further management

## 2016-12-13 ENCOUNTER — Telehealth: Payer: Self-pay | Admitting: Internal Medicine

## 2016-12-13 ENCOUNTER — Ambulatory Visit (HOSPITAL_COMMUNITY): Payer: Self-pay

## 2016-12-13 LAB — CBC WITH DIFFERENTIAL/PLATELET
Basophils Absolute: 0 10*3/uL (ref 0.0–0.2)
Basos: 0 %
EOS (ABSOLUTE): 0.1 10*3/uL (ref 0.0–0.4)
Eos: 1 %
Hematocrit: 45 % (ref 37.5–51.0)
Hemoglobin: 14.6 g/dL (ref 13.0–17.7)
IMMATURE GRANULOCYTES: 0 %
Immature Grans (Abs): 0 10*3/uL (ref 0.0–0.1)
Lymphocytes Absolute: 3.7 10*3/uL — ABNORMAL HIGH (ref 0.7–3.1)
Lymphs: 51 %
MCH: 24.5 pg — ABNORMAL LOW (ref 26.6–33.0)
MCHC: 32.4 g/dL (ref 31.5–35.7)
MCV: 76 fL — ABNORMAL LOW (ref 79–97)
MONOS ABS: 0.6 10*3/uL (ref 0.1–0.9)
Monocytes: 9 %
NEUTROS PCT: 39 %
Neutrophils Absolute: 2.9 10*3/uL (ref 1.4–7.0)
PLATELETS: 152 10*3/uL (ref 150–379)
RBC: 5.95 x10E6/uL — AB (ref 4.14–5.80)
RDW: 16.8 % — AB (ref 12.3–15.4)
WBC: 7.3 10*3/uL (ref 3.4–10.8)

## 2016-12-13 LAB — BMP8+ANION GAP
ANION GAP: 14 mmol/L (ref 10.0–18.0)
BUN/Creatinine Ratio: 10 (ref 10–24)
BUN: 13 mg/dL (ref 8–27)
CHLORIDE: 104 mmol/L (ref 96–106)
CO2: 23 mmol/L (ref 20–29)
CREATININE: 1.25 mg/dL (ref 0.76–1.27)
Calcium: 8.9 mg/dL (ref 8.6–10.2)
GFR calc Af Amer: 71 mL/min/{1.73_m2} (ref >59)
GFR calc non Af Amer: 61 mL/min/{1.73_m2} (ref >59)
GLUCOSE: 141 mg/dL — AB (ref 65–99)
Potassium: 4.5 mmol/L (ref 3.5–5.2)
SODIUM: 141 mmol/L (ref 134–144)

## 2016-12-13 LAB — LIPID PANEL
CHOLESTEROL TOTAL: 159 mg/dL (ref 100–199)
Chol/HDL Ratio: 2.6 ratio (ref 0.0–5.0)
HDL: 62 mg/dL (ref >39)
LDL CALC: 85 mg/dL (ref 0–99)
Triglycerides: 61 mg/dL (ref 0–149)
VLDL Cholesterol Cal: 12 mg/dL (ref 5–40)

## 2016-12-13 LAB — TSH: TSH: 1.47 u[IU]/mL (ref 0.450–4.500)

## 2016-12-13 NOTE — Telephone Encounter (Signed)
Called patient to discuss the results of his bloodwork. The lipid panel was at goal and his BMP was wnl. He was noted to be mildly microcytic on his CBC but he was not anemic. His TSH was wnl as well. No further work up for now. Patient expressed understanding and is in agreement with plan.

## 2016-12-14 ENCOUNTER — Ambulatory Visit (HOSPITAL_COMMUNITY): Payer: 59

## 2016-12-14 DIAGNOSIS — F200 Paranoid schizophrenia: Secondary | ICD-10-CM | POA: Diagnosis not present

## 2016-12-14 NOTE — Progress Notes (Signed)
Patient presented with flat affect, level mood and denied any current auditory or visual hallucinations, no suicidal or homicidal ideations and denial of any problems or symptoms at this time. Patient reported no problems with current medications and due Prolixin Decanoate 37.5 mg/1.5 ml injection prepared as ordered and given to patient in his right deltoid area. This was the same side as the last injection, but patient was complaining of some soreness in the left arm - it comes and goes. Patient tolerated injection without complaint of any pain or discomfort and agreed to return in 2 weeks for next due injection. Patient to call as needed if any problems with symptoms worsen prior to 2 week appointment.

## 2016-12-27 DIAGNOSIS — R339 Retention of urine, unspecified: Secondary | ICD-10-CM | POA: Diagnosis not present

## 2016-12-27 DIAGNOSIS — N401 Enlarged prostate with lower urinary tract symptoms: Secondary | ICD-10-CM | POA: Diagnosis not present

## 2016-12-28 ENCOUNTER — Ambulatory Visit (INDEPENDENT_AMBULATORY_CARE_PROVIDER_SITE_OTHER): Payer: 59

## 2016-12-28 DIAGNOSIS — F200 Paranoid schizophrenia: Secondary | ICD-10-CM | POA: Diagnosis not present

## 2016-12-28 NOTE — Progress Notes (Signed)
    Patient presented with flat affect, level mood and denied any current auditory or visual hallucinations, no suicidal or homicidal ideations and denial of any problems or symptoms at this time. Patient reported no problems with current medications and due Prolixin Decanoate 37.5 mg/1.5 ml injection prepared as ordered and given to patient in his leftdeltoid area. Patient tolerated injection without complaint of any pain or discomfort and agreed to return in 2 weeks for next due injection. Patient to call as needed if any problems with symptoms worsen prior to 2 week appointment.

## 2017-01-03 ENCOUNTER — Other Ambulatory Visit (HOSPITAL_COMMUNITY): Payer: Self-pay | Admitting: Psychiatry

## 2017-01-03 DIAGNOSIS — F200 Paranoid schizophrenia: Secondary | ICD-10-CM

## 2017-01-11 ENCOUNTER — Ambulatory Visit (INDEPENDENT_AMBULATORY_CARE_PROVIDER_SITE_OTHER): Payer: 59

## 2017-01-11 DIAGNOSIS — F200 Paranoid schizophrenia: Secondary | ICD-10-CM

## 2017-01-11 NOTE — Progress Notes (Signed)
Patient presented with flat affect, level mood and denied any current auditory or visual hallucinations, no suicidal or homicidal ideations and denial of any problems or symptoms at this time. Patient reported no problems with current medications and due Prolixin Decanoate 37.5 mg/1.5 ml injection prepared as ordered and given to patient in his rightdeltoid area.Patient tolerated injection without complaint of any pain or discomfort and agreed to return in 2 weeks for next due injection. Patient to call as needed if any problems with symptoms worsen prior to 2 week appointment.

## 2017-01-25 ENCOUNTER — Ambulatory Visit (INDEPENDENT_AMBULATORY_CARE_PROVIDER_SITE_OTHER): Payer: 59

## 2017-01-25 DIAGNOSIS — F200 Paranoid schizophrenia: Secondary | ICD-10-CM | POA: Diagnosis not present

## 2017-01-25 NOTE — Progress Notes (Signed)
Patient presented with flat affect, level mood and denied any current auditory or visual hallucinations, no suicidal or homicidal ideations and denial of any problems or symptoms at this time. Patient reported no problems with current medications and due Prolixin Decanoate 37.5 mg/1.5 ml injection prepared as ordered and given to patient in his leftdeltoid area.Patient tolerated injection without complaint of any pain or discomfort and agreed to return in 2 weeks for next due injection. Patient to call as needed if any problems with symptoms worsen prior to 2 week appointment.

## 2017-01-31 ENCOUNTER — Ambulatory Visit (INDEPENDENT_AMBULATORY_CARE_PROVIDER_SITE_OTHER): Payer: 59 | Admitting: Psychiatry

## 2017-01-31 ENCOUNTER — Encounter (HOSPITAL_COMMUNITY): Payer: Self-pay | Admitting: Psychiatry

## 2017-01-31 DIAGNOSIS — Z813 Family history of other psychoactive substance abuse and dependence: Secondary | ICD-10-CM | POA: Diagnosis not present

## 2017-01-31 DIAGNOSIS — F17210 Nicotine dependence, cigarettes, uncomplicated: Secondary | ICD-10-CM

## 2017-01-31 DIAGNOSIS — F200 Paranoid schizophrenia: Secondary | ICD-10-CM

## 2017-01-31 NOTE — Progress Notes (Signed)
[facility] MD/PA/NP OP Progress Note  01/31/2017 10:49 AM [name]  MRN:  [id number]  Chief Complaint:  Subjective:  I am doing good.  I'm getting injections.  Sometimes I don't take sleep medication.  HPI: Wayne Schmidt came for his follow-up appointment.  He is getting Nixon injection 37.5 mg intramuscular every 2 weeks.  He denies any paranoia, hallucination, irritability or any anger.  Recently he seen his primary care physician for physical checkup and blood work.  His hemoglobin A1c 7.2.  He endorsed some time feeling grogginess and lightheadedness in the morning. He is only taking 10 mg of Atarax which is helping his tremors.  However there are nights when he has not taken Atarax and still sleep better.  Patient denies drinking alcohol or using any illegal substances.  His appetite is okay.  His energy level is good.  His thinking is clear and organized.  He likes to continue current medication .    Visit Diagnosis:    ICD-10-CM   1. Paranoid schizophrenia, subchronic condition ([name]) F20.0     Past Psychiatric History: Reviewed.   Patient has a long history of psychiatric illness with multiple hospitalization. He recall at least 16-20 times hospitalized. He was admitted [facility] for psychosis hallucination and paranoia. In the past he had tried Haldol and Thorazine but endorse that he was allergic to these medication. He is taking Prolixin injection for past 30 years. He has at least 2 psychiatric admission because of suicidal attempt when he took overdose on his medication.  Past Medical History:  Past Medical History:  Diagnosis Date  . Diabetes mellitus without complication ([place])   . High risk sexual behavior 10/2007  . Hypertension   . Microcytosis   . Schizophrenia ([name])   . Substance abuse    tobacco use    Past Surgical History:  Procedure Laterality Date  . LAMINECTOMY     by Dr Rolin Barry    Family Psychiatric History: Reviewed .   Family History:  Family History   Problem Relation Age of Onset  . Diabetes Mother   . Hypertension Mother   . Lupus Sister   . Sickle cell anemia Brother   . Drug abuse Brother     Social History:  Social History   Social History  . Marital status: Legally Separated    Spouse name: N/A  . Number of children: N/A  . Years of education: N/A   Social History Main Topics  . Smoking status: Current Every Day Smoker    Packs/day: 1.50    Years: 38.00    Types: Cigarettes  . Smokeless tobacco: Never Used     Comment:   Has tried  . Alcohol use No     Comment: Occasional use   . Drug use: No     Comment: h/o remote cocaine use-denies present use  . Sexual activity: Not Currently   Other Topics Concern  . None   Social History Narrative   Patient given diabetes card 07/07/2010    Allergies:  Allergies  Allergen Reactions  . Ace Inhibitors Cough  . Atorvastatin Other (See Comments)    Dizzy / Faint  . Chlorpromazine Hcl Other (See Comments)    Passed out   . Simvastatin Other (See Comments)    Dizzy / Faint    Metabolic Disorder Labs: Lab Results  Component Value Date   HGBA1C 7.2 12/12/2016   MPG 163 08/24/2016   No results found for: PROLACTIN Lab Results  Component  Value Date   CHOL 159 12/12/2016   TRIG 61 12/12/2016   HDL 62 12/12/2016   CHOLHDL 2.6 12/12/2016   VLDL 11 04/01/2014   LDLCALC 85 12/12/2016   LDLCALC 87 01/11/2016     Current Medications: Current Outpatient Prescriptions  Medication Sig Dispense Refill  . doxazosin (CARDURA) 4 MG tablet TAKE 1 TABLET BY MOUTH EVERY DAY 90 tablet 1  . fluPHENAZine decanoate (PROLIXIN) 25 MG/ML injection Inject 1.5 mLs (37.5 mg total) into the muscle every 14 (fourteen) days. 5 mL 2  . fluPHENAZine decanoate (PROLIXIN) 25 MG/ML injection INJECT 1.5 MLS (37.5 MG TOTAL) INTO THE MUSCLE EVERY 14 (FOURTEEN) DAYS. 5 mL 2  . hydroxypropyl methylcellulose / hypromellose (ISOPTO TEARS / GONIOVISC) 2.5 % ophthalmic solution Place 2 drops into  both eyes daily as needed for dry eyes.    . hydrOXYzine (ATARAX/VISTARIL) 10 MG tablet Take 1 tablet (10 mg total) by mouth at bedtime. 90 tablet 0  . losartan (COZAAR) 100 MG tablet TAKE 1 TABLET BY MOUTH EVERY DAY 90 tablet 1  . pravastatin (PRAVACHOL) 40 MG tablet Take 1 tablet (40 mg total) by mouth daily. 90 tablet 3  . tamsulosin (FLOMAX) 0.4 MG CAPS capsule Take 0.4 mg by mouth daily.     Current Facility-Administered Medications  Medication Dose Route Frequency Provider Last Rate Last Dose  . fluPHENAZine decanoate (PROLIXIN) injection 37.5 mg  37.5 mg Intramuscular Q14 Days Veleka Djordjevic, Arlyce Harman, MD   37.5 mg at 01/25/17 1006    Neurologic: Headache: No Seizure: No Paresthesias: No  Musculoskeletal: Strength & Muscle Tone: within normal limits Gait & Station: normal Patient leans: N/A  Psychiatric Specialty Exam: ROS  Blood pressure (!) 152/86, pulse 96, height 6' 0.5" (1.842 m), weight 237 lb 9.6 oz (107.8 kg).Body mass index is 31.78 kg/m.  General Appearance: Casual  Eye Contact:  Fair  Speech:  Slow  Volume:  Decreased  Mood:  Euthymic  Affect:  Restricted  Thought Process:  Goal Directed  Orientation:  Full (Time, Place, and Person)  Thought Content: Rumination   Suicidal Thoughts:  No  Homicidal Thoughts:  No  Memory:  Immediate;   Fair Recent;   Fair Remote;   Fair  Judgement:  Good  Insight:  Good  Psychomotor Activity:  Tremor  Concentration:  Concentration: Fair and Attention Span: Fair  Recall:  Good  Fund of Knowledge: Good  Language: Good  Akathisia:  No  Handed:  Right  AIMS (if indicated):  0  Assets:  Communication Skills Desire for Improvement Housing Physical Health Resilience  ADL's:  Intact  Cognition: WNL  Sleep:  good    Assessment: Schizophrenia chronic paranoid type   Plan: Patient is a stable on his current Prolixin Dec 37.5 mg intramuscular every 2 weeks.  He does not need a new prescription for Atarax 10 mg because he is not  taking every night.  He will call to get refills if needed in the future.  Discussed medication side effects and benefits.  Recommended to call us back if his any question, concern or if he feel worsening of the symptom.  Follow-up in 3 months.    Leshay Desaulniers T., MD 01/31/2017, 10:49 AM

## 2017-02-08 ENCOUNTER — Ambulatory Visit (INDEPENDENT_AMBULATORY_CARE_PROVIDER_SITE_OTHER): Payer: Medicare Other

## 2017-02-08 VITALS — BP 152/108 | Temp 89.0°F | Resp 18 | Wt 238.4 lb

## 2017-02-08 DIAGNOSIS — F200 Paranoid schizophrenia: Secondary | ICD-10-CM

## 2017-02-08 MED ORDER — FLUPHENAZINE DECANOATE 25 MG/ML IJ SOLN
37.5000 mg | Freq: Once | INTRAMUSCULAR | Status: AC
  Administered 2017-02-08: 37.5 mg via INTRAMUSCULAR

## 2017-02-08 MED ORDER — FLUPHENAZINE DECANOATE 25 MG/ML IJ SOLN
25.0000 mg | INTRAMUSCULAR | Status: DC
  Administered 2017-02-08: 25 mg via INTRAMUSCULAR

## 2017-02-08 NOTE — Progress Notes (Signed)
Patient presented with flat affect, level mood and denied any current auditory or visual hallucinations, no suicidal or homicidal ideations and denial of any problems or symptoms at this time. Patient reported no problems with current medications and due Prolixin Decanoate 37.5 mg/1.5 ml injection prepared as ordered and given to patient in his rightdeltoid area.Patient tolerated injection without complaint of any pain or discomfort and agreed to return in 2 weeks for next due injection. Patient to call as needed if any problems with symptoms worsen prior to 2 week appointment.

## 2017-02-22 ENCOUNTER — Ambulatory Visit (INDEPENDENT_AMBULATORY_CARE_PROVIDER_SITE_OTHER): Payer: Medicare Other

## 2017-02-22 DIAGNOSIS — F200 Paranoid schizophrenia: Secondary | ICD-10-CM | POA: Diagnosis not present

## 2017-02-22 MED ORDER — FLUPHENAZINE DECANOATE 25 MG/ML IJ SOLN
25.0000 mg | INTRAMUSCULAR | Status: DC
  Administered 2017-02-22 – 2017-05-17 (×7): 25 mg via INTRAMUSCULAR

## 2017-02-22 NOTE — Progress Notes (Signed)
Patient presented with flat affect, level mood and denied any current auditory or visual hallucinations, no suicidal or homicidal ideations and denial of any symptoms at this time. Patient told me that his daughter has passed away at 62 years old, they do not know why. Patient was tearful, but did not cry. His TD is getting a little worse and I spoke to him about Ingreza, I told him to tald to Dr. Adele Schilder about it. Patient reported no problems with current medications and due Prolixin Decanoate 37.5 mg/1.5 ml injection prepared as ordered and given to patient in his rightdeltoid area.Patient tolerated injection without complaint of any pain or discomfort and agreed to return in 2 weeks for next due injection. Patient to call as needed if any problems with symptoms worsen prior to 2 week appointment.

## 2017-03-02 DIAGNOSIS — H52203 Unspecified astigmatism, bilateral: Secondary | ICD-10-CM | POA: Diagnosis not present

## 2017-03-02 DIAGNOSIS — E119 Type 2 diabetes mellitus without complications: Secondary | ICD-10-CM | POA: Diagnosis not present

## 2017-03-02 DIAGNOSIS — H2513 Age-related nuclear cataract, bilateral: Secondary | ICD-10-CM | POA: Diagnosis not present

## 2017-03-02 LAB — HM DIABETES EYE EXAM

## 2017-03-08 ENCOUNTER — Ambulatory Visit (INDEPENDENT_AMBULATORY_CARE_PROVIDER_SITE_OTHER): Payer: Medicare Other

## 2017-03-08 ENCOUNTER — Other Ambulatory Visit (HOSPITAL_COMMUNITY): Payer: Self-pay

## 2017-03-08 ENCOUNTER — Encounter (HOSPITAL_COMMUNITY): Payer: Self-pay

## 2017-03-08 ENCOUNTER — Telehealth (HOSPITAL_COMMUNITY): Payer: Self-pay

## 2017-03-08 DIAGNOSIS — F200 Paranoid schizophrenia: Secondary | ICD-10-CM

## 2017-03-08 MED ORDER — HYDROXYZINE HCL 10 MG PO TABS: 10.0000 mg | ORAL_TABLET | Freq: Every day | ORAL | 0 refills | Status: DC

## 2017-03-08 NOTE — Telephone Encounter (Signed)
I will discuss with him on his next appointment

## 2017-03-08 NOTE — Progress Notes (Signed)
Patient presented with flat affect, level mood and denied any current auditory or visual hallucinations, no suicidal or homicidal ideations and denial of any symptoms at this time. Patient reported no problems with current medications and due Prolixin Decanoate 37.5 mg/1.5 ml injection prepared as ordered and given to patient in his leftdeltoid area.Patient tolerated injection without complaint of any pain or discomfort and agreed to return in 2 weeks for next due injection. Patient to call as needed if any problems with symptoms worsen prior to 2 week appointment.

## 2017-03-08 NOTE — Telephone Encounter (Signed)
Patient was here for injection today, he is asking about Wayne Schmidt and would like to know if he can try it. Shawn and I had talked to him in the past about it and he wants to see if it will work. He does not follow up with you until November. Please review and advise, thank you

## 2017-03-13 ENCOUNTER — Encounter: Payer: Self-pay | Admitting: *Deleted

## 2017-03-20 ENCOUNTER — Ambulatory Visit (INDEPENDENT_AMBULATORY_CARE_PROVIDER_SITE_OTHER): Payer: Medicare Other | Admitting: Internal Medicine

## 2017-03-20 ENCOUNTER — Encounter: Payer: Self-pay | Admitting: Internal Medicine

## 2017-03-20 VITALS — BP 153/89 | HR 88 | Temp 98.2°F | Ht 72.0 in | Wt 238.9 lb

## 2017-03-20 DIAGNOSIS — Z23 Encounter for immunization: Secondary | ICD-10-CM | POA: Diagnosis not present

## 2017-03-20 DIAGNOSIS — R351 Nocturia: Secondary | ICD-10-CM | POA: Diagnosis not present

## 2017-03-20 DIAGNOSIS — N401 Enlarged prostate with lower urinary tract symptoms: Secondary | ICD-10-CM

## 2017-03-20 DIAGNOSIS — E119 Type 2 diabetes mellitus without complications: Secondary | ICD-10-CM

## 2017-03-20 DIAGNOSIS — I10 Essential (primary) hypertension: Secondary | ICD-10-CM | POA: Diagnosis not present

## 2017-03-20 DIAGNOSIS — Z0000 Encounter for general adult medical examination without abnormal findings: Secondary | ICD-10-CM

## 2017-03-20 LAB — POCT GLYCOSYLATED HEMOGLOBIN (HGB A1C): Hemoglobin A1C: 7.5

## 2017-03-20 LAB — GLUCOSE, CAPILLARY: Glucose-Capillary: 183 mg/dL — ABNORMAL HIGH (ref 65–99)

## 2017-03-20 MED ORDER — EMPAGLIFLOZIN 10 MG PO TABS: 10.0000 mg | ORAL_TABLET | Freq: Every day | ORAL | 3 refills | Status: DC

## 2017-03-20 MED ORDER — CANAGLIFLOZIN 100 MG PO TABS: 100.0000 mg | ORAL_TABLET | Freq: Every day | ORAL | 0 refills | Status: DC

## 2017-03-20 NOTE — Assessment & Plan Note (Signed)
-   patient's A1C has worsened to 7.5 from 7.2 - He states that he was unable to tolerate metformin in the past - He does have some increased frequency of urination today - Will start him on emapgliflozin 10 mg today and have him follow up in 3 months - Patient given information about empagliflozin to read - I asked him to call me if he has any issues with this medication

## 2017-03-20 NOTE — Assessment & Plan Note (Signed)
-   Patient was given a flu shot today

## 2017-03-20 NOTE — Patient Instructions (Addendum)
- It was a pleasure seeing you today - I have prescribed empagliflozin for your diabetes. Please let me know if you have any issues with this medication - Please continue with your diet and exercise for your diabetes - Please call if you need any refills  Empagliflozin oral tablets What is this medicine? EMPAGLIFLOZIN (EM pa gli FLOE zin) helps to treat type 2 diabetes. It helps to control blood sugar. This drug may also reduce the risk of heart attack or stroke if you have type 2 diabetes and risk factors for heart disease. Treatment is combined with diet and exercise. This medicine may be used for other purposes; ask your health care provider or pharmacist if you have questions. COMMON BRAND NAME(S): JARDIANCE What should I tell my health care provider before I take this medicine? They need to know if you have any of these conditions: -dehydration -diabetic ketoacidosis -diet low in salt -eating less due to illness, surgery, dieting, or any other reason -having surgery -high cholesterol -high levels of potassium in the blood -history of pancreatitis or pancreas problems -history of yeast infection of the penis or vagina -if you often drink alcohol -infections in the bladder, kidneys, or urinary tract -kidney disease -liver disease -low blood pressure -on hemodialysis -problems urinating -type 1 diabetes -uncircumcised male -an unusual or allergic reaction to empagliflozin, other medicines, foods, dyes, or preservatives -pregnant or trying to get pregnant -breast-feeding How should I use this medicine? Take this medicine by mouth with a glass of water. Follow the directions on the prescription label. Take it in the morning, with or without food. Take your dose at the same time each day. Do not take more often than directed. Do not stop taking except on your doctor's advice. Talk to your pediatrician regarding the use of this medicine in children. Special care may be  needed. Overdosage: If you think you have taken too much of this medicine contact a poison control center or emergency room at once. NOTE: This medicine is only for you. Do not share this medicine with others. What if I miss a dose? If you miss a dose, take it as soon as you can. If it is almost time for your next dose, take only that dose. Do not take double or extra doses. What may interact with this medicine? Do not take this medicine with any of the following medications: -gatifloxacin This medicine may also interact with the following medications: -alcohol -certain medicines for blood pressure, heart disease -diuretics This list may not describe all possible interactions. Give your health care provider a list of all the medicines, herbs, non-prescription drugs, or dietary supplements you use. Also tell them if you smoke, drink alcohol, or use illegal drugs. Some items may interact with your medicine. What should I watch for while using this medicine? Visit your doctor or health care professional for regular checks on your progress. This medicine can cause a serious condition in which there is too much acid in the blood. If you develop nausea, vomiting, stomach pain, unusual tiredness, or breathing problems, stop taking this medicine and call your doctor right away. If possible, use a ketone dipstick to check for ketones in your urine. A test called the HbA1C (A1C) will be monitored. This is a simple blood test. It measures your blood sugar control over the last 2 to 3 months. You will receive this test every 3 to 6 months. Learn how to check your blood sugar. Learn the symptoms of low and  high blood sugar and how to manage them. Always carry a quick-source of sugar with you in case you have symptoms of low blood sugar. Examples include hard sugar candy or glucose tablets. Make sure others know that you can choke if you eat or drink when you develop serious symptoms of low blood sugar, such as  seizures or unconsciousness. They must get medical help at once. Tell your doctor or health care professional if you have high blood sugar. You might need to change the dose of your medicine. If you are sick or exercising more than usual, you might need to change the dose of your medicine. Do not skip meals. Ask your doctor or health care professional if you should avoid alcohol. Many nonprescription cough and cold products contain sugar or alcohol. These can affect blood sugar. Wear a medical ID bracelet or chain, and carry a card that describes your disease and details of your medicine and dosage times. What side effects may I notice from receiving this medicine? Side effects that you should report to your doctor or health care professional as soon as possible: -allergic reactions like skin rash, itching or hives, swelling of the face, lips, or tongue -breathing problems -dizziness -fast or irregular heartbeat -feeling faint or lightheaded, falls -muscle weakness -nausea, vomiting, unusual stomach upset or pain -signs and symptoms of low blood sugar such as feeling anxious, confusion, dizziness, increased hunger, unusually weak or tired, sweating, shakiness, cold, irritable, headache, blurred vision, fast heartbeat, loss of consciousness -signs and symptoms of a urinary tract infection, such as fever, chills, a burning feeling when urinating, blood in the urine, back pain -trouble passing urine or change in the amount of urine, including an urgent need to urinate more often, in larger amounts, or at night -penile discharge, itching, or pain in men -unusual tiredness -vaginal discharge, itching, or odor in women Side effects that usually do not require medical attention (report to your doctor or health care professional if they continue or are bothersome): -joint pain -mild increase in urination -thirsty This list may not describe all possible side effects. Call your doctor for medical advice  about side effects. You may report side effects to FDA at 1-800-FDA-1088. Where should I keep my medicine? Keep out of the reach of children. Store at room temperature between 20 and 25 degrees C (68 and 77 degrees F). Throw away any unused medicine after the expiration date. NOTE: This sheet is a summary. It may not cover all possible information. If you have questions about this medicine, talk to your doctor, pharmacist, or health care provider.  2018 Elsevier/Gold Standard (2015-07-22 11:46:10)

## 2017-03-20 NOTE — Progress Notes (Signed)
   Subjective:    Patient ID: Wayne Schmidt, male    DOB: 06-03-1956, 61 y.o.   MRN: [id number]  HPI I have seen and examined this patient. He is here for routine follow up of his DM and HTN.  Patient denies any new complaints today but states that he has been feeling less energetic since his psychiatrist changed the dose of his fluphenazine months ago.    Review of Systems  Constitutional: Positive for fatigue. Negative for activity change, appetite change and fever.  HENT: Negative.   Respiratory: Negative.   Cardiovascular: Negative.   Gastrointestinal: Negative.   Musculoskeletal: Negative.   Skin: Negative.   Neurological: Negative.   Psychiatric/Behavioral: Negative.        Objective:   Physical Exam  Constitutional: He is oriented to person, place, and time. He appears well-developed and well-nourished.  HENT:  Head: Normocephalic and atraumatic.  Mouth/Throat: No oropharyngeal exudate.  Neck: Neck supple.  Cardiovascular: Normal rate, regular rhythm and normal heart sounds.   Pulmonary/Chest: Effort normal and breath sounds normal. No respiratory distress. He has no wheezes.  Abdominal: Soft. Bowel sounds are normal. He exhibits no distension. There is no tenderness.  Musculoskeletal: Normal range of motion. He exhibits no edema.  Lymphadenopathy:    He has no cervical adenopathy.  Neurological: He is alert and oriented to person, place, and time.  Skin: Skin is warm. No erythema.  Psychiatric: He has a normal mood and affect. His behavior is normal.          Assessment & Plan:  Please see problem based charting for assessment and plan:

## 2017-03-20 NOTE — Assessment & Plan Note (Signed)
BP Readings from Last 3 Encounters:  03/20/17 (!) 153/89  03/08/17 (!) 165/94  02/08/17 (!) 152/108    Lab Results  Component Value Date   NA 141 12/12/2016   K 4.5 12/12/2016   CREATININE 1.25 12/12/2016    Assessment: Blood pressure control:  fair Progress toward BP goal:   improved Comments: patient is complaint with his losartan 100 mg and cardura 4 mg but did not take his medications this morning.  Plan: Medications:  continue current medications Educational resources provided: brochure (denies need ) Self management tools provided:   Other plans:

## 2017-03-20 NOTE — Assessment & Plan Note (Signed)
-   Patient states he followed up with his urologist for this and was advised to continue his tamsulosin for now - He does complain of increased frequency but this is much improved with the medication - Will continue with tamsulosin for now. Patient will follow up with his urologist in 3 months

## 2017-03-22 ENCOUNTER — Ambulatory Visit (INDEPENDENT_AMBULATORY_CARE_PROVIDER_SITE_OTHER): Payer: Medicare Other

## 2017-03-22 ENCOUNTER — Encounter (HOSPITAL_COMMUNITY): Payer: Self-pay

## 2017-03-22 DIAGNOSIS — F200 Paranoid schizophrenia: Secondary | ICD-10-CM

## 2017-03-22 NOTE — Progress Notes (Signed)
Patient presented with flat affect, level mood and denied any suicidal or homicidal ideations and no auditory or visual hallucinations.  Patient reported no problems with current medication regimen or other symptoms or side effects to medication today.  Patient's due Prolixin Decanoate IM injection of 37.5mg /1.5 ml prepared as ordered and given to patient in his right deltoid area.  Patient tolerated injection without complaint of pain or discomfort and will return for next due injection in 2 weeks.  Patient to call if any problem prior to next appointment.

## 2017-04-05 ENCOUNTER — Ambulatory Visit (INDEPENDENT_AMBULATORY_CARE_PROVIDER_SITE_OTHER): Payer: Medicare Other

## 2017-04-05 DIAGNOSIS — F200 Paranoid schizophrenia: Secondary | ICD-10-CM | POA: Diagnosis not present

## 2017-04-05 NOTE — Progress Notes (Signed)
Patient presented with flat affect, level mood and denied any suicidal or homicidal ideations and no auditory or visual hallucinations.  Patient reported no problems with current medication regimen or other symptoms or side effects to medication today.  Patient's due Prolixin Decanoate IM injection of 37.5mg /1.5 ml prepared as ordered and given to patient in his left deltoid area.  Patient tolerated injection without complaint of pain or discomfort and will return for next due injection in 2 weeks.  Patient to call if any problem prior to next appointment.

## 2017-04-19 ENCOUNTER — Ambulatory Visit (INDEPENDENT_AMBULATORY_CARE_PROVIDER_SITE_OTHER): Payer: Medicare Other

## 2017-04-19 DIAGNOSIS — F200 Paranoid schizophrenia: Secondary | ICD-10-CM

## 2017-04-19 NOTE — Progress Notes (Signed)
Patient presented with flat affect, level mood and denied any suicidal or homicidal ideations and no auditory or visual hallucinations. Patient reported no problems with current medication regimen or other symptoms or side effects to medication today. Patient's due Prolixin Decanoate IM injection of 37.5mg /1.5 ml prepared as ordered and given to patient in his right deltoid area. Patient tolerated injection without complaint of pain or discomfort and will return for next due injection in 2 weeks. Patient to call if any problem prior to next appointment.

## 2017-05-03 ENCOUNTER — Ambulatory Visit (INDEPENDENT_AMBULATORY_CARE_PROVIDER_SITE_OTHER): Payer: Medicare Other

## 2017-05-03 DIAGNOSIS — F200 Paranoid schizophrenia: Secondary | ICD-10-CM | POA: Diagnosis not present

## 2017-05-03 NOTE — Progress Notes (Signed)
Patient presented with flat affect, level mood and denied any suicidal or homicidal ideations and no auditory or visual hallucinations. Patient reported no problems with current medication regimen or other symptoms or side effects to medication today. Patient's due Prolixin Decanoate IM injection of 37.5mg /1.5 ml prepared as ordered and given to patient in his leftdeltoid area. Patient tolerated injection without complaint of pain or discomfort and will return for next due injection in 2 weeks. Patient to call if any problem prior to next appointment.

## 2017-05-07 ENCOUNTER — Ambulatory Visit (INDEPENDENT_AMBULATORY_CARE_PROVIDER_SITE_OTHER): Payer: Medicare Other | Admitting: Psychiatry

## 2017-05-07 ENCOUNTER — Encounter (HOSPITAL_COMMUNITY): Payer: Self-pay | Admitting: Psychiatry

## 2017-05-07 DIAGNOSIS — F200 Paranoid schizophrenia: Secondary | ICD-10-CM

## 2017-05-07 NOTE — Progress Notes (Signed)
BH MD/PA/NP OP Progress Note  05/07/2017 10:29 AM Wayne Schmidt  MRN:  [id number]  Chief Complaint: I am doing good.  I do not want to change my medication.  HPI: Patient came for his follow-up appointment.  He is doing better on his medication.  Denies any paranoia, hallucination and is sleeping good.  We talked about Cherylann Banas but he does not want to switch his medication.  His energy level is good.  He denies any irritability or any mania.  Some time he has mild tremors in his hand but it does not affect his daily living.  He endorsed Prolixin helping his mood and sleep.  He is getting injection every 2 weeks.  Recently he is seen his primary care physician and his hemoglobin A1c jumped from 7.2-7.5.  He is not taking Jardiance to get better control of blood sugar.  He has not taken Vistaril in the past 6 months.  Patient denies drinking alcohol or using any illegal substances.  His energy level is good.  His vital signs are stable.  Visit Diagnosis:    ICD-10-CM   1. 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 Patient has a long history of psychiatric illness with multiple hospitalization. He recall at least 16-20 times hospitalized. He was admitted at [facility] for psychosis hallucination and paranoia. In the past he had tried Haldol and Thorazine but endorse that he was allergic to these medication. He is taking Prolixin injection for past 30 years. He has at least 2 psychiatric admission because of suicidal attempt when he took overdose on his medication.  Past Medical History:  Past Medical History:  Diagnosis Date  . Diabetes mellitus without complication ([place])   . High risk sexual behavior 10/2007  . Hypertension   . Microcytosis   . Schizophrenia ([place])   . Substance abuse    tobacco use    Past Surgical History:  Procedure Laterality Date  . LAMINECTOMY     by Dr Rolin Barry    Family Psychiatric History:  Reviewed.  Family History:  Family History  Problem Relation Age of Onset  . Diabetes Mother   . Hypertension Mother   . Lupus Sister   . Sickle cell anemia Brother   . Drug abuse Brother     Social History:  Social History   Socioeconomic History  . Marital status: Legally Separated    Spouse name: Not on file  . Number of children: Not on file  . Years of education: Not on file  . Highest education level: Not on file  Social Needs  . Financial resource strain: Not on file  . Food insecurity - worry: Not on file  . Food insecurity - inability: Not on file  . Transportation needs - medical: Not on file  . Transportation needs - non-medical: Not on file  Occupational History  . Not on file  Tobacco Use  . Smoking status: Current Every Day Smoker    Packs/day: 1.50    Years: 38.00    Pack years: 57.00    Types: Cigarettes  . Smokeless tobacco: Never Used  . Tobacco comment:   Has tried to keep to a pack  Substance and Sexual Activity  . Alcohol use: No    Alcohol/week: 0.0 oz    Comment: Occasional use   . Drug use: No    Comment: h/o remote cocaine use-denies present use  . Sexual activity: Not Currently  Other Topics Concern  .  Not on file  Social History Narrative   Patient given diabetes card 07/07/2010    Allergies:  Allergies  Allergen Reactions  . Ace Inhibitors Cough  . Atorvastatin Other (See Comments)    Dizzy / Faint  . Chlorpromazine Hcl Other (See Comments)    Passed out   . Simvastatin Other (See Comments)    Dizzy / Faint    Metabolic Disorder Labs: Lab Results  Component Value Date   HGBA1C 7.5 03/20/2017   MPG 163 08/24/2016   No results found for: PROLACTIN Lab Results  Component Value Date   CHOL 159 12/12/2016   TRIG 61 12/12/2016   HDL 62 12/12/2016   CHOLHDL 2.6 12/12/2016   VLDL 11 04/01/2014   LDLCALC 85 12/12/2016   LDLCALC 87 01/11/2016   Lab Results  Component Value Date   TSH 1.470 12/12/2016   TSH 1.647  10/29/2008    Therapeutic Level Labs: No results found for: LITHIUM No results found for: VALPROATE No components found for:  CBMZ  Current Medications: Current Outpatient Medications  Medication Sig Dispense Refill  . doxazosin (CARDURA) 4 MG tablet TAKE 1 TABLET BY MOUTH EVERY DAY 90 tablet 1  . empagliflozin (JARDIANCE) 10 MG TABS tablet Take 10 mg by mouth daily. 30 tablet 3  . fluPHENAZine decanoate (PROLIXIN) 25 MG/ML injection INJECT 1.5 MLS (37.5 MG TOTAL) INTO THE MUSCLE EVERY 14 (FOURTEEN) DAYS. 5 mL 2  . hydroxypropyl methylcellulose / hypromellose (ISOPTO TEARS / GONIOVISC) 2.5 % ophthalmic solution Place 2 drops into both eyes daily as needed for dry eyes.    . hydrOXYzine (ATARAX/VISTARIL) 10 MG tablet Take 1 tablet (10 mg total) by mouth at bedtime. 90 tablet 0  . losartan (COZAAR) 100 MG tablet TAKE 1 TABLET BY MOUTH EVERY DAY 90 tablet 1  . pravastatin (PRAVACHOL) 40 MG tablet Take 1 tablet (40 mg total) by mouth daily. 90 tablet 3  . tamsulosin (FLOMAX) 0.4 MG CAPS capsule Take 0.4 mg by mouth daily.     Current Facility-Administered Medications  Medication Dose Route Frequency Provider Last Rate Last Dose  . fluPHENAZine decanoate (PROLIXIN) injection 25 mg  25 mg Intramuscular Q14 Days Zaydenn Balaguer, Arlyce Harman, MD   25 mg at 05/03/17 1003     Musculoskeletal: Strength & Muscle Tone: within normal limits Gait & Station: normal Patient leans: N/A  Psychiatric Specialty Exam: ROS  There were no vitals taken for this visit.There is no height or weight on file to calculate BMI.  General Appearance: Casual  Eye Contact:  Fair  Speech:  Clear and Coherent  Volume:  Normal  Mood:  Euthymic  Affect:  Appropriate  Thought Process:  Goal Directed  Orientation:  Full (Time, Place, and Person)  Thought Content: Rumination   Suicidal Thoughts:  No  Homicidal Thoughts:  No  Memory:  Immediate;   Good Recent;   Fair Remote;   Fair  Judgement:  Good  Insight:  Good   Psychomotor Activity:  Tremor and Mild tremors in his both hand  Concentration:  Concentration: Fair and Attention Span: Fair  Recall:  Good  Fund of Knowledge: Good  Language: Good  Akathisia:  No  Handed:  Right  AIMS (if indicated): not done  Assets:  Communication Skills Desire for Improvement Housing  ADL's:  Intact  Cognition: WNL  Sleep:  Good   Screenings: PHQ2-9     Office Visit from 12/12/2016 in [place] Office Visit from 09/05/2016 in [facility] Office Visit from 08/29/2016 in [place] Office Visit from 04/11/2016 in [place] Office Visit from 10/13/2015 in [place]  PHQ-2 Total Score  0  0  0  0  0       Assessment and Plan: Schizophrenia chronic paranoid type.  Patient is a stable on his current psychiatric medication.  I will continue Prolixin deck 37.5 mg IM every 2 weeks.  He does not need a new prescription for Atarax because he is not taking it.  Discussed medication side effects and benefits.  Recommended to call us back if he has any question or any concern.  Follow-up in 3 months.  He is not interested in ingreza this time.   Raji Glinski T., MD 05/07/2017, 10:29 AM

## 2017-05-17 ENCOUNTER — Ambulatory Visit (INDEPENDENT_AMBULATORY_CARE_PROVIDER_SITE_OTHER): Payer: Medicare Other

## 2017-05-17 DIAGNOSIS — F200 Paranoid schizophrenia: Secondary | ICD-10-CM

## 2017-05-17 NOTE — Progress Notes (Signed)
Patient presented with flat affect, level mood and denied any suicidal or homicidal ideations and no auditory or visual hallucinations. Patient reported no problems with current medication regimen or other symptoms or side effects to medication today. Patient's due Prolixin Decanoate IM injection of 37.5mg /1.5 ml prepared as ordered and given to patient in his rightdeltoid area. Patient tolerated injection without complaint of pain or discomfort and will return for next due injection in 2 weeks. Patient to call if any problem prior to next appointment.

## 2017-05-27 ENCOUNTER — Other Ambulatory Visit (HOSPITAL_COMMUNITY): Payer: Self-pay | Admitting: Psychiatry

## 2017-05-27 DIAGNOSIS — F200 Paranoid schizophrenia: Secondary | ICD-10-CM

## 2017-05-31 ENCOUNTER — Encounter (HOSPITAL_COMMUNITY): Payer: Self-pay

## 2017-05-31 ENCOUNTER — Ambulatory Visit (INDEPENDENT_AMBULATORY_CARE_PROVIDER_SITE_OTHER): Payer: Medicare Other

## 2017-05-31 ENCOUNTER — Other Ambulatory Visit (HOSPITAL_COMMUNITY): Payer: Self-pay

## 2017-05-31 VITALS — BP 132/78 | HR 87 | Ht 72.5 in | Wt 231.6 lb

## 2017-05-31 DIAGNOSIS — F200 Paranoid schizophrenia: Secondary | ICD-10-CM | POA: Diagnosis not present

## 2017-05-31 MED ORDER — FLUPHENAZINE DECANOATE 25 MG/ML IJ SOLN
37.5000 mg | INTRAMUSCULAR | Status: DC
  Administered 2017-05-31 – 2017-07-26 (×5): 37.5 mg via INTRAMUSCULAR

## 2017-05-31 MED ORDER — FLUPHENAZINE DECANOATE 25 MG/ML IJ SOLN: 37.5000 mg | INTRAMUSCULAR | 2 refills | Status: DC

## 2017-05-31 NOTE — Progress Notes (Signed)
Patient presented with flat affect, level mood and denied any current problems with medications or symptoms.  No auditory or visual hallucinations, no suicidal or homicidal ideations and reported having a good Thanksgiving.  Patient's due Prolixin Decanoate 37.5mg /1.37ml IM injection prepared as ordered and given to patient in his left deltoid area.  Patient tolerated due injection without complaint of pain or discomfort and agreed to return in 2 weeks for next due injection.  Patient agreed to call if any problems prior to next appointment.

## 2017-06-14 ENCOUNTER — Encounter (HOSPITAL_COMMUNITY): Payer: Self-pay

## 2017-06-14 ENCOUNTER — Ambulatory Visit (INDEPENDENT_AMBULATORY_CARE_PROVIDER_SITE_OTHER): Payer: Medicare Other

## 2017-06-14 VITALS — BP 128/78 | HR 85 | Ht 72.5 in | Wt 232.0 lb

## 2017-06-14 DIAGNOSIS — F200 Paranoid schizophrenia: Secondary | ICD-10-CM

## 2017-06-14 NOTE — Progress Notes (Signed)
Patient presented with flat affect, level mood and denied and auditory or visual hallucinations, no suicidal or homicidal ideations and reported no current problems with medication or symptoms.  Patient's due Prolixin Decanoate 37.5 mg/1.5 ml IM injection every 2 weeks prepared as ordered and administered to patient in his right deltoid area.  Patient tolerated due injection without complaint of pain or discomfort and agreed to return in 2 weeks for next due injection.  Patient to call if any problems prior to next appointment.

## 2017-06-18 ENCOUNTER — Other Ambulatory Visit: Payer: Self-pay | Admitting: Internal Medicine

## 2017-06-18 DIAGNOSIS — I10 Essential (primary) hypertension: Secondary | ICD-10-CM

## 2017-06-19 NOTE — Telephone Encounter (Signed)
Next appt scheduled  08/07/17 with PCP.

## 2017-06-28 ENCOUNTER — Ambulatory Visit (INDEPENDENT_AMBULATORY_CARE_PROVIDER_SITE_OTHER): Payer: Medicare Other

## 2017-06-28 DIAGNOSIS — F200 Paranoid schizophrenia: Secondary | ICD-10-CM

## 2017-06-28 NOTE — Progress Notes (Signed)
Patient presented with flat affect, level mood and denied and auditory or visual hallucinations, no suicidal or homicidal ideations and reported he is having a lot of muscle aches from the medication. Patient's due Prolixin Decanoate 37.5 mg/1.5 ml IM injection every 2 weeks prepared as ordered and administered to patient in his left deltoid area. Patient tolerated due injection without complaint of pain or discomfort and agreed to return in 2 weeks for next due injection.  Patient to call if any problems prior to next appointment.

## 2017-07-12 ENCOUNTER — Ambulatory Visit (INDEPENDENT_AMBULATORY_CARE_PROVIDER_SITE_OTHER): Payer: Medicare Other

## 2017-07-12 DIAGNOSIS — F200 Paranoid schizophrenia: Secondary | ICD-10-CM

## 2017-07-12 NOTE — Progress Notes (Signed)
Patient presented with flat affect, level mood and denied and auditory or visual hallucinations, no suicidal or homicidal ideations and reported he is having a lot of muscle aches from the medication. Patient's due Prolixin Decanoate 37.5 mg/1.5 ml IM injection every 2 weeks prepared as ordered and administered to patient in his right deltoid area. Patient tolerated due injection without complaint of pain or discomfort and agreed to return in 2 weeks for next due injection. Patient to call if any problems prior to next appointment.

## 2017-07-23 ENCOUNTER — Other Ambulatory Visit: Payer: Self-pay | Admitting: Internal Medicine

## 2017-07-23 DIAGNOSIS — E7849 Other hyperlipidemia: Secondary | ICD-10-CM

## 2017-07-26 ENCOUNTER — Ambulatory Visit (INDEPENDENT_AMBULATORY_CARE_PROVIDER_SITE_OTHER): Payer: Medicare Other

## 2017-07-26 DIAGNOSIS — F200 Paranoid schizophrenia: Secondary | ICD-10-CM

## 2017-07-26 NOTE — Progress Notes (Signed)
Patient presented with flat affect, level mood and denied and auditory or visual hallucinations, no suicidal or homicidal ideations and reportedhe is having a lot of muscle aches from the medication.Patient's due Prolixin Decanoate 37.5 mg/1.5 ml IM injection every 2 weeks prepared as ordered and administered to patient in hisleft deltoid area. Patient tolerated due injection without complaint of pain or discomfort and agreed to return in 2 weeks for next due injection. Patient to call if any problems prior to next appointment.

## 2017-07-28 ENCOUNTER — Other Ambulatory Visit: Payer: Self-pay | Admitting: Internal Medicine

## 2017-07-28 DIAGNOSIS — E119 Type 2 diabetes mellitus without complications: Secondary | ICD-10-CM

## 2017-08-07 ENCOUNTER — Other Ambulatory Visit: Payer: Self-pay

## 2017-08-07 ENCOUNTER — Ambulatory Visit (INDEPENDENT_AMBULATORY_CARE_PROVIDER_SITE_OTHER): Payer: Medicare Other | Admitting: Internal Medicine

## 2017-08-07 ENCOUNTER — Encounter: Payer: Self-pay | Admitting: Internal Medicine

## 2017-08-07 VITALS — BP 144/76 | HR 92 | Temp 97.7°F | Ht 72.0 in | Wt 232.5 lb

## 2017-08-07 DIAGNOSIS — E785 Hyperlipidemia, unspecified: Secondary | ICD-10-CM | POA: Diagnosis not present

## 2017-08-07 DIAGNOSIS — Z23 Encounter for immunization: Secondary | ICD-10-CM | POA: Diagnosis not present

## 2017-08-07 DIAGNOSIS — Z79899 Other long term (current) drug therapy: Secondary | ICD-10-CM | POA: Diagnosis not present

## 2017-08-07 DIAGNOSIS — Z0000 Encounter for general adult medical examination without abnormal findings: Secondary | ICD-10-CM

## 2017-08-07 DIAGNOSIS — F200 Paranoid schizophrenia: Secondary | ICD-10-CM | POA: Diagnosis not present

## 2017-08-07 DIAGNOSIS — E119 Type 2 diabetes mellitus without complications: Secondary | ICD-10-CM

## 2017-08-07 DIAGNOSIS — Z7984 Long term (current) use of oral hypoglycemic drugs: Secondary | ICD-10-CM

## 2017-08-07 DIAGNOSIS — I10 Essential (primary) hypertension: Secondary | ICD-10-CM

## 2017-08-07 DIAGNOSIS — F17210 Nicotine dependence, cigarettes, uncomplicated: Secondary | ICD-10-CM

## 2017-08-07 LAB — GLUCOSE, CAPILLARY: GLUCOSE-CAPILLARY: 125 mg/dL — AB (ref 65–99)

## 2017-08-07 LAB — POCT GLYCOSYLATED HEMOGLOBIN (HGB A1C): HEMOGLOBIN A1C: 6.9

## 2017-08-07 NOTE — Assessment & Plan Note (Addendum)
Lab Results  Component Value Date   HGBA1C 6.9 08/07/2017   HGBA1C 7.5 03/20/2017   HGBA1C 7.2 12/12/2016     Assessment: Diabetes control:  Well-controlled Progress toward A1C goal:   At goal Comments: Patient is compliant with Jardiance 10 mg daily  Plan: Medications:  continue current medications Home glucose monitoring: Frequency:   Timing:   Instruction/counseling given: other instruction/counseling: Patient reminded to be compliant with his medication daily Educational resources provided: brochure(denies need ) Self management tools provided:   Other plans: Foot exam done today

## 2017-08-07 NOTE — Assessment & Plan Note (Signed)
-  This problem is chronic and stable -Patient is on Prolixin injections every 2 weeks -He will continue with this regimen for now -He will follow-up with psychiatry as an outpatient -He would like to inquire with them as to alternative regimens as he feels that this regimen has too many side effects (dizziness and mental slowing).  I encouraged him to do this.   -No further workup for now

## 2017-08-07 NOTE — Assessment & Plan Note (Signed)
BP Readings from Last 3 Encounters:  08/07/17 (!) 144/76  06/14/17 128/78  05/31/17 132/78    Lab Results  Component Value Date   NA 141 12/12/2016   K 4.5 12/12/2016   CREATININE 1.25 12/12/2016    Assessment: Blood pressure control:  Fair Progress toward BP goal:   Deteriorated Comments: Patient is compliant with losartan 100 mg daily and doxazosin 4 mg  Plan: Medications:  continue current medications Educational resources provided: brochure(denies need ) Self management tools provided:   Other plans: We will continue with current medications for now.  Will consider adding amlodipine if his blood pressure remains elevated

## 2017-08-07 NOTE — Addendum Note (Signed)
Addended by: Sander Nephew F on: 08/07/2017 11:55 AM   Modules accepted: Orders

## 2017-08-07 NOTE — Patient Instructions (Signed)
-  It was a pleasure seeing you again -I will fill the paperwork for you and send it in -Please continue taking your hypertension and diabetes medications.  You are doing a great job of the blood pressure and diabetes is much better controlled -Please continue to follow-up with your psychiatrist and ask about alternative medications with less side effects -Please follow-up with me in 3 months if you still have not found a new PCP

## 2017-08-07 NOTE — Assessment & Plan Note (Signed)
-  This problem is chronic and stable -Patient is compliant with pravastatin -We will consider checking lipid panel on next visit

## 2017-08-07 NOTE — Assessment & Plan Note (Signed)
-  Patient is due for pneumonia vaccine today.  He is agreeable for this.  Will ask RN to administer

## 2017-08-07 NOTE — Progress Notes (Signed)
   Subjective:    Patient ID: Wayne Schmidt, male    DOB: 1956-03-13, 62 y.o.   MRN: [id number]  HPI  I have seen and examined this patient. Patient is here for routine follow-up of his hypertension and diabetes.  Patient is compliant with his medications.  He does state that he occasionally gets dizzy after taking his Vania Rea but has no other complaints at this time.   Review of Systems  Constitutional: Negative.   HENT: Negative.   Respiratory: Negative.   Cardiovascular: Negative.   Gastrointestinal: Negative.   Musculoskeletal: Negative.   Skin: Negative.   Neurological: Negative.   Psychiatric/Behavioral: Negative.        Objective:   Physical Exam  Constitutional: He is oriented to person, place, and time. He appears well-developed and well-nourished.  HENT:  Head: Normocephalic and atraumatic.  Mouth/Throat: No oropharyngeal exudate.  Neck: Neck supple.  Cardiovascular: Normal rate, regular rhythm and normal heart sounds.  Pulmonary/Chest: Effort normal and breath sounds normal. No respiratory distress. He has no wheezes.  Abdominal: Soft. Bowel sounds are normal. He exhibits no distension. There is no tenderness.  Musculoskeletal: Normal range of motion. He exhibits no edema.  Lymphadenopathy:    He has no cervical adenopathy.  Neurological: He is alert and oriented to person, place, and time.  Skin: Skin is warm. No rash noted. No erythema.  Psychiatric: His behavior is normal.  Patient has a flat affect          Assessment & Plan:  Please see problem based charting for assessment and plan:

## 2017-08-08 ENCOUNTER — Encounter (HOSPITAL_COMMUNITY): Payer: Self-pay | Admitting: Psychiatry

## 2017-08-08 ENCOUNTER — Ambulatory Visit (INDEPENDENT_AMBULATORY_CARE_PROVIDER_SITE_OTHER): Payer: Medicare Other | Admitting: Psychiatry

## 2017-08-08 ENCOUNTER — Ambulatory Visit (INDEPENDENT_AMBULATORY_CARE_PROVIDER_SITE_OTHER): Payer: Medicare Other

## 2017-08-08 DIAGNOSIS — F200 Paranoid schizophrenia: Secondary | ICD-10-CM

## 2017-08-08 DIAGNOSIS — Z915 Personal history of self-harm: Secondary | ICD-10-CM

## 2017-08-08 DIAGNOSIS — R251 Tremor, unspecified: Secondary | ICD-10-CM | POA: Diagnosis not present

## 2017-08-08 DIAGNOSIS — Z813 Family history of other psychoactive substance abuse and dependence: Secondary | ICD-10-CM | POA: Diagnosis not present

## 2017-08-08 DIAGNOSIS — F17210 Nicotine dependence, cigarettes, uncomplicated: Secondary | ICD-10-CM | POA: Diagnosis not present

## 2017-08-08 MED ORDER — FLUPHENAZINE DECANOATE 25 MG/ML IJ SOLN: 37.5000 mg | INTRAMUSCULAR | 2 refills | Status: DC

## 2017-08-08 MED ORDER — FLUPHENAZINE DECANOATE 25 MG/ML IJ SOLN
37.5000 mg | INTRAMUSCULAR | Status: DC
  Administered 2017-08-08 – 2018-02-07 (×14): 37.5 mg via INTRAMUSCULAR

## 2017-08-08 MED ORDER — HYDROXYZINE HCL 10 MG PO TABS: 10.0000 mg | ORAL_TABLET | Freq: Every day | ORAL | 0 refills | Status: DC

## 2017-08-08 NOTE — Progress Notes (Signed)
Patient presented with flat affect, level mood and denied and auditory or visual hallucinations, no suicidal or homicidal ideations. Patient expressed frustration over his medications, he does not want anymore added and he is not liking the Prolixin due to muscle aches and pain.Patient's due Prolixin Decanoate 37.5 mg/1.5 ml IM injection every 2 weeks prepared as ordered and administered to patient in [place] area. Patient tolerated due injection without complaint of pain or discomfort and agreed to return in 2 weeks for next due injection. Patient to call if any problems prior to next appointment.

## 2017-08-08 NOTE — Progress Notes (Signed)
BH MD/PA/NP OP Progress Note  08/08/2017 10:09 AM [name]  MRN:  [id number]  Chief Complaint: I am taking my medication.  HPI: Wayne Schmidt came for his follow-up appointment.  He is compliant with his Prolixin injection.  He does not take hydroxyzine even though he has mild tremors in his hand.  In the past we have talked about [place] not interested to add any more medication.  He endorses tremor does not interfere in his daily life.  He is happy his hemoglobin A1c is improved.  He had blood work yesterday and hemoglobin A1c was 6.9.  He endorsed sleeping not good but her perception is that medicine causing insomnia.  He denies any agitation, anger, aggressive behavior.  He had a good Christmas.  He spent time with his 82 year old daughter.  He may move to [place] area because of his of smoking.  As per staff he has busted few times where he lives and now  [facility] trying to locate him in [place] area and apartment where he can smoke.  Patient denies any suicidal thoughts or homicidal thought.  His energy level is fair.  His vital signs are stable.    Visit Diagnosis:    ICD-10-CM   1. 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 Patient has a long history of psychiatric illness with multiple hospitalization. He recall at least 16-20 times hospitalized. He was [facility] for psychosis hallucination and paranoia. In the past he had tried Haldol and Thorazine but endorse that he was allergic to these medication. He is taking Prolixin injection for past 30 years. He has at least 2 psychiatric admission because of suicidal attempt when he took overdose on his medication.  Past Medical History:  Past Medical History:  Diagnosis Date  . Diabetes mellitus without complication ([place])   . High risk sexual behavior 10/2007  .  Hypertension   . Microcytosis   . Schizophrenia ([place])   . Substance abuse ([place])    tobacco use    Past Surgical History:  Procedure Laterality Date  . LAMINECTOMY     by Dr Rolin Barry    Family Psychiatric History: Reviewed.  Family History:  Family History  Problem Relation Age of Onset  . Diabetes Mother   . Hypertension Mother   . Lupus Sister   . Sickle cell anemia Brother   . Drug abuse Brother     Social History:  Social History   Socioeconomic History  . Marital status: Legally Separated    Spouse name: Not on file  . Number of children: Not on file  . Years of education: Not on file  . Highest education level: Not on file  Social Needs  . Financial resource strain: Not on file  . Food insecurity - worry: Not on file  . Food insecurity - inability: Not on file  . Transportation needs - medical: Not on file  . Transportation needs - non-medical: Not on file  Occupational History  . Not on file  Tobacco Use  . Smoking status: Current Every Day Smoker    Packs/day: 1.50    Years: 38.00    Pack years: 57.00    Types: Cigarettes  . Smokeless tobacco: Never Used  . Tobacco comment: smoking more   Substance and Sexual Activity  . Alcohol use: No    Alcohol/week: 0.0 oz    Comment: Occasional  use   . Drug use: No    Comment: h/o remote cocaine use-denies present use  . Sexual activity: Not Currently  Other Topics Concern  . Not on file  Social History Narrative   Patient given diabetes card 07/07/2010    Allergies:  Allergies  Allergen Reactions  . Ace Inhibitors Cough  . Atorvastatin Other (See Comments)    Dizzy / Faint  . Chlorpromazine Hcl Other (See Comments)    Passed out   . Simvastatin Other (See Comments)    Dizzy / Faint    Metabolic Disorder Labs: Lab Results  Component Value Date   HGBA1C 6.9 08/07/2017   MPG 163 08/24/2016   No results found for: PROLACTIN Lab Results  Component Value Date   CHOL 159 12/12/2016   TRIG 61  12/12/2016   HDL 62 12/12/2016   CHOLHDL 2.6 12/12/2016   VLDL 11 04/01/2014   LDLCALC 85 12/12/2016   LDLCALC 87 01/11/2016   Lab Results  Component Value Date   TSH 1.470 12/12/2016   TSH 1.647 10/29/2008    Therapeutic Level Labs: No results found for: LITHIUM No results found for: VALPROATE No components found for:  CBMZ  Current Medications: Current Outpatient Medications  Medication Sig Dispense Refill  . doxazosin (CARDURA) 4 MG tablet TAKE 1 TABLET BY MOUTH EVERY DAY 90 tablet 3  . fluPHENAZine decanoate (PROLIXIN) 25 MG/ML injection Inject 1.5 mLs (37.5 mg total) into the muscle every 14 (fourteen) days. 5 mL 2  . hydroxypropyl methylcellulose / hypromellose (ISOPTO TEARS / GONIOVISC) 2.5 % ophthalmic solution Place 2 drops into both eyes daily as needed for dry eyes.    . hydrOXYzine (ATARAX/VISTARIL) 10 MG tablet Take 1 tablet (10 mg total) by mouth at bedtime. 90 tablet 0  . JARDIANCE 10 MG TABS tablet TAKE 10 MG BY MOUTH DAILY. 90 tablet 1  . losartan (COZAAR) 100 MG tablet TAKE 1 TABLET BY MOUTH EVERY DAY 90 tablet 3  . pravastatin (PRAVACHOL) 40 MG tablet TAKE 1 TABLET (40 MG TOTAL) BY MOUTH DAILY. 90 tablet 3  . tamsulosin (FLOMAX) 0.4 MG CAPS capsule Take 0.4 mg by mouth daily.     No current facility-administered medications for this visit.      Musculoskeletal: Strength & Muscle Tone: within normal limits Gait & Station: normal Patient leans: N/A  Psychiatric Specialty Exam: ROS  Blood pressure 130/76, pulse 87, height 6' 0.5" (1.842 m), weight 231 lb 6.4 oz (105 kg).There is no height or weight on file to calculate BMI.  General Appearance: Casual and    Eye Contact:  Fair  Speech:  Slow  Volume:  Decreased  Mood:  Euthymic  Affect:  Flat  Thought Process:  Descriptions of Associations: Circumstantial  Orientation:  Full (Time, Place, and Person)  Thought Content: Rumination   Suicidal Thoughts:  No  Homicidal Thoughts:  No  Memory:  Immediate;    Fair Recent;   Fair Remote;   Fair  Judgement:  Good  Insight:  Good  Psychomotor Activity:  Decreased and Tremor  Concentration:  Concentration: Fair and Attention Span: Fair  Recall:  [employer] of Knowledge: Fair  Language: Fair  Akathisia:  No  Handed:  Right  AIMS (if indicated): not done  Assets:  Communication Skills Desire for Improvement Housing  ADL's:  Intact  Cognition: WNL  Sleep:  Good   Screenings: PHQ2-9     Office Visit from 12/12/2016 in [facility] Visit  from 09/05/2016 in [facility] Visit from 08/29/2016 in [facility] Office Visit from 04/11/2016 in [facility] Office Visit from 10/13/2015 in [facility]  PHQ-2 Total Score  0  0  0  0  0       Assessment and Plan: Paranoid schizophrenia chronic.  Patient is stable on his current psychiatric medication.  He is reluctant to take any medication to help his tremors.  I reviewed blood work results and recent collect information from his physician.  His hemoglobin A1c is improved.  Encourage to watch his calorie intake and regular exercise.  Continue Prolixin Decanoate 37.5 mg IM every 2 weeks.  One more time encouraged to take Atarax to help his tremors.  Recommended to call us back if he has any question or any concern.  Follow-up in 3 months.   [name], MD 08/08/2017, 10:09 AM

## 2017-08-23 ENCOUNTER — Ambulatory Visit (INDEPENDENT_AMBULATORY_CARE_PROVIDER_SITE_OTHER): Payer: Medicare Other

## 2017-08-23 ENCOUNTER — Encounter (HOSPITAL_COMMUNITY): Payer: Self-pay

## 2017-08-23 DIAGNOSIS — F200 Paranoid schizophrenia: Secondary | ICD-10-CM | POA: Diagnosis not present

## 2017-08-23 NOTE — Progress Notes (Signed)
Patient presented with flat affect, level mood but did state some increased anxiety due to having to move our of his current apartment, one he has been in for 27 years due to he was caught smoking in the apartment.  Patient reports not ready to stop smoking completely but that he has cut back and admitted to smoking in the apartment as states he has back pain at times and does not walk outside of the building.  Patient reported working with the [employer] and family to find a new place that will allow smoking but none identified yet and does not want to have to move to [place] or to live with family.  Patient reported no problems with current medications and denied any auditory or visual hallucinations, no suicidal or homicidal ideations and feeling physically better since his blood pressure has been better as of late.  Patient's due Prolixin Decanoate 37.5 mg/1.5 ml IM injection prepared as ordered and given to patient in his left deltoid area.  Patient tolerated due injection without complaint of pain or discomfort and will return in 2 weeks for next injection.  Patient agreed to call if any problems prior to then and will keep informed if moves prior to next appointment.

## 2017-09-06 ENCOUNTER — Ambulatory Visit (INDEPENDENT_AMBULATORY_CARE_PROVIDER_SITE_OTHER): Payer: Medicare Other

## 2017-09-06 DIAGNOSIS — F200 Paranoid schizophrenia: Secondary | ICD-10-CM

## 2017-09-06 NOTE — Progress Notes (Signed)
Patient presented with flat affect, level mood  Patient reported he decided to move in with his daughter and that seems to be working out well..  Patient reported no problems with current medications and denied any auditory or visual hallucinations, no suicidal or homicidal ideations.. Patient's due Prolixin Decanoate 37.5 mg/1.5 ml IM injection prepared as ordered and given to patient in his right deltoid area.  Patient tolerated due injection without complaint of pain or discomfort and will return in 2 weeks for next injection.  Patient agreed to call if any problems prior to then and will keep informed if moves prior to next appointment.

## 2017-09-20 ENCOUNTER — Ambulatory Visit (INDEPENDENT_AMBULATORY_CARE_PROVIDER_SITE_OTHER): Payer: Medicare Other

## 2017-09-20 DIAGNOSIS — F200 Paranoid schizophrenia: Secondary | ICD-10-CM | POA: Diagnosis not present

## 2017-09-20 NOTE — Progress Notes (Signed)
Patient presented with flat affect, level mood. Patient reported no problems with current medications and denied any auditory or visual hallucinations, no suicidal or homicidal ideations.. Patient's due Prolixin Decanoate 37.5 mg/1.5 ml IM injection prepared as ordered and given to patient in his left deltoid area. Patient tolerated due injection without complaint of pain or discomfort and will return in 2 weeks for next injection. Patient agreed to call if any problems prior to then and will keep informed if moves prior to next appointment.

## 2017-09-25 ENCOUNTER — Encounter (HOSPITAL_COMMUNITY): Payer: Self-pay | Admitting: Emergency Medicine

## 2017-09-25 ENCOUNTER — Other Ambulatory Visit: Payer: Self-pay

## 2017-09-25 ENCOUNTER — Ambulatory Visit (HOSPITAL_COMMUNITY)
Admission: EM | Admit: 2017-09-25 | Discharge: 2017-09-25 | Disposition: A | Discharge status: Home / Self Care | Payer: Medicare Other | Attending: Family Medicine | Admitting: Family Medicine

## 2017-09-25 DIAGNOSIS — J0100 Acute maxillary sinusitis, unspecified: Secondary | ICD-10-CM

## 2017-09-25 MED ORDER — AMOXICILLIN-POT CLAVULANATE 875-125 MG PO TABS: 1.0000 | ORAL_TABLET | Freq: Two times a day (BID) | ORAL | 0 refills | Status: DC

## 2017-09-25 NOTE — ED Provider Notes (Signed)
[facility]   [id number] 09/25/17 Arrival Time: 5784  ASSESSMENT & PLAN:  1. Acute non-recurrent maxillary sinusitis     Meds ordered this encounter  Medications  . amoxicillin-clavulanate (AUGMENTIN) 875-125 MG tablet    Sig: Take 1 tablet by mouth every 12 (twelve) hours.    Dispense:  20 tablet    Refill:  0   Discussed typical duration of symptoms. OTC symptom care as needed. Ensure adequate fluid intake and rest. May f/u with PCP or here as needed.  Reviewed expectations re: course of current medical issues. Questions answered. Outlined signs and symptoms indicating need for more acute intervention. Patient verbalized understanding. After Visit Summary given.   SUBJECTIVE: History from: patient.  Wayne Schmidt is a 62 y.o. male who presents with complaint of nasal congestion, post-nasal drainage, and a persistent dry cough. Onset abrupt, approximately 1 week ago. Sinus pressure now bothering him the most. SOB: none. Wheezing: none. Fever: no. Overall normal PO intake without n/v. Sick contacts: no. OTC treatment: various cold medicines without much relief.  Social History   Tobacco Use  Smoking Status Current Every Day Smoker  . Packs/day: 1.50  . Years: 38.00  . Pack years: 57.00  . Types: Cigarettes  Smokeless Tobacco Never Used  Tobacco Comment   Reports has cut back but not ready to quit   ROS: As per HPI.   OBJECTIVE:  Vitals:   09/25/17 1235  BP: 105/69  Pulse: (!) 108  Temp: 98.5 F (36.9 C)  TempSrc: Oral  SpO2: 98%    General appearance: alert; appears fatigued HEENT: nasal congestion; clear runny nose; throat irritation secondary to post-nasal drainage; bilateral maxillary sinus tenderness to palpation; turbinates boggy Neck: supple without LAD Lungs: unlabored respirations, symmetrical air entry; cough: absent; no respiratory distress Skin: warm and dry Psychological: alert and cooperative; normal mood and  affect  Allergies  Allergen Reactions  . Ace Inhibitors Cough  . Atorvastatin Other (See Comments)    Dizzy / Faint  . Chlorpromazine Hcl Other (See Comments)    Passed out   . Simvastatin Other (See Comments)    Dizzy / Faint    Past Medical History:  Diagnosis Date  . Diabetes mellitus without complication ([place])   . High risk sexual behavior 10/2007  . Hypertension   . Microcytosis   . Schizophrenia ([place])   . Substance abuse ([place])    tobacco use   Family History  Problem Relation Age of Onset  . Diabetes Mother   . Hypertension Mother   . Lupus Sister   . Sickle cell anemia Brother   . Drug abuse Brother    Social History   Socioeconomic History  . Marital status: Legally Separated    Spouse name: Not on file  . Number of children: Not on file  . Years of education: Not on file  . Highest education level: Not on file  Occupational History  . Not on file  Social Needs  . Financial resource strain: Not on file  . Food insecurity:    Worry: Not on file    Inability: Not on file  . Transportation needs:    Medical: Not on file    Non-medical: Not on file  Tobacco Use  . Smoking status: Current Every Day Smoker    Packs/day: 1.50    Years: 38.00    Pack years: 57.00    Types: Cigarettes  . Smokeless tobacco: Never Used  . Tobacco comment: Reports has cut back  but not ready to quit  Substance and Sexual Activity  . Alcohol use: No    Alcohol/week: 0.0 oz    Comment: Occasional use   . Drug use: No    Comment: h/o remote cocaine use-denies present use  . Sexual activity: Not Currently  Lifestyle  . Physical activity:    Days per week: Not on file    Minutes per session: Not on file  . Stress: Not on file  Relationships  . Social connections:    Talks on phone: Not on file    Gets together: Not on file    Attends religious service: Not on file    Active member of club or organization: Not on file    Attends meetings of clubs or organizations: Not  on file    Relationship status: Not on file  . Intimate partner violence:    Fear of current or ex partner: Not on file    Emotionally abused: Not on file    Physically abused: Not on file    Forced sexual activity: Not on file  Other Topics Concern  . Not on file  Social History Narrative   Patient given diabetes card 07/07/2010           Vanessa Kick, MD 09/26/17 1007

## 2017-09-25 NOTE — ED Triage Notes (Signed)
Pt reports nasal congestion and pain and cough for over one week.  Pt has been taking OTC medications with no relief.

## 2017-10-04 ENCOUNTER — Ambulatory Visit (INDEPENDENT_AMBULATORY_CARE_PROVIDER_SITE_OTHER): Payer: Medicare Other

## 2017-10-04 DIAGNOSIS — F200 Paranoid schizophrenia: Secondary | ICD-10-CM | POA: Diagnosis not present

## 2017-10-04 NOTE — Progress Notes (Signed)
Patient presented with flat affect, level mood. Patient reported no problems with current medications and denied any auditory or visual hallucinations, no suicidal or homicidal ideations.. Patient's due Prolixin Decanoate 37.5 mg/1.5 ml IM injection prepared as ordered and given to patient in [place] area. Patient tolerated due injection without complaint of pain or discomfort and will return in 2 weeks for next injection. Patient agreed to call if any problems prior to then and will keep informed if moves prior to next appointment.

## 2017-10-18 ENCOUNTER — Ambulatory Visit (INDEPENDENT_AMBULATORY_CARE_PROVIDER_SITE_OTHER): Payer: Medicare Other

## 2017-10-18 DIAGNOSIS — F200 Paranoid schizophrenia: Secondary | ICD-10-CM | POA: Diagnosis not present

## 2017-10-18 NOTE — Progress Notes (Signed)
Patient presented with flat affect, level mood. Patient reported no problems with current medications and denied any auditory or visual hallucinations, no suicidal or homicidal ideations.. Patient's due Prolixin Decanoate 37.5 mg/1.5 ml IM injection prepared as ordered and given to patient in hisleftdeltoid area. Patient tolerated due injection without complaint of pain or discomfort and will return in 2 weeks for next injection. Patient agreed to call if any problems prior to then and will keep informed if moves prior to next appointment.

## 2017-11-01 ENCOUNTER — Ambulatory Visit (INDEPENDENT_AMBULATORY_CARE_PROVIDER_SITE_OTHER): Payer: Medicare Other

## 2017-11-01 DIAGNOSIS — F200 Paranoid schizophrenia: Secondary | ICD-10-CM | POA: Diagnosis not present

## 2017-11-01 NOTE — Progress Notes (Signed)
Patient presented today with flat affect and appropriate mood for his Prolixin injection. Patient has been staying with his daughter and this has been working out well. The injection of Prolixin 37.5mg  was prepared as ordered and administered in the patients right bicep. Patient tolerated well and will be back in 2 weeks for his next injection

## 2017-11-05 ENCOUNTER — Ambulatory Visit (INDEPENDENT_AMBULATORY_CARE_PROVIDER_SITE_OTHER): Payer: Medicare Other | Admitting: Psychiatry

## 2017-11-05 DIAGNOSIS — F200 Paranoid schizophrenia: Secondary | ICD-10-CM | POA: Diagnosis not present

## 2017-11-05 DIAGNOSIS — F17210 Nicotine dependence, cigarettes, uncomplicated: Secondary | ICD-10-CM | POA: Diagnosis not present

## 2017-11-05 DIAGNOSIS — M2550 Pain in unspecified joint: Secondary | ICD-10-CM | POA: Diagnosis not present

## 2017-11-05 DIAGNOSIS — M549 Dorsalgia, unspecified: Secondary | ICD-10-CM

## 2017-11-05 DIAGNOSIS — Z813 Family history of other psychoactive substance abuse and dependence: Secondary | ICD-10-CM | POA: Diagnosis not present

## 2017-11-05 DIAGNOSIS — R251 Tremor, unspecified: Secondary | ICD-10-CM

## 2017-11-05 MED ORDER — FLUPHENAZINE DECANOATE 25 MG/ML IJ SOLN: 37.5000 mg | INTRAMUSCULAR | 2 refills | Status: DC

## 2017-11-05 NOTE — Progress Notes (Signed)
BH MD/PA/NP OP Progress Note  11/05/2017 12:44 PM Wayne Schmidt  MRN:  [id number]  Chief Complaint: I am doing good.  I am sleeping okay.  HPI: Wayne Schmidt came for his follow-up appointment.  He is receiving Prolixin injection every 2 weeks.  He denies any paranoia, hallucination or any aggressive behavior.  He does not want to take Vistaril even though he had tremors.  He also does not like to take Cogentin.  He is watching his calorie intake and he is pleased to see his hemoglobin A1c dropped from 7.5-6.9.  He is more focus on healthy food.  He is sleeping good.  His energy level is okay.  He denies any paranoia, hallucination, suicidal thoughts or homicidal thought.  He denies drinking alcohol or using any illegal substances.    Visit Diagnosis:    ICD-10-CM   1. Paranoid schizophrenia, subchronic condition (HCC) F20.0 fluPHENAZine decanoate (PROLIXIN) 25 MG/ML injection    Past Psychiatric History: Reviewed. Patient has a long history of psychiatric illness with multiple hospitalization. He recall at least 16-20 times hospitalized. He was [facility] for psychosis hallucination and paranoia. In the past he had tried Haldol and Thorazine but endorse that he was allergic to these medication. He is taking Prolixin injection for past 30 years. He has at least 2 psychiatric admission because of suicidal attempt when he took overdose on his medication.  Past Medical History:  Past Medical History:  Diagnosis Date  . Diabetes mellitus without complication ([place])   . High risk sexual behavior 10/2007  . Hypertension   . Microcytosis   . Schizophrenia ([place])   . Substance abuse ([place])    tobacco use    Past Surgical History:  Procedure Laterality Date  . LAMINECTOMY     by Dr Rolin Barry    Family Psychiatric History: Reviewed.  Family History:  Family History  Problem Relation Age of Onset  . Diabetes Mother   . Hypertension Mother   . Lupus Sister   . Sickle cell anemia  Brother   . Drug abuse Brother     Social History:  Social History   Socioeconomic History  . Marital status: Legally Separated    Spouse name: Not on file  . Number of children: Not on file  . Years of education: Not on file  . Highest education level: Not on file  Occupational History  . Not on file  Social Needs  . Financial resource strain: Not on file  . Food insecurity:    Worry: Not on file    Inability: Not on file  . Transportation needs:    Medical: Not on file    Non-medical: Not on file  Tobacco Use  . Smoking status: Current Every Day Smoker    Packs/day: 1.50    Years: 38.00    Pack years: 57.00    Types: Cigarettes  . Smokeless tobacco: Never Used  . Tobacco comment: Reports has cut back but not ready to quit  Substance and Sexual Activity  . Alcohol use: No    Alcohol/week: 0.0 oz    Comment: Occasional use   . Drug use: No    Comment: h/o remote cocaine use-denies present use  . Sexual activity: Not Currently  Lifestyle  . Physical activity:    Days per week: Not on file    Minutes per session: Not on file  . Stress: Not on file  Relationships  . Social connections:    Talks on phone: Not  on file    Gets together: Not on file    Attends religious service: Not on file    Active member of club or organization: Not on file    Attends meetings of clubs or organizations: Not on file    Relationship status: Not on file  Other Topics Concern  . Not on file  Social History Narrative   Patient given diabetes card 07/07/2010    Allergies:  Allergies  Allergen Reactions  . Ace Inhibitors Cough  . Atorvastatin Other (See Comments)    Dizzy / Faint  . Chlorpromazine Hcl Other (See Comments)    Passed out   . Simvastatin Other (See Comments)    Dizzy / Faint    Metabolic Disorder Labs: Lab Results  Component Value Date   HGBA1C 6.9 08/07/2017   MPG 163 08/24/2016   No results found for: PROLACTIN Lab Results  Component Value Date   CHOL  159 12/12/2016   TRIG 61 12/12/2016   HDL 62 12/12/2016   CHOLHDL 2.6 12/12/2016   VLDL 11 04/01/2014   LDLCALC 85 12/12/2016   LDLCALC 87 01/11/2016   Lab Results  Component Value Date   TSH 1.470 12/12/2016   TSH 1.647 10/29/2008    Therapeutic Level Labs: No results found for: LITHIUM No results found for: VALPROATE No components found for:  CBMZ  Current Medications: Current Outpatient Medications  Medication Sig Dispense Refill  . amoxicillin-clavulanate (AUGMENTIN) 875-125 MG tablet Take 1 tablet by mouth every 12 (twelve) hours. 20 tablet 0  . doxazosin (CARDURA) 4 MG tablet TAKE 1 TABLET BY MOUTH EVERY DAY 90 tablet 3  . fluPHENAZine decanoate (PROLIXIN) 25 MG/ML injection Inject 1.5 mLs (37.5 mg total) into the muscle every 14 (fourteen) days. 5 mL 2  . hydroxypropyl methylcellulose / hypromellose (ISOPTO TEARS / GONIOVISC) 2.5 % ophthalmic solution Place 2 drops into both eyes daily as needed for dry eyes.    . hydrOXYzine (ATARAX/VISTARIL) 10 MG tablet Take 1 tablet (10 mg total) by mouth at bedtime. 90 tablet 0  . JARDIANCE 10 MG TABS tablet TAKE 10 MG BY MOUTH DAILY. 90 tablet 1  . losartan (COZAAR) 100 MG tablet TAKE 1 TABLET BY MOUTH EVERY DAY 90 tablet 3  . pravastatin (PRAVACHOL) 40 MG tablet TAKE 1 TABLET (40 MG TOTAL) BY MOUTH DAILY. 90 tablet 3  . tamsulosin (FLOMAX) 0.4 MG CAPS capsule Take 0.4 mg by mouth daily.     Current Facility-Administered Medications  Medication Dose Route Frequency Provider Last Rate Last Dose  . fluPHENAZine decanoate (PROLIXIN) injection 37.5 mg  37.5 mg Intramuscular Q14 Days Jansen Sciuto, Arlyce Harman, MD   37.5 mg at 11/01/17 1509     Musculoskeletal: Strength & Muscle Tone: within normal limits Gait & Station: normal Patient leans: N/A  Psychiatric Specialty Exam: Review of Systems  Musculoskeletal: Positive for back pain and joint pain.  Neurological: Positive for tingling and tremors.    There were no vitals taken for this  visit.There is no height or weight on file to calculate BMI.  General Appearance: Casual  Eye Contact:  Fair  Speech:  Slow  Volume:  Decreased  Mood:  Euthymic  Affect:  Flat  Thought Process:  Descriptions of Associations: Circumstantial  Orientation:  Full (Time, Place, and Person)  Thought Content: Rumination   Suicidal Thoughts:  No  Homicidal Thoughts:  No  Memory:  Immediate;   Fair Recent;   Fair Remote;   Fair  Judgement:  Good  Insight:  Good  Psychomotor Activity:  Decreased and Tremor  Concentration:  Concentration: Fair and Attention Span: Fair  Recall:  [employer] of Knowledge: Fair  Language: Fair  Akathisia:  No  Handed:  Right  AIMS (if indicated): not done  Assets:  Communication Skills Housing Resilience  ADL's:  Intact  Cognition: WNL  Sleep:  Good   Screenings: PHQ2-9     Office Visit from 12/12/2016 in [place] Office Visit from 09/05/2016 in [place] Office Visit from 08/29/2016 in [place] Office Visit from 04/11/2016 in [place] Office Visit from 10/13/2015 in [place]  PHQ-2 Total Score  0  0  0  0  0       Assessment and Plan: Schizophrenia chronic paranoid type.  Patient is a stable on Prolixin decannulate.  I will discontinue hydroxyzine he is not taking it.  I reviewed blood work results.  His hemoglobin A1c improved from the past.  Encourage healthy lifestyle and watch his calorie intake.  Continue Prolixin 37.5 mg intramuscular every 2 weeks.  Recommended to call us back if he has any question or any concern.  Follow-up in 3 months.   [name], MD 11/05/2017, 12:44 PM

## 2017-11-07 ENCOUNTER — Other Ambulatory Visit: Payer: Self-pay | Admitting: Internal Medicine

## 2017-11-15 ENCOUNTER — Ambulatory Visit (INDEPENDENT_AMBULATORY_CARE_PROVIDER_SITE_OTHER): Payer: Medicare Other

## 2017-11-15 ENCOUNTER — Encounter (HOSPITAL_COMMUNITY): Payer: Self-pay

## 2017-11-15 DIAGNOSIS — F200 Paranoid schizophrenia: Secondary | ICD-10-CM

## 2017-11-15 MED ORDER — FLUPHENAZINE DECANOATE 25 MG/ML IJ SOLN: 37.5000 mg | INTRAMUSCULAR | 2 refills | Status: DC

## 2017-11-15 NOTE — Progress Notes (Signed)
Patient presented today with flat affect and appropriate mood for his Prolixin  . Patient has been staying with his daughter and this has been working out well. The injection of Prolixin 37.5mg  was prepared as ordered and administered in the patients left bicep. Patient tolerated well and will be back in 2 weeks for his next injection

## 2017-11-27 ENCOUNTER — Ambulatory Visit (INDEPENDENT_AMBULATORY_CARE_PROVIDER_SITE_OTHER): Payer: Medicare Other | Admitting: Internal Medicine

## 2017-11-27 ENCOUNTER — Other Ambulatory Visit: Payer: Self-pay

## 2017-11-27 ENCOUNTER — Encounter: Payer: Self-pay | Admitting: Internal Medicine

## 2017-11-27 ENCOUNTER — Ambulatory Visit (HOSPITAL_COMMUNITY)
Admission: RE | Admit: 2017-11-27 | Discharge: 2017-11-27 | Disposition: A | Discharge status: Home / Self Care | Payer: Medicare Other | Source: Ambulatory Visit | Attending: Internal Medicine | Admitting: Internal Medicine

## 2017-11-27 VITALS — BP 117/72 | HR 76 | Temp 98.3°F | Ht 72.0 in | Wt 225.1 lb

## 2017-11-27 DIAGNOSIS — I10 Essential (primary) hypertension: Secondary | ICD-10-CM

## 2017-11-27 DIAGNOSIS — M47816 Spondylosis without myelopathy or radiculopathy, lumbar region: Secondary | ICD-10-CM | POA: Insufficient documentation

## 2017-11-27 DIAGNOSIS — E119 Type 2 diabetes mellitus without complications: Secondary | ICD-10-CM

## 2017-11-27 DIAGNOSIS — N401 Enlarged prostate with lower urinary tract symptoms: Secondary | ICD-10-CM | POA: Diagnosis not present

## 2017-11-27 DIAGNOSIS — Z7984 Long term (current) use of oral hypoglycemic drugs: Secondary | ICD-10-CM | POA: Diagnosis not present

## 2017-11-27 DIAGNOSIS — E785 Hyperlipidemia, unspecified: Secondary | ICD-10-CM

## 2017-11-27 DIAGNOSIS — I700 Atherosclerosis of aorta: Secondary | ICD-10-CM | POA: Insufficient documentation

## 2017-11-27 DIAGNOSIS — Z79899 Other long term (current) drug therapy: Secondary | ICD-10-CM | POA: Diagnosis not present

## 2017-11-27 DIAGNOSIS — F200 Paranoid schizophrenia: Secondary | ICD-10-CM

## 2017-11-27 DIAGNOSIS — R351 Nocturia: Secondary | ICD-10-CM

## 2017-11-27 LAB — POCT GLYCOSYLATED HEMOGLOBIN (HGB A1C): HEMOGLOBIN A1C: 6.7 % — AB (ref 4.0–5.6)

## 2017-11-27 LAB — GLUCOSE, CAPILLARY: GLUCOSE-CAPILLARY: 136 mg/dL — AB (ref 65–99)

## 2017-11-27 MED ORDER — GABAPENTIN 300 MG PO CAPS: 300.0000 mg | ORAL_CAPSULE | Freq: Every day | ORAL | 0 refills | Status: DC

## 2017-11-27 NOTE — Assessment & Plan Note (Signed)
-  This problem is chronic and stable -Will continue pravastatin 40 mg daily -We will check repeat lipid profile today

## 2017-11-27 NOTE — Assessment & Plan Note (Signed)
-  Patient presents today with left-sided lower extremity pain.  Patient states that the pain radiates from the back of his left buttock down to his left foot and is associated with tingling and numbness -This appears to be a neuropathic pain likely secondary to his lumbar radiculopathy -Patient has a negative straight leg raising test on both sides -Repeat x-rays of the lumbar spine done today showed mild degenerative changes at L4-L5 similar to his previous exam -Start patient on low-dose Neurontin (300 mg at bedtime) -If pain persists would consider getting an MRI of his lumbar spine

## 2017-11-27 NOTE — Progress Notes (Signed)
   Subjective:    Patient ID: Wayne Schmidt, male    DOB: [birth year], 62 y.o.   MRN: [id number]  HPI  I have seen and examined this patient.  Patient is here for routine follow-up of his diabetes and hypertension.  Patient does complain of left lower extremity pain.  Patient states that he has intermittent pain radiating from his left buttocks down to his foot.  Patient complains of intermittent tingling and numbness in his left lower extremity as well.  Patient had similar complaints in the past but this had resolved.  He is compliant with all his medications and denies any other complaints at this time.   Review of Systems  Constitutional: Negative.   HENT: Negative.   Respiratory: Negative.   Cardiovascular: Negative.   Gastrointestinal: Negative.   Musculoskeletal:       Patient complains of left sided neuropathic pain radiating from his left buttocks down to his left foot   Neurological: Negative.   Psychiatric/Behavioral: Negative.        Objective:   Physical Exam  Constitutional: He is oriented to person, place, and time. He appears well-developed and well-nourished.  HENT:  Head: Normocephalic and atraumatic.  Mouth/Throat: No oropharyngeal exudate.  Neck: Neck supple.  Cardiovascular: Normal rate and regular rhythm.  No murmur heard. Pulmonary/Chest: Effort normal and breath sounds normal.  Abdominal: Soft. Bowel sounds are normal. He exhibits no distension. There is no tenderness.  Musculoskeletal: Normal range of motion. He exhibits no edema.  Patient has a normal bilaterally straight leg raising test bilaterally  Lymphadenopathy:    He has no cervical adenopathy.  Neurological: He is alert and oriented to person, place, and time.  Psychiatric: His behavior is normal.  Flat affect          Assessment & Plan:  Please see problem based charting for assessment and plan:

## 2017-11-27 NOTE — Assessment & Plan Note (Signed)
-  This problem is chronic and stable -Patient follows up with psychiatry as an outpatient -We will continue with Prolixin injections per psychiatry for now -No further work-up at this time -Patient will need close follow-up with psychiatry

## 2017-11-27 NOTE — Patient Instructions (Signed)
-  It was a pleasure seeing you today -We will check x-rays of your lower back today.  I will start on medication for your nerve pain to take at night -We will consider an MRI of your lumbar spine depending on the results of your x-rays -We will check some blood work today -Your diabetes is well controlled.  Keep up the great work! -Your blood pressure is well controlled as well -Please follow-up with me in 3 months

## 2017-11-27 NOTE — Assessment & Plan Note (Signed)
BP Readings from Last 3 Encounters:  11/27/17 117/72  09/25/17 105/69  08/23/17 134/78    Lab Results  Component Value Date   NA 141 12/12/2016   K 4.5 12/12/2016   CREATININE 1.25 12/12/2016    Assessment: Blood pressure control:   Well-controlled Progress toward BP goal:   At goal Comments: Patient is compliant with losartan 100 mg and Cardura 4 mg  Plan: Medications:  continue current medications Educational resources provided:   Self management tools provided:   Other plans: We will check BMP today

## 2017-11-27 NOTE — Assessment & Plan Note (Signed)
Lab Results  Component Value Date   HGBA1C 6.7 (A) 11/27/2017   HGBA1C 6.9 08/07/2017   HGBA1C 7.5 03/20/2017     Assessment: Diabetes control:  Well-controlled Progress toward A1C goal:   At goal Comments: Patient is compliant with Jardiance 10 mg daily  Plan: Medications:  continue current medications Home glucose monitoring: Frequency:   Timing:   Instruction/counseling given: discussed diet Educational resources provided:   Self management tools provided:   Other plans: We will check BMP today

## 2017-11-27 NOTE — Assessment & Plan Note (Signed)
-  This problem is chronic and stable -Patient follows up with urology as an outpatient for This -We will continue with tamsulosin for now.  Patient symptoms are well controlled on medication -No further work-up at this time

## 2017-11-28 LAB — LIPID PANEL
CHOLESTEROL TOTAL: 172 mg/dL (ref 100–199)
Chol/HDL Ratio: 2.7 ratio (ref 0.0–5.0)
HDL: 64 mg/dL (ref >39)
LDL Calculated: 96 mg/dL (ref 0–99)
TRIGLYCERIDES: 60 mg/dL (ref 0–149)
VLDL Cholesterol Cal: 12 mg/dL (ref 5–40)

## 2017-11-28 LAB — BMP8+ANION GAP
Anion Gap: 15 mmol/L (ref 10.0–18.0)
BUN/Creatinine Ratio: 14 (ref 10–24)
BUN: 16 mg/dL (ref 8–27)
CALCIUM: 9.5 mg/dL (ref 8.6–10.2)
CO2: 22 mmol/L (ref 20–29)
CREATININE: 1.16 mg/dL (ref 0.76–1.27)
Chloride: 104 mmol/L (ref 96–106)
GFR, EST AFRICAN AMERICAN: 77 mL/min/{1.73_m2} (ref >59)
GFR, EST NON AFRICAN AMERICAN: 67 mL/min/{1.73_m2} (ref >59)
Glucose: 95 mg/dL (ref 65–99)
Potassium: 4.5 mmol/L (ref 3.5–5.2)
Sodium: 141 mmol/L (ref 134–144)

## 2017-11-28 LAB — MICROALBUMIN / CREATININE URINE RATIO: Creatinine, Urine: 27.8 mg/dL

## 2017-11-29 ENCOUNTER — Ambulatory Visit (INDEPENDENT_AMBULATORY_CARE_PROVIDER_SITE_OTHER): Payer: Medicare Other

## 2017-11-29 ENCOUNTER — Encounter: Payer: Self-pay | Admitting: Internal Medicine

## 2017-11-29 DIAGNOSIS — F200 Paranoid schizophrenia: Secondary | ICD-10-CM | POA: Diagnosis not present

## 2017-11-29 NOTE — Progress Notes (Signed)
Patient presented today with flat affect and appropriate mood for his Prolixin. The injection of Prolixin was prepared as ordered and administered in the patients  right bicep.  Patient tolerated well and will be back in 2 weeks for his next injection

## 2017-11-30 ENCOUNTER — Telehealth: Payer: Self-pay

## 2017-11-30 NOTE — Telephone Encounter (Signed)
Requesting xray result. Please call pt back. 

## 2017-11-30 NOTE — Telephone Encounter (Signed)
I called patient to discuss results of his blood work and x-rays with him.  X-rays show only mild degenerative changes at L4-L5 which is similar to his prior x-rays.  Patient states that the gabapentin was helping with the pain but he still has episodes of worsening pain.  Patient will try the gabapentin for a week for 2 and if he does not improve he will follow-up in [facility].  He will likely need an MRI of his lumbar spine if his pain persists.  His blood work was within normal limits.  No further work-up at this time.  Patient expresses understanding and is in agreement with plan.

## 2017-12-04 ENCOUNTER — Ambulatory Visit (INDEPENDENT_AMBULATORY_CARE_PROVIDER_SITE_OTHER): Payer: Medicare Other | Admitting: Internal Medicine

## 2017-12-04 ENCOUNTER — Other Ambulatory Visit: Payer: Self-pay

## 2017-12-04 DIAGNOSIS — Z79899 Other long term (current) drug therapy: Secondary | ICD-10-CM

## 2017-12-04 DIAGNOSIS — M4726 Other spondylosis with radiculopathy, lumbar region: Secondary | ICD-10-CM | POA: Diagnosis not present

## 2017-12-04 DIAGNOSIS — R251 Tremor, unspecified: Secondary | ICD-10-CM

## 2017-12-04 DIAGNOSIS — M47816 Spondylosis without myelopathy or radiculopathy, lumbar region: Secondary | ICD-10-CM

## 2017-12-04 MED ORDER — GABAPENTIN 300 MG PO CAPS: 300.0000 mg | ORAL_CAPSULE | Freq: Three times a day (TID) | ORAL | 0 refills | Status: DC

## 2017-12-04 NOTE — Progress Notes (Signed)
   CC: Follow-up for back pain  HPI:  Mr.Lasalle Hays is a 62 y.o. male with a past medical history listed below here today for follow up of his back pain.   For details of today's visit and the status of his chronic medical issues please refer to the assessment and plan.   Past Medical History:  Diagnosis Date  . Diabetes mellitus without complication ([place])   . High risk sexual behavior 10/2007  . Hypertension   . Microcytosis   . Schizophrenia ([place])   . Substance abuse ([place])    tobacco use   Review of Systems:   Chest pain or shortness of breath  Physical Exam:  Vitals:   12/04/17 1410  BP: (!) 150/85  Pulse: 87  Temp: 98 F (36.7 C)  TempSrc: Oral  SpO2: 100%  Weight: 223 lb 4.8 oz (101.3 kg)   GENERAL- alert, co-operative, appears as stated age, not in any distress. HEENT- Atraumatic, normocephalic CARDIAC- RRR, no murmurs, rubs or gallops. RESP- clear to auscultation bilaterally, no wheezes or crackles. NEURO- resting tremor present MSK: Normal straight leg raising test bilaterally, strength 5 out of 5 in bilateral lower extremities, sensation intact (notes mild decrease in sensation over lateral calf), no edema, normal range of motion, no issues walking PSYCH- flat affect appropriate thought content and speech.   Assessment & Plan:   See Encounters Tab for problem based charting.  Patient discussed with Dr. Lynnae January

## 2017-12-04 NOTE — Assessment & Plan Note (Addendum)
Wayne Schmidt presents today for follow-up of his recent clinic visit with his primary care physician on 11/27/2017.  At that time he complains of left-sided lower extremity pain radiating from the of his left buttock down to his left foot.  He was prescribed gabapentin 300 mg nightly.  X-ray of his lumbar spine showed mild degenerative changes at L4-L5 there was similar to his previous imaging.  Today, he reports that his pain is unchanged.  He notes pain radiating from his left buttock down the lateral aspect of his left leg down into his foot.  He has a difficult time driving this pain but but it appears to be more sharp in nature.  He is also unable to give a clear timeframe for the onset of the pain.  He notes it is been present for years but then reports it has been getting worse over the last several weeks.  He denies any back pain.  No alarm symptoms present today.  He notes some benefit from the gabapentin initially but has not had much relief in the past several days.  He does note he had similar pain right side previously and had steroid injections with resolution of pain.  Patient is requesting MRI today  A/P: As he has no alarm symptoms present today think he would be early to jump to getting an MRI at this point.  Discussed increasing the dose of the gabapentin.  Patient became very upset requesting an MRI and stating "why did I even come to see you."  He then folded up his paperwork and left the office.

## 2017-12-04 NOTE — Patient Instructions (Signed)
Mr. Taboada,  Sorry that you are leg is getting so much trouble.  I agree with Dr. Wilber Bihari assessment that it does sound like it is a nerve pain.  We have a lot of room to go up on that medication that he started and the main side effect is drowsiness.  As long as you are able to tolerate it I think we can increase the dose of that medication to hopefully get the symptoms under control.  If you feel like he can tolerate it even take 300 mg 3 times a day.  I want to to try this and if it is not improving after a week or so give [place] a call and we can continue to titrate that medicine.  This type of pain should resolve over the next several weeks but it is not improving after several weeks then we can consider further imaging with MRI.  Please schedule follow-up appointment with Dr. Dareen Piano in 3 months.

## 2017-12-07 NOTE — Progress Notes (Signed)
Internal Medicine Clinic Attending  Case discussed with Dr. Charlynn Grimes at the time of the visit.  We reviewed the resident's history and exam and pertinent patient test results.  I agree with the assessment, diagnosis, and plan of care documented in the resident's note. I agree that MRI is not indicated - pt's worsening pain only for a few weeks, no red flags, and not failed medical tx yet.

## 2017-12-13 ENCOUNTER — Ambulatory Visit (INDEPENDENT_AMBULATORY_CARE_PROVIDER_SITE_OTHER): Payer: Medicare Other

## 2017-12-13 ENCOUNTER — Other Ambulatory Visit: Payer: Self-pay | Admitting: Internal Medicine

## 2017-12-13 DIAGNOSIS — M47816 Spondylosis without myelopathy or radiculopathy, lumbar region: Secondary | ICD-10-CM

## 2017-12-13 DIAGNOSIS — F200 Paranoid schizophrenia: Secondary | ICD-10-CM

## 2017-12-13 MED ORDER — GABAPENTIN 300 MG PO CAPS: 300.0000 mg | ORAL_CAPSULE | Freq: Three times a day (TID) | ORAL | 0 refills | Status: DC

## 2017-12-13 NOTE — Progress Notes (Signed)
Patient presented today with appropriate affect and good mood for his bi-weekly injection of Prolixin 37.5 mg. Patient reports no hallucinations or paranoia. Patient states that things are going well with his daughter and that he has a new pharmacy. Patient has my card and will call me with pharmacy information so that I can put it into his chart. The Prolixin 37.5 mg injection was prepared as ordered and administered in patients left deltoid. Patient tolerated well and will come back in 2 weeks for his next injection.

## 2017-12-13 NOTE — Telephone Encounter (Signed)
Called pharmacy back, they explained they do bubblepacking for the facility that pt resides in and the request should have read #90 because with his other previous fills he had enough, they will not fill this script until 6/27 for a start of dosing 7/4 so they are requesting #90, she explains they always need to be a few days early due to it being a facility but this time because of the upcoming holiday they are asking even earlier

## 2017-12-20 ENCOUNTER — Other Ambulatory Visit: Payer: Self-pay | Admitting: Internal Medicine

## 2017-12-20 DIAGNOSIS — M47816 Spondylosis without myelopathy or radiculopathy, lumbar region: Secondary | ICD-10-CM

## 2017-12-27 ENCOUNTER — Ambulatory Visit (INDEPENDENT_AMBULATORY_CARE_PROVIDER_SITE_OTHER): Payer: Medicare Other

## 2017-12-27 DIAGNOSIS — F200 Paranoid schizophrenia: Secondary | ICD-10-CM

## 2017-12-28 ENCOUNTER — Telehealth: Payer: Self-pay | Admitting: *Deleted

## 2017-12-28 DIAGNOSIS — M47816 Spondylosis without myelopathy or radiculopathy, lumbar region: Secondary | ICD-10-CM

## 2017-12-28 MED ORDER — GABAPENTIN 300 MG PO CAPS: 300.0000 mg | ORAL_CAPSULE | Freq: Three times a day (TID) | ORAL | 0 refills | Status: DC

## 2017-12-28 NOTE — Progress Notes (Signed)
Patient presented today with appropriate affect and good mood for his bi-weekly injection of Prolixin 37.5 mg. Patient reports no hallucinations or paranoia. Patient had a bit of trouble getting here today and is going to make sure his daughter knows ahead of time when he has an appointment. The Prolixin 37.5 mg injection was prepared as ordered and administered in patients right deltoid. Patient tolerated well and will come back in 2 weeks for his next injection.

## 2018-01-01 ENCOUNTER — Telehealth: Payer: Self-pay | Admitting: Internal Medicine

## 2018-01-01 NOTE — Telephone Encounter (Signed)
Patient Requesting a Referral be placed to [facility] as his  Left leg is not doing any better.  Offered patient an appointment and he does not won't to come in.  Patient states he used to go to [facility] and would need a new Referral to go back.  Please advise.

## 2018-01-02 NOTE — Telephone Encounter (Signed)
I know he does not want to come in but he needs to be evaluated before referring him to neurosurgery

## 2018-01-07 NOTE — Telephone Encounter (Signed)
PT [facility] FOR 01/09/2018 WITH THE [place] [facility].

## 2018-01-09 ENCOUNTER — Ambulatory Visit (INDEPENDENT_AMBULATORY_CARE_PROVIDER_SITE_OTHER): Payer: Medicare Other | Admitting: Internal Medicine

## 2018-01-09 ENCOUNTER — Encounter (INDEPENDENT_AMBULATORY_CARE_PROVIDER_SITE_OTHER): Payer: Self-pay

## 2018-01-09 ENCOUNTER — Encounter: Payer: Self-pay | Admitting: Internal Medicine

## 2018-01-09 ENCOUNTER — Other Ambulatory Visit: Payer: Self-pay

## 2018-01-09 VITALS — BP 137/71 | HR 86 | Temp 97.4°F | Ht 72.0 in | Wt 230.3 lb

## 2018-01-09 DIAGNOSIS — R0989 Other specified symptoms and signs involving the circulatory and respiratory systems: Secondary | ICD-10-CM | POA: Insufficient documentation

## 2018-01-09 DIAGNOSIS — F17210 Nicotine dependence, cigarettes, uncomplicated: Secondary | ICD-10-CM

## 2018-01-09 DIAGNOSIS — M5116 Intervertebral disc disorders with radiculopathy, lumbar region: Secondary | ICD-10-CM

## 2018-01-09 DIAGNOSIS — M545 Low back pain: Secondary | ICD-10-CM

## 2018-01-09 DIAGNOSIS — G8929 Other chronic pain: Secondary | ICD-10-CM | POA: Diagnosis not present

## 2018-01-09 DIAGNOSIS — Z79899 Other long term (current) drug therapy: Secondary | ICD-10-CM

## 2018-01-09 DIAGNOSIS — M5416 Radiculopathy, lumbar region: Secondary | ICD-10-CM

## 2018-01-09 NOTE — Progress Notes (Signed)
CC: lumbar radiculopathy  HPI:  [name] is a 62 y.o. male with PMH below.  He is here to address his continued lumbar pain from a moderate broad based disc bulge at L4-L5.   Please see A&P for status of the patient's chronic medical conditions  Past Medical History:  Diagnosis Date  . Diabetes mellitus without complication ([place])   . High risk sexual behavior 10/2007  . Hypertension   . Microcytosis   . Schizophrenia ([place])   . Substance abuse ([place])    tobacco use   Review of Systems:  ROS: Pulmonary: pt denies increased work of breathing, shortness of breath,  Cardiac: pt denies palpitations, chest pain,  Abdominal: pt denies abdominal pain, nausea, vomiting, or diarrhea  Physical Exam:  Vitals:   01/09/18 1405  BP: 137/71  Pulse: 86  Temp: (!) 97.4 F (36.3 C)  TempSrc: Oral  Weight: 230 lb 4.8 oz (104.5 kg)  Height: 6' (1.829 m)   Physical Exam  Eyes: Right eye exhibits no discharge. Left eye exhibits no discharge. No scleral icterus.  Cardiovascular: Normal rate, regular rhythm and normal heart sounds. Exam reveals no gallop and no friction rub.  No murmur heard. Pulses:      Dorsalis pedis pulses are 1+ on the right side, and 1+ on the left side.       Posterior tibial pulses are 2+ on the right side, and 2+ on the left side.  Pulmonary/Chest: Effort normal and breath sounds normal. No respiratory distress. He has no wheezes. He has no rales.  Musculoskeletal:  Today he is straight leg positive on the left and negative on the right.   He has normal strength and sensation in bilateral lower extremities. There is no point tenderness of the lumbar spine or paraspinal muscles.    Neurological: He is alert.  Skin: He is not diaphoretic.    Social History   Socioeconomic History  . Marital status: Legally Separated    Spouse name: Not on file  . Number of children: Not on file  . Years of education: Not on file  . Highest education level: Not on file   Occupational History  . Not on file  Social Needs  . Financial resource strain: Not on file  . Food insecurity:    Worry: Not on file    Inability: Not on file  . Transportation needs:    Medical: Not on file    Non-medical: Not on file  Tobacco Use  . Smoking status: Current Every Day Smoker    Packs/day: 0.30    Years: 38.00    Pack years: 11.40    Types: Cigarettes  . Smokeless tobacco: Never Used  . Tobacco comment: Reports has cut back but not ready to quit  Substance and Sexual Activity  . Alcohol use: No    Alcohol/week: 0.0 oz    Comment: Occasional use   . Drug use: No    Comment: h/o remote cocaine use-denies present use  . Sexual activity: Not Currently  Lifestyle  . Physical activity:    Days per week: Not on file    Minutes per session: Not on file  . Stress: Not on file  Relationships  . Social connections:    Talks on phone: Not on file    Gets together: Not on file    Attends religious service: Not on file    Active member of club or organization: Not on file    Attends meetings of  clubs or organizations: Not on file    Relationship status: Not on file  . Intimate partner violence:    Fear of current or ex partner: Not on file    Emotionally abused: Not on file    Physically abused: Not on file    Forced sexual activity: Not on file  Other Topics Concern  . Not on file  Social History Narrative   Patient given diabetes card 07/07/2010    Family History  Problem Relation Age of Onset  . Diabetes Mother   . Hypertension Mother   . Lupus Sister   . Sickle cell anemia Brother   . Drug abuse Brother     Assessment & Plan:   See Encounters Tab for problem based charting.  Patient discussed with Dr. Beryle Beams

## 2018-01-09 NOTE — Progress Notes (Signed)
Medicine attending: Medical history, presenting problems, physical findings, and medications, reviewed with resident physician Dr Guadlupe Spanish on the day of the patient visit and I concur with his evaluation and management plan. Previous right, now left, sciatic pain; no focal neuro deficits; no red flag signs. Known lumbar disc. Encouraged to get a F/U visit w Neurosurgery.

## 2018-01-09 NOTE — Patient Instructions (Signed)
Wayne Schmidt, the test determining how well blood flows to your lower legs and feet was normal today.  We will send you back to your neurosurgeon as your back pain continues to persist.  You will be getting a call about a referral in the next day or so.

## 2018-01-09 NOTE — Assessment & Plan Note (Signed)
While examining the patient for his lower back pain today I felt some decreased pulses in his Dorsalis pedis R foot> left.  He has normal PT bilaterally.  His feet and lower extremities are warm.    -given risk factors we were able to do ABI's in the clinic which returned normal and will be scanned in to the EMR.

## 2018-01-09 NOTE — Assessment & Plan Note (Addendum)
Pt reports continued lumbar pain from a moderate broad based disc bulge at L4-L5.  Has had lumbar radiculopathy progressing for about 3-4 years.  Tractor accident [phone] with cervical spinal laminectomy but lumbar spine was clear on imaging at that time but no MRI.  He has had lower back pain with radiculopathy first on the right side.  He was seen by sports medicine in 2016 when MRI of lumbar spine was ordered their plan was for neurosurgery referral but pt was lost to follow up.  He reports he was referred to Dr. Maryjean Ka about one year ago over at [facility] and was given injections which relieved his right sided radiculopathy.  Currently he is having left sided radiculopathy down to his foot.  He is doing okay with it today but it flares up and can be debilitating at times.  He currently takes 300mg  gabapentin TID which does help but due to his psych meds the combination makes him drowsy and he would likely not be able to tolerate a higher dose.  Today he is straight leg positive on the left.   He has normal strength and sensation in bilateral lower extremities.  He has had no weight loss, No incontinence of feces or urine, no urinary retention does have BPH that is stable on flomax.   -As the patient is established with [facility] we will refer him back for further management, as his last injection for symptoms on the right worked for one year it may be reasonable to give this a try on the left as well -continue gabapentin 300mg  TID, as pt is not in a current flare of his symptoms I will not add on any additional pain control medicine today

## 2018-01-10 ENCOUNTER — Ambulatory Visit (INDEPENDENT_AMBULATORY_CARE_PROVIDER_SITE_OTHER): Payer: Medicare Other

## 2018-01-10 DIAGNOSIS — F200 Paranoid schizophrenia: Secondary | ICD-10-CM

## 2018-01-10 NOTE — Progress Notes (Signed)
Patient presented today with appropriate affect and good mood for his bi-weekly injection of Prolixin 37.5 mg. Patient reports no hallucinations or paranoia. Patent doing well and had a good 4th of July. The Prolixin 37.5 mg injection was prepared as ordered and administered in patients left deltoid. Patient tolerated well and will come back in 2 weeks for his next injection.

## 2018-01-12 ENCOUNTER — Other Ambulatory Visit: Payer: Self-pay | Admitting: Internal Medicine

## 2018-01-12 DIAGNOSIS — E119 Type 2 diabetes mellitus without complications: Secondary | ICD-10-CM

## 2018-01-24 ENCOUNTER — Ambulatory Visit (INDEPENDENT_AMBULATORY_CARE_PROVIDER_SITE_OTHER): Payer: Medicare Other

## 2018-01-24 DIAGNOSIS — F200 Paranoid schizophrenia: Secondary | ICD-10-CM | POA: Diagnosis not present

## 2018-01-28 NOTE — Progress Notes (Signed)
Patient presented today with appropriate affect and good mood for his bi-weekly injection of Prolixin 37.5 mg. Patient reports no hallucinations or paranoia. Patent doing well no questions or complaints. The Prolixin 37.5 mg injection was prepared as ordered and administered in patients right deltoid. Patient tolerated well and will come back in 2 weeks for his next injection.

## 2018-02-04 ENCOUNTER — Ambulatory Visit (HOSPITAL_COMMUNITY): Payer: Medicare Other | Admitting: Psychiatry

## 2018-02-05 DIAGNOSIS — M5127 Other intervertebral disc displacement, lumbosacral region: Secondary | ICD-10-CM | POA: Diagnosis not present

## 2018-02-05 DIAGNOSIS — M5416 Radiculopathy, lumbar region: Secondary | ICD-10-CM | POA: Diagnosis not present

## 2018-02-05 DIAGNOSIS — I10 Essential (primary) hypertension: Secondary | ICD-10-CM | POA: Diagnosis not present

## 2018-02-07 ENCOUNTER — Ambulatory Visit (INDEPENDENT_AMBULATORY_CARE_PROVIDER_SITE_OTHER): Payer: Medicare Other

## 2018-02-07 DIAGNOSIS — F200 Paranoid schizophrenia: Secondary | ICD-10-CM | POA: Diagnosis not present

## 2018-02-07 NOTE — Progress Notes (Signed)
Patient presented today with appropriate affect and good mood for his bi-weekly injection of Prolixin 37.5 mg. Patient reports no hallucinations or paranoia.Patent doing well no questions or complaints.The Prolixin 37.5 mg injection was prepared as ordered and administered in patients left deltoid. Patient tolerated well and will come back in 2 weeks for his next injection.

## 2018-02-13 ENCOUNTER — Ambulatory Visit (INDEPENDENT_AMBULATORY_CARE_PROVIDER_SITE_OTHER): Payer: Medicare Other | Admitting: Psychiatry

## 2018-02-13 ENCOUNTER — Encounter (HOSPITAL_COMMUNITY): Payer: Self-pay | Admitting: Psychiatry

## 2018-02-13 ENCOUNTER — Other Ambulatory Visit (HOSPITAL_COMMUNITY): Payer: Self-pay | Admitting: Psychiatry

## 2018-02-13 VITALS — BP 108/72 | HR 84 | Ht 72.0 in | Wt 225.0 lb

## 2018-02-13 DIAGNOSIS — G2119 Other drug induced secondary parkinsonism: Secondary | ICD-10-CM

## 2018-02-13 DIAGNOSIS — F17210 Nicotine dependence, cigarettes, uncomplicated: Secondary | ICD-10-CM

## 2018-02-13 DIAGNOSIS — Z811 Family history of alcohol abuse and dependence: Secondary | ICD-10-CM | POA: Diagnosis not present

## 2018-02-13 DIAGNOSIS — F200 Paranoid schizophrenia: Secondary | ICD-10-CM | POA: Diagnosis not present

## 2018-02-13 DIAGNOSIS — G2401 Drug induced subacute dyskinesia: Secondary | ICD-10-CM

## 2018-02-13 NOTE — Progress Notes (Signed)
[facility] MD/PA/NP OP Progress Note  02/13/2018 11:54 AM Elior Robinette  MRN:  [id number]  Chief Complaint: side effects HPI: Wayne Schmidt presents for medication management and cross coverage care.  He continues to present with symptoms of tardive dyskinesia.  He reports that his mood and thinking is the same, and denies any SI, HI, AVH.   Reviewing the record, it appears he has been on the 37.5 mg dose of Prolixin for approximately 5 years.  He has complaints about how this is affected his body and movements and feels like it is affecting his quality of life.  He was agreeable to reduce the dose to 25 mg.  Spent time discussing that given he has aged over the past 5 years, a lower dose of Prolixin may be effective, and we can try the lower dose for a couple months to see if it is able to handle his symptoms and needs.  He was agreeable to this and if needed increasing back to the 37.5.  Visit Diagnosis:    ICD-10-CM   1. Tardive dyskinesia G24.01   2. Paranoid schizophrenia, subchronic condition ([place]) F20.0   3. 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 Reviewed, with no updates at this time.   Past Medical History:  Past Medical History:  Diagnosis Date  . Diabetes mellitus without complication ([place])   . High risk sexual behavior 10/2007  . Hypertension   . Microcytosis   . Schizophrenia ([place])   . Substance abuse ([place])    tobacco use    Past Surgical History:  Procedure Laterality Date  . LAMINECTOMY     by Dr Rolin Barry    Family Psychiatric History: See intake H&P for full details. Reviewed, with no updates at this time.   Family History:  Family History  Problem Relation Age of Onset  . Diabetes Mother   . Hypertension Mother   . Lupus Sister   . Sickle cell anemia Brother   . Drug abuse Brother     Social History:  Social History   Socioeconomic History  . Marital status: Legally Separated     Spouse name: Not on file  . Number of children: Not on file  . Years of education: Not on file  . Highest education level: Not on file  Occupational History  . Not on file  Social Needs  . Financial resource strain: Not on file  . Food insecurity:    Worry: Not on file    Inability: Not on file  . Transportation needs:    Medical: Not on file    Non-medical: Not on file  Tobacco Use  . Smoking status: Current Every Day Smoker    Packs/day: 0.50    Years: 38.00    Pack years: 19.00    Types: Cigarettes  . Smokeless tobacco: Never Used  . Tobacco comment: Reports has cut back but not ready to quit  Substance and Sexual Activity  . Alcohol use: No    Alcohol/week: 0.0 standard drinks    Comment: Occasional 20 oz beer every 2-3 weeks   . Drug use: No    Comment: h/o remote cocaine use-denies present use  . Sexual activity: Not Currently  Lifestyle  . Physical activity:    Days per week: Not on file    Minutes per session: Not on file  . Stress: Not on file  Relationships  . Social connections:    Talks  on phone: Not on file    Gets together: Not on file    Attends religious service: Not on file    Active member of club or organization: Not on file    Attends meetings of clubs or organizations: Not on file    Relationship status: Not on file  Other Topics Concern  . Not on file  Social History Narrative   Patient given diabetes card 07/07/2010    Allergies:  Allergies  Allergen Reactions  . Ace Inhibitors Cough  . Atorvastatin Other (See Comments)    Dizzy / Faint  . Chlorpromazine Hcl Other (See Comments)    Passed out   . Simvastatin Other (See Comments)    Dizzy / Faint    Metabolic Disorder Labs: Lab Results  Component Value Date   HGBA1C 6.7 (A) 11/27/2017   MPG 163 08/24/2016   No results found for: PROLACTIN Lab Results  Component Value Date   CHOL 172 11/27/2017   TRIG 60 11/27/2017   HDL 64 11/27/2017   CHOLHDL 2.7 11/27/2017   VLDL 11  04/01/2014   LDLCALC 96 11/27/2017   LDLCALC 85 12/12/2016   Lab Results  Component Value Date   TSH 1.470 12/12/2016   TSH 1.647 10/29/2008    Therapeutic Level Labs: No results found for: LITHIUM No results found for: VALPROATE No components found for:  CBMZ  Current Medications: Current Outpatient Medications  Medication Sig Dispense Refill  . doxazosin (CARDURA) 4 MG tablet TAKE 1 TABLET BY MOUTH EVERY DAY 90 tablet 3  . fluPHENAZine decanoate (PROLIXIN) 25 MG/ML injection Inject 1.5 mLs (37.5 mg total) into the muscle every 14 (fourteen) days. 5 mL 2  . gabapentin (NEURONTIN) 300 MG capsule Take 1 capsule (300 mg total) by mouth 3 (three) times daily. 270 capsule 0  . hydroxypropyl methylcellulose / hypromellose (ISOPTO TEARS / GONIOVISC) 2.5 % ophthalmic solution Place 2 drops into both eyes daily as needed for dry eyes.    Marland Kitchen JARDIANCE 10 MG TABS tablet TAKE 1 TABLET BY MOUTH EVERY DAY 90 tablet 1  . losartan (COZAAR) 100 MG tablet TAKE 1 TABLET BY MOUTH EVERY DAY 90 tablet 3  . pravastatin (PRAVACHOL) 40 MG tablet TAKE 1 TABLET (40 MG TOTAL) BY MOUTH DAILY. 90 tablet 3  . tamsulosin (FLOMAX) 0.4 MG CAPS capsule Take 0.4 mg by mouth daily.     Current Facility-Administered Medications  Medication Dose Route Frequency Provider Last Rate Last Dose  . fluPHENAZine decanoate (PROLIXIN) injection 37.5 mg  37.5 mg Intramuscular Q14 Days Arfeen, Arlyce Harman, MD   37.5 mg at 02/07/18 1121     Musculoskeletal: Strength & Muscle Tone: abnormal Gait & Station: shuffle Patient leans: N/A  Psychiatric Specialty Exam: ROS  Blood pressure 108/72, pulse 84, height 6' (1.829 m), weight 225 lb (102.1 kg), SpO2 92 %.Body mass index is 30.52 kg/m.  General Appearance: Casual and Fairly Groomed  Eye Contact:  Fair  Speech:  Garbled  Volume:  Normal  Mood:  Euthymic  Affect:  Congruent  Thought Process:  Goal Directed and Descriptions of Associations: Intact  Orientation:  Full (Time,  Place, and Person)  Thought Content: Logical and Rumination   Suicidal Thoughts:  No  Homicidal Thoughts:  No  Memory:  Immediate;   Good  Judgement:  Fair  Insight:  Fair  Psychomotor Activity:  EPS, Psychomotor Retardation and TD  Concentration:  Attention Span: Fair  Recall:  [employer] of Knowledge: Fair  Language: Fair  Akathisia:  Negative  Handed:  Left  AIMS (if indicated): done  Assets:  Communication Skills Desire for Improvement Housing Social Support Transportation  ADL's:  Intact  Cognition: WNL  Sleep:  Good   Screenings: PHQ2-9     Office Visit from 01/09/2018 in [facility] Office Visit from 12/04/2017 in [facility] Office Visit from 11/27/2017 in [facility] Office Visit from 12/12/2016 in [facility] Office Visit from 09/05/2016 in [facility]  PHQ-2 Total Score  0  0  0  0  0       Assessment and Plan:  Amato Sevillano is a 62 year old man with paranoid schizophrenia on long-acting injection of Prolixin.  He presents with symptoms of parkinsonism and tardive dyskinesia, and as he has aged over the past 4-5 years, I suspect he will be able to maintain a lower overall dose of Prolixin as he is likely biologically more sensitive to neuroleptics.  He is quite apprehensive about any other medication changes, and I educated him on tardive dyskinesia.  He does not present with any acute safety issues or psychotic symptoms.  He is fairly ruminative about his worries that the Prolixin dose is too high for him.  We spent time discussing the risks and benefits of reducing the Prolixin and ultimately agreed to reduce to 25 mg injection every 2 weeks to see if he is able to afford ongoing stability on the lower dose injection.  He is due for his next injection in approximately 1 week.  He has no other acute concerns and we will follow-up in 8-10 weeks or sooner if  needed.  1. Tardive dyskinesia   2. Paranoid schizophrenia, subchronic condition ([place])   3. 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   Labs Reviewed: NA  Collateral Obtained/Records Reviewed: Dr. Adele Schilder prior notes  Plan:  Decrease Prolixin injection to 25 mg every 2 weeks given now geriatric status and symptoms of parkinsonism and tardive dyskinesia Follow-up with Dr. Adele Schilder in 8-10 weeks Educated patient about tardive dyskinesia, may continue to offer treatment  Aundra Dubin, MD 02/13/2018, 11:54 AM

## 2018-02-14 DIAGNOSIS — M5416 Radiculopathy, lumbar region: Secondary | ICD-10-CM | POA: Diagnosis not present

## 2018-02-21 ENCOUNTER — Other Ambulatory Visit (HOSPITAL_COMMUNITY): Payer: Self-pay

## 2018-02-21 ENCOUNTER — Ambulatory Visit (INDEPENDENT_AMBULATORY_CARE_PROVIDER_SITE_OTHER): Payer: Medicare Other

## 2018-02-21 DIAGNOSIS — F200 Paranoid schizophrenia: Secondary | ICD-10-CM

## 2018-02-21 MED ORDER — FLUPHENAZINE DECANOATE 25 MG/ML IJ SOLN
25.0000 mg | INTRAMUSCULAR | Status: AC
  Administered 2018-02-21 – 2019-01-09 (×24): 25 mg via INTRAMUSCULAR

## 2018-02-21 MED ORDER — HYDROXYZINE HCL 10 MG PO TABS: 10.0000 mg | ORAL_TABLET | Freq: Every day | ORAL | 0 refills | Status: DC

## 2018-02-21 MED ORDER — FLUPHENAZINE DECANOATE 25 MG/ML IJ SOLN: 25.0000 mg | INTRAMUSCULAR | 2 refills | Status: DC

## 2018-02-21 NOTE — Progress Notes (Signed)
Patient presents today with normal affect and good mood for his bi-weekly injection of 25 mg Prolixin. Patient reports no suicidal ideations and no hallucinations. Patient is very happy that his dose has been lowered and he hopes that it helps the aches and pains. Prolixin 25 mg injection was prepared as ordered and administered in patients right deltoid. Patient tolerated well and will return in two weeks. Patient will call if there are any issues

## 2018-02-26 ENCOUNTER — Ambulatory Visit (INDEPENDENT_AMBULATORY_CARE_PROVIDER_SITE_OTHER): Payer: Medicare Other | Admitting: Internal Medicine

## 2018-02-26 ENCOUNTER — Encounter: Payer: Self-pay | Admitting: Internal Medicine

## 2018-02-26 ENCOUNTER — Other Ambulatory Visit: Payer: Self-pay

## 2018-02-26 VITALS — BP 120/69 | HR 90 | Temp 98.6°F | Wt 221.3 lb

## 2018-02-26 DIAGNOSIS — F419 Anxiety disorder, unspecified: Secondary | ICD-10-CM | POA: Diagnosis not present

## 2018-02-26 DIAGNOSIS — M5416 Radiculopathy, lumbar region: Secondary | ICD-10-CM

## 2018-02-26 DIAGNOSIS — M545 Low back pain: Secondary | ICD-10-CM | POA: Diagnosis not present

## 2018-02-26 DIAGNOSIS — E1140 Type 2 diabetes mellitus with diabetic neuropathy, unspecified: Secondary | ICD-10-CM

## 2018-02-26 DIAGNOSIS — Z791 Long term (current) use of non-steroidal anti-inflammatories (NSAID): Secondary | ICD-10-CM

## 2018-02-26 DIAGNOSIS — Z23 Encounter for immunization: Secondary | ICD-10-CM

## 2018-02-26 DIAGNOSIS — Z7984 Long term (current) use of oral hypoglycemic drugs: Secondary | ICD-10-CM

## 2018-02-26 DIAGNOSIS — E119 Type 2 diabetes mellitus without complications: Secondary | ICD-10-CM | POA: Diagnosis not present

## 2018-02-26 DIAGNOSIS — M5116 Intervertebral disc disorders with radiculopathy, lumbar region: Secondary | ICD-10-CM | POA: Diagnosis not present

## 2018-02-26 DIAGNOSIS — Z79899 Other long term (current) drug therapy: Secondary | ICD-10-CM

## 2018-02-26 DIAGNOSIS — Z0000 Encounter for general adult medical examination without abnormal findings: Secondary | ICD-10-CM

## 2018-02-26 LAB — POCT GLYCOSYLATED HEMOGLOBIN (HGB A1C): Hemoglobin A1C: 7.1 % — AB (ref 4.0–5.6)

## 2018-02-26 LAB — GLUCOSE, CAPILLARY: Glucose-Capillary: 108 mg/dL — ABNORMAL HIGH (ref 70–99)

## 2018-02-26 NOTE — Assessment & Plan Note (Signed)
The patient states that he has been having continued lower back pain that is radiating down left leg. He describes his pain as worse with sitting down and with his leg bent. He has relief with gabapentin.   The patient had lumbar spine x-ray done in 2019 which showed mild to moderate L4-L5 degenerative changes.  His MRI lumbar spine in March 2016 showed 4 to L5 moderate disc bulge at right foraminal region without any foraminal stenosis.  Has been using ibuprofen 4 tablets of 200mg  for pain relief which helps somewhat.   Assessment and plan  Patient has signs and symptoms consistent with lumbar radiculopathy.  The patient has neuropathic pain control with gabapentin 300 mg 3 times daily.  He is also occasionally been using ibuprofen for pain control. He is scheduled for a follow up appointment with neurosurgery on September 16th 2019

## 2018-02-26 NOTE — Progress Notes (Signed)
   CC: Diabetes Mellitus Follow up  HPI:  [name] is a 62 y.o. with past medical history documented below presents for follow up regarding diabetes mellitus. Please see problem based charting for evaluation, assessment, and plan.  Past Medical History:  Diagnosis Date  . Diabetes mellitus without complication ([place])   . High risk sexual behavior 10/2007  . Hypertension   . Microcytosis   . Schizophrenia ([place])   . Substance abuse ([place])    tobacco use   Review of Systems:    Has lower back pain, numbness and tingling,  Denies headaches, sob, chest pain  Physical Exam:  Vitals:   02/26/18 1410  BP: 120/69  Pulse: 90  Temp: 98.6 F (37 C)  TempSrc: Oral  SpO2: 98%  Weight: 221 lb 4.8 oz (100.4 kg)   Physical Exam  Constitutional: He appears well-developed and well-nourished. No distress.  Patient had to spend a large part of the encounter standing up as he felt uncomfortable in seated position  Eyes: Conjunctivae are normal.  Cardiovascular: Normal rate, regular rhythm and normal heart sounds.  Respiratory: Effort normal and breath sounds normal. No respiratory distress. He has no wheezes.  GI: Soft. Bowel sounds are normal. He exhibits no distension. There is no tenderness.  Musculoskeletal: He exhibits no edema.  Normal gait, no limp or atalgic gait noted.  Neurological: He is alert.  Skin: He is not diaphoretic. No erythema.  Psychiatric: His behavior is normal. His mood appears anxious. His affect is not blunt. His speech is not rapid and/or pressured, not delayed and not slurred. Cognition and memory are not impaired. He does not express impulsivity.  Patient was with eye squinted/closed, did not maintain eye contact with provider, he became very anxious about his slight increase in A1C   Assessment & Plan:   See Encounters Tab for problem based charting.  Patient discussed with Dr. Dareen Piano

## 2018-02-26 NOTE — Patient Instructions (Signed)
It was a pleasure to see you today [name]. Continue your good work on your diabetes control.   -Continue to exercise minutes daily and your intake of sugary, carbohydrate rich foods -Follow-up with neurosurgery on September 16  If you have any questions or concerns, please call our clinic at [phone] between 9am-5pm and after hours call [phone] and ask for the internal medicine resident on call. If you feel you are having a medical emergency please call 911.   Thank you, we look forward to help you remain healthy!  Lars Mage, MD Internal Medicine PGY2

## 2018-02-26 NOTE — Assessment & Plan Note (Signed)
The patient's last A1c was 6.7 in May 2019 and during this visit it was 7.1.  He is currently taking Jardiance 10 mg daily.  Since that he has been compliant with medication.  He has had a 9lbs since June 2019.  For long-term management the patient is on an ARB (losartan 100 mg daily) and he is on pravastatin 40 mg daily.  Patient is going to be seen by ophthalmology in September 2019.  Assessment and plan Patient continues to do well on Jardiance 10 mg daily.  He is currently at goal of A1c less than or equal to 7.  He states that he has been having some drowsiness and dizziness she feels might be due to his low blood glucose levels.  Therefore, I requested the patient to record blood glucose readings and bring it into his next follow-up visit.  -Continue Jardiance 10 mg daily -Follow-up in 3 months -Patient on lifestyle modifications regarding diet and exercise

## 2018-02-26 NOTE — Assessment & Plan Note (Signed)
Flu shot was given

## 2018-02-28 NOTE — Progress Notes (Signed)
Internal Medicine Clinic Attending  I saw and evaluated the patient.  I personally confirmed the key portions of the history and exam documented by Dr. Chundi and I reviewed pertinent patient test results.  The assessment, diagnosis, and plan were formulated together and I agree with the documentation in the resident's note. 

## 2018-03-06 DIAGNOSIS — H524 Presbyopia: Secondary | ICD-10-CM | POA: Diagnosis not present

## 2018-03-06 DIAGNOSIS — H2513 Age-related nuclear cataract, bilateral: Secondary | ICD-10-CM | POA: Diagnosis not present

## 2018-03-06 DIAGNOSIS — H25013 Cortical age-related cataract, bilateral: Secondary | ICD-10-CM | POA: Diagnosis not present

## 2018-03-06 LAB — HM DIABETES EYE EXAM

## 2018-03-07 ENCOUNTER — Ambulatory Visit (INDEPENDENT_AMBULATORY_CARE_PROVIDER_SITE_OTHER): Payer: Medicare Other

## 2018-03-07 DIAGNOSIS — F200 Paranoid schizophrenia: Secondary | ICD-10-CM

## 2018-03-07 NOTE — Progress Notes (Signed)
Patient presents today with normal affect and good mood for his bi-weekly injection of 25 mg Prolixin. Patient reports no suicidal ideations and no hallucinations. Patient is very happy with his lower dose, he states that pain seems to be a little better. Prolixin 25 mg injection was prepared as ordered and administered in patients right deltoid. Patient tolerated well and will return in two weeks. Patient will call if there are any issues

## 2018-03-12 ENCOUNTER — Encounter: Payer: Self-pay | Admitting: Internal Medicine

## 2018-03-12 ENCOUNTER — Encounter: Payer: Self-pay | Admitting: *Deleted

## 2018-03-18 DIAGNOSIS — M549 Dorsalgia, unspecified: Secondary | ICD-10-CM | POA: Diagnosis not present

## 2018-03-18 DIAGNOSIS — M5416 Radiculopathy, lumbar region: Secondary | ICD-10-CM | POA: Diagnosis not present

## 2018-03-21 ENCOUNTER — Ambulatory Visit (INDEPENDENT_AMBULATORY_CARE_PROVIDER_SITE_OTHER): Payer: Medicare Other

## 2018-03-21 DIAGNOSIS — F200 Paranoid schizophrenia: Secondary | ICD-10-CM | POA: Diagnosis not present

## 2018-03-21 NOTE — Progress Notes (Signed)
Patient presents today with normal affect and good mood for his bi-weekly injection of 25 mg Prolixin. Patient reports no suicidal ideations and no hallucinations. Patient is very happy with his lower dose, he states that pain seems to be a little better. Prolixin 25 mg injection was prepared as ordered and administered in patients right deltoid. Patient did not wan the left side done, he said that his arm is sore. Patient tolerated well and will return in two weeks. Patient will call if there are any issues

## 2018-04-04 ENCOUNTER — Ambulatory Visit (INDEPENDENT_AMBULATORY_CARE_PROVIDER_SITE_OTHER): Payer: Medicare Other

## 2018-04-04 ENCOUNTER — Other Ambulatory Visit: Payer: Self-pay | Admitting: Neurosurgery

## 2018-04-04 DIAGNOSIS — F200 Paranoid schizophrenia: Secondary | ICD-10-CM

## 2018-04-04 DIAGNOSIS — M5416 Radiculopathy, lumbar region: Secondary | ICD-10-CM

## 2018-04-04 NOTE — Progress Notes (Signed)
Patient arrived today with appropriate affect and mood for is biweekly shot of Prolixin 25 mg. Patient states he is doing well, no side effects, no V/A hallucinations.Injection of Prolixin 25 mg was prepared as ordered and administered in patientsleftdeltoid. Patient tolerated well and without complaint. Patient will return in two weeks for his next injection.      

## 2018-04-08 ENCOUNTER — Other Ambulatory Visit: Payer: Self-pay | Admitting: Internal Medicine

## 2018-04-08 DIAGNOSIS — M47816 Spondylosis without myelopathy or radiculopathy, lumbar region: Secondary | ICD-10-CM

## 2018-04-09 ENCOUNTER — Other Ambulatory Visit: Payer: Self-pay

## 2018-04-09 NOTE — Patient Outreach (Signed)
[facility]) Care Management  04/09/2018  Wayne Schmidt [birth year] [id number]   Medication Adherence call to [name] patient did not answer patient is due on Jardiance  10 mg under [place].   [facility] Direct Dial [phone]  Fax [phone] [email or web address]

## 2018-04-16 ENCOUNTER — Other Ambulatory Visit: Payer: Self-pay | Admitting: Neurosurgery

## 2018-04-16 ENCOUNTER — Ambulatory Visit
Admission: RE | Admit: 2018-04-16 | Discharge: 2018-04-16 | Disposition: A | Discharge status: Home / Self Care | Payer: Medicare Other | Source: Ambulatory Visit | Attending: Neurosurgery | Admitting: Neurosurgery

## 2018-04-16 DIAGNOSIS — M48061 Spinal stenosis, lumbar region without neurogenic claudication: Secondary | ICD-10-CM | POA: Diagnosis not present

## 2018-04-16 DIAGNOSIS — S40852A Superficial foreign body of left upper arm, initial encounter: Secondary | ICD-10-CM

## 2018-04-16 DIAGNOSIS — Z95818 Presence of other cardiac implants and grafts: Secondary | ICD-10-CM | POA: Diagnosis not present

## 2018-04-16 DIAGNOSIS — M5416 Radiculopathy, lumbar region: Secondary | ICD-10-CM

## 2018-04-18 ENCOUNTER — Ambulatory Visit (INDEPENDENT_AMBULATORY_CARE_PROVIDER_SITE_OTHER): Payer: Medicare Other

## 2018-04-18 DIAGNOSIS — F200 Paranoid schizophrenia: Secondary | ICD-10-CM

## 2018-04-18 NOTE — Progress Notes (Signed)
Patient arrived today with appropriate affect and mood for is biweekly shot of Prolixin 25 mg. Patient states he is doing well, no side effects, no V/A hallucinations.Patient is happy because since lowering his dose, he no longer has the leg pain that has been bothering him for so long.Injection of Prolixin 25 mg was prepared as ordered and administered in patients rightdeltoid. Patient tolerated well and without complaint. Patient will return in two weeks for his next injection 

## 2018-04-24 DIAGNOSIS — M549 Dorsalgia, unspecified: Secondary | ICD-10-CM | POA: Diagnosis not present

## 2018-04-24 DIAGNOSIS — M5416 Radiculopathy, lumbar region: Secondary | ICD-10-CM | POA: Diagnosis not present

## 2018-04-24 DIAGNOSIS — I10 Essential (primary) hypertension: Secondary | ICD-10-CM | POA: Diagnosis not present

## 2018-05-02 ENCOUNTER — Ambulatory Visit (INDEPENDENT_AMBULATORY_CARE_PROVIDER_SITE_OTHER): Payer: Medicare Other

## 2018-05-02 DIAGNOSIS — F200 Paranoid schizophrenia: Secondary | ICD-10-CM

## 2018-05-02 NOTE — Progress Notes (Signed)
Patient arrived today with appropriate affect and mood for is biweekly shot of Prolixin 25 mg. Patient states he is doing well, no side effects, no V/A hallucinations.Patient is happy because since lowering his dose, he no longer has the leg pain that has been bothering him for so long.Injection of Prolixin 25 mg was prepared as ordered and administered in patientsleftdeltoid. Patient tolerated well and without complaint. Patient will return in two weeks for his next injection 

## 2018-05-08 ENCOUNTER — Other Ambulatory Visit: Payer: Self-pay | Admitting: Internal Medicine

## 2018-05-08 DIAGNOSIS — I10 Essential (primary) hypertension: Secondary | ICD-10-CM

## 2018-05-09 NOTE — Telephone Encounter (Signed)
Next appt scheduled 07/09/18 with PCP.

## 2018-05-13 ENCOUNTER — Other Ambulatory Visit (HOSPITAL_COMMUNITY): Payer: Self-pay | Admitting: Psychiatry

## 2018-05-13 DIAGNOSIS — F200 Paranoid schizophrenia: Secondary | ICD-10-CM

## 2018-05-15 ENCOUNTER — Ambulatory Visit (INDEPENDENT_AMBULATORY_CARE_PROVIDER_SITE_OTHER): Payer: Medicare Other | Admitting: Psychiatry

## 2018-05-15 ENCOUNTER — Encounter (HOSPITAL_COMMUNITY): Payer: Self-pay

## 2018-05-15 ENCOUNTER — Ambulatory Visit (INDEPENDENT_AMBULATORY_CARE_PROVIDER_SITE_OTHER): Payer: Medicare Other

## 2018-05-15 VITALS — BP 130/78 | Ht 72.5 in | Wt 222.0 lb

## 2018-05-15 DIAGNOSIS — F200 Paranoid schizophrenia: Secondary | ICD-10-CM

## 2018-05-15 DIAGNOSIS — G2401 Drug induced subacute dyskinesia: Secondary | ICD-10-CM | POA: Diagnosis not present

## 2018-05-15 MED ORDER — FLUPHENAZINE DECANOATE 25 MG/ML IJ SOLN: 25.0000 mg | INTRAMUSCULAR | 2 refills | Status: DC

## 2018-05-15 NOTE — Progress Notes (Signed)
BH MD/PA/NP OP Progress Note  05/15/2018 10:22 AM Wayne Schmidt  MRN:  [id number]  Chief Complaint: I am doing fine.  I stopped taking hydroxyzine because it is causing the side effects.  HPI: Wayne Schmidt came for his follow-up appointment.  He was last seen by Dr. Daron Offer in my absence and his Prolixin dose was reduced due to his tremors and shakes.  He is now on Prolixin 25 mg intramuscular every 4 weeks.  Patient do not see any decompensation but noticed that he gets some time irritable easily.  He wants to give a try current dose for now however he agreed that if irritability continue to get worse then he may go back to 37.5 mg.  We had reduce the dose in the past and he started to decompensate but this time he like to give another chance to reduce dose.  He is not interested to take anything for his ADD.  We talked about Ingreeza to help TD but he refused to take any new medication.  He actually stopped taking Vistaril which we have prescribed a few months ago claiming side effects but did not provide the details.  He is sleeping good.  He denies any hallucination.  He tends to be guarded and minimizes symptoms but is not aggressive, agitated or having any paranoia or delusion recently.  He is now living with his daughter because he cannot take care of himself.  He tends to get easily forgetful but does not want to get any help.  He is not drinking or using any illegal substances.  His energy level is fair.  He wants to continue the current dose of Prolixin.  Visit Diagnosis:    ICD-10-CM   1. Paranoid schizophrenia, subchronic condition ([place]) F20.0   2. Tardive dyskinesia G24.01     Past Psychiatric History: Reviewed. Patient has a long history of psychiatric illness with multiple hospitalization. He recall at least 16-20 times hospitalized. He was admitted at [facility] for psychosis hallucination and paranoia. In the past he had tried Haldol and Thorazine but endorse that he was allergic to  these medication. He is taking Prolixin injection for past 30 years. He has at least 2 psychiatric admission because of suicidal attempt when he took overdose on his medication.  Past Medical History:  Past Medical History:  Diagnosis Date  . Diabetes mellitus without complication ([place])   . High risk sexual behavior 10/2007  . Hypertension   . Microcytosis   . Schizophrenia ([place])   . Substance abuse ([place])    tobacco use    Past Surgical History:  Procedure Laterality Date  . LAMINECTOMY     by Dr Rolin Barry    Family Psychiatric History: Viewed.  Family History:  Family History  Problem Relation Age of Onset  . Diabetes Mother   . Hypertension Mother   . Lupus Sister   . Sickle cell anemia Brother   . Drug abuse Brother     Social History:  Social History   Socioeconomic History  . Marital status: Legally Separated    Spouse name: Not on file  . Number of children: Not on file  . Years of education: Not on file  . Highest education level: Not on file  Occupational History  . Not on file  Social Needs  . Financial resource strain: Not on file  . Food insecurity:    Worry: Not on file    Inability: Not on file  . Transportation needs:  Medical: Not on file    Non-medical: Not on file  Tobacco Use  . Smoking status: Current Every Day Smoker    Packs/day: 0.75    Years: 38.00    Pack years: 28.50    Types: Cigarettes  . Smokeless tobacco: Never Used  Substance and Sexual Activity  . Alcohol use: No    Alcohol/week: 0.0 standard drinks    Comment: Occasional 20 oz beer every 2-3 weeks   . Drug use: No    Comment: h/o remote cocaine use-denies present use  . Sexual activity: Not Currently  Lifestyle  . Physical activity:    Days per week: Not on file    Minutes per session: Not on file  . Stress: Not on file  Relationships  . Social connections:    Talks on phone: Not on file    Gets together: Not on file    Attends religious service: Not on file     Active member of club or organization: Not on file    Attends meetings of clubs or organizations: Not on file    Relationship status: Not on file  Other Topics Concern  . Not on file  Social History Narrative   Patient given diabetes card 07/07/2010    Allergies:  Allergies  Allergen Reactions  . Ace Inhibitors Cough  . Atorvastatin Other (See Comments)    Dizzy / Faint  . Chlorpromazine Hcl Other (See Comments)    Passed out   . Simvastatin Other (See Comments)    Dizzy / Faint    Metabolic Disorder Labs: Lab Results  Component Value Date   HGBA1C 7.1 (A) 02/26/2018   MPG 163 08/24/2016   No results found for: PROLACTIN Lab Results  Component Value Date   CHOL 172 11/27/2017   TRIG 60 11/27/2017   HDL 64 11/27/2017   CHOLHDL 2.7 11/27/2017   VLDL 11 04/01/2014   LDLCALC 96 11/27/2017   LDLCALC 85 12/12/2016   Lab Results  Component Value Date   TSH 1.470 12/12/2016   TSH 1.647 10/29/2008    Therapeutic Level Labs: No results found for: LITHIUM No results found for: VALPROATE No components found for:  CBMZ  Current Medications: Current Outpatient Medications  Medication Sig Dispense Refill  . doxazosin (CARDURA) 4 MG tablet TAKE (1) TABLET BY MOUTH ONCE DAILY. 30 tablet 11  . fluPHENAZine decanoate (PROLIXIN) 25 MG/ML injection Inject 1 mL (25 mg total) into the muscle every 14 (fourteen) days. 5 mL 2  . gabapentin (NEURONTIN) 300 MG capsule TAKE 1 CAPSULE BY MOUTH THREE TIMES DAILY. 90 capsule 11  . hydroxypropyl methylcellulose / hypromellose (ISOPTO TEARS / GONIOVISC) 2.5 % ophthalmic solution Place 2 drops into both eyes daily as needed for dry eyes.    . hydrOXYzine (ATARAX/VISTARIL) 10 MG tablet TAKE 1 TABLET BY MOUTH AT BEDTIME. (Patient not taking: Reported on 05/15/2018) 30 tablet 11  . hydrOXYzine (ATARAX/VISTARIL) 10 MG tablet Take 1 tablet (10 mg total) by mouth daily. 90 tablet 0  . JARDIANCE 10 MG TABS tablet TAKE 1 TABLET BY MOUTH EVERY DAY 90  tablet 1  . losartan (COZAAR) 100 MG tablet TAKE (1) TABLET BY MOUTH ONCE DAILY. 30 tablet 11  . pravastatin (PRAVACHOL) 40 MG tablet TAKE 1 TABLET (40 MG TOTAL) BY MOUTH DAILY. 90 tablet 3  . tamsulosin (FLOMAX) 0.4 MG CAPS capsule Take 0.4 mg by mouth daily.     Current Facility-Administered Medications  Medication Dose Route Frequency Provider Last Rate Last  Dose  . fluPHENAZine decanoate (PROLIXIN) injection 25 mg  25 mg Intramuscular Q14 Days Kambryn Dapolito, Arlyce Harman, MD   25 mg at 05/15/18 1019     Musculoskeletal: Strength & Muscle Tone: within normal limits Gait & Station: normal Patient leans: N/A  Psychiatric Specialty Exam: ROS  Blood pressure 130/78, height 6' 0.5" (1.842 m), weight 222 lb (100.7 kg).Body mass index is 29.69 kg/m.  General Appearance: Casual and Guarded and minimizes his symptoms.  Eye Contact:  Fair  Speech:  Slow  Volume:  Decreased  Mood:  Euthymic  Affect:  Restricted  Thought Process:  Descriptions of Associations: Intact  Orientation:  Full (Time, Place, and Person)  Thought Content: Rumination   Suicidal Thoughts:  No  Homicidal Thoughts:  No  Memory:  Immediate;   Fair Recent;   Fair Remote;   Fair  Judgement:  Fair  Insight:  Present  Psychomotor Activity:  Decreased and Tremor  Concentration:  Concentration: Fair and Attention Span: Fair  Recall:  [employer] of Knowledge: Fair  Language: Fair  Akathisia:  No  Handed:  Right  AIMS (if indicated): not done  Assets:  Desire for Improvement Housing Social Support  ADL's:  Intact  Cognition: WNL  Sleep:  Fair   Screenings: PHQ2-9     Office Visit from 02/26/2018 in [facility] Office Visit from 01/09/2018 in [facility] Office Visit from 12/04/2017 in [facility] Office Visit from 11/27/2017 in [facility] Office Visit from 12/12/2016 in [facility]  PHQ-2 Total Score  0  0  0   0  0       Assessment and Plan: Schizophrenia chronic paranoid type.  Tardive dyskinesia.  Patient like to continue the current dose of Prolixin 25 mg intramuscular.  He noticed some irritability but he wants to give more time to the current dose of Prolixin.  I explained that we need to watch carefully if symptoms started to get worse or having any paranoia and irritability get worse then we may need to go back on Prolixin 37.5 mg.  I will discontinue hydroxyzine since he is not taking it.  We also talked about Ingreeza or TD but he is not interested to add any more medication.  Recommended to call us back if he has any question, concern or if he feels worsening of the symptoms.  Follow-up in 3 months.   [name], MD 05/15/2018, 10:22 AM

## 2018-05-15 NOTE — Progress Notes (Signed)
Patient arrived today with appropriate affect and mood for is biweekly shot of Prolixin 25 mg. Patient states he is doing well, no side effects, no V/A hallucinations.Patient is happy because since lowering his dose, he no longer has the leg pain that has been bothering him for so long.Injection of Prolixin 25 mg was prepared as ordered and administered in patients rightdeltoid. Patient tolerated well and without complaint. Patient will return in two weeks for his next injection

## 2018-05-29 ENCOUNTER — Ambulatory Visit (INDEPENDENT_AMBULATORY_CARE_PROVIDER_SITE_OTHER): Payer: Medicare Other

## 2018-05-29 DIAGNOSIS — F200 Paranoid schizophrenia: Secondary | ICD-10-CM

## 2018-05-29 NOTE — Progress Notes (Signed)
Patient arrived today with appropriate affect and mood for is biweekly shot of Prolixin 25 mg. Patient states he is doing well, no side effects, no V/A hallucinations.Patient is happy because since lowering his dose, he no longer has the leg pain that has been bothering him for so long.Injection of Prolixin 25 mg was prepared as ordered and administered in patientsleftdeltoid. Patient tolerated well and without complaint. Patient will return in two weeks for his next injection

## 2018-06-03 DIAGNOSIS — M5416 Radiculopathy, lumbar region: Secondary | ICD-10-CM | POA: Diagnosis not present

## 2018-06-12 ENCOUNTER — Ambulatory Visit (INDEPENDENT_AMBULATORY_CARE_PROVIDER_SITE_OTHER): Payer: Medicare Other

## 2018-06-12 DIAGNOSIS — F200 Paranoid schizophrenia: Secondary | ICD-10-CM | POA: Diagnosis not present

## 2018-06-12 NOTE — Progress Notes (Signed)
Patient arrived today with appropriate affect and mood for is biweekly shot of Prolixin 25 mg. Patient states he is doing well, no side effects, no V/A hallucinations.Injection of Prolixin 25 mg was prepared as ordered and administered in patientsrightdeltoid. Patient tolerated well and without complaint. Patient will return in two weeks for his next injection

## 2018-06-18 ENCOUNTER — Other Ambulatory Visit: Payer: Self-pay | Admitting: Internal Medicine

## 2018-06-18 DIAGNOSIS — E7849 Other hyperlipidemia: Secondary | ICD-10-CM

## 2018-06-28 ENCOUNTER — Encounter (HOSPITAL_COMMUNITY): Payer: Self-pay

## 2018-06-28 ENCOUNTER — Ambulatory Visit (INDEPENDENT_AMBULATORY_CARE_PROVIDER_SITE_OTHER): Payer: Medicare Other

## 2018-06-28 VITALS — Ht 72.0 in | Wt 228.0 lb

## 2018-06-28 DIAGNOSIS — F200 Paranoid schizophrenia: Secondary | ICD-10-CM | POA: Diagnosis not present

## 2018-06-28 NOTE — Progress Notes (Signed)
Patient arrived today with appropriate affect and mood for is biweekly shot of Prolixin 25 mg. Patient states he is doing well, no side effects, no V/A hallucinations.Injection of Prolixin 25 mg was prepared as ordered and administered in patientsleftdeltoid. Patient tolerated well and without complaint. Patient will return in two weeks for his next injection.

## 2018-07-09 ENCOUNTER — Encounter: Payer: Self-pay | Admitting: Internal Medicine

## 2018-07-09 ENCOUNTER — Encounter (INDEPENDENT_AMBULATORY_CARE_PROVIDER_SITE_OTHER): Payer: Self-pay

## 2018-07-09 ENCOUNTER — Ambulatory Visit (INDEPENDENT_AMBULATORY_CARE_PROVIDER_SITE_OTHER): Payer: Medicare Other | Admitting: Internal Medicine

## 2018-07-09 VITALS — BP 114/75 | HR 93 | Temp 97.7°F | Ht 72.0 in | Wt 227.6 lb

## 2018-07-09 DIAGNOSIS — N401 Enlarged prostate with lower urinary tract symptoms: Secondary | ICD-10-CM

## 2018-07-09 DIAGNOSIS — F17210 Nicotine dependence, cigarettes, uncomplicated: Secondary | ICD-10-CM

## 2018-07-09 DIAGNOSIS — I10 Essential (primary) hypertension: Secondary | ICD-10-CM

## 2018-07-09 DIAGNOSIS — R351 Nocturia: Secondary | ICD-10-CM

## 2018-07-09 DIAGNOSIS — E1169 Type 2 diabetes mellitus with other specified complication: Secondary | ICD-10-CM

## 2018-07-09 DIAGNOSIS — Z79899 Other long term (current) drug therapy: Secondary | ICD-10-CM

## 2018-07-09 DIAGNOSIS — M5416 Radiculopathy, lumbar region: Secondary | ICD-10-CM

## 2018-07-09 DIAGNOSIS — Z8739 Personal history of other diseases of the musculoskeletal system and connective tissue: Secondary | ICD-10-CM

## 2018-07-09 DIAGNOSIS — F17200 Nicotine dependence, unspecified, uncomplicated: Secondary | ICD-10-CM

## 2018-07-09 DIAGNOSIS — F200 Paranoid schizophrenia: Secondary | ICD-10-CM

## 2018-07-09 LAB — POCT GLYCOSYLATED HEMOGLOBIN (HGB A1C): Hemoglobin A1C: 6.8 % — AB (ref 4.0–5.6)

## 2018-07-09 LAB — GLUCOSE, CAPILLARY: Glucose-Capillary: 132 mg/dL — ABNORMAL HIGH (ref 70–99)

## 2018-07-09 NOTE — Assessment & Plan Note (Signed)
-  Patient continues to smoke about 10 cigarettes a day.  He states that 1 pack lasts him 2 to 3 days -He states that he will try to gradually come off this on his own -We will continue to follow-up with him for this

## 2018-07-09 NOTE — Assessment & Plan Note (Signed)
-  Patient states that his back pain is now resolved -He is on gabapentin at home (300 mg 3 times a day) -He would like to come off this medication as he has not had further back pain since he had a spinal injection -I will discontinue his gabapentin for now -I advised the patient to restart the medication if his pain recurs -He will let me know what happens

## 2018-07-09 NOTE — Assessment & Plan Note (Signed)
BP Readings from Last 3 Encounters:  07/09/18 114/75  05/15/18 130/78  05/15/18 132/76    Lab Results  Component Value Date   NA 141 11/27/2017   K 4.5 11/27/2017   CREATININE 1.16 11/27/2017    Assessment: Blood pressure control:  Well controlled Progress toward BP goal:   At goal Comments: Patient is compliant with losartan 100 mg as well as doxazosin 4 mg  Plan: Medications:  continue current medications Educational resources provided:   Self management tools provided:   Other plans:

## 2018-07-09 NOTE — Assessment & Plan Note (Signed)
Lab Results  Component Value Date   HGBA1C 6.8 (A) 07/09/2018   HGBA1C 7.1 (A) 02/26/2018   HGBA1C 6.7 (A) 11/27/2017     Assessment: Diabetes control:  Well controlled Progress toward A1C goal:   At goal Comments: Patient states that he ran out of his Jardiance 2 months ago and has not taken this since.  Plan: Medications:  We will stop his Jardiance for now and continue with diet control as his A1c is at goal off of this medication Home glucose monitoring: Frequency:   Timing:   Instruction/counseling given: no instruction/counseling  Educational resources provided:   Self management tools provided:   Other plans: We will recheck his A1c in 3 months.  If his A1c goes up we will restart his Jardiance at that time.  Patient is in agreement with plan

## 2018-07-09 NOTE — Assessment & Plan Note (Signed)
-  This problem is chronic and stable -Patient follows up with psychiatry for this and had his Prolixin dose decreased -He states that his mood and affect have been stable on this lower dose -He will continue to follow-up with psychiatry for this -No further work-up at this time

## 2018-07-09 NOTE — Patient Instructions (Signed)
-  It was a pleasure seeing you today -Your blood pressure and diabetes are well controlled.  Keep up the great work! -Please follow-up with me in 3 months when we will recheck your A1c and see if you need to restart her Jardiance or not -I have stopped the gabapentin for now.  Please call me and let me know if you need a refill on this medication if your back pain recurs -Please follow-up with the back specialist for your back pain -Please call me if you have any questions

## 2018-07-09 NOTE — Assessment & Plan Note (Signed)
-  This problem is chronic and stable -Patient states that he has not refilled his Flomax in a couple months -He feels his urinary symptoms are stable off this medication and would not like to continue this at this time -We will continue with doxazosin for now -Patient follow-up with urology as an outpatient -No further work-up at this

## 2018-07-09 NOTE — Progress Notes (Signed)
   Subjective:    Patient ID: Wayne Schmidt, male    DOB: 1956-01-08, 63 y.o.   MRN: [id number]  HPI  I have seen and examined this patient.  Patient is here for routine follow-up of his hypertension and diabetes.  Patient denies any new complaints at this time.  He states that his back pain has now resolved.  He stopped taking his Jardiance 2 months ago as he did not receive a refill of this.  He also states that he stopped taking his Flomax as well.  He would also like to know if he could come off his gabapentin since his back pain is improved. He states that he is compliant with his other medications.  Review of Systems  Constitutional: Negative.   HENT: Negative.   Respiratory: Negative.   Cardiovascular: Negative.   Gastrointestinal: Negative.   Musculoskeletal: Negative.   Neurological: Negative.   Psychiatric/Behavioral: Negative.        Objective:   Physical Exam Constitutional:      Appearance: Normal appearance.  HENT:     Head: Normocephalic and atraumatic.     Mouth/Throat:     Pharynx: Oropharynx is clear. No oropharyngeal exudate.  Neck:     Musculoskeletal: Neck supple.  Cardiovascular:     Rate and Rhythm: Normal rate and regular rhythm.     Heart sounds: Normal heart sounds.  Pulmonary:     Effort: Pulmonary effort is normal.     Breath sounds: Normal breath sounds. No wheezing or rales.  Abdominal:     General: Bowel sounds are normal.     Palpations: Abdomen is soft.     Tenderness: There is no abdominal tenderness. There is no guarding.  Musculoskeletal:        General: No swelling.  Lymphadenopathy:     Cervical: No cervical adenopathy.  Neurological:     Mental Status: He is alert and oriented to person, place, and time. Mental status is at baseline.  Psychiatric:        Mood and Affect: Mood normal.        Behavior: Behavior normal.           Assessment & Plan:  Please see problem based charting for assessment and plan:

## 2018-07-11 ENCOUNTER — Encounter (HOSPITAL_COMMUNITY): Payer: Self-pay

## 2018-07-11 ENCOUNTER — Ambulatory Visit (INDEPENDENT_AMBULATORY_CARE_PROVIDER_SITE_OTHER): Payer: Medicare Other

## 2018-07-11 DIAGNOSIS — F200 Paranoid schizophrenia: Secondary | ICD-10-CM | POA: Diagnosis not present

## 2018-07-11 NOTE — Progress Notes (Signed)
Patient presented with flat affect, level mood and denied any auditory or visual hallucinations, no suicidal or homicidal ideations and reported no problems with current lower dosage of Prolixin Decanoate every 2 weeks to 25 mg/1 ml.  Patient reported his blood pressure has been doing better and his PCP was able to take him off several medications for his diabetes and blood pressure.  Verified all current medications with patient and prepared due Prolixin Decanoate 25 mg/1 mL IM injection as ordered.  Administered to patient in his right deltoid area and patient tolerated due injection without any complaint of pain or discomfort.  Patient agreed to call if any problems until returns in 2 weeks for next due injection and stated things going okay now living with his daughter at this time.  Patient stated he felt better on lower dosage of Prolixin Decanoate every 2 weeks.

## 2018-07-25 ENCOUNTER — Ambulatory Visit (INDEPENDENT_AMBULATORY_CARE_PROVIDER_SITE_OTHER): Payer: Medicare Other

## 2018-07-25 DIAGNOSIS — F200 Paranoid schizophrenia: Secondary | ICD-10-CM | POA: Diagnosis not present

## 2018-07-25 NOTE — Progress Notes (Signed)
Patient presented with flat affect, level mood and denied any auditory or visual hallucinations, no suicidal or homicidal ideations and reported no problems with current lower dosage of Prolixin Decanoate every 2 weeks to 25 mg/1 ml. Verified all current medications with patient and prepared due Prolixin Decanoate 25 mg/1 mL IM injection as ordered.  Administered to patient in his left deltoid area and patient tolerated due injection without any complaint of pain or discomfort.  Patient agreed to call if any problems until returns in 2 weeks for next due injection and stated things going okay now living with his daughter at this time.  Patient stated he felt better on lower dosage of Prolixin Decanoate every 2 weeks.

## 2018-08-05 DIAGNOSIS — M5416 Radiculopathy, lumbar region: Secondary | ICD-10-CM | POA: Diagnosis not present

## 2018-08-05 DIAGNOSIS — M549 Dorsalgia, unspecified: Secondary | ICD-10-CM | POA: Diagnosis not present

## 2018-08-08 ENCOUNTER — Ambulatory Visit (INDEPENDENT_AMBULATORY_CARE_PROVIDER_SITE_OTHER): Payer: Medicare Other

## 2018-08-08 DIAGNOSIS — F200 Paranoid schizophrenia: Secondary | ICD-10-CM | POA: Diagnosis not present

## 2018-08-08 NOTE — Progress Notes (Signed)
Patient presented with flat affect, level mood and denied any auditory or visual hallucinations, no suicidal or homicidal ideations and reported no problems with current lower dosage of Prolixin Decanoate every 2 weeks to 25 mg/1 ml. Verified all current medications with patient and prepared due Prolixin Decanoate 25 mg/1 mL IM injection as ordered. Administered to patient in his right deltoid area and patient tolerated due injection without any complaint of pain or discomfort. Patient agreed to call if any problems until returns in 2 weeks for next due injection and stated things going okay now living with his daughter at this time. Patient stated he felt better on lower dosage of Prolixin Decanoate every 2 weeks.

## 2018-08-15 ENCOUNTER — Ambulatory Visit (INDEPENDENT_AMBULATORY_CARE_PROVIDER_SITE_OTHER): Payer: Medicare Other | Admitting: Psychiatry

## 2018-08-15 DIAGNOSIS — F200 Paranoid schizophrenia: Secondary | ICD-10-CM

## 2018-08-15 DIAGNOSIS — G2401 Drug induced subacute dyskinesia: Secondary | ICD-10-CM

## 2018-08-15 MED ORDER — FLUPHENAZINE DECANOATE 25 MG/ML IJ SOLN: 25.0000 mg | INTRAMUSCULAR | 2 refills | Status: DC

## 2018-08-15 NOTE — Progress Notes (Signed)
[place] MD/PA/NP OP Progress Note  08/15/2018 10:06 AM Wayne Schmidt  MRN:  [id number]  Chief Complaint: I am doing fine.  I am sleeping good.  HPI: Wayne Schmidt came for his appointment.  He is getting Prolixin injection every 4 weeks.  He has mild tremors but he does not want to take any medication.  He is sleeping good.  He denies any hallucination but does not leave the house unless it is important.  He tends to be guarded and minimizes symptoms but is not aggressive, agitated or having any suicidal thoughts or homicidal thought.  He lives with his daughter.  He is not happy with his living situation but did not provide more details.  He is not drinking or using any illegal substances.  Ported injection is helping his mood irritability and hallucination.  His appetite is okay.  His energy level is okay.  Visit Diagnosis:    ICD-10-CM   1. Paranoid schizophrenia, subchronic condition (HCC) F20.0 fluPHENAZine decanoate (PROLIXIN) 25 MG/ML injection  2. Tardive dyskinesia G24.01 fluPHENAZine decanoate (PROLIXIN) 25 MG/ML injection    Past Psychiatric History: Reviewed. H/O schizophrenia with his 16-20 times inpatient treatment.  History of hallucination, paranoia and suicidal attempt by taking overdose.  Last admission at [facility].  Took Haldol and Thorazine but had side effects.  On Prolixin injection for past 30 years.   Past Medical History:  Past Medical History:  Diagnosis Date  . Diabetes mellitus without complication ([place])   . High risk sexual behavior 10/2007  . Hypertension   . Microcytosis   . Schizophrenia ([place])   . Substance abuse ([place])    tobacco use    Past Surgical History:  Procedure Laterality Date  . LAMINECTOMY     by Dr Rolin Barry    Family Psychiatric History: Reviewed.  Family History:  Family History  Problem Relation Age of Onset  . Diabetes Mother   . Hypertension Mother   . Lupus Sister   . Sickle cell anemia Brother   . Drug abuse Brother     Social  History:  Social History   Socioeconomic History  . Marital status: Legally Separated    Spouse name: Not on file  . Number of children: Not on file  . Years of education: Not on file  . Highest education level: Not on file  Occupational History  . Not on file  Social Needs  . Financial resource strain: Not on file  . Food insecurity:    Worry: Not on file    Inability: Not on file  . Transportation needs:    Medical: Not on file    Non-medical: Not on file  Tobacco Use  . Smoking status: Current Every Day Smoker    Packs/day: 0.75    Years: 38.00    Pack years: 28.50    Types: Cigarettes  . Smokeless tobacco: Never Used  . Tobacco comment: Has cut back to 1/2 pack a day but not ready to quit  Substance and Sexual Activity  . Alcohol use: Yes    Alcohol/week: 0.0 standard drinks    Frequency: Never    Comment: Occasional a 16 oz beer once a month   . Drug use: No    Comment: h/o remote cocaine use-denies present use  . Sexual activity: Not Currently  Lifestyle  . Physical activity:    Days per week: Not on file    Minutes per session: Not on file  . Stress: Not on file  Relationships  .  Social connections:    Talks on phone: Not on file    Gets together: Not on file    Attends religious service: Not on file    Active member of club or organization: Not on file    Attends meetings of clubs or organizations: Not on file    Relationship status: Not on file  Other Topics Concern  . Not on file  Social History Narrative   Patient given diabetes card 07/07/2010    Allergies:  Allergies  Allergen Reactions  . Ace Inhibitors Cough  . Atorvastatin Other (See Comments)    Dizzy / Faint  . Chlorpromazine Hcl Other (See Comments)    Passed out   . Simvastatin Other (See Comments)    Dizzy / Faint    Metabolic Disorder Labs: Lab Results  Component Value Date   HGBA1C 6.8 (A) 07/09/2018   MPG 163 08/24/2016   No results found for: PROLACTIN Lab Results   Component Value Date   CHOL 172 11/27/2017   TRIG 60 11/27/2017   HDL 64 11/27/2017   CHOLHDL 2.7 11/27/2017   VLDL 11 04/01/2014   LDLCALC 96 11/27/2017   LDLCALC 85 12/12/2016   Lab Results  Component Value Date   TSH 1.470 12/12/2016   TSH 1.647 10/29/2008    Therapeutic Level Labs: No results found for: LITHIUM No results found for: VALPROATE No components found for:  CBMZ  Current Medications: Current Outpatient Medications  Medication Sig Dispense Refill  . doxazosin (CARDURA) 4 MG tablet TAKE (1) TABLET BY MOUTH ONCE DAILY. 30 tablet 11  . fluPHENAZine decanoate (PROLIXIN) 25 MG/ML injection Inject 1 mL (25 mg total) into the muscle every 14 (fourteen) days. 5 mL 2  . gabapentin (NEURONTIN) 300 MG capsule     . hydroxypropyl methylcellulose / hypromellose (ISOPTO TEARS / GONIOVISC) 2.5 % ophthalmic solution Place 2 drops into both eyes daily as needed for dry eyes.    Marland Kitchen losartan (COZAAR) 100 MG tablet TAKE (1) TABLET BY MOUTH ONCE DAILY. 30 tablet 11  . pravastatin (PRAVACHOL) 40 MG tablet Take 1 tablet (40 mg total) by mouth daily. 90 tablet 3   Current Facility-Administered Medications  Medication Dose Route Frequency Provider Last Rate Last Dose  . fluPHENAZine decanoate (PROLIXIN) injection 25 mg  25 mg Intramuscular Q14 Days Chana Lindstrom, Arlyce Harman, MD   25 mg at 08/08/18 1129     Musculoskeletal: Strength & Muscle Tone: within normal limits Gait & Station: normal Patient leans: N/A  Psychiatric Specialty Exam: ROS  Blood pressure 132/80, pulse 88, height 6' 0.5" (1.842 m), weight 225 lb (102.1 kg).Body mass index is 30.1 kg/m.  General Appearance: Casual  Eye Contact:  Fair  Speech:  Slow  Volume:  Decreased  Mood:  Euthymic  Affect:  Restricted  Thought Process:  Descriptions of Associations: Intact  Orientation:  Full (Time, Place, and Person)  Thought Content: Rumination   Suicidal Thoughts:  No  Homicidal Thoughts:  No  Memory:  Immediate;    Fair Recent;   Fair Remote;   Fair  Judgement:  Fair  Insight:  Present  Psychomotor Activity:  Decreased and Tremor  Concentration:  Concentration: Fair and Attention Span: Fair  Recall:  [employer] of Knowledge: Fair  Language: Fair  Akathisia:  No  Handed:  Right  AIMS (if indicated): not done  Assets:  Desire for Improvement Housing  ADL's:  Intact  Cognition: WNL  Sleep:  Fair   Screenings: PHQ2-9  Office Visit from 07/09/2018 in [place] Office Visit from 02/26/2018 in [place] Office Visit from 01/09/2018 in [place] Office Visit from 12/04/2017 in [place] Office Visit from 11/27/2017 in [place]  PHQ-2 Total Score  0  0  0  0  0  PHQ-9 Total Score  0  -  -  -  -       Assessment and Plan: Schizophrenia chronic paranoid type.  Tardive dyskinesia.  Patient does not want to take any medication for tremors.  He like to continue Prolixin 25 mg intramuscular injection every month.  He has no other concerns.  He has been compliant with injection.  Recommended to call us back if he has any question or any concern.  Follow-up in 3 months.   [name], MD 08/15/2018, 10:06 AM

## 2018-08-22 ENCOUNTER — Ambulatory Visit (INDEPENDENT_AMBULATORY_CARE_PROVIDER_SITE_OTHER): Payer: Medicare Other

## 2018-08-22 DIAGNOSIS — F200 Paranoid schizophrenia: Secondary | ICD-10-CM | POA: Diagnosis not present

## 2018-08-22 NOTE — Progress Notes (Signed)
Patient presented with flat affect, level mood and denied any auditory or visual hallucinations, no suicidal or homicidal ideations and reported no problems with current lower dosage of Prolixin Decanoate every 2 weeks to 25 mg/1 ml. Verified all current medications with patient and prepared due Prolixin Decanoate 25 mg/1 mL IM injection as ordered. Administered to patient in hisleftdeltoid area and patient tolerated due injection without any complaint of pain or discomfort. Patient agreed to call if any problems until returns in 2 weeks for next due injection.

## 2018-09-05 ENCOUNTER — Ambulatory Visit (INDEPENDENT_AMBULATORY_CARE_PROVIDER_SITE_OTHER): Payer: Medicare Other

## 2018-09-05 DIAGNOSIS — F200 Paranoid schizophrenia: Secondary | ICD-10-CM | POA: Diagnosis not present

## 2018-09-05 NOTE — Progress Notes (Signed)
Patient presented with flat affect, level mood and denied any auditory or visual hallucinations, no suicidal or homicidal ideations and reported no problems with current lower dosage of Prolixin Decanoate every 2 weeks to 25 mg/1 ml. Verified all current medications with patient and prepared due Prolixin Decanoate 25 mg/1 mL IM injection as ordered. Patient is doing well and is getting ready to move into his new apartment with his daughtrers help. Administered to patient in [place] area and patient tolerated due injection without any complaint of pain or discomfort. Patient agreed to call if any problems until returns in 2 weeks for next due injection.

## 2018-09-18 ENCOUNTER — Other Ambulatory Visit: Payer: Self-pay

## 2018-09-18 ENCOUNTER — Telehealth: Payer: Self-pay | Admitting: *Deleted

## 2018-09-18 ENCOUNTER — Ambulatory Visit (INDEPENDENT_AMBULATORY_CARE_PROVIDER_SITE_OTHER): Payer: Medicare Other | Admitting: Internal Medicine

## 2018-09-18 ENCOUNTER — Encounter: Payer: Self-pay | Admitting: Internal Medicine

## 2018-09-18 ENCOUNTER — Encounter (INDEPENDENT_AMBULATORY_CARE_PROVIDER_SITE_OTHER): Payer: Self-pay

## 2018-09-18 VITALS — BP 128/71 | HR 95 | Temp 99.0°F | Ht 72.0 in | Wt 226.0 lb

## 2018-09-18 DIAGNOSIS — F17210 Nicotine dependence, cigarettes, uncomplicated: Secondary | ICD-10-CM

## 2018-09-18 DIAGNOSIS — R0602 Shortness of breath: Secondary | ICD-10-CM | POA: Diagnosis not present

## 2018-09-18 DIAGNOSIS — R05 Cough: Secondary | ICD-10-CM

## 2018-09-18 MED ORDER — PREDNISONE 20 MG PO TABS: 40.0000 mg | ORAL_TABLET | Freq: Every day | ORAL | 0 refills | Status: DC

## 2018-09-18 MED ORDER — ALBUTEROL SULFATE (2.5 MG/3ML) 0.083% IN NEBU
2.5000 mg | INHALATION_SOLUTION | Freq: Once | RESPIRATORY_TRACT | Status: AC
  Administered 2018-09-18: 2.5 mg via RESPIRATORY_TRACT

## 2018-09-18 NOTE — Telephone Encounter (Signed)
Yes. I think patient is ok for [facility] visit if he would like. Thanks

## 2018-09-18 NOTE — Progress Notes (Signed)
   CC: cough   HPI:  Wayne Schmidt is a 63 y.o. with PMH as below presenting with cough for three weeks. He has had cough and gradually increasing shortness of breath for the past three weeks. He states he may have had a fever yesterday and endorses some chills. He has had some dizziness and feeling weak with his shortness of breath. He states cough is non-productive, denies sinus pain, congestion, and pleurisy. He states he smokes 10 cigarettes per day and has continued to smoke while being sick. He has not traveled. His daughter recently had a cold several weeks ago but has no other sick contacts. He has not traveled either. He states he has been eating and drinking and does not have decreased appetite  Please see A&P for assessment of the patient's acute and chronic medical conditions.    Past Medical History:  Diagnosis Date  . Diabetes mellitus without complication ([place])   . High risk sexual behavior 10/2007  . Hypertension   . Microcytosis   . Schizophrenia ([place])   . Substance abuse ([place])    tobacco use   Review of Systems:   See HPI, ROS otherwise negative  Physical Exam:  Constitution: NAD, well-nourished HENT: no sinus tenderness, no pharyngeal erythema Eyes: no icterus or injection  Cardio: decreased heart sounds, no m/r/g  Respiratory: decreased breath sounds, CTA  MSK: no edema, moving all extremities  Neuro: flat affect, cooperative  Skin: c/d/i    Vitals:   09/18/18 1328  BP: 128/71  Pulse: 95  Temp: 99 F (37.2 C)  TempSrc: Oral  SpO2: 96%  Weight: 226 lb (102.5 kg)  Height: 6' (1.829 m)     Assessment & Plan:   See Encounters Tab for problem based charting.  Patient discussed with Dr. Beryle Beams

## 2018-09-18 NOTE — Telephone Encounter (Signed)
Pt thinks he has bronchitis, c/o runny nose, cough, and pain in "bottom part of my lung"  for 4-5 days  In the 14 days prior to your symptom onset, did you travel to an area with a high prevalence of COVID? No  In the 14 days prior to your symptom onset, did you have close contact with someone with COVID? No  Do you have a fever? No  Do you have a cough? Yes- (nonproductive)  Do you have shortness of breath more than normal? No "not really" states shortness of breath gets worse after smoking about 1/2 pack of cigarettes.  Do you have chest pain?No "pain in the bottom part of my lung" Are you able to eat and drink normally? Yes but not as much Have you seen a physician for these symptoms? No  Action pt to be seen today in [facility] @ 1:15pm and instructed to get mask from front desk on arrival.[place], Darlene Cassady3/18/20209:42 AM  Is this patient appropriate for [facility] today?

## 2018-09-18 NOTE — Patient Instructions (Signed)
Thank you for allowing [place] to provide your care today. Today we discussed your cough.   Today we made the following changes to your medications:   Please START taking  Prednisone (DELTASONE) 40 MG tablet - take 2 tablets in the morning for five days.   If your symptoms do not improve or worsen please call the clinic to follow-up.   Please try to decrease your smoking as this will make your cough and shortness of breath worse.   Should you have any questions or concerns please call the internal medicine clinic at [phone].    Coronavirus (COVID-19) Are you at risk?  Are you at risk for the Coronavirus (COVID-19)?  To be considered HIGH RISK for Coronavirus (COVID-19), you have to meet the following criteria:  . Traveled to [place], [place], [place], [place] or [place]; or in the [place] to [place], [place], [place], or [place]; and have fever, cough, and shortness of breath within the last 2 weeks of travel OR . Been in close contact with a person diagnosed with COVID-19 within the last 2 weeks and have fever, cough, and shortness of breath . IF YOU DO NOT MEET THESE CRITERIA, YOU ARE CONSIDERED LOW RISK FOR COVID-19.  What to do if you are HIGH RISK for COVID-19?  Marland Kitchen If you are having a medical emergency, call 911. . Seek medical care right away. Before you go to a doctor's office, urgent care or emergency department, call ahead and tell them about your recent travel, contact with someone diagnosed with COVID-19, and your symptoms. You should receive instructions from your physician's office regarding next steps of care.  . When you arrive at healthcare provider, tell the healthcare staff immediately you have returned from visiting [place], [place], [place], [place] or [place]; or traveled in the [place] to [place], [place], [place], or [place]; in the last two weeks or you have been in close contact with a person diagnosed with COVID-19 in the last 2 weeks.    . Tell the health care staff about your symptoms: fever, cough and shortness of breath. . After you have been seen by a medical provider, you will be either: o Tested for (COVID-19) and discharged home on quarantine except to seek medical care if symptoms worsen, and asked to  - Stay home and avoid contact with others until you get your results (4-5 days)  - Avoid travel on public transportation if possible (such as bus, train, or airplane) or o Sent to the Emergency Department by EMS for evaluation, COVID-19 testing, and possible admission depending on your condition and test results.  What to do if you are LOW RISK for COVID-19?  Reduce your risk of any infection by using the same precautions used for avoiding the common cold or flu:  Marland Kitchen Wash your hands often with soap and warm water for at least 20 seconds.  If soap and water are not readily available, use an alcohol-based hand sanitizer with at least 60% alcohol.  . If coughing or sneezing, cover your mouth and nose by coughing or sneezing into the elbow areas of your shirt or coat, into a tissue or into your sleeve (not your hands). . Avoid shaking hands with others and consider head nods or verbal greetings only. . Avoid touching your eyes, nose, or mouth with unwashed hands.  . Avoid close contact with people who are sick. . Avoid places or events with large numbers of people in one  location, like concerts or sporting events. . Carefully consider travel plans you have or are making. . If you are planning any travel outside or inside the [place], visit the CDC's Travelers' Health webpage for the latest health notices. . If you have some symptoms but not all symptoms, continue to monitor at home and seek medical attention if your symptoms worsen. . If you are having a medical emergency, call 911.   [place] / e-Visit: [email or web address]          [facility]: [facility]: [phone]                   [facility]: [phone]

## 2018-09-18 NOTE — Assessment & Plan Note (Addendum)
He has had cough and gradually increasing shortness of breath for the past three weeks. He states he may have had a fever yesterday and endorses some chills. He has had some dizziness and feeling weak with his shortness of breath. He states cough is non-productive, denies sinus pain, congestion, and pleurisy. He states he smokes 10 cigarettes per day and has continued to smoke while being sick. He has not traveled. His daughter recently had a cold several weeks ago but has no other sick contacts. He has not traveled either. He states he has been eating and drinking and does not have decreased appetite. PE significant for decreased breath sounds and heart sounds. O2 saturation dropped to 91% when standing and then increased to 96%. On ambulation he decreased to 93%. He likely has undiagnosed obstructive disease with his history of long term smoking and symptoms and this may be exacerbation with his current increased dyspnea and cough.   - orthostatics done - blood pressure dropped from systolic 569 laying down to 106 after three minutes standing. He is on an alpha blocker which may be the cause of his orthostatic hypotension. - consider decreasing doxazosin if dizziness continues. He does not want to change his medications today - albuterol nebulizer - prednisone 40 MG qd  - f/u if symptoms do not improve - PFTs once symptoms improve  - discussed stopping/decreasing tobacco use

## 2018-09-19 ENCOUNTER — Other Ambulatory Visit: Payer: Self-pay

## 2018-09-19 ENCOUNTER — Ambulatory Visit (INDEPENDENT_AMBULATORY_CARE_PROVIDER_SITE_OTHER): Payer: Medicare Other

## 2018-09-19 ENCOUNTER — Encounter (HOSPITAL_COMMUNITY): Payer: Self-pay

## 2018-09-19 VITALS — BP 120/74 | HR 73 | Temp 98.1°F | Resp 16 | Wt 224.6 lb

## 2018-09-19 DIAGNOSIS — F200 Paranoid schizophrenia: Secondary | ICD-10-CM | POA: Diagnosis not present

## 2018-09-19 NOTE — Progress Notes (Signed)
Patient presented with flat affect, depressed mood and denied any auditory or visual hallucinations, no suicidal or homicidal ideations and reported no problems with current lower dosage of Prolixin Decanoate every 2 weeks to 25 mg/1 ml. Patient did not seem himself today. Patient was having issues with his thought process. Patient was not interactive like normal, patient did not laugh or smile. Verified all current medications with patient and prepared due Prolixin Decanoate 25 mg/1 mL IM injection as ordered. Administered to patient in hisleftdeltoid area and patient tolerated due injection without any complaint of pain or discomfort. Patient agreed to call if any problems until returns in 2 weeks for next due injection.

## 2018-09-19 NOTE — Progress Notes (Signed)
Medicine attending: Medical history, presenting problems, physical findings, and medications, reviewed with resident physician Dr Molli Hazard on the day of the patient visit and I concur with her evaluation and management plan.

## 2018-10-02 ENCOUNTER — Other Ambulatory Visit: Payer: Self-pay

## 2018-10-02 ENCOUNTER — Ambulatory Visit (INDEPENDENT_AMBULATORY_CARE_PROVIDER_SITE_OTHER): Payer: Medicare Other

## 2018-10-02 DIAGNOSIS — F200 Paranoid schizophrenia: Secondary | ICD-10-CM

## 2018-10-02 NOTE — Progress Notes (Signed)
Patient presented today with a flat affect and good mood. Patient was more himself today, he has moved to a new apartment and he is happy about this. Patients injection of Prolixin Decanoate 25 mg/mL was prepared as ordered and 1 mL was administered in patients right deltoid. Patient will call with any questions or concerns and will return in 2 weeks for his next scheduled injection.

## 2018-10-17 ENCOUNTER — Ambulatory Visit (INDEPENDENT_AMBULATORY_CARE_PROVIDER_SITE_OTHER): Payer: Medicare Other

## 2018-10-17 ENCOUNTER — Other Ambulatory Visit: Payer: Self-pay

## 2018-10-17 DIAGNOSIS — F200 Paranoid schizophrenia: Secondary | ICD-10-CM

## 2018-10-17 NOTE — Progress Notes (Signed)
Patient presented today with a flat affect and good mood. Patient was more himself today, he has moved to a new apartment and he is happy about this. Patients injection of Prolixin Decanoate 25 mg/mL was prepared as ordered and 1 mL was administered in patients left deltoid. Patient will call with any questions or concerns and will return in 2 weeks for his next scheduled injection.

## 2018-10-22 ENCOUNTER — Other Ambulatory Visit: Payer: Self-pay

## 2018-10-22 NOTE — Patient Outreach (Signed)
[place] [facility] Care Management  10/22/2018  Tulio Facundo [birth year] [id number]   Medication Adherence call to Mr. Taggert Bozzi patient did not answer.Mr. Farnan is showing past due on Pravastatin 40 mg and Losartan 100 mg under [place].   [facility] Management Direct Dial [phone]  Fax [phone] [email or web address]

## 2018-10-31 ENCOUNTER — Other Ambulatory Visit: Payer: Self-pay

## 2018-10-31 ENCOUNTER — Ambulatory Visit (INDEPENDENT_AMBULATORY_CARE_PROVIDER_SITE_OTHER): Payer: Medicare Other

## 2018-10-31 DIAGNOSIS — F200 Paranoid schizophrenia: Secondary | ICD-10-CM

## 2018-10-31 NOTE — Progress Notes (Signed)
Patient presented today with a flat affect and good mood. Patient was more himself today, he has moved to a new apartment and he is happy about this. Patients injection of Prolixin Decanoate 25 mg/mL was prepared as ordered and 1 mL was administered in patients right deltoid. Patient will call with any questions or concerns and will return in 2 weeks for his next scheduled injection.

## 2018-11-13 ENCOUNTER — Encounter (HOSPITAL_COMMUNITY): Payer: Self-pay | Admitting: Psychiatry

## 2018-11-13 ENCOUNTER — Other Ambulatory Visit: Payer: Self-pay

## 2018-11-13 ENCOUNTER — Ambulatory Visit (INDEPENDENT_AMBULATORY_CARE_PROVIDER_SITE_OTHER): Payer: Medicare Other | Admitting: Psychiatry

## 2018-11-13 DIAGNOSIS — F200 Paranoid schizophrenia: Secondary | ICD-10-CM

## 2018-11-13 DIAGNOSIS — G2401 Drug induced subacute dyskinesia: Secondary | ICD-10-CM | POA: Diagnosis not present

## 2018-11-13 MED ORDER — FLUPHENAZINE DECANOATE 25 MG/ML IJ SOLN: 25.0000 mg | INTRAMUSCULAR | 2 refills | Status: DC

## 2018-11-13 NOTE — Progress Notes (Signed)
Virtual Visit via Telephone Note  I connected with Wayne Schmidt on 11/13/18 at 10:00 AM EDT by telephone and verified that I am speaking with the correct person using two identifiers.   I discussed the limitations, risks, security and privacy concerns of performing an evaluation and management service by telephone and the availability of in person appointments. I also discussed with the patient that there may be a patient responsible charge related to this service. The patient expressed understanding and agreed to proceed.   History of Present Illness: Patient was evaluated by phone session.  He is getting his Prolixin injection every 4 weeks.  He admitted mild tremors but it does not interfere with his life and he does not want to take medication.  We had tried hydroxyzine in the past but it make him more drowsy.  Lately he has noticed difficulty in urination and he has to wake up multiple times in the night to go to bathroom.  He has upcoming appointment with his primary care physician and he will discuss this issue with him.  Patient feels the current medicine is working and he does not have any hallucination, paranoia or any agitation.  He is pleased that he moved out from his daughter's house and now he is living on his own.  However he does go to daughter's house most of the time for dinner.  He enjoyed his freedom.  Due to COVID-19 he takes extra precaution and does not go outside unnecessary.  But he usually walks around those apartment complex every day.  Patient denies drinking or using any illegal substances.  He like to continue his current Prolixin.   Past Psychiatric History: Reviewed. H/O schizophrenia with his 16-20 times inpatient treatment.  History of hallucination, paranoia and suicidal attempt by taking overdose.  Last admission at [facility].  Took Haldol and Thorazine but had side effects.  On Prolixin injection for past 30 years.  Tried hydroxyzine but could not  tolerate.  Observations/Objective: Mental status examination done on the phone.  Patient describes his mood euthymic.  His speech is slow, soft with decrease in volume.  His thought process slow but logical.  He denies any auditory or visual hallucination.  He denies any active or passive suicidal thoughts or homicidal thought.  His attention and concentration is fair.  There were no delusions or paranoia.  He is alert and oriented x3.  He reported mild tremors in his hands but it does not interfere in his daily routine.  His fund of knowledge is fair.  His cognition is fair.  His insight and judgment is okay.  Assessment and Plan: Schizophrenia chronic paranoid type.  Tardive dyskinesia.  Patient does not want any medication for his tremors.  He feel the current medicine is working and he does not want to change or reduce the dose.  I will continue Prolixin dec 25 mg intramuscular every 4 weeks.  Discussed medication side effects and benefits.  He is not interested in therapy.  Recommended to call us back if is any question or any concern.  Follow-up in 3 months.  Follow Up Instructions:    I discussed the assessment and treatment plan with the patient. The patient was provided an opportunity to ask questions and all were answered. The patient agreed with the plan and demonstrated an understanding of the instructions.   The patient was advised to call back or seek an in-person evaluation if the symptoms worsen or if the condition fails to improve  as anticipated.  I provided 15 minutes of non-face-to-face time during this encounter.   [name], MD

## 2018-11-14 ENCOUNTER — Other Ambulatory Visit: Payer: Self-pay

## 2018-11-14 ENCOUNTER — Ambulatory Visit (INDEPENDENT_AMBULATORY_CARE_PROVIDER_SITE_OTHER): Payer: Medicare Other

## 2018-11-14 DIAGNOSIS — F200 Paranoid schizophrenia: Secondary | ICD-10-CM | POA: Diagnosis not present

## 2018-11-14 NOTE — Progress Notes (Signed)
Patient presented today with a flat affect and good mood. Patient was more himself today, he has moved to a new apartment and he is happy about this. Patients injection of Prolixin Decanoate 25 mg/mL was prepared as ordered and 1 mL was administered in patientsleft deltoid. Patient will call with any questions or concerns and will return in 2 weeks for his next scheduled injection.

## 2018-11-28 ENCOUNTER — Other Ambulatory Visit: Payer: Self-pay

## 2018-11-28 ENCOUNTER — Ambulatory Visit (INDEPENDENT_AMBULATORY_CARE_PROVIDER_SITE_OTHER): Payer: Medicare Other

## 2018-11-28 DIAGNOSIS — F200 Paranoid schizophrenia: Secondary | ICD-10-CM | POA: Diagnosis not present

## 2018-11-28 NOTE — Progress Notes (Signed)
Patient presented today with a flat affect and good mood. Patient was more himself today, he has moved to a new apartment and he is happy about this. Patients injection of Prolixin Decanoate 25 mg/mL was prepared as ordered my Pam N. and 1 mL was administered in patientsright deltoid. Writer observed the injection. Patient will call with any questions or concerns and will return in 2 weeks for his next scheduled injection.

## 2018-12-12 ENCOUNTER — Ambulatory Visit (INDEPENDENT_AMBULATORY_CARE_PROVIDER_SITE_OTHER): Payer: Medicare Other

## 2018-12-12 ENCOUNTER — Other Ambulatory Visit: Payer: Self-pay

## 2018-12-12 ENCOUNTER — Other Ambulatory Visit (HOSPITAL_COMMUNITY): Payer: Self-pay

## 2018-12-12 DIAGNOSIS — G2401 Drug induced subacute dyskinesia: Secondary | ICD-10-CM

## 2018-12-12 DIAGNOSIS — F200 Paranoid schizophrenia: Secondary | ICD-10-CM

## 2018-12-12 MED ORDER — FLUPHENAZINE DECANOATE 25 MG/ML IJ SOLN: 25.0000 mg | INTRAMUSCULAR | 2 refills | Status: DC

## 2018-12-12 NOTE — Progress Notes (Signed)
Patient presented today with a flat affect and good mood. Patient was more himself today, he has moved to a new apartment and he is happy about this. Patients injection of Prolixin Decanoate 25 mg/mL was prepared as ordered  and 1 mL was administered in patientsrightdeltoid.  Patient will call with any questions or concerns and will return in 2 weeks for his next scheduled injection.

## 2018-12-26 ENCOUNTER — Ambulatory Visit (INDEPENDENT_AMBULATORY_CARE_PROVIDER_SITE_OTHER): Payer: Medicare Other

## 2018-12-26 ENCOUNTER — Other Ambulatory Visit: Payer: Self-pay

## 2018-12-26 DIAGNOSIS — F200 Paranoid schizophrenia: Secondary | ICD-10-CM

## 2018-12-26 NOTE — Progress Notes (Signed)
Patient presented today with a flat affect and good mood. Patient was more himself today, he has moved to a new apartment and he is happy about this. Patients injection of Prolixin Decanoate 25 mg/mL was prepared as orderedand 1 mL was administered in patientsleftdeltoid.Patient will call with any questions or concerns and will return in 2 weeks for his next scheduled injection.

## 2019-01-09 ENCOUNTER — Ambulatory Visit (INDEPENDENT_AMBULATORY_CARE_PROVIDER_SITE_OTHER): Payer: Medicare Other

## 2019-01-09 ENCOUNTER — Other Ambulatory Visit: Payer: Self-pay

## 2019-01-09 DIAGNOSIS — F200 Paranoid schizophrenia: Secondary | ICD-10-CM | POA: Diagnosis not present

## 2019-01-09 NOTE — Progress Notes (Signed)
Patient presented today with a flat affect and good mood. Patient was more himself today, he has moved to a new apartment and he is happy about this. Patients injection of Prolixin Decanoate 25 mg/mL was prepared as orderedand 1 mL was administered inpatientsrightdeltoid.Patient will call with any questions or concerns and will return in 2 weeks for his next scheduled injection.

## 2019-01-13 ENCOUNTER — Ambulatory Visit (HOSPITAL_COMMUNITY)
Admission: EM | Admit: 2019-01-13 | Discharge: 2019-01-13 | Disposition: A | Discharge status: Home / Self Care | Payer: Medicare Other | Attending: Internal Medicine | Admitting: Internal Medicine

## 2019-01-13 ENCOUNTER — Encounter (HOSPITAL_COMMUNITY): Payer: Self-pay | Admitting: Emergency Medicine

## 2019-01-13 ENCOUNTER — Other Ambulatory Visit: Payer: Self-pay

## 2019-01-13 DIAGNOSIS — N390 Urinary tract infection, site not specified: Secondary | ICD-10-CM | POA: Diagnosis not present

## 2019-01-13 DIAGNOSIS — R42 Dizziness and giddiness: Secondary | ICD-10-CM | POA: Diagnosis not present

## 2019-01-13 DIAGNOSIS — R509 Fever, unspecified: Secondary | ICD-10-CM | POA: Diagnosis not present

## 2019-01-13 DIAGNOSIS — F17210 Nicotine dependence, cigarettes, uncomplicated: Secondary | ICD-10-CM

## 2019-01-13 DIAGNOSIS — R197 Diarrhea, unspecified: Secondary | ICD-10-CM

## 2019-01-13 LAB — CBC
HCT: 41.4 % (ref 39.0–52.0)
Hemoglobin: 13.5 g/dL (ref 13.0–17.0)
MCH: 24.1 pg — ABNORMAL LOW (ref 26.0–34.0)
MCHC: 32.6 g/dL (ref 30.0–36.0)
MCV: 73.9 fL — ABNORMAL LOW (ref 80.0–100.0)
Platelets: 120 10*3/uL — ABNORMAL LOW (ref 150–400)
RBC: 5.6 MIL/uL (ref 4.22–5.81)
RDW: 14.3 % (ref 11.5–15.5)
WBC: 11 10*3/uL — ABNORMAL HIGH (ref 4.0–10.5)
nRBC: 0 % (ref 0.0–0.2)

## 2019-01-13 LAB — POCT URINALYSIS DIP (DEVICE)
Bilirubin Urine: NEGATIVE
Glucose, UA: NEGATIVE mg/dL
Ketones, ur: NEGATIVE mg/dL
Nitrite: POSITIVE — AB
Protein, ur: 100 mg/dL — AB
Specific Gravity, Urine: 1.025 (ref 1.005–1.030)
Urobilinogen, UA: 1 mg/dL (ref 0.0–1.0)
pH: 5.5 (ref 5.0–8.0)

## 2019-01-13 LAB — BASIC METABOLIC PANEL
Anion gap: 11 (ref 5–15)
BUN: 19 mg/dL (ref 8–23)
CO2: 21 mmol/L — ABNORMAL LOW (ref 22–32)
Calcium: 8.1 mg/dL — ABNORMAL LOW (ref 8.9–10.3)
Chloride: 99 mmol/L (ref 98–111)
Creatinine, Ser: 1.41 mg/dL — ABNORMAL HIGH (ref 0.61–1.24)
GFR calc Af Amer: >60 mL/min (ref >60)
GFR calc non Af Amer: 52 mL/min — ABNORMAL LOW (ref >60)
Glucose, Bld: 150 mg/dL — ABNORMAL HIGH (ref 70–99)
Potassium: 3.7 mmol/L (ref 3.5–5.1)
Sodium: 131 mmol/L — ABNORMAL LOW (ref 135–145)

## 2019-01-13 MED ORDER — CIPROFLOXACIN HCL 500 MG PO TABS: 500.0000 mg | ORAL_TABLET | Freq: Two times a day (BID) | ORAL | 0 refills | Status: DC

## 2019-01-13 MED ORDER — CEFTRIAXONE SODIUM 1 G IJ SOLR
1.0000 g | Freq: Once | INTRAMUSCULAR | Status: AC
  Administered 2019-01-13: 1 g via INTRAMUSCULAR

## 2019-01-13 MED ORDER — LIDOCAINE HCL (PF) 1 % IJ SOLN
INTRAMUSCULAR | Status: AC
  Filled 2019-01-13: qty 2

## 2019-01-13 MED ORDER — CEFTRIAXONE SODIUM 1 G IJ SOLR
INTRAMUSCULAR | Status: AC
  Filled 2019-01-13: qty 10

## 2019-01-13 NOTE — ED Provider Notes (Signed)
[facility]    CSN: [id number] Arrival date & time: 01/13/19  [place]      History   Chief Complaint Chief Complaint  Patient presents with  . Urinary Tract Infection    HPI Wayne Schmidt is a 63 y.o. male.   Patient presents today with "several days" of dysuria, diarrhea, dizziness, fever (subjective).  He states his symptoms are worse when he is smoking.  He denies shortness of breath, chest pain, nausea, diaphoresis, focal weakness.  He is a poor historian today.  His daughter agrees that he has reported symptoms of dysuria and diarrhea.  The history is provided by the patient and a relative.    Past Medical History:  Diagnosis Date  . Diabetes mellitus without complication ([place])   . High risk sexual behavior 10/2007  . Hypertension   . Microcytosis   . Schizophrenia ([place])   . Substance abuse ([place])    tobacco use    Patient Active Problem List   Diagnosis Date Noted  . Shortness of breath 09/18/2018  . Decreased pulses in feet 01/09/2018  . Fatigue 12/12/2016  . Healthcare maintenance 04/01/2014  . Lumbar radiculopathy 08/04/2013  . 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 MICROCYTOSIS 02/24/2010  . BPH associated with nocturia 03/24/2009  . Paranoid schizophrenia, subchronic condition ([place]) 10/11/2006  . Hyperlipidemia 08/28/2006  . Type 2 diabetes mellitus with other specified complication ([place]) 73/53/2992  . TOBACCO ABUSE 04/27/2006  . Essential hypertension 04/27/2006    Past Surgical History:  Procedure Laterality Date  . LAMINECTOMY     by Dr Rolin Barry       Home Medications    Prior to Admission medications   Medication Sig Start Date End Date Taking? Authorizing Provider  doxazosin (CARDURA) 4 MG tablet TAKE (1) TABLET BY MOUTH ONCE DAILY. 05/09/18   Annia Belt, MD  fluPHENAZine decanoate (PROLIXIN) 25 MG/ML injection Inject 1 mL (25 mg total) into the muscle every 14 (fourteen) days. 12/12/18   Arfeen, Arlyce Harman, MD  gabapentin  (NEURONTIN) 300 MG capsule  07/18/18   [provider]  hydroxypropyl methylcellulose / hypromellose (ISOPTO TEARS / GONIOVISC) 2.5 % ophthalmic solution Place 2 drops into both eyes daily as needed for dry eyes.    [provider]  losartan (COZAAR) 100 MG tablet TAKE (1) TABLET BY MOUTH ONCE DAILY. 05/09/18   Annia Belt, MD  pravastatin (PRAVACHOL) 40 MG tablet Take 1 tablet (40 mg total) by mouth daily. 06/18/18   Oval Linsey, MD  predniSONE (DELTASONE) 20 MG tablet Take 2 tablets (40 mg total) by mouth daily with breakfast. 09/18/18   Seawell, Laurell Roof, DO    Family History Family History  Problem Relation Age of Onset  . Diabetes Mother   . Hypertension Mother   . Lupus Sister   . Sickle cell anemia Brother   . Drug abuse Brother     Social History Social History   Tobacco Use  . Smoking status: Current Every Day Smoker    Packs/day: 0.50    Years: 38.00    Pack years: 19.00    Types: Cigarettes  . Smokeless tobacco: Never Used  . Tobacco comment: Has cut back to 1/2 pack a day but not ready to quit  Substance Use Topics  . Alcohol use: Yes    Alcohol/week: 0.0 standard drinks    Frequency: Never    Comment: Occasional a 16 oz beer once a month   . Drug use: No  Comment: h/o remote cocaine use-denies present use     Allergies   Ace inhibitors, Atorvastatin, Chlorpromazine hcl, and Simvastatin   Review of Systems Review of Systems  Constitutional: Positive for fever. Negative for chills.  HENT: Negative for ear pain and sore throat.   Eyes: Negative for pain and visual disturbance.  Respiratory: Negative for cough and shortness of breath.   Cardiovascular: Negative for chest pain and palpitations.  Gastrointestinal: Positive for diarrhea. Negative for abdominal pain and vomiting.  Genitourinary: Positive for dysuria. Negative for flank pain and hematuria.  Musculoskeletal: Negative for arthralgias and back pain.  Skin: Negative  for color change and rash.  Neurological: Positive for dizziness. Negative for seizures, syncope, facial asymmetry, speech difficulty, weakness, light-headedness, numbness and headaches.  All other systems reviewed and are negative.    Physical Exam Triage Vital Signs ED Triage Vitals  Enc Vitals Group     BP 01/13/19 1758 128/75     Pulse Rate 01/13/19 1758 94     Resp 01/13/19 1758 20     Temp 01/13/19 1758 99.6 F (37.6 C)     Temp Source 01/13/19 1758 Oral     SpO2 01/13/19 1758 100 %     Weight --      Height --      Head Circumference --      Peak Flow --      Pain Score 01/13/19 1755 6     Pain Loc --      Pain Edu? --      Excl. in [place]? --    No data found.  Updated Vital Signs BP 128/75 (BP Location: Left Arm)   Pulse 94   Temp 99.6 F (37.6 C) (Oral)   Resp 20   SpO2 100%   Visual Acuity Right Eye Distance:   Left Eye Distance:   Bilateral Distance:    Right Eye Near:   Left Eye Near:    Bilateral Near:     Physical Exam Vitals signs and nursing note reviewed.  Constitutional:      Appearance: He is well-developed.  HENT:     Head: Normocephalic and atraumatic.  Eyes:     Conjunctiva/sclera: Conjunctivae normal.  Neck:     Musculoskeletal: Neck supple.  Cardiovascular:     Rate and Rhythm: Normal rate and regular rhythm.     Heart sounds: Normal heart sounds.  Pulmonary:     Effort: Pulmonary effort is normal. No respiratory distress.     Breath sounds: Normal breath sounds.  Abdominal:     Palpations: Abdomen is soft.     Tenderness: There is no abdominal tenderness. There is no right CVA tenderness, left CVA tenderness, guarding or rebound.  Musculoskeletal:     Right lower leg: No edema.     Left lower leg: No edema.  Skin:    General: Skin is warm and dry.  Neurological:     General: No focal deficit present.     Mental Status: He is alert.     Sensory: No sensory deficit.     Motor: No weakness.     Coordination: Coordination  normal.     Gait: Gait normal.     Deep Tendon Reflexes: Reflexes normal.      UC Treatments / Results  Labs (all labs ordered are listed, but only abnormal results are displayed) Labs Reviewed - No data to display  EKG   Radiology No results found.  Procedures Procedures (including critical care  time)  Medications Ordered in UC Medications - No data to display  Initial Impression / Assessment and Plan / UC Course  I have reviewed the triage vital signs and the nursing notes.  Pertinent labs & imaging results that were available during my care of the patient were reviewed by me and considered in my medical decision making (see chart for details).   Urinary tract infection. Dizziness.  EKG unchanged from 2018.  Given injection of Rocephin here.  Start Cipro tomorrow.  Follow-up with primary care provider in 1 week.  Instructed patient and his daughter to go to the emergency department if he experiences shortness of breath, chest pain, weakness, worsening dizziness, fever, chills, decreased urine output, other concerning symptoms.  Discussed case and plan of care with Dr. Lanny Cramp; he agrees.     Final Clinical Impressions(s) / UC Diagnoses   Final diagnoses:  None   Discharge Instructions   None    ED Prescriptions    None     Controlled Substance [facility] [place] Controlled Substance Registry consulted? Not Applicable   Sharion Balloon, NP 01/13/19 [phone]

## 2019-01-13 NOTE — ED Triage Notes (Signed)
Pain with urination and poor urinary stream for "quite a while".  Patient says he feels feverish

## 2019-01-13 NOTE — Discharge Instructions (Signed)
You have a urinary tract infection.  You were given an injection of an antibiotic called Rocephin this evening.  Start taking the antibiotic Cipro tomorrow as prescribed.  Follow-up with your primary care provider in 1 week.    Go to the emergency department if you experience shortness of breath, chest pain, weakness, worsening dizziness, fever, chills, decreased urine, or other concerning symptoms.

## 2019-01-21 ENCOUNTER — Other Ambulatory Visit: Payer: Self-pay

## 2019-01-21 ENCOUNTER — Ambulatory Visit (INDEPENDENT_AMBULATORY_CARE_PROVIDER_SITE_OTHER): Payer: Medicare Other | Admitting: Internal Medicine

## 2019-01-21 VITALS — BP 113/76 | HR 100 | Temp 98.4°F | Ht 72.0 in | Wt 231.9 lb

## 2019-01-21 DIAGNOSIS — Z87440 Personal history of urinary (tract) infections: Secondary | ICD-10-CM

## 2019-01-21 DIAGNOSIS — R0602 Shortness of breath: Secondary | ICD-10-CM

## 2019-01-21 DIAGNOSIS — E785 Hyperlipidemia, unspecified: Secondary | ICD-10-CM

## 2019-01-21 DIAGNOSIS — M5416 Radiculopathy, lumbar region: Secondary | ICD-10-CM

## 2019-01-21 DIAGNOSIS — I10 Essential (primary) hypertension: Secondary | ICD-10-CM | POA: Diagnosis not present

## 2019-01-21 DIAGNOSIS — E1169 Type 2 diabetes mellitus with other specified complication: Secondary | ICD-10-CM

## 2019-01-21 DIAGNOSIS — N3000 Acute cystitis without hematuria: Secondary | ICD-10-CM

## 2019-01-21 DIAGNOSIS — I951 Orthostatic hypotension: Secondary | ICD-10-CM | POA: Diagnosis not present

## 2019-01-21 DIAGNOSIS — F200 Paranoid schizophrenia: Secondary | ICD-10-CM

## 2019-01-21 DIAGNOSIS — Z79899 Other long term (current) drug therapy: Secondary | ICD-10-CM

## 2019-01-21 DIAGNOSIS — N401 Enlarged prostate with lower urinary tract symptoms: Secondary | ICD-10-CM

## 2019-01-21 DIAGNOSIS — R351 Nocturia: Secondary | ICD-10-CM

## 2019-01-21 LAB — POCT GLYCOSYLATED HEMOGLOBIN (HGB A1C): Hemoglobin A1C: 7.3 % — AB (ref 4.0–5.6)

## 2019-01-21 LAB — GLUCOSE, CAPILLARY: Glucose-Capillary: 163 mg/dL — ABNORMAL HIGH (ref 70–99)

## 2019-01-21 MED ORDER — DOXAZOSIN MESYLATE 2 MG PO TABS: 2.0000 mg | ORAL_TABLET | Freq: Every day | ORAL | 0 refills | Status: DC

## 2019-01-21 NOTE — Patient Instructions (Signed)
-  It was a pleasure seeing you today -Your A1c which is a measure of your diabetes is elevated (more than 7) today.  Please continue to work on her diet and exercise.  If it remains over 7 on your follow-up visit we will start you on Jardiance again -I have reduced your doxazosin from 4 mg daily to 2 mg daily as it may be causing some of your dizziness -We will check some blood work on you today -We will also check a urine test on you today to make sure that the urine infection has improved -Please call me if you have any questions or concerns

## 2019-01-21 NOTE — Assessment & Plan Note (Signed)
-  This problem is chronic and stable -Patient follows up with psychiatry as an outpatient for this -Continue with fluphenazine per psychiatry -Patient states that his mood and affect have been stable on the current dose -No further work-up at this time

## 2019-01-21 NOTE — Assessment & Plan Note (Signed)
Lab Results  Component Value Date   HGBA1C 7.3 (A) 01/21/2019   HGBA1C 6.8 (A) 07/09/2018   HGBA1C 7.1 (A) 02/26/2018     Assessment: Diabetes control:  Fair Progress toward A1C goal:   Deteriorated Comments: Patient is diet controlled and not on any medications currently.  He was previously on Jardiance.  He does not want to start a new medication at this time  Plan: Medications:  No medications currently Home glucose monitoring: Frequency:   Timing:   Instruction/counseling given: discussed the need for weight loss Educational resources provided:   Self management tools provided:   Other plans: Patient to attempt weight loss and diet control over the next 3 months and we will repeat his A1c then.  If his A1c remains elevated over 7 we will resume his [place]

## 2019-01-21 NOTE — Assessment & Plan Note (Addendum)
-  Patient was recently treated for urinary tract infection at urgent care -At that time patient noted to have mild leukocytosis, dysuria and a UA consistent with a urinary tract infection -He was given ceftriaxone x1 dose and then [place] home on ciprofloxacin -Patient completed his last dose of ciprofloxacin yesterday -Patient still complains of some mild dysuria but overall feels much better -We will check a repeat UA and urine culture today given his mild dysuria -Patient is also noted to have a creatinine of 1.4 when evaluated in urgent care likely secondary to dehydration in the setting of urinary tract infection -We will recheck a BMP today

## 2019-01-21 NOTE — Assessment & Plan Note (Signed)
BP Readings from Last 3 Encounters:  01/21/19 113/76  01/13/19 128/75  09/18/18 128/71    Lab Results  Component Value Date   NA 131 (L) 01/13/2019   K 3.7 01/13/2019   CREATININE 1.41 (H) 01/13/2019    Assessment: Blood pressure control:  Well controlled Progress toward BP goal:   At goal Comments: Patient is compliant with losartan 100 mg daily as well as doxazosin 4 mg daily.  He did complain of some lightheadedness and was noted to be orthostatic by blood pressure on exam today  Plan: Medications:  We will reduce his doxazosin to 2 mg daily and continue with losartan 100 mg daily Educational resources provided:   Self management tools provided:   Other plans: We will check BMP today

## 2019-01-21 NOTE — Assessment & Plan Note (Signed)
-  This problem is chronic and stable -Patient used to be on Flomax but has been off this medication over the last 6 months -He states that his urinary symptoms are usually well controlled on doxazosin alone -Given the patient's orthostatic hypotension and his complaints of occasional lightheadedness on standing I will decrease his doxazosin to 2 mg from 4 mg -If he has worsening urinary symptoms he will follow-up with his urologist as an outpatient

## 2019-01-21 NOTE — Assessment & Plan Note (Signed)
-  This problem is chronic and stable -Continue pravastatin 40 mg daily -We will check lipid panel today -No further work-up at this time

## 2019-01-21 NOTE — Assessment & Plan Note (Signed)
-  Patient denies any shortness of breath currently -He did receive a course of prednisone for worsening shortness of breath back in March -He will need PFTs as an outpatient -We will address this with him on his follow-up visit

## 2019-01-21 NOTE — Progress Notes (Signed)
   Subjective:    Patient ID: Wayne Schmidt, male    DOB: [birth year], 63 y.o.   MRN: [id number]  HPI  I have seen and examined this patient.  Patient is here for routine follow-up of his diabetes and hypertension.  Patient was seen in the ED recently for urinary tract infection and was prescribed ciprofloxacin for this.  He states that he still has some mild dysuria but overall feels much better.  He does complain of some intermittent dizziness on standing and is orthostatic by blood pressure today. He denies any other complaints at this time and states that he feels well overall and is compliant with all of his medications.  Review of Systems  Constitutional: Negative.   HENT: Negative.   Respiratory: Negative.   Cardiovascular: Negative.   Gastrointestinal: Negative.   Musculoskeletal: Negative.   Neurological: Positive for dizziness.  Psychiatric/Behavioral: Negative.        Objective:   Physical Exam Constitutional:      Appearance: Normal appearance.  HENT:     Head: Normocephalic and atraumatic.  Neck:     Musculoskeletal: Neck supple.  Cardiovascular:     Rate and Rhythm: Normal rate and regular rhythm.     Heart sounds: Normal heart sounds.  Pulmonary:     Effort: Pulmonary effort is normal.     Breath sounds: Normal breath sounds. No wheezing or rales.  Abdominal:     General: Abdomen is flat. Bowel sounds are normal. There is no distension.     Palpations: Abdomen is soft.     Tenderness: There is no abdominal tenderness.  Musculoskeletal: Normal range of motion.        General: No swelling or tenderness.  Lymphadenopathy:     Cervical: No cervical adenopathy.  Skin:    General: Skin is warm and dry.  Neurological:     General: No focal deficit present.     Mental Status: He is alert and oriented to person, place, and time.  Psychiatric:        Mood and Affect: Mood normal.        Behavior: Behavior normal.           Assessment & Plan:  Please see  problem based charting for assessment and plan:

## 2019-01-21 NOTE — Assessment & Plan Note (Signed)
-  Patient states that his back pain has resolved -He has not been taking the gabapentin at home as he states that he does not need this medication anymore -We will remove this from his medication list -No further work-up at this time

## 2019-01-22 ENCOUNTER — Telehealth: Payer: Self-pay | Admitting: Internal Medicine

## 2019-01-22 LAB — BMP8+ANION GAP
Anion Gap: 17 mmol/L (ref 10.0–18.0)
BUN/Creatinine Ratio: 10 (ref 10–24)
BUN: 12 mg/dL (ref 8–27)
CO2: 21 mmol/L (ref 20–29)
Calcium: 9.1 mg/dL (ref 8.6–10.2)
Chloride: 101 mmol/L (ref 96–106)
Creatinine, Ser: 1.18 mg/dL (ref 0.76–1.27)
GFR calc Af Amer: 75 mL/min/{1.73_m2} (ref >59)
GFR calc non Af Amer: 65 mL/min/{1.73_m2} (ref >59)
Glucose: 155 mg/dL — ABNORMAL HIGH (ref 65–99)
Potassium: 4.7 mmol/L (ref 3.5–5.2)
Sodium: 139 mmol/L (ref 134–144)

## 2019-01-22 LAB — URINALYSIS, ROUTINE W REFLEX MICROSCOPIC
Bilirubin, UA: NEGATIVE
Glucose, UA: NEGATIVE
Ketones, UA: NEGATIVE
Nitrite, UA: NEGATIVE
Protein,UA: NEGATIVE
RBC, UA: NEGATIVE
Specific Gravity, UA: 1.014 (ref 1.005–1.030)
Urobilinogen, Ur: 0.2 mg/dL (ref 0.2–1.0)
pH, UA: 5 (ref 5.0–7.5)

## 2019-01-22 LAB — LIPID PANEL
Chol/HDL Ratio: 3 ratio (ref 0.0–5.0)
Cholesterol, Total: 128 mg/dL (ref 100–199)
HDL: 43 mg/dL (ref >39)
LDL Calculated: 71 mg/dL (ref 0–99)
Triglycerides: 71 mg/dL (ref 0–149)
VLDL Cholesterol Cal: 14 mg/dL (ref 5–40)

## 2019-01-22 LAB — MICROSCOPIC EXAMINATION: Casts: NONE SEEN /lpf

## 2019-01-22 NOTE — Telephone Encounter (Signed)
I called the patient to discuss the results of his blood work and UA with him.  I explained to the patient that his creatinine is now normalized and that his BMP otherwise is within normal limits except for mildly elevated glucose.  Patient's cholesterol panel was also within normal limits.  Patient's UA is much improved from 9 days ago.  No further work-up required at this time.  Patient expresses understanding and is in agreement with plan.

## 2019-01-23 ENCOUNTER — Ambulatory Visit (INDEPENDENT_AMBULATORY_CARE_PROVIDER_SITE_OTHER): Payer: Medicare Other

## 2019-01-23 ENCOUNTER — Other Ambulatory Visit: Payer: Self-pay

## 2019-01-23 DIAGNOSIS — F200 Paranoid schizophrenia: Secondary | ICD-10-CM | POA: Diagnosis not present

## 2019-01-23 LAB — URINE CULTURE: Organism ID, Bacteria: NO GROWTH

## 2019-01-23 MED ORDER — FLUPHENAZINE DECANOATE 25 MG/ML IJ SOLN
25.0000 mg | INTRAMUSCULAR | Status: AC
  Administered 2019-01-23 – 2019-11-26 (×24): 25 mg via INTRAMUSCULAR

## 2019-01-23 NOTE — Progress Notes (Signed)
Patient presented today with a flat affect and good mood. Patient feeling good, he is staying inside a lot due to heat and COVID, but he is doing well. Patients injection of Prolixin Decanoate 25 mg/mL was prepared as orderedand 1 mL was administered inpatientsleftdeltoid.Patient will call with any questions or concerns and will return in 2 weeks for his next scheduled injection.

## 2019-02-06 ENCOUNTER — Ambulatory Visit (INDEPENDENT_AMBULATORY_CARE_PROVIDER_SITE_OTHER): Payer: Medicare Other

## 2019-02-06 ENCOUNTER — Other Ambulatory Visit: Payer: Self-pay

## 2019-02-06 DIAGNOSIS — F200 Paranoid schizophrenia: Secondary | ICD-10-CM

## 2019-02-06 NOTE — Progress Notes (Signed)
Patient presented today with a flat affect and good mood. Patient feeling good, he is staying inside a lot due to heat and COVID, but he is doing well. Patients injection of Prolixin Decanoate 25 mg/mL was prepared as orderedand 1 mL was administered inpatientsrightdeltoid.Patient will call with any questions or concerns and will return in 2 weeks for his next scheduled injection.

## 2019-02-13 ENCOUNTER — Ambulatory Visit (INDEPENDENT_AMBULATORY_CARE_PROVIDER_SITE_OTHER): Payer: Medicare Other | Admitting: Psychiatry

## 2019-02-13 ENCOUNTER — Encounter (HOSPITAL_COMMUNITY): Payer: Self-pay | Admitting: Psychiatry

## 2019-02-13 ENCOUNTER — Other Ambulatory Visit: Payer: Self-pay

## 2019-02-13 DIAGNOSIS — F200 Paranoid schizophrenia: Secondary | ICD-10-CM | POA: Diagnosis not present

## 2019-02-13 DIAGNOSIS — G2401 Drug induced subacute dyskinesia: Secondary | ICD-10-CM | POA: Diagnosis not present

## 2019-02-13 MED ORDER — FLUPHENAZINE DECANOATE 25 MG/ML IJ SOLN: 25.0000 mg | INTRAMUSCULAR | 2 refills | Status: DC

## 2019-02-13 NOTE — Progress Notes (Signed)
Virtual Visit via Telephone Note  I connected with Wayne Schmidt on 02/13/19 at 11:00 AM EDT by telephone and verified that I am speaking with the correct person using two identifiers.   I discussed the limitations, risks, security and privacy concerns of performing an evaluation and management service by telephone and the availability of in person appointments. I also discussed with the patient that there may be a patient responsible charge related to this service. The patient expressed understanding and agreed to proceed.   History of Present Illness: Patient was evaluated by phone session.  Recently he was seen in the emergency room because of UTI.  He is having episodes of UTI recently.  He still wakes up in the middle of the night for the bathroom.  He has blood work and his hemoglobin A1c is slightly high from the past.  He admitted sometimes not able to keep his appetite under control.  He is getting Prolixin injection every 4 weeks which is helping his mood and paranoia.  His tremors are stable and does not interfere in his daily routine.  He does not want to take any medicine for tremors.  Denies any irritability, anger or any mania.  He is going to the program few days a week which appears to be a psychosocial rehabilitation but do not remember the name of the program very well.  He also had a good support from his neighbors.  He lives by himself however his daughter is very involved and he see her almost every day.  Patient wants to continue his Prolixin since it is helping his mood and paranoia.  Denies drinking or using any illegal substances.  He try to keep himself busy and walks around apartment complex every day and try to help and talk to the neighbors.  He admitted taking precautions due to COVID-19.    Past Psychiatric History:Reviewed. H/Oschizophreniawithhis 16-20 times inpatient treatment. History of hallucination, paranoia and suicidal attempt by taking overdose. [facility]  admission at [facility]. TookHaldol and Thorazine buthad side effects.OnProlixin injection for past 30 years.  Tried hydroxyzine but could not tolerate.  Recent Results (from the past 2160 hour(s))  POCT urinalysis dip (device)     Status: Abnormal   Collection Time: 01/13/19  6:09 PM  Result Value Ref Range   Glucose, UA NEGATIVE NEGATIVE mg/dL   Bilirubin Urine NEGATIVE NEGATIVE   Ketones, ur NEGATIVE NEGATIVE mg/dL   Specific Gravity, Urine 1.025 1.005 - 1.030   Hgb urine dipstick MODERATE (A) NEGATIVE   pH 5.5 5.0 - 8.0   Protein, ur 100 (A) NEGATIVE mg/dL   Urobilinogen, UA 1.0 0.0 - 1.0 mg/dL   Nitrite POSITIVE (A) NEGATIVE   Leukocytes,Ua SMALL (A) NEGATIVE    Comment: Biochemical Testing Only. Please order routine urinalysis from main lab if confirmatory testing is needed.  Basic metabolic panel     Status: Abnormal   Collection Time: 01/13/19  6:23 PM  Result Value Ref Range   Sodium 131 (L) 135 - 145 mmol/L   Potassium 3.7 3.5 - 5.1 mmol/L   Chloride 99 98 - 111 mmol/L   CO2 21 (L) 22 - 32 mmol/L   Glucose, Bld 150 (H) 70 - 99 mg/dL   BUN 19 8 - 23 mg/dL   Creatinine, Ser 1.41 (H) 0.61 - 1.24 mg/dL   Calcium 8.1 (L) 8.9 - 10.3 mg/dL   GFR calc non Af Amer 52 (L) >60 mL/min   GFR calc Af Amer >60 >60 mL/min  Anion gap 11 5 - 15    Comment: Performed at [facility] [address], [place], [place] [address]  CBC     Status: Abnormal   Collection Time: 01/13/19  6:52 PM  Result Value Ref Range   WBC 11.0 (H) 4.0 - 10.5 K/uL   RBC 5.60 4.22 - 5.81 MIL/uL   Hemoglobin 13.5 13.0 - 17.0 g/dL   HCT 41.4 39.0 - 52.0 %   MCV 73.9 (L) 80.0 - 100.0 fL   MCH 24.1 (L) 26.0 - 34.0 pg   MCHC 32.6 30.0 - 36.0 g/dL   RDW 14.3 11.5 - 15.5 %   Platelets 120 (L) 150 - 400 K/uL   nRBC 0.0 0.0 - 0.2 %    Comment: Performed at [facility], [place] [address] [place], [place] [address]  Glucose, capillary     Status: Abnormal   Collection Time: 01/21/19 10:33 AM   Result Value Ref Range   Glucose-Capillary 163 (H) 70 - 99 mg/dL  POC Hbg A1C     Status: Abnormal   Collection Time: 01/21/19 10:42 AM  Result Value Ref Range   Hemoglobin A1C 7.3 (A) 4.0 - 5.6 %   HbA1c POC (<> result, manual entry)     HbA1c, POC (prediabetic range)     HbA1c, POC (controlled diabetic range)    Urinalysis, Reflex Microscopic     Status: Abnormal   Collection Time: 01/21/19 11:29 AM  Result Value Ref Range   Specific Gravity, UA 1.014 1.005 - 1.030   pH, UA 5.0 5.0 - 7.5   Color, UA Yellow Yellow   Appearance Ur Clear Clear   Leukocytes,UA Trace (A) Negative   Protein,UA Negative Negative/Trace   Glucose, UA Negative Negative   Ketones, UA Negative Negative   RBC, UA Negative Negative   Bilirubin, UA Negative Negative   Urobilinogen, Ur 0.2 0.2 - 1.0 mg/dL   Nitrite, UA Negative Negative   Microscopic Examination See below:     Comment: Microscopic was indicated and was performed.  Culture, Urine     Status: None   Collection Time: 01/21/19 11:29 AM   Specimen: Urine   URINE  Result Value Ref Range   Urine Culture, Routine Final report    Organism ID, Bacteria No growth   Microscopic Examination     Status: None   Collection Time: 01/21/19 11:29 AM   URINE  Result Value Ref Range   WBC, UA 0-5 0 - 5 /hpf   RBC 0-2 0 - 2 /hpf   Epithelial Cells (non renal) 0-10 0 - 10 /hpf   Casts None seen None seen /lpf   Mucus, UA Present Not Estab.   Bacteria, UA Few None seen/Few  BMP8+Anion Gap     Status: Abnormal   Collection Time: 01/21/19 11:39 AM  Result Value Ref Range   Glucose 155 (H) 65 - 99 mg/dL   BUN 12 8 - 27 mg/dL   Creatinine, Ser 1.18 0.76 - 1.27 mg/dL   GFR calc non Af Amer 65 >59 mL/min/1.73   GFR calc Af Amer 75 >59 mL/min/1.73   BUN/Creatinine Ratio 10 10 - 24   Sodium 139 134 - 144 mmol/L   Potassium 4.7 3.5 - 5.2 mmol/L   Chloride 101 96 - 106 mmol/L   CO2 21 20 - 29 mmol/L   Anion Gap 17.0 10.0 - 18.0 mmol/L   Calcium 9.1 8.6 -  10.2 mg/dL  Lipid Profile  Status: None   Collection Time: 01/21/19 11:39 AM  Result Value Ref Range   Cholesterol, Total 128 100 - 199 mg/dL   Triglycerides 71 0 - 149 mg/dL   HDL 43 >39 mg/dL   VLDL Cholesterol Cal 14 5 - 40 mg/dL   LDL Calculated 71 0 - 99 mg/dL   Chol/HDL Ratio 3.0 0.0 - 5.0 ratio    Comment:                                   T. Chol/HDL Ratio                                             Men  Women                               1/2 Avg.Risk  3.4    3.3                                   Avg.Risk  5.0    4.4                                2X Avg.Risk  9.6    7.1                                3X Avg.Risk 23.4   11.0       Psychiatric Specialty Exam: Physical Exam  ROS  There were no vitals taken for this visit.There is no height or weight on file to calculate BMI.  General Appearance: NA  Eye Contact:  NA  Speech:  Slow  Volume:  Decreased  Mood:  Euthymic  Affect:  NA  Thought Process:  Descriptions of Associations: Circumstantial  Orientation:  Full (Time, Place, and Person)  Thought Content:  Paranoid Ideation  Suicidal Thoughts:  No  Homicidal Thoughts:  No  Memory:  Immediate;   Fair Recent;   Fair Remote;   Fair  Judgement:  Good  Insight:  Present  Psychomotor Activity:  NA  Concentration:  Concentration: Fair and Attention Span: Fair  Recall:  [employer] of Knowledge:  Fair  Language:  Good  Akathisia:  Tardy dyskinesia  Handed:  Right  AIMS (if indicated):     Assets:  Communication Skills Desire for Improvement Housing Social Support  ADL's:  Intact  Cognition:  WNL  Sleep:   ok      Assessment and Plan: Schizophrenia chronic paranoid type.  Tardive dyskinesia.  Patient is a stable on his current medication.  I reviewed his blood work results.  His hemoglobin A1c is slightly high.  Encouraged to watch his calorie intake and check his blood sugar regularly.  He does not want to take any medication to help his tremors.   Patient feels it does not interfere in his daily routine.  His paranoia is under control.  I will continue Prolixin injection 25 mg intramuscular every 4 weeks.  He is not interested in therapy.  Recommended to call us back if is any question or  any concern.  Follow-up in 3 months.  Follow Up Instructions:    I discussed the assessment and treatment plan with the patient. The patient was provided an opportunity to ask questions and all were answered. The patient agreed with the plan and demonstrated an understanding of the instructions.   The patient was advised to call back or seek an in-person evaluation if the symptoms worsen or if the condition fails to improve as anticipated.  I provided 20 minutes of non-face-to-face time during this encounter.   [name], MD

## 2019-02-20 ENCOUNTER — Ambulatory Visit (INDEPENDENT_AMBULATORY_CARE_PROVIDER_SITE_OTHER): Payer: Medicare Other

## 2019-02-20 ENCOUNTER — Other Ambulatory Visit: Payer: Self-pay

## 2019-02-20 DIAGNOSIS — F200 Paranoid schizophrenia: Secondary | ICD-10-CM

## 2019-02-20 NOTE — Progress Notes (Signed)
Patient presented today with a flat affect and good mood.Patient feeling good, he is staying inside a lot due to heat and COVID, but he is doing well.Patients injection of Prolixin Decanoate 25 mg/mL was prepared as orderedand 1 mL was administered inpatients left deltoid.Patient will call with any questions or concerns and will return in 2 weeks for his next scheduled injection.

## 2019-03-06 ENCOUNTER — Ambulatory Visit (INDEPENDENT_AMBULATORY_CARE_PROVIDER_SITE_OTHER): Payer: Medicare Other

## 2019-03-06 ENCOUNTER — Other Ambulatory Visit: Payer: Self-pay

## 2019-03-06 DIAGNOSIS — F200 Paranoid schizophrenia: Secondary | ICD-10-CM | POA: Diagnosis not present

## 2019-03-07 NOTE — Progress Notes (Signed)
Patient presented today with a flat affect and good mood. Patient feeling good, he is staying inside a lot due to heat and COVID, but he is doing well. Patients injection of Prolixin Decanoate 25 mg/mL was prepared as orderedand 1 mL was administered inpatientsrightdeltoid.Patient will call with any questions or concerns and will return in 2 weeks for his next scheduled injection. 

## 2019-03-20 ENCOUNTER — Other Ambulatory Visit: Payer: Self-pay

## 2019-03-20 ENCOUNTER — Ambulatory Visit (INDEPENDENT_AMBULATORY_CARE_PROVIDER_SITE_OTHER): Payer: Medicare Other

## 2019-03-20 DIAGNOSIS — F200 Paranoid schizophrenia: Secondary | ICD-10-CM | POA: Diagnosis not present

## 2019-03-20 NOTE — Progress Notes (Signed)
Patient presented today with a flat affect and good mood.Patient feeling good, he is staying inside a lot due to heat and COVID, but he is doing well.Patients injection of Prolixin Decanoate 25 mg/mL was prepared as orderedand 1 mL was administered inpatients left deltoid.Patient will call with any questions or concerns and will return in 2 weeks for his next scheduled injection. 

## 2019-04-02 IMAGING — MR MR LUMBAR SPINE W/O CM
4 of 5 series · 24 of 48 positions shown · non-contrast
Comparison: MRI lumbar spine dated September 18, 2014.

CLINICAL DATA: Left-sided low back pain with left leg pain,
weakness, and numbness for the past 8 months.

EXAM:
MRI LUMBAR SPINE WITHOUT CONTRAST
TECHNIQUE: Multiplanar, multisequence MR imaging of the lumbar spine was
performed. No intravenous contrast was administered.

[Series 4: T1 · sagittal · 4.0mm · 0.55mm/px · 6 of 15 slices shown (1 of 2)]
[im 1/15]
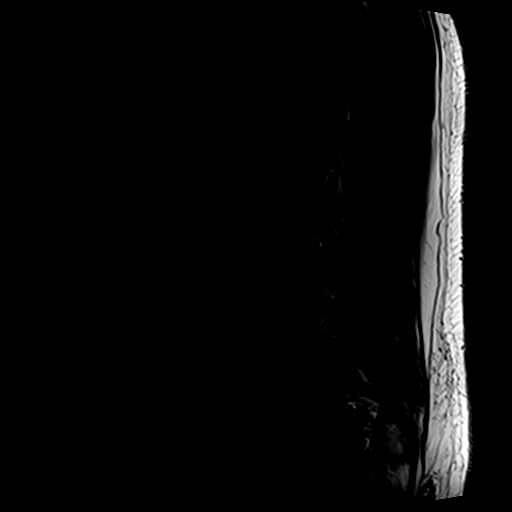
[im 3/15]
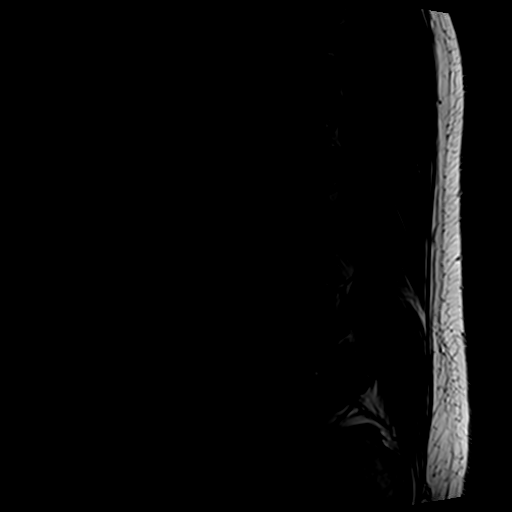
[im 6/15]
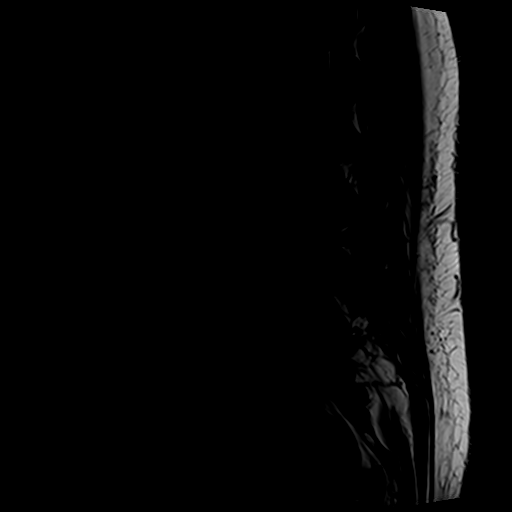
[im 9/15]
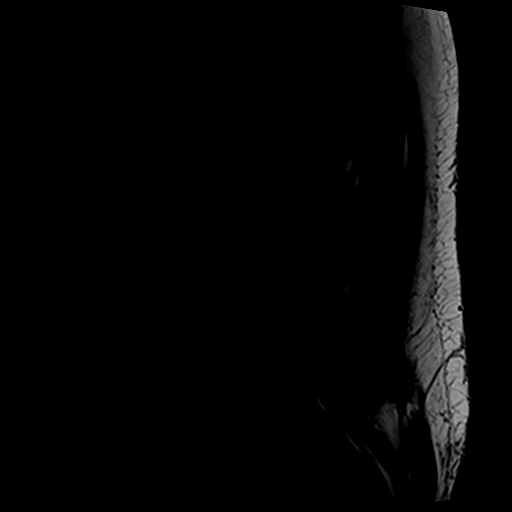
[im 12/15]
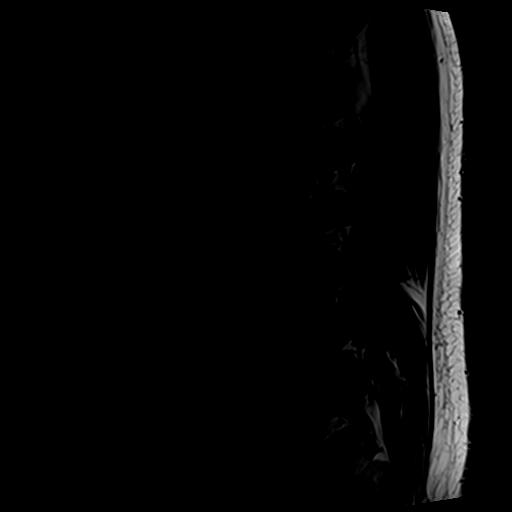
[im 15/15]
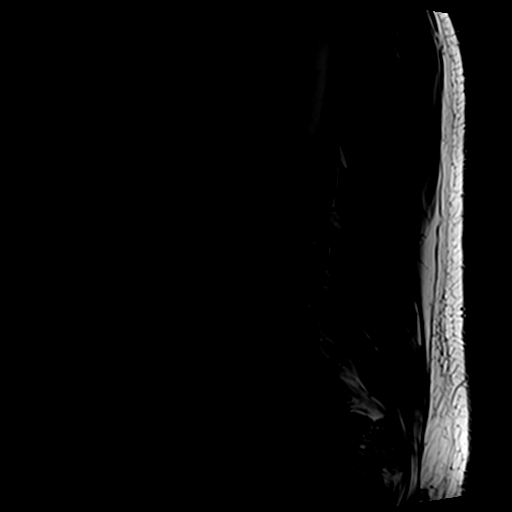

[Series 5: T2 post-contrast · sagittal · 4.0mm · 0.55mm/px · 6 of 15 slices shown]
[im 1/15]
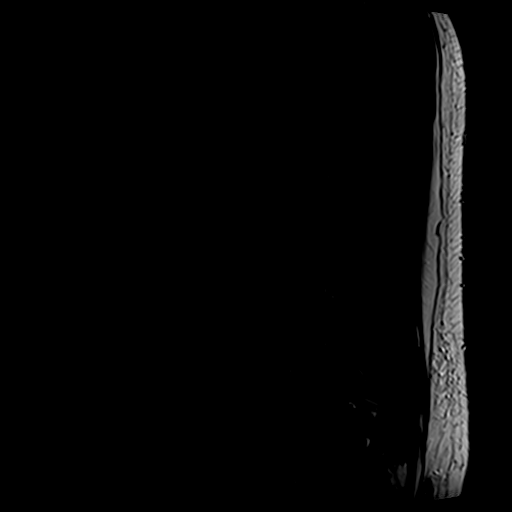
[im 3/15]
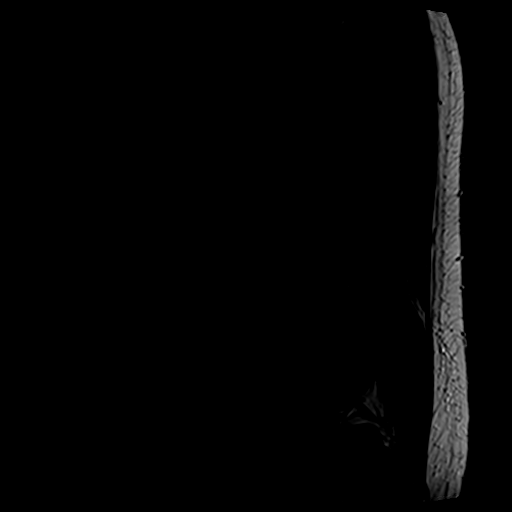
[im 6/15]
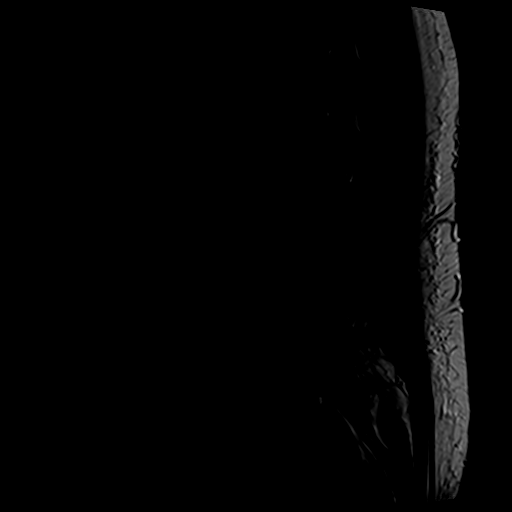
[im 9/15]
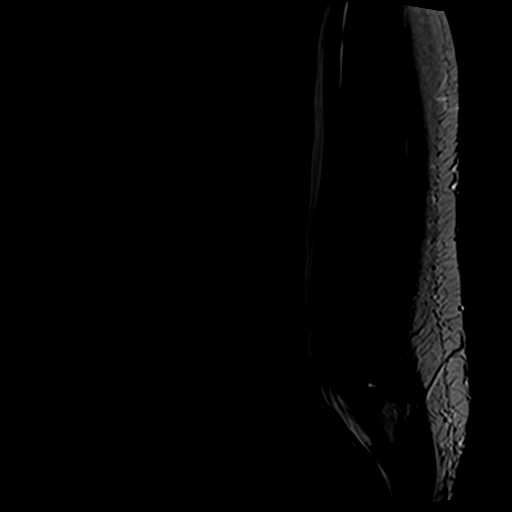
[im 12/15]
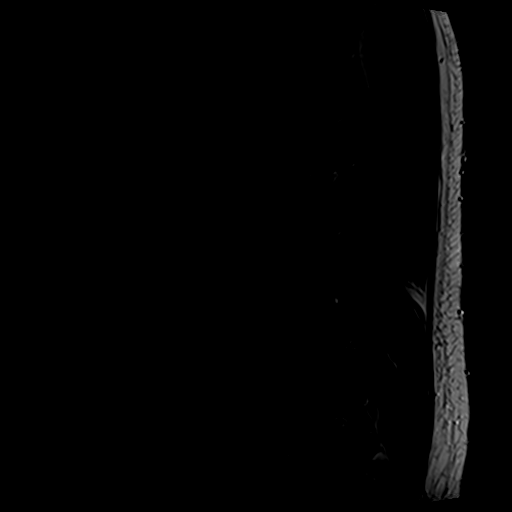
[im 15/15]
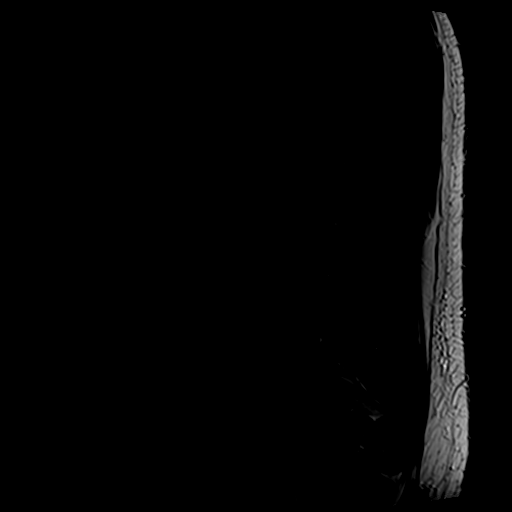

[Series 6: T2 · axial · 4.0mm · 0.70mm/px · z∈[-10,+201]mm · 9 of 40 slices shown]
[im 1/40]
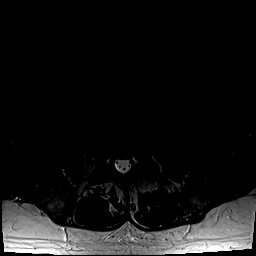
[im 6/40]
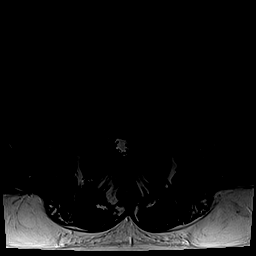
[im 12/40]
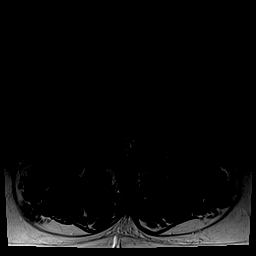
[im 17/40]
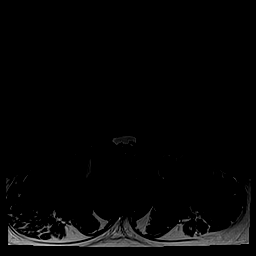
[im 20/40]
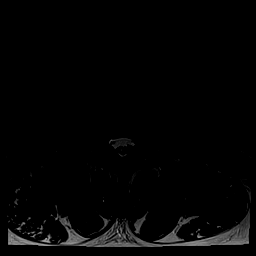
[im 23/40]
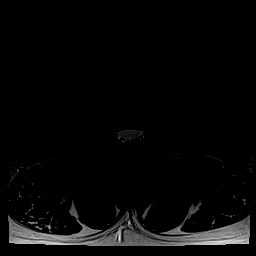
[im 28/40]
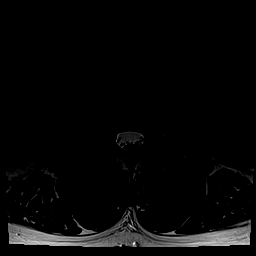
[im 34/40]
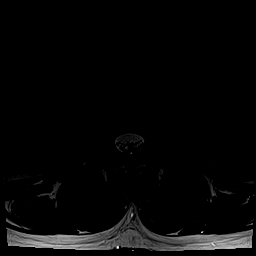
[im 40/40]
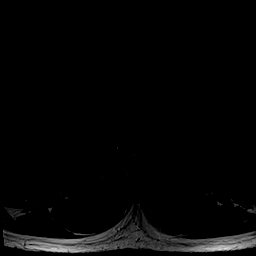

[Series 7: T1 · axial · 4.0mm · 0.35mm/px · z∈[+15,+170]mm · 3 of 40 slices shown (2 of 2)]
[im 6/40]
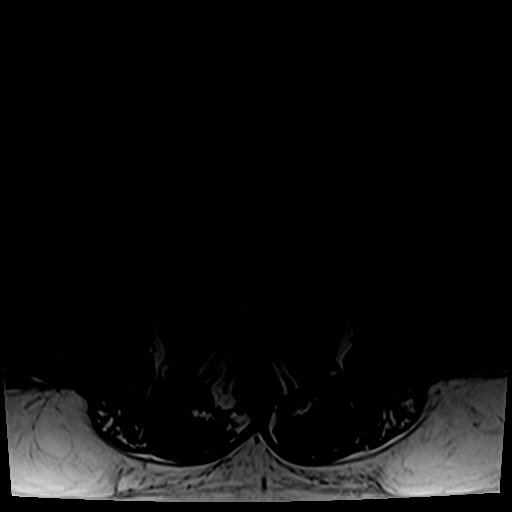
[im 20/40]
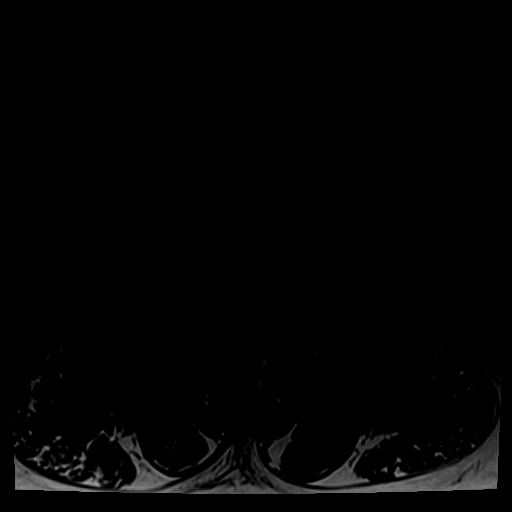
[im 34/40]
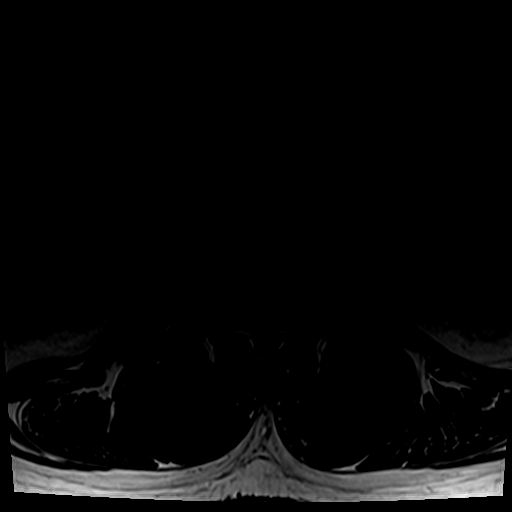

[24 of 48 positions shown; findings below may reference images not displayed]

FINDINGS: Segmentation:  Standard.

Alignment:  Physiologic.

Vertebrae: No fracture, evidence of discitis, or bone lesion. Mild
degenerative endplate marrow edema at L4-L5.

Conus medullaris and cauda equina: Conus extends to the L1 level.
Conus and cauda equina appear normal.

Paraspinal and other soft tissues: Negative.

Disc levels:

T12-L1:  Negative.

L1-L2:  Negative.

L2-L3:  Negative.

L3-L4: Unchanged small diffuse disc bulge and mild bilateral facet
arthropathy. Unchanged mild left greater than right neuroforaminal
stenosis. No spinal canal stenosis.

L4-L5: Unchanged moderate diffuse disc bulge with superimposed right
foraminal and far lateral disc protrusion. New small right
paracentral disc protrusion and annular fissure. Resolved left
paracentral disc protrusion. Unchanged mild bilateral facet
arthropathy. Slightly progressive mild central spinal canal stenosis
with new moderate right and mild left lateral recess stenosis.
Unchanged severe right neuroforaminal stenosis.

L5-S1: Unchanged minimal diffuse disc bulge. Progressive moderate to
severe bilateral facet arthropathy with new 7 x 6 x 11 mm left-sided
synovial cyst medially displacing the descending left S1 nerve root
and mildly narrowing the left spinal canal. No neuroforaminal
stenosis.
IMPRESSION: 1. New 11 mm left-sided synovial cyst at L5-S1 medially displacing
the descending left S1 nerve root and resulting in mild left-sided
spinal canal stenosis.
2. Slightly progressive degenerative disc disease at L4-L5 with new
right paracentral disc protrusion/annular fissure and slightly
worsening mild central spinal canal stenosis with new moderate right
lateral recess stenosis potentially affecting the descending right
L5 nerve root. Unchanged severe right neuroforaminal stenosis due to
right foraminal and far lateral disc protrusion.

## 2019-04-03 ENCOUNTER — Ambulatory Visit (INDEPENDENT_AMBULATORY_CARE_PROVIDER_SITE_OTHER): Payer: Medicare Other

## 2019-04-03 ENCOUNTER — Other Ambulatory Visit: Payer: Self-pay

## 2019-04-03 DIAGNOSIS — F200 Paranoid schizophrenia: Secondary | ICD-10-CM

## 2019-04-03 NOTE — Progress Notes (Signed)
Patient presented today with a flat affect and good mood. Patient feeling good, he is staying inside a lot due to heat and COVID, but he is doing well. Patients injection of Prolixin Decanoate 25 mg/mL was prepared as orderedand 1 mL was administered inpatientsrightdeltoid.Patient will call with any questions or concerns and will return in 2 weeks for his next scheduled injection. 

## 2019-04-04 ENCOUNTER — Other Ambulatory Visit: Payer: Self-pay | Admitting: Internal Medicine

## 2019-04-04 DIAGNOSIS — I10 Essential (primary) hypertension: Secondary | ICD-10-CM

## 2019-04-15 ENCOUNTER — Other Ambulatory Visit: Payer: Self-pay

## 2019-04-15 ENCOUNTER — Ambulatory Visit (INDEPENDENT_AMBULATORY_CARE_PROVIDER_SITE_OTHER): Payer: Medicare Other | Admitting: Internal Medicine

## 2019-04-15 VITALS — BP 138/83 | HR 81 | Temp 98.1°F | Ht 72.0 in | Wt 238.1 lb

## 2019-04-15 DIAGNOSIS — Z79899 Other long term (current) drug therapy: Secondary | ICD-10-CM

## 2019-04-15 DIAGNOSIS — Z23 Encounter for immunization: Secondary | ICD-10-CM | POA: Diagnosis not present

## 2019-04-15 DIAGNOSIS — R3915 Urgency of urination: Secondary | ICD-10-CM

## 2019-04-15 DIAGNOSIS — E1169 Type 2 diabetes mellitus with other specified complication: Secondary | ICD-10-CM | POA: Diagnosis not present

## 2019-04-15 DIAGNOSIS — R351 Nocturia: Secondary | ICD-10-CM

## 2019-04-15 DIAGNOSIS — F200 Paranoid schizophrenia: Secondary | ICD-10-CM

## 2019-04-15 DIAGNOSIS — N401 Enlarged prostate with lower urinary tract symptoms: Secondary | ICD-10-CM | POA: Diagnosis not present

## 2019-04-15 DIAGNOSIS — I10 Essential (primary) hypertension: Secondary | ICD-10-CM

## 2019-04-15 DIAGNOSIS — Z7984 Long term (current) use of oral hypoglycemic drugs: Secondary | ICD-10-CM

## 2019-04-15 DIAGNOSIS — Z0000 Encounter for general adult medical examination without abnormal findings: Secondary | ICD-10-CM

## 2019-04-15 LAB — GLUCOSE, CAPILLARY: Glucose-Capillary: 276 mg/dL — ABNORMAL HIGH (ref 70–99)

## 2019-04-15 LAB — POCT GLYCOSYLATED HEMOGLOBIN (HGB A1C): Hemoglobin A1C: 8.4 % — AB (ref 4.0–5.6)

## 2019-04-15 MED ORDER — JARDIANCE 10 MG PO TABS: 10.0000 mg | ORAL_TABLET | Freq: Every day | ORAL | 1 refills | Status: DC

## 2019-04-15 NOTE — Assessment & Plan Note (Signed)
-  This problem is chronic and stable -Patient is to go on Flomax but has been off this medication -Since the beginning of this year he states his urinary symptoms are well controlled usually but does complain of some episodes of urgency -We have decrease his doxazosin from 4 mg to 2 mg as he was having some orthostatic hypotension.  Patient states that since the reduction his urinary symptoms of actually improved and does not want to restart Flomax nor increase his doxazosin back to 4 mg -We will continue with doxazosin 2 mg for now

## 2019-04-15 NOTE — Assessment & Plan Note (Signed)
-  Patient to get flu shot today

## 2019-04-15 NOTE — Assessment & Plan Note (Signed)
-  This problem is chronic and stable -Patient blood pressure is 138/83 today -Patient is compliant with losartan 100 mg as well as doxazosin 2 mg daily -He states that his lightheadedness has improved but he does have some mild persistent lightheadedness -He denies any other complaints at this time -We will continue current regimen for now

## 2019-04-15 NOTE — Assessment & Plan Note (Addendum)
Lab Results  Component Value Date   HGBA1C 8.4 (A) 04/15/2019   HGBA1C 7.3 (A) 01/21/2019   HGBA1C 6.8 (A) 07/09/2018     Assessment: Diabetes control:  Uncontrolled Progress toward A1C goal:   Deteriorated Comments: Patient is not on any medications currently.  He states that he has not been following a good diet.  Plan: Medications:  We will restart the patient on Jardiance 10 mg daily Home glucose monitoring: Frequency:   Timing:   Instruction/counseling given: discussed diet Educational resources provided:   Self management tools provided:   Other plans: I explained to the patient the importance of taking Jardiance and that his diabetes is now uncontrolled.  Patient was initially reluctant to start medication and states that he feels well.  Explained the patient that his blood sugars are high and that is important that he start the medication at this time.  Previously he tolerated Jardiance well but did not tolerate Metformin.  After more discussion patient stated that he was willing to try restarting the medication.  Patient to follow-up in 3 months for repeat A1c  Addendum: Patient noted to have a mildly elevated creatinine 1.38 up from 1.18 on his last BMP.  Patient's creatinine has varied from 1.2-1.4.  I called the patient with his blood work and explained to him that we need to check a repeat BMP at his follow-up visit.  No further work-up at this time.  We will continue to monitor his creatinine closely.  Patient expresses understanding and is in agreement with plan.

## 2019-04-15 NOTE — Patient Instructions (Signed)
-  It was a pleasure seeing you today -Your A1c has worsened to 8.4.  We will start you on Jardiance 10 mg today -Please follow-up in 3 months for repeat A1c -Your blood pressure is well controlled.  Keep up the good work -Please follow-up for your eye exam -We will give you a flu shot today -Please call me if any questions or concerns

## 2019-04-15 NOTE — Progress Notes (Signed)
   Subjective:    Patient ID: Wayne Schmidt, male    DOB: [birth year], 63 y.o.   MRN: [id number]  HPI  I have seen and examined this patient.  Patient is here for routine follow-up of his hypertension and diabetes.  Patient states that he has no new complaints currently.  He states that his urinary frequency has improved since his last visit but he still has some issues of urge where he needs to rush to the restroom.  He denies any other complaints at this time and states that he is compliant with all his medications   Review of Systems  Constitutional: Negative.   HENT: Negative.   Respiratory: Negative.   Cardiovascular: Negative.   Gastrointestinal: Negative.   Genitourinary: Positive for urgency.  Musculoskeletal: Negative.   Neurological: Negative.   Psychiatric/Behavioral: Negative.        Objective:   Physical Exam Constitutional:      Appearance: Normal appearance.  HENT:     Head: Normocephalic and atraumatic.  Neck:     Musculoskeletal: Neck supple.  Cardiovascular:     Rate and Rhythm: Normal rate and regular rhythm.  Pulmonary:     Effort: Pulmonary effort is normal.     Breath sounds: Normal breath sounds. No wheezing.  Abdominal:     General: Bowel sounds are normal. There is no distension.     Palpations: Abdomen is soft.     Tenderness: There is no abdominal tenderness.  Musculoskeletal:        General: No swelling or tenderness.  Lymphadenopathy:     Cervical: No cervical adenopathy.  Neurological:     General: No focal deficit present.     Mental Status: He is alert and oriented to person, place, and time.  Psychiatric:        Mood and Affect: Mood normal.        Behavior: Behavior normal.           Assessment & Plan:  Please see problem based charting for assessment and plan:

## 2019-04-15 NOTE — Assessment & Plan Note (Signed)
-  This problem is chronic and stable -Patient follows up with psychiatry as an outpatient for this -Continue with fluphenazine per psychiatry -Patient states that his mood and affect stable but blames the medication for his weight gain and his worsening blood sugars and wants to talk to his psychiatrist about this -No further work-up at this time

## 2019-04-16 LAB — BMP8+ANION GAP
Anion Gap: 12 mmol/L (ref 10.0–18.0)
BUN/Creatinine Ratio: 10 (ref 10–24)
BUN: 14 mg/dL (ref 8–27)
CO2: 24 mmol/L (ref 20–29)
Calcium: 9.6 mg/dL (ref 8.6–10.2)
Chloride: 103 mmol/L (ref 96–106)
Creatinine, Ser: 1.38 mg/dL — ABNORMAL HIGH (ref 0.76–1.27)
GFR calc Af Amer: 62 mL/min/{1.73_m2} (ref >59)
GFR calc non Af Amer: 54 mL/min/{1.73_m2} — ABNORMAL LOW (ref >59)
Glucose: 201 mg/dL — ABNORMAL HIGH (ref 65–99)
Potassium: 4.7 mmol/L (ref 3.5–5.2)
Sodium: 139 mmol/L (ref 134–144)

## 2019-04-17 ENCOUNTER — Other Ambulatory Visit: Payer: Self-pay

## 2019-04-17 ENCOUNTER — Ambulatory Visit (INDEPENDENT_AMBULATORY_CARE_PROVIDER_SITE_OTHER): Payer: Medicare Other

## 2019-04-17 ENCOUNTER — Other Ambulatory Visit (HOSPITAL_COMMUNITY): Payer: Self-pay

## 2019-04-17 DIAGNOSIS — G2401 Drug induced subacute dyskinesia: Secondary | ICD-10-CM

## 2019-04-17 DIAGNOSIS — F200 Paranoid schizophrenia: Secondary | ICD-10-CM | POA: Diagnosis not present

## 2019-04-17 MED ORDER — FLUPHENAZINE DECANOATE 25 MG/ML IJ SOLN: 25.0000 mg | INTRAMUSCULAR | 2 refills | Status: DC

## 2019-04-17 NOTE — Progress Notes (Signed)
Patient presented today with a flat affect and good mood.Patient feeling good, he is staying inside a lot due to heat and COVID, but he is doing well.Patients injection of Prolixin Decanoate 25 mg/mL was prepared as orderedand 1 mL was administered inpatientsrightdeltoid, today was supposed to be the left, however patient got flu shot in the left deltoid yesterday. Patient will call with any questions or concerns and will return in 2 weeks for his next scheduled injection

## 2019-04-18 DIAGNOSIS — H52203 Unspecified astigmatism, bilateral: Secondary | ICD-10-CM | POA: Diagnosis not present

## 2019-04-18 DIAGNOSIS — E119 Type 2 diabetes mellitus without complications: Secondary | ICD-10-CM | POA: Diagnosis not present

## 2019-04-18 LAB — HM DIABETES EYE EXAM

## 2019-04-21 ENCOUNTER — Encounter: Payer: Self-pay | Admitting: *Deleted

## 2019-05-01 ENCOUNTER — Ambulatory Visit (INDEPENDENT_AMBULATORY_CARE_PROVIDER_SITE_OTHER): Payer: Medicare Other

## 2019-05-01 ENCOUNTER — Other Ambulatory Visit: Payer: Self-pay

## 2019-05-01 DIAGNOSIS — F200 Paranoid schizophrenia: Secondary | ICD-10-CM | POA: Diagnosis not present

## 2019-05-01 NOTE — Progress Notes (Signed)
Patient presented today with a flat affect and good mood.Patient feeling good, he is staying inside a lot due to heat and COVID, but he is doing well.Patients injection of Prolixin Decanoate 25 mg/mL was prepared as orderedand 1 mL was administered inpatients left deltoid.Patient will call with any questions or concerns and will return in 2 weeks for his next scheduled injection. 

## 2019-05-14 ENCOUNTER — Other Ambulatory Visit: Payer: Self-pay | Admitting: Internal Medicine

## 2019-05-14 DIAGNOSIS — I10 Essential (primary) hypertension: Secondary | ICD-10-CM

## 2019-05-15 ENCOUNTER — Ambulatory Visit (INDEPENDENT_AMBULATORY_CARE_PROVIDER_SITE_OTHER): Payer: Medicare Other | Admitting: Psychiatry

## 2019-05-15 ENCOUNTER — Encounter (HOSPITAL_COMMUNITY): Payer: Self-pay | Admitting: Psychiatry

## 2019-05-15 ENCOUNTER — Other Ambulatory Visit: Payer: Self-pay

## 2019-05-15 ENCOUNTER — Ambulatory Visit (INDEPENDENT_AMBULATORY_CARE_PROVIDER_SITE_OTHER): Payer: Medicare Other

## 2019-05-15 DIAGNOSIS — G2401 Drug induced subacute dyskinesia: Secondary | ICD-10-CM | POA: Diagnosis not present

## 2019-05-15 DIAGNOSIS — F200 Paranoid schizophrenia: Secondary | ICD-10-CM

## 2019-05-15 MED ORDER — FLUPHENAZINE DECANOATE 25 MG/ML IJ SOLN: 25.0000 mg | INTRAMUSCULAR | 2 refills | Status: DC

## 2019-05-15 NOTE — Progress Notes (Addendum)
Virtual Visit via Telephone Note  I connected with Wayne Schmidt on 05/15/19 at 11:00 AM EST by telephone and verified that I am speaking with the correct person using two identifiers.   I discussed the limitations, risks, security and privacy concerns of performing an evaluation and management service by telephone and the availability of in person appointments. I also discussed with the patient that there may be a patient responsible charge related to this service. The patient expressed understanding and agreed to proceed.   History of Present Illness: Patient was evaluated by phone session.  Patient is taking Prolixin injection every 4 weeks and he feels it is working.  His paranoia is stable and he denies any hallucination, suicidal thoughts.  Recently had blood work and his hemoglobin A1c is increased from the past.  Now he is taking Jardiance to control his blood sugar.  He denies any irritability, anger or any agitation.  Described his mood is stable.  He has some paranoia but his paranoia does not interfere in his daily activities.  He is sleeping good.  He has tremors but he is not interested to take any medication.  In the past we have tried hydroxyzine but he does not like the side effects.  He had a good support from his neighbors.  He admitted weight gain because of not going outside and he blamed increased weight may have caused worsening of sugar.  However he is now willing to lose weight and trying harder to watch his calorie intake.  Patient like to continue current medication.   Past Psychiatric History:Reviewed. H/Oschizophreniawith multiple inpatient treatment. H/O of hallucination, paranoia and suicidal attempt by overdose. [facility]. TookHaldol and Thorazine buthad side effects.OnProlixin injection for past 30 years.Tried hydroxyzine but could not tolerate.  Recent Results (from the past 2160 hour(s))  Glucose, capillary     Status: Abnormal   Collection Time: 04/15/19 11:03 AM  Result Value Ref Range   Glucose-Capillary 276 (H) 70 - 99 mg/dL  POC Hbg A1C     Status: Abnormal   Collection Time: 04/15/19 11:11 AM  Result Value Ref Range   Hemoglobin A1C 8.4 (A) 4.0 - 5.6 %   HbA1c POC (<> result, manual entry)     HbA1c, POC (prediabetic range)     HbA1c, POC (controlled diabetic range)    BMP8+Anion Gap     Status: Abnormal   Collection Time: 04/15/19 11:59 AM  Result Value Ref Range   Glucose 201 (H) 65 - 99 mg/dL   BUN 14 8 - 27 mg/dL   Creatinine, Ser 1.38 (H) 0.76 - 1.27 mg/dL   GFR calc non Af Amer 54 (L) >59 mL/min/1.73   GFR calc Af Amer 62 >59 mL/min/1.73   BUN/Creatinine Ratio 10 10 - 24   Sodium 139 134 - 144 mmol/L   Potassium 4.7 3.5 - 5.2 mmol/L   Chloride 103 96 - 106 mmol/L   CO2 24 20 - 29 mmol/L   Anion Gap 12.0 10.0 - 18.0 mmol/L   Calcium 9.6 8.6 - 10.2 mg/dL  HM DIABETES EYE EXAM     Status: None   Collection Time: 04/18/19 12:00 AM  Result Value Ref Range   HM Diabetic Eye Exam No Retinopathy No Retinopathy    Comment: seen by Dr. Delman Cheadle  HM DIABETES EYE EXAM     Status: None   Collection Time: 04/18/19 12:00 AM  Result Value Ref Range   HM Diabetic Eye Exam  Psychiatric Specialty Exam: Physical Exam  ROS  There were no vitals taken for this visit.There is no height or weight on file to calculate BMI.  General Appearance: NA  Eye Contact:  NA  Speech:  Slow  Volume:  Decreased  Mood:  Euthymic  Affect:  NA  Thought Process:  Descriptions of Associations: Circumstantial  Orientation:  Full (Time, Place, and Person)  Thought Content:  Paranoid Ideation and Rumination  Suicidal Thoughts:  No  Homicidal Thoughts:  No  Memory:  Immediate;   Fair Recent;   Fair Remote;   Fair  Judgement:  Good  Insight:  Present  Psychomotor Activity:  NA  Concentration:  Concentration: Fair and Attention Span: Fair  Recall:  [employer] of Knowledge:  Fair  Language:  Fair  Akathisia:   tremors  Handed:  Right  AIMS (if indicated):     Assets:  Communication Skills Desire for Improvement Housing Resilience Social Support  ADL's:  Intact  Cognition:  WNL  Sleep:   ok      Assessment and Plan: Schizophrenia chronic paranoid type.  Tardive dyskinesia.  Patient is a stable on his Prolixin 25 mg intramuscular every 2 weeks.  Discussed medication side effects and benefits.  I reviewed his blood work results.  Encouraged to watch his calorie intake and start walking 10 to 15 minutes 2-3 times a week.  Patient is not interested in therapy.  Recommended to call us back if he has any question of any concern.  Follow-up in 3 months.  Follow Up Instructions:    I discussed the assessment and treatment plan with the patient. The patient was provided an opportunity to ask questions and all were answered. The patient agreed with the plan and demonstrated an understanding of the instructions.   The patient was advised to call back or seek an in-person evaluation if the symptoms worsen or if the condition fails to improve as anticipated.  I provided 15 minutes of non-face-to-face time during this encounter.   [name], MD

## 2019-05-15 NOTE — Progress Notes (Signed)
Patient presented today with a flat affect and good mood. Patient feeling good, he is staying inside a lot due to heat and COVID, but he is doing well. Patients injection of Prolixin Decanoate 25 mg/mL was prepared as orderedand 1 mL was administered inpatientsrightdeltoid.Patient will call with any questions or concerns and will return in 2 weeks for his next scheduled injection. 

## 2019-05-28 ENCOUNTER — Ambulatory Visit (INDEPENDENT_AMBULATORY_CARE_PROVIDER_SITE_OTHER): Payer: Medicare Other

## 2019-05-28 ENCOUNTER — Other Ambulatory Visit: Payer: Self-pay

## 2019-05-28 DIAGNOSIS — F200 Paranoid schizophrenia: Secondary | ICD-10-CM

## 2019-05-28 NOTE — Progress Notes (Signed)
Patient presented today with a flat affect and good mood.Patient feeling good, he is staying inside a lot due to heat and COVID, but he is doing well.Patients injection of Prolixin Decanoate 25 mg/mL was prepared as orderedand 1 mL was administered inpatientsleftdeltoid.Patient will call with any questions or concerns and will return in 2 weeks for his next scheduled injection

## 2019-06-04 ENCOUNTER — Ambulatory Visit (INDEPENDENT_AMBULATORY_CARE_PROVIDER_SITE_OTHER): Payer: Medicare Other | Admitting: Internal Medicine

## 2019-06-04 VITALS — BP 127/89 | HR 97 | Temp 97.9°F | Ht 72.5 in | Wt 240.9 lb

## 2019-06-04 DIAGNOSIS — M79672 Pain in left foot: Secondary | ICD-10-CM

## 2019-06-04 DIAGNOSIS — M79671 Pain in right foot: Secondary | ICD-10-CM

## 2019-06-04 DIAGNOSIS — M79673 Pain in unspecified foot: Secondary | ICD-10-CM | POA: Insufficient documentation

## 2019-06-04 DIAGNOSIS — M722 Plantar fascial fibromatosis: Secondary | ICD-10-CM | POA: Diagnosis not present

## 2019-06-04 MED ORDER — VOLTAREN 1 % EX GEL: 2.0000 g | Freq: Four times a day (QID) | CUTANEOUS | 10 refills | Status: DC

## 2019-06-04 NOTE — Assessment & Plan Note (Signed)
Patient describes heel pain at the weightbearing portion of his heel that occasionally shoots to the arch.  Stretching helps.  Presentation appears consistent with plantar fasciitis -Encourage patient to stretch and use firm ball to massage his foot -Ice as needed -Tylenol as needed -Voltaren gel

## 2019-06-04 NOTE — Patient Instructions (Signed)
Thank you for visiting [place] in the clinic today.  Below is a summary of what we discussed:  1.  Plantars fasciitis: The cause of your pain in your foot is inflammation and thickening of this tissue called the plantar fascia.  We will treat this with the following: -Apply ice packs for 20 minutes at a time as needed throughout the day -Applied Voltaren gel up to 4 times a day as needed.  This will be sent to your pharmacy  -Stretch the foot throughout the day. -Use a hard round ball like a baseball or golf ball to massage the bottom of your foot throughout the day as needed. -Try to stay off of your feet and keep your foot elevated.  2.  Follow-up -Schedule follow-up appointment in 6 to 8 weeks  If you have any questions or concerns, please feel free to reach out to [place].

## 2019-06-04 NOTE — Progress Notes (Signed)
   CC: Heel pain   HPI:  [name] is a 63 y.o. with a PMHx as noted below who presents with bilateral heel pain L>>R. 7/10 in severity.  Pain is located at the weightbearing portion of his heel and occasionally shoots into the arch of his foot.  He has had intermittent pain for the last 2 months. Pt states he was trying to wait to see his PCP but wasn't able to tolerate it.  Pain has been about the same throughout the last 2 months. Tried taking tylenol and Vaseline, but didn't improve symptoms. Walking makes it worse.  Stretching his foot makes it feel better.   Past Medical History:  Diagnosis Date  . Diabetes mellitus without complication ([place])   . High risk sexual behavior 10/2007  . Hypertension   . Microcytosis   . Schizophrenia ([place])   . Substance abuse ([place])    tobacco use   Review of Systems:   Denies any headache, vision changes, chest pain, shortness of breath, cough, abdominal pain, nausea or vomiting, changes in his bowel or bladder habits.  Physical Exam:  Vitals:   06/04/19 1416  BP: 127/89  Pulse: 97  Temp: 97.9 F (36.6 C)  TempSrc: Oral  SpO2: 100%  Weight: 240 lb 14.4 oz (109.3 kg)  Height: 6' 0.5" (1.842 m)   Physical Exam Vitals signs reviewed.  Constitutional:      General: He is not in acute distress.    Appearance: Normal appearance. He is normal weight. He is not ill-appearing, toxic-appearing or diaphoretic.  Eyes:     Extraocular Movements: Extraocular movements intact.  Musculoskeletal:     Comments: Bilateral heels nontender to palpation  Skin:    General: Skin is dry.     Comments: Flaking skin bilateral feet  Neurological:     General: No focal deficit present.     Mental Status: He is alert and oriented to person, place, and time.     Sensory: No sensory deficit.     Motor: No weakness.  Psychiatric:        Mood and Affect: Mood normal.    Assessment & Plan:   See Encounters Tab for problem based charting.  Patient  seen with Dr. Heber Teresita

## 2019-06-11 ENCOUNTER — Other Ambulatory Visit: Payer: Self-pay

## 2019-06-11 ENCOUNTER — Encounter (HOSPITAL_COMMUNITY): Payer: Self-pay | Admitting: *Deleted

## 2019-06-11 ENCOUNTER — Ambulatory Visit (INDEPENDENT_AMBULATORY_CARE_PROVIDER_SITE_OTHER): Payer: Medicare Other | Admitting: *Deleted

## 2019-06-11 DIAGNOSIS — F200 Paranoid schizophrenia: Secondary | ICD-10-CM | POA: Diagnosis not present

## 2019-06-11 NOTE — Progress Notes (Signed)
Internal Medicine Clinic Attending ° °I saw and evaluated the patient.  I personally confirmed the key portions of the history and exam documented by Dr. Alexander and I reviewed pertinent patient test results.  The assessment, diagnosis, and plan were formulated together and I agree with the documentation in the resident’s note.  °

## 2019-06-11 NOTE — Progress Notes (Signed)
Patient presented today with a flat affect and good mood. Patient feeling good,  inside a lot COVID, but he is doing well. Patients injection of Prolixin Decanoate 25 mg/mL was prepared as ordered and 1 mL was administered in patients left deltoid. Patient will call with any questions or concerns and will return in 2 weeks for next scheduled injection.

## 2019-06-13 ENCOUNTER — Other Ambulatory Visit: Payer: Self-pay | Admitting: Internal Medicine

## 2019-06-13 DIAGNOSIS — E7849 Other hyperlipidemia: Secondary | ICD-10-CM

## 2019-06-13 DIAGNOSIS — I10 Essential (primary) hypertension: Secondary | ICD-10-CM

## 2019-06-25 ENCOUNTER — Other Ambulatory Visit: Payer: Self-pay

## 2019-06-25 ENCOUNTER — Ambulatory Visit (INDEPENDENT_AMBULATORY_CARE_PROVIDER_SITE_OTHER): Payer: Medicare Other | Admitting: *Deleted

## 2019-06-25 DIAGNOSIS — F200 Paranoid schizophrenia: Secondary | ICD-10-CM | POA: Diagnosis not present

## 2019-06-25 NOTE — Progress Notes (Signed)
Patient arrived for his scheduled injection of Prolixin 25mg . Medication administered as ordered in right deltoid. No complaints or issues. Flat affect, brightens when spoken to and states he has been feeling "pretty good." Laughing with this Probation officer and making small talk. Denies SI/HI.

## 2019-07-09 ENCOUNTER — Other Ambulatory Visit: Payer: Self-pay | Admitting: Internal Medicine

## 2019-07-09 ENCOUNTER — Ambulatory Visit (INDEPENDENT_AMBULATORY_CARE_PROVIDER_SITE_OTHER): Payer: Medicare Other

## 2019-07-09 ENCOUNTER — Other Ambulatory Visit: Payer: Self-pay

## 2019-07-09 DIAGNOSIS — F200 Paranoid schizophrenia: Secondary | ICD-10-CM

## 2019-07-09 DIAGNOSIS — I10 Essential (primary) hypertension: Secondary | ICD-10-CM

## 2019-07-09 NOTE — Progress Notes (Signed)
Patient presented today with a flat affect and good mood. Patient feeling good,  inside a lot COVID, but he is doing well. Patients injection of Prolixin Decanoate 25 mg/mL was prepared as ordered and 1 mL was administered in patients left deltoid. Patient will call with any questions or concerns and will return in 2 weeks for next scheduled injection.

## 2019-07-23 ENCOUNTER — Encounter (HOSPITAL_COMMUNITY): Payer: Self-pay

## 2019-07-23 ENCOUNTER — Ambulatory Visit (INDEPENDENT_AMBULATORY_CARE_PROVIDER_SITE_OTHER): Payer: Medicare Other

## 2019-07-23 ENCOUNTER — Other Ambulatory Visit: Payer: Self-pay

## 2019-07-23 DIAGNOSIS — F200 Paranoid schizophrenia: Secondary | ICD-10-CM | POA: Diagnosis not present

## 2019-07-23 NOTE — Progress Notes (Signed)
Patient presented today with a flat affect and good mood.Patient feeling good, he is staying inside a lot due to heat and COVID, but he is doing well. Patient stated that he felt a little dizzier than usual today but that he always feels a little dizzy with this medication.Patients injection of Prolixin Decanoate 25 mg/mL was prepared as orderedand 1 mL was administered inpatientsleftdeltoid.Patient will call with any questions or concerns and will return in 2 weeks for his next scheduled injection

## 2019-07-29 ENCOUNTER — Ambulatory Visit (INDEPENDENT_AMBULATORY_CARE_PROVIDER_SITE_OTHER): Payer: Medicare Other | Admitting: Internal Medicine

## 2019-07-29 VITALS — BP 130/84 | HR 96 | Temp 98.3°F | Ht 72.0 in | Wt 236.8 lb

## 2019-07-29 DIAGNOSIS — F17210 Nicotine dependence, cigarettes, uncomplicated: Secondary | ICD-10-CM

## 2019-07-29 DIAGNOSIS — R7989 Other specified abnormal findings of blood chemistry: Secondary | ICD-10-CM

## 2019-07-29 DIAGNOSIS — Z0000 Encounter for general adult medical examination without abnormal findings: Secondary | ICD-10-CM

## 2019-07-29 DIAGNOSIS — N401 Enlarged prostate with lower urinary tract symptoms: Secondary | ICD-10-CM

## 2019-07-29 DIAGNOSIS — I10 Essential (primary) hypertension: Secondary | ICD-10-CM

## 2019-07-29 DIAGNOSIS — M722 Plantar fascial fibromatosis: Secondary | ICD-10-CM | POA: Diagnosis not present

## 2019-07-29 DIAGNOSIS — F17200 Nicotine dependence, unspecified, uncomplicated: Secondary | ICD-10-CM

## 2019-07-29 DIAGNOSIS — M25511 Pain in right shoulder: Secondary | ICD-10-CM

## 2019-07-29 DIAGNOSIS — E1169 Type 2 diabetes mellitus with other specified complication: Secondary | ICD-10-CM

## 2019-07-29 DIAGNOSIS — Z7984 Long term (current) use of oral hypoglycemic drugs: Secondary | ICD-10-CM

## 2019-07-29 DIAGNOSIS — M25522 Pain in left elbow: Secondary | ICD-10-CM | POA: Diagnosis not present

## 2019-07-29 DIAGNOSIS — Z79899 Other long term (current) drug therapy: Secondary | ICD-10-CM

## 2019-07-29 DIAGNOSIS — R351 Nocturia: Secondary | ICD-10-CM

## 2019-07-29 DIAGNOSIS — F200 Paranoid schizophrenia: Secondary | ICD-10-CM

## 2019-07-29 HISTORY — DX: Other specified abnormal findings of blood chemistry: R79.89

## 2019-07-29 LAB — GLUCOSE, CAPILLARY: Glucose-Capillary: 158 mg/dL — ABNORMAL HIGH (ref 70–99)

## 2019-07-29 LAB — POCT GLYCOSYLATED HEMOGLOBIN (HGB A1C): Hemoglobin A1C: 8 % — AB (ref 4.0–5.6)

## 2019-07-29 NOTE — Patient Instructions (Signed)
-  It was a pleasure seeing you today -Your blood pressure is well controlled.  Please continue with your current medications. -Please consider trying to decrease the amount of cigarettes that you are smoking -Please use the Voltaren gel for your elbow pain as well as her shoulder pain -If your foot pain persists over the next 2 to 3 weeks please give me a call and I will refer you to podiatry for your plantar fasciitis -We will check some blood work on you today -Your A1c has improved to 8.  Please continue to watch your diet and exercise over the next couple of months.  If A1c remains elevated at your next visit we will increase your Jardiance. -Please call me with any questions or concerns or if you need any refills

## 2019-07-29 NOTE — Assessment & Plan Note (Signed)
-  Patient had cut back to about 10 cigarettes a day but is now smoking 1-1/2 packs a day. -Encouraged the patient to cut back on smoking but he seems reluctant at this time. -We will discuss this again at his follow-up visit

## 2019-07-29 NOTE — Assessment & Plan Note (Signed)
-  Patient was diagnosed with plantar fasciitis at his last visit in December -He states that he has bilateral foot pain but that this is slowly improving -We will continue Voltaren gel as well as stretching at home -If patient's pain persists over the next 2 to 3 weeks he will give me a call and I will refer him to podiatry for further evaluation

## 2019-07-29 NOTE — Assessment & Plan Note (Signed)
-  This problem chronic and stable -Patient needs to be on Flomax as well as doxazosin 4 mg daily.  Currently he is off Flomax and is only on doxazosin 2 mg daily.  Patient states that overall his urinary symptoms are well controlled and that he feels that it is slowly improving -He would not like to restart his Flomax at this time -We will continue with doxazosin 2 mg for now

## 2019-07-29 NOTE — Assessment & Plan Note (Signed)
BP Readings from Last 3 Encounters:  07/29/19 130/84  06/04/19 127/89  04/15/19 138/83    Lab Results  Component Value Date   NA 139 04/15/2019   K 4.7 04/15/2019   CREATININE 1.38 (H) 04/15/2019    Assessment: Blood pressure control:  Well-controlled Progress toward BP goal:   At goal Comments: Patient is compliant with losartan 100 mg daily as well as doxazosin 2 mg daily  Plan: Medications:  continue current medications Educational resources provided:   Self management tools provided:   Other plans: We will recheck BMP today

## 2019-07-29 NOTE — Progress Notes (Signed)
   Subjective:    Patient ID: Wayne Schmidt, male    DOB: 03/24/1956, 64 y.o.   MRN: [id number]  HPI  I have seen and examined this patient.  Patient is here for routine follow-up of his hypertension and diabetes.  Patient states that he is compliant with all his medications.  He does complain of some persistent bilateral foot pain (was recently diagnosed with plantar fasciitis).  Patient also complains of pain in his left elbow as well as pain in his right deltoid.  He denies any other complaints at this time.  Review of Systems  Constitutional: Negative.   HENT: Negative.   Respiratory: Negative.   Cardiovascular: Negative.   Gastrointestinal: Negative.   Musculoskeletal: Negative for arthralgias.       Patient complains of pain in his left elbow where he elbow bumped somebody as a greeting.  Patient also complains of pain in his right deltoid where he gets his monthly injections for his schizophrenia  Neurological: Negative.   Psychiatric/Behavioral: Negative.        Objective:   Physical Exam Constitutional:      Appearance: Normal appearance.  HENT:     Head: Normocephalic and atraumatic.  Eyes:     Pupils: Pupils are equal, round, and reactive to light.  Cardiovascular:     Rate and Rhythm: Normal rate and regular rhythm.     Heart sounds: Normal heart sounds.  Pulmonary:     Effort: Pulmonary effort is normal.     Breath sounds: Normal breath sounds. No wheezing or rales.  Abdominal:     General: Bowel sounds are normal. There is no distension.     Palpations: Abdomen is soft.     Tenderness: There is no abdominal tenderness.  Musculoskeletal:        General: No swelling.     Cervical back: Neck supple.     Comments: -Patient with some mild tenderness over his medial epicondyle as well as some mild tenderness over palpation of his right deltoid -No swelling or erythema or tenderness or increased warmth noted in either area  Lymphadenopathy:     Cervical: No  cervical adenopathy.  Neurological:     General: No focal deficit present.     Mental Status: He is alert and oriented to person, place, and time.  Psychiatric:        Mood and Affect: Mood normal.        Behavior: Behavior normal.           Assessment & Plan:  Please see problem based charting for assessment and plan:

## 2019-07-29 NOTE — Assessment & Plan Note (Signed)
-  We talked about the importance of getting the Covid vaccine when this is available and he was given information about who to contact to schedule this today

## 2019-07-29 NOTE — Assessment & Plan Note (Addendum)
-  Patient noted to have an elevated creatinine of 1.3 at his last visit.  Looking back at his BMPs since 2017 he has ranged from 1.16-1.41 -Suspect that he may have mild CKD secondary to his underlying diabetes -We will recheck a BMP today -No further work-up at this time  Addendum: -Repeat BMP showed a creatinine of 1.16 which is within normal limits.  No further work-up at this time. -Results discussed with patient via phone.  He expresses understanding and is in agreement with plan.

## 2019-07-29 NOTE — Assessment & Plan Note (Signed)
-  This problem is chronic and stable -Patient follows up with psychiatry as an outpatient for this -Continue with fluphenazine per psychiatry -Patient states that his mood has improved but overall is upset that his dose was increased by his psychiatrist and blames this for his weight gain -No further work-up at this time

## 2019-07-29 NOTE — Assessment & Plan Note (Signed)
Lab Results  Component Value Date   HGBA1C 8.0 (A) 07/29/2019   HGBA1C 8.4 (A) 04/15/2019   HGBA1C 7.3 (A) 01/21/2019     Assessment: Diabetes control:  Fair Progress toward A1C goal:   Improved Comments: Patient is compliant with Jardiance 10 mg daily  Plan: Medications:  continue current medications Home glucose monitoring: Frequency:   Timing:   Instruction/counseling given: discussed the need for weight loss Educational resources provided:   Self management tools provided:   Other plans: Patient would like to attempt diet and exercise prior to increasing his dose of Jardiance.  He is agreeable to increase his Jardiance if his A1c is over 7 at his next visit.

## 2019-07-30 LAB — BMP8+ANION GAP
Anion Gap: 15 mmol/L (ref 10.0–18.0)
BUN/Creatinine Ratio: 12 (ref 10–24)
BUN: 14 mg/dL (ref 8–27)
CO2: 22 mmol/L (ref 20–29)
Calcium: 9.4 mg/dL (ref 8.6–10.2)
Chloride: 104 mmol/L (ref 96–106)
Creatinine, Ser: 1.16 mg/dL (ref 0.76–1.27)
GFR calc Af Amer: 77 mL/min/{1.73_m2} (ref >59)
GFR calc non Af Amer: 66 mL/min/{1.73_m2} (ref >59)
Glucose: 127 mg/dL — ABNORMAL HIGH (ref 65–99)
Potassium: 4.8 mmol/L (ref 3.5–5.2)
Sodium: 141 mmol/L (ref 134–144)

## 2019-08-06 ENCOUNTER — Other Ambulatory Visit: Payer: Self-pay

## 2019-08-06 ENCOUNTER — Ambulatory Visit (INDEPENDENT_AMBULATORY_CARE_PROVIDER_SITE_OTHER): Payer: Medicare Other | Admitting: *Deleted

## 2019-08-06 VITALS — BP 131/81 | HR 91

## 2019-08-06 DIAGNOSIS — F200 Paranoid schizophrenia: Secondary | ICD-10-CM

## 2019-08-06 NOTE — Progress Notes (Signed)
Patient presented today with a blunted affect and good mood. Patient feeling good, Patients injection of Prolixin Decanoate 25 mg/mL was prepared as ordered and 1 mL was administered in patients leftupper outer quadrant. Pt tolerated injection w/o c/o. Pt religiously preoccupied. Patient will call with any questions or concerns and will return in 2 weeks for next scheduled injection.

## 2019-08-14 ENCOUNTER — Encounter (HOSPITAL_COMMUNITY): Payer: Self-pay | Admitting: Psychiatry

## 2019-08-14 ENCOUNTER — Other Ambulatory Visit: Payer: Self-pay

## 2019-08-14 ENCOUNTER — Ambulatory Visit (INDEPENDENT_AMBULATORY_CARE_PROVIDER_SITE_OTHER): Payer: Medicare Other | Admitting: Psychiatry

## 2019-08-14 DIAGNOSIS — F200 Paranoid schizophrenia: Secondary | ICD-10-CM

## 2019-08-14 DIAGNOSIS — G2401 Drug induced subacute dyskinesia: Secondary | ICD-10-CM | POA: Diagnosis not present

## 2019-08-14 MED ORDER — FLUPHENAZINE DECANOATE 25 MG/ML IJ SOLN: 25.0000 mg | INTRAMUSCULAR | 2 refills | Status: DC

## 2019-08-14 NOTE — Progress Notes (Signed)
Virtual Visit via Telephone Note  I connected with Wayne Schmidt on 08/14/19 at 11:00 AM EST by telephone and verified that I am speaking with the correct person using two identifiers.   I discussed the limitations, risks, security and privacy concerns of performing an evaluation and management service by telephone and the availability of in person appointments. I also discussed with the patient that there may be a patient responsible charge related to this service. The patient expressed understanding and agreed to proceed.   History of Present Illness: Patient was evaluated by phone session.  He is taking Prolixin injection every 2 weeks.  His paranoia is a stable.  Sometimes he gets frustrated but denies any anger, hallucination.  He is preoccupied with religion and prays to God every day.  He is sleeping good.  Do not recall any recent crying spells or any feeling of hopelessness.  Recently he has blood work and his hemoglobin A1c is 8.  He has mild tremors but does not interfere in his daily activity.  He does not want to change or add any medication.  He had a good support from his neighbors and extended family.  He had a good Christmas.  His appetite is okay.  His energy level is fair.  He is not interested in therapy.    Past Psychiatric History:Reviewed. H/Oschizophreniawith multiple inpatient treatment. H/O of hallucination, paranoia and suicidal attempt by overdose. [facility]. TookHaldol and Thorazine buthad side effects.OnProlixin injection for past 30 years.Tried hydroxyzine and Ingreeza but could not tolerate.  Recent Results (from the past 2160 hour(s))  Glucose, capillary     Status: Abnormal   Collection Time: 07/29/19 10:11 AM  Result Value Ref Range   Glucose-Capillary 158 (H) 70 - 99 mg/dL  POC Hbg A1C     Status: Abnormal   Collection Time: 07/29/19 10:19 AM  Result Value Ref Range   Hemoglobin A1C 8.0 (A) 4.0 - 5.6 %   HbA1c POC (<> result, manual  entry)     HbA1c, POC (prediabetic range)     HbA1c, POC (controlled diabetic range)    BMP8+Anion Gap     Status: Abnormal   Collection Time: 07/29/19 11:15 AM  Result Value Ref Range   Glucose 127 (H) 65 - 99 mg/dL   BUN 14 8 - 27 mg/dL   Creatinine, Ser 1.16 0.76 - 1.27 mg/dL   GFR calc non Af Amer 66 >59 mL/min/1.73   GFR calc Af Amer 77 >59 mL/min/1.73   BUN/Creatinine Ratio 12 10 - 24   Sodium 141 134 - 144 mmol/L   Potassium 4.8 3.5 - 5.2 mmol/L   Chloride 104 96 - 106 mmol/L   CO2 22 20 - 29 mmol/L   Anion Gap 15.0 10.0 - 18.0 mmol/L   Calcium 9.4 8.6 - 10.2 mg/dL     Psychiatric Specialty Exam: Physical Exam  Review of Systems  There were no vitals taken for this visit.There is no height or weight on file to calculate BMI.  General Appearance: NA  Eye Contact:  NA  Speech:  Slow  Volume:  Decreased  Mood:  Euthymic  Affect:  NA  Thought Process:  Descriptions of Associations: Circumstantial  Orientation:  NA  Thought Content:  Paranoid Ideation, Rumination and religious preoccupation  Suicidal Thoughts:  No  Homicidal Thoughts:  No  Memory:  Immediate;   Fair Recent;   Fair Remote;   Fair  Judgement:  Intact  Insight:  Present  Psychomotor  Activity:  NA  Concentration:  Concentration: Fair and Attention Span: Fair  Recall:  [employer] of Knowledge:  Fair  Language:  Fair  Akathisia:  mild tremors  Handed:  Right  AIMS (if indicated):     Assets:  Communication Skills Desire for Improvement Housing Resilience  ADL's:  Intact  Cognition:  WNL  Sleep:   ok      Assessment and Plan: Schizophrenia chronic paranoid type.  Tardive dyskinesia.  Patient's paranoia and religious preoccupation is stable.  He is not having any suicidal thoughts or homicidal thoughts.  He is not agitated.  He like to continue current medication.  We will continue Prolixin 25 mg intramuscular every 2 weeks.  I reviewed blood work results.  His hemoglobin A1c 8.  Recommended  to watch his calorie intake and walk 10 to 15 minutes 2-3 times a week if outside weather condition is permitted.  Recommended to call us back if is any question or any concern.  Follow-up in 3 months.  Is not interested in therapy.  Follow Up Instructions:    I discussed the assessment and treatment plan with the patient. The patient was provided an opportunity to ask questions and all were answered. The patient agreed with the plan and demonstrated an understanding of the instructions.   The patient was advised to call back or seek an in-person evaluation if the symptoms worsen or if the condition fails to improve as anticipated.  I provided 20 minutes of non-face-to-face time during this encounter.   [name], MD

## 2019-08-15 ENCOUNTER — Telehealth: Payer: Self-pay | Admitting: Internal Medicine

## 2019-08-15 NOTE — Telephone Encounter (Signed)
RTC, no answer, no vm. °SChaplin, RN,BSN ° °

## 2019-08-15 NOTE — Telephone Encounter (Signed)
Pt contact pt [phone]

## 2019-08-18 ENCOUNTER — Telehealth: Payer: Self-pay | Admitting: Internal Medicine

## 2019-08-18 ENCOUNTER — Other Ambulatory Visit: Payer: Self-pay | Admitting: Internal Medicine

## 2019-08-18 DIAGNOSIS — E7849 Other hyperlipidemia: Secondary | ICD-10-CM

## 2019-08-18 DIAGNOSIS — M722 Plantar fascial fibromatosis: Secondary | ICD-10-CM

## 2019-08-18 DIAGNOSIS — E1169 Type 2 diabetes mellitus with other specified complication: Secondary | ICD-10-CM

## 2019-08-18 DIAGNOSIS — I10 Essential (primary) hypertension: Secondary | ICD-10-CM

## 2019-08-18 NOTE — Telephone Encounter (Signed)
Pls contact pt regarding his referral to the podiatry office; [phone]

## 2019-08-18 NOTE — Addendum Note (Signed)
Addended by: Aldine Contes on: 08/18/2019 12:13 PM   Modules accepted: Orders

## 2019-08-18 NOTE — Telephone Encounter (Signed)
I returned phone call to [name] his foot problem they are no better and he said I did what the doctor said and they are no better.The doctor told me to call back and he would make an appointment for me to go to the Foot Doctor.I will send a Message to his Dr London Pepper, [facility] C2/15/202112:02 PM

## 2019-08-20 ENCOUNTER — Encounter (HOSPITAL_COMMUNITY): Payer: Self-pay

## 2019-08-20 ENCOUNTER — Ambulatory Visit (INDEPENDENT_AMBULATORY_CARE_PROVIDER_SITE_OTHER): Payer: Medicare Other

## 2019-08-20 ENCOUNTER — Other Ambulatory Visit: Payer: Self-pay

## 2019-08-20 DIAGNOSIS — F200 Paranoid schizophrenia: Secondary | ICD-10-CM

## 2019-08-20 NOTE — Progress Notes (Signed)
Patient in today for scheduled due Prolixin Decanoate 25 mg/ml IM injection every two weeks. Patient presented with flat affect, level mood and denied any auditory or visual hallucinations, no suicidal or homicidal ideations and no plans or intent to want to harm self or others.  Patient's due Prolixin Decanoate injection prepared as ordered and given to patient in his right upper outer gluteal area.  Patient tolerated due injection without complaint of pain or discomfort and agreed to return in 2 weeks for next due injection.  Patient to call if any problems or concerns prior to next due injection.

## 2019-08-25 ENCOUNTER — Encounter: Payer: Self-pay | Admitting: Internal Medicine

## 2019-08-27 NOTE — Telephone Encounter (Signed)
No answer, no vmail 

## 2019-09-02 ENCOUNTER — Other Ambulatory Visit: Payer: Self-pay

## 2019-09-02 ENCOUNTER — Ambulatory Visit (INDEPENDENT_AMBULATORY_CARE_PROVIDER_SITE_OTHER): Payer: Medicare Other | Admitting: *Deleted

## 2019-09-02 DIAGNOSIS — F200 Paranoid schizophrenia: Secondary | ICD-10-CM

## 2019-09-02 NOTE — Progress Notes (Signed)
Pt in clinic today for due injection of Prolixin 125mg /5. Pt bright. Pleasant and cooperative on approach. IM given in left upper outer quadrent without c/o. Pt to return in two weeks for next due injection.

## 2019-09-03 ENCOUNTER — Ambulatory Visit (HOSPITAL_COMMUNITY): Payer: Medicare Other

## 2019-09-16 ENCOUNTER — Other Ambulatory Visit: Payer: Self-pay

## 2019-09-16 ENCOUNTER — Ambulatory Visit (INDEPENDENT_AMBULATORY_CARE_PROVIDER_SITE_OTHER): Payer: Medicare Other | Admitting: *Deleted

## 2019-09-16 DIAGNOSIS — F200 Paranoid schizophrenia: Secondary | ICD-10-CM

## 2019-09-16 NOTE — Progress Notes (Signed)
Pt in for due injection of Prolixin 125/5. Pt pleasant and cooperative on approach. Injection, Prolixin 1 ml, was given in right upper outer quadrant without c/o. Pt to return in two weeks for next due injection. Pt voiced no c/o or concerns.

## 2019-09-17 ENCOUNTER — Encounter (HOSPITAL_COMMUNITY): Payer: Self-pay | Admitting: *Deleted

## 2019-09-23 ENCOUNTER — Ambulatory Visit (INDEPENDENT_AMBULATORY_CARE_PROVIDER_SITE_OTHER): Payer: Medicare Other | Admitting: Podiatry

## 2019-09-23 ENCOUNTER — Encounter: Payer: Self-pay | Admitting: Podiatry

## 2019-09-23 ENCOUNTER — Other Ambulatory Visit: Payer: Self-pay

## 2019-09-23 ENCOUNTER — Ambulatory Visit (INDEPENDENT_AMBULATORY_CARE_PROVIDER_SITE_OTHER): Payer: Medicare Other

## 2019-09-23 VITALS — BP 121/72 | HR 85 | Resp 16

## 2019-09-23 DIAGNOSIS — M722 Plantar fascial fibromatosis: Secondary | ICD-10-CM | POA: Diagnosis not present

## 2019-09-23 NOTE — Patient Instructions (Signed)

## 2019-09-23 NOTE — Progress Notes (Signed)
Subjective:  Patient ID: Wayne Schmidt, male    DOB: October 26, 1955,  MRN: [id number] HPI Chief Complaint  Patient presents with  . Foot Pain    Plantar heel bilateral (R>L) - aching x 2 months, AM pain, tried stretching, voltaren gel, tylenol  . Diabetes    Last a1c was 8.0  . New Patient (Initial Visit)    64 y.o. male presents with the above complaint.   ROS: Denies fever chills nausea vomiting muscle aches pains calf pain back pain chest pain shortness of breath.  Past Medical History:  Diagnosis Date  . Diabetes mellitus without complication ([place])   . High risk sexual behavior 10/2007  . Hypertension   . Microcytosis   . Schizophrenia ([place])   . Substance abuse ([place])    tobacco use   Past Surgical History:  Procedure Laterality Date  . LAMINECTOMY     by Dr Rolin Barry    Current Outpatient Medications:  .  doxazosin (CARDURA) 2 MG tablet, TAKE (1) TABLET BY MOUTH ONCE DAILY., Disp: 90 tablet, Rfl: 0 .  fluPHENAZine decanoate (PROLIXIN) 25 MG/ML injection, Inject 1 mL (25 mg total) into the muscle every 14 (fourteen) days., Disp: 5 mL, Rfl: 2 .  hydroxypropyl methylcellulose / hypromellose (ISOPTO TEARS / GONIOVISC) 2.5 % ophthalmic solution, Place 2 drops into both eyes daily as needed for dry eyes., Disp: , Rfl:  .  JARDIANCE 10 MG TABS tablet, TAKE (1) TABLET BY MOUTH ONCE DAILY BEFORE BREAKFAST., Disp: 90 tablet, Rfl: 1 .  losartan (COZAAR) 100 MG tablet, TAKE (1) TABLET BY MOUTH ONCE DAILY., Disp: 90 tablet, Rfl: 1 .  pravastatin (PRAVACHOL) 40 MG tablet, TAKE (1) TABLET BY MOUTH ONCE DAILY., Disp: 90 tablet, Rfl: 1 .  VOLTAREN 1 % GEL, Apply 2 g topically 4 (four) times daily., Disp: 20 g, Rfl: 10  Current Facility-Administered Medications:  .  fluPHENAZine decanoate (PROLIXIN) injection 25 mg, 25 mg, Intramuscular, Q14 Days, Arfeen, Syed T, MD, 25 mg at 09/16/19 1201  Allergies  Allergen Reactions  . Ace Inhibitors Cough  . Atorvastatin Other (See Comments)   Dizzy / Faint  . Chlorpromazine Hcl Other (See Comments)    Passed out   . Simvastatin Other (See Comments)    Dizzy / Faint   Review of Systems Objective:   Vitals:   09/23/19 1348  BP: 121/72  Pulse: 85  Resp: 16    General: Well developed, nourished, in no acute distress, alert and oriented x3   Dermatological: Skin is warm, dry and supple bilateral. Nails x 10 are well maintained; remaining integument appears unremarkable at this time. There are no open sores, no preulcerative lesions, no rash or signs of infection present.  Vascular: Dorsalis Pedis artery and Posterior Tibial artery pedal pulses are 2/4 bilateral with immedate capillary fill time. Pedal hair growth present. No varicosities and no lower extremity edema present bilateral.   Neruologic: Grossly intact via light touch bilateral. Vibratory intact via tuning fork bilateral. Protective threshold with Semmes Wienstein monofilament intact to all pedal sites bilateral. Patellar and Achilles deep tendon reflexes 2+ bilateral. No Babinski or clonus noted bilateral.   Musculoskeletal: No gross boney pedal deformities bilateral. No pain, crepitus, or limitation noted with foot and ankle range of motion bilateral. Muscular strength 5/5 in all groups tested bilateral.  Pain to palpation medial calcaneal tubercles bilateral.  Gait: Unassisted, Nonantalgic.    Radiographs:  Radiographs taken today demonstrate osseously mature individual soft tissue increase in density at the plantar  fascial calcaneal insertion sites.  Mild pes planus is noted bilateral no acute findings are noted.  Assessment & Plan:   Assessment: Plantar fasciitis and mild pes planus bilateral.  Plan: Discussed etiology pathology conservative surgical therapies at this point went ahead and injected bilateral heels today 20 mg Kenalog 5 mg Marcaine point of maximal tenderness.  Tolerated procedure well without complications.  Placed him in a plantar fascial  brace bilaterally discussed appropriate shoe gear stretching exercise ice therapy and shoe gear modifications.     Pleas Carneal T. Davis, [place]

## 2019-09-30 ENCOUNTER — Ambulatory Visit (HOSPITAL_COMMUNITY): Payer: Medicare Other

## 2019-10-01 ENCOUNTER — Encounter (HOSPITAL_COMMUNITY): Payer: Self-pay

## 2019-10-01 ENCOUNTER — Ambulatory Visit (INDEPENDENT_AMBULATORY_CARE_PROVIDER_SITE_OTHER): Payer: Medicare Other

## 2019-10-01 ENCOUNTER — Other Ambulatory Visit: Payer: Self-pay

## 2019-10-01 DIAGNOSIS — F200 Paranoid schizophrenia: Secondary | ICD-10-CM

## 2019-10-01 NOTE — Progress Notes (Signed)
Pt in for due injection of Prolixin 125/5. Pt pleasant and cooperative on approach. Injection, Prolixin 1 ml, was given in left upper outer quadrant without c/o. Pt to return in two weeks for next due injection. Pt voiced no c/o or concerns.

## 2019-10-14 ENCOUNTER — Other Ambulatory Visit: Payer: Self-pay | Admitting: Internal Medicine

## 2019-10-14 DIAGNOSIS — I10 Essential (primary) hypertension: Secondary | ICD-10-CM

## 2019-10-15 ENCOUNTER — Other Ambulatory Visit: Payer: Self-pay

## 2019-10-15 ENCOUNTER — Ambulatory Visit (INDEPENDENT_AMBULATORY_CARE_PROVIDER_SITE_OTHER): Payer: Medicare Other | Admitting: *Deleted

## 2019-10-15 VITALS — BP 161/84 | HR 85 | Wt 233.0 lb

## 2019-10-15 DIAGNOSIS — F200 Paranoid schizophrenia: Secondary | ICD-10-CM | POA: Diagnosis not present

## 2019-10-15 NOTE — Progress Notes (Signed)
Pt in clinic today for due Prolixin 25mg  injection. Pt cooperative if not a little irritable and hyper religious. Injection given in left upper outer quadrant without c/o. Pt to return to clinic in two weeks for next due injection.

## 2019-10-17 ENCOUNTER — Encounter: Payer: Self-pay | Admitting: *Deleted

## 2019-10-29 ENCOUNTER — Ambulatory Visit (INDEPENDENT_AMBULATORY_CARE_PROVIDER_SITE_OTHER): Payer: Medicare Other | Admitting: *Deleted

## 2019-10-29 ENCOUNTER — Other Ambulatory Visit: Payer: Self-pay

## 2019-10-29 DIAGNOSIS — F200 Paranoid schizophrenia: Secondary | ICD-10-CM

## 2019-10-29 NOTE — Progress Notes (Signed)
Pt presents today for due Prolixin 25mg  injection. Pt presents ruminating on "the devil" and made a statement about being dead, or better off dead, which pt laughed about after writer questioned comment. Injection given in right upper outer quadrant without c/o. Pt to return in 2 weeks for next due injection.

## 2019-11-10 ENCOUNTER — Encounter: Payer: Self-pay | Admitting: Internal Medicine

## 2019-11-11 ENCOUNTER — Other Ambulatory Visit: Payer: Self-pay

## 2019-11-11 ENCOUNTER — Ambulatory Visit (INDEPENDENT_AMBULATORY_CARE_PROVIDER_SITE_OTHER): Payer: Medicare Other | Admitting: Internal Medicine

## 2019-11-11 VITALS — BP 141/82 | HR 76 | Temp 98.6°F | Ht 72.0 in | Wt 237.7 lb

## 2019-11-11 DIAGNOSIS — Z0000 Encounter for general adult medical examination without abnormal findings: Secondary | ICD-10-CM

## 2019-11-11 DIAGNOSIS — I10 Essential (primary) hypertension: Secondary | ICD-10-CM | POA: Diagnosis not present

## 2019-11-11 DIAGNOSIS — R351 Nocturia: Secondary | ICD-10-CM

## 2019-11-11 DIAGNOSIS — F17200 Nicotine dependence, unspecified, uncomplicated: Secondary | ICD-10-CM

## 2019-11-11 DIAGNOSIS — M722 Plantar fascial fibromatosis: Secondary | ICD-10-CM

## 2019-11-11 DIAGNOSIS — E1169 Type 2 diabetes mellitus with other specified complication: Secondary | ICD-10-CM

## 2019-11-11 DIAGNOSIS — M25511 Pain in right shoulder: Secondary | ICD-10-CM

## 2019-11-11 DIAGNOSIS — N401 Enlarged prostate with lower urinary tract symptoms: Secondary | ICD-10-CM

## 2019-11-11 DIAGNOSIS — F17210 Nicotine dependence, cigarettes, uncomplicated: Secondary | ICD-10-CM

## 2019-11-11 DIAGNOSIS — F200 Paranoid schizophrenia: Secondary | ICD-10-CM

## 2019-11-11 DIAGNOSIS — Z7984 Long term (current) use of oral hypoglycemic drugs: Secondary | ICD-10-CM

## 2019-11-11 LAB — POCT GLYCOSYLATED HEMOGLOBIN (HGB A1C): Hemoglobin A1C: 7.6 % — AB (ref 4.0–5.6)

## 2019-11-11 LAB — GLUCOSE, CAPILLARY: Glucose-Capillary: 140 mg/dL — ABNORMAL HIGH (ref 70–99)

## 2019-11-11 NOTE — Progress Notes (Signed)
   Subjective:    Patient ID: Wayne Schmidt, male    DOB: May 14, 1956, 64 y.o.   MRN: [id number]  HPI  I have seen and examined this patient.  Patient is here for routine follow-up of his hypertension and diabetes.  Patient states that he is compliant with all his medications.  He does complain of shoulder pain over the last 4 months which is slowly improving.  He also complains of urinating frequently.  He was also recently diagnosed with plantar fasciitis and received steroid injections in his feet.  He states that this is slowly improving.  He denies any other complaints at this time.  Review of Systems  Constitutional: Negative.   HENT: Negative.   Respiratory: Negative.   Cardiovascular: Negative.   Gastrointestinal: Negative.   Musculoskeletal: Positive for arthralgias.       Still complains of pain in the sole of his right foot secondary to his plantar fasciitis but this is slowly improving  Patient also complains of pain in his right shoulder which is slowly improving.    Neurological: Negative.   Psychiatric/Behavioral: Negative.        Objective:   Physical Exam Constitutional:      Appearance: Normal appearance.  HENT:     Head: Normocephalic and atraumatic.  Cardiovascular:     Rate and Rhythm: Normal rate and regular rhythm.     Heart sounds: Normal heart sounds.  Pulmonary:     Breath sounds: Normal breath sounds. No wheezing or rales.  Abdominal:     General: Bowel sounds are normal. There is no distension.     Palpations: Abdomen is soft.     Tenderness: There is no abdominal tenderness.  Musculoskeletal:        General: No swelling or tenderness.  Neurological:     General: No focal deficit present.     Mental Status: He is alert and oriented to person, place, and time.  Psychiatric:        Mood and Affect: Mood normal.           Assessment & Plan:  Please see problem based charting for assessment and plan:

## 2019-11-11 NOTE — Assessment & Plan Note (Signed)
-  Patient states that he is urinating frequently and states that this secondary to his fluphenazine -I explained to him this is likely secondary to his prostate but he does not want to go up on his dose of doxazosin or restart Flomax at this time -We will follow-up with him at his next visit

## 2019-11-11 NOTE — Assessment & Plan Note (Signed)
-  We discussed the importance of getting the Covid vaccine.  He states that he is having some people look into this and still has an attempt of getting this -We will discuss this again at his next visit

## 2019-11-11 NOTE — Assessment & Plan Note (Signed)
-  This problem is chronic and stable -Patient follows up with psychiatry as an outpatient for this -Continue with fluphenazine per psychiatry -No further work-up at this time

## 2019-11-11 NOTE — Patient Instructions (Signed)
-  It was a pleasure seeing you today -Your A1c has improved to 7.6.  If your A1c remains elevated at your next visit we should consider trying to increase your dose of Jardiance -If you are still having issues with frequent urination we should consider trying to start you back on Flomax or increasing the dose of your doxazosin -Please follow-up with your foot doctor for your plantar fasciitis -Please try to decrease the amount of cigarettes that you are smoking.  We should talk about this more at your next visit -Please try and get the Covid vaccine if possible -Please call if you have any questions or concerns or if you have any issues getting her medications

## 2019-11-11 NOTE — Assessment & Plan Note (Signed)
Lab Results  Component Value Date   HGBA1C 7.6 (A) 11/11/2019   HGBA1C 8.0 (A) 07/29/2019   HGBA1C 8.4 (A) 04/15/2019     Assessment: Diabetes control:  Fair Progress toward A1C goal:   Improved Comments: Patient is compliant with Jardiance 10 mg daily  Plan: Medications:  continue current medications Home glucose monitoring: Frequency:   Timing:   Instruction/counseling given: no instruction/counseling  Educational resources provided:   Self management tools provided:   Other plans: We discussed possibly going up on his Jardiance but patient states that he believes his blood sugars are still mildly elevated secondary to his fluphenazine.  He would like to go down on his dose of fluphenazine and I will discuss this with his psychiatrist.  He would like to maintain himself on his current dose of medication.

## 2019-11-11 NOTE — Assessment & Plan Note (Signed)
-  Patient states that he is currently smoking over 1 pack a day and that he is doing this secondary to being aggravated with his current dose of fluphenazine -We discussed the importance of trying to cut back on his smoking and he states that he would try to do this -We will revisit this again at his next visit

## 2019-11-11 NOTE — Assessment & Plan Note (Signed)
-  Patient states that over the last 3 to 4 months he has noted pain in his right shoulder and difficulty moving his right upper extremity secondary to pain -He states that the pain is slowly improved and that now he is able to move his right upper extremity more -No tenderness to palpation over right shoulder or over deltoid region -Patient able to do empty beer can test as well as crossarm test -Patient states that he does have occasional pain in his neck going down to the middle of his right arm but this is improving as well -Given that the patient's pain is improving and his range of motion is currently preserved we will monitor him for now -Patient given instructions that if his pain worsens or persists to follow-up with [place] for further evaluation

## 2019-11-11 NOTE — Assessment & Plan Note (Signed)
BP Readings from Last 3 Encounters:  11/11/19 (!) 141/82  10/29/19 (!) 150/88  10/15/19 (!) 161/84    Lab Results  Component Value Date   NA 141 07/29/2019   K 4.8 07/29/2019   CREATININE 1.16 07/29/2019    Assessment: Blood pressure control:  Fair Progress toward BP goal:   Improved  Comments: Patient is compliant with losartan 100 mg and doxazosin 2 mg daily.  He states that he missed a couple of doses but that he is not taking his medication.  He does not want to go up on his medication at this time.  Plan: Medications:  continue current medications Educational resources provided:   Self management tools provided:   Other plans: We will recheck BMP at his next visit

## 2019-11-11 NOTE — Assessment & Plan Note (Signed)
-  Patient was seen by podiatry recently for worsening pain in both his heels secondary to plantar fasciitis -He had surgery infections done in his heels at that time -Patient still complains of some mild pain in his right foot but improved since March -He has an appointment to follow with podiatry on May 25 -No further work-up at this time

## 2019-11-12 ENCOUNTER — Ambulatory Visit (INDEPENDENT_AMBULATORY_CARE_PROVIDER_SITE_OTHER): Payer: Medicare Other | Admitting: *Deleted

## 2019-11-12 ENCOUNTER — Telehealth (INDEPENDENT_AMBULATORY_CARE_PROVIDER_SITE_OTHER): Payer: Medicare Other | Admitting: Psychiatry

## 2019-11-12 ENCOUNTER — Telehealth (HOSPITAL_COMMUNITY): Payer: Medicare Other | Admitting: Psychiatry

## 2019-11-12 ENCOUNTER — Encounter (HOSPITAL_COMMUNITY): Payer: Self-pay | Admitting: Psychiatry

## 2019-11-12 DIAGNOSIS — G2401 Drug induced subacute dyskinesia: Secondary | ICD-10-CM

## 2019-11-12 DIAGNOSIS — F200 Paranoid schizophrenia: Secondary | ICD-10-CM

## 2019-11-12 MED ORDER — FLUPHENAZINE DECANOATE 25 MG/ML IJ SOLN: 25.0000 mg | INTRAMUSCULAR | 2 refills | Status: DC

## 2019-11-12 NOTE — Progress Notes (Signed)
Virtual Visit via Telephone Note  I connected with Wayne Schmidt on 11/12/19 at  1:20 PM EDT by telephone and verified that I am speaking with the correct person using two identifiers.   I discussed the limitations, risks, security and privacy concerns of performing an evaluation and management service by telephone and the availability of in person appointments. I also discussed with the patient that there may be a patient responsible charge related to this service. The patient expressed understanding and agreed to proceed.   History of Present Illness: Patient was evaluated by phone session.  He is taking Prolixin injection every 2 weeks but today he is somewhat irritable and moderately know if he can cut down his medication.  When I asked why he replied that he know his body well and he knew the doses that he does not need to take higher doses.  He did not give any specific reason why he want to cut down the dose.  He is consistent with injection and did not missed any dose.  I explained that reducing the dose may cause a relapse of his illness and since he has been stable for a long time I encouraged him to stay on the same medication.  After some discussion he agree with the plan.  He do not offer any other concerns side effects or symptoms.  He is sleeping good.  He continues to walk 10 to 15 minutes 2-3 times a week if weather permits.  He does appear to be religiously preoccupied but denies any hallucination, suicidal thoughts.  He has mild tremors but does not interfere in his daily activities.  Denies any crying spells or any feeling of hopelessness.  His appetite is okay.  Recently he had a blood work and his hemoglobin A1c is further improved from the past.  He is not interested in therapy.   Past Psychiatric History:Reviewed. H/Oschizophreniawith multiple inpatient treatment.H/O of hallucination, paranoia and suicidal attempt by overdose. [facility]. TookHaldol and Thorazine  buthad side effects.OnProlixin injection for many years.Tried hydroxyzine and Ingreeza but could not tolerate.  Recent Results (from the past 2160 hour(s))  Glucose, capillary     Status: Abnormal   Collection Time: 11/11/19  9:32 AM  Result Value Ref Range   Glucose-Capillary 140 (H) 70 - 99 mg/dL    Comment: Glucose reference range applies only to samples taken after fasting for at least 8 hours.  POC Hbg A1C     Status: Abnormal   Collection Time: 11/11/19  9:46 AM  Result Value Ref Range   Hemoglobin A1C 7.6 (A) 4.0 - 5.6 %   HbA1c POC (<> result, manual entry)     HbA1c, POC (prediabetic range)     HbA1c, POC (controlled diabetic range)        Psychiatric Specialty Exam: Physical Exam  Review of Systems  There were no vitals taken for this visit.There is no height or weight on file to calculate BMI.  General Appearance: NA  Eye Contact:  NA  Speech:  Slow  Volume:  Normal  Mood:  Irritable  Affect:  NA  Thought Process:  Descriptions of Associations: Circumstantial  Orientation:  Full (Time, Place, and Person)  Thought Content:  Paranoid Ideation and Rumination  Suicidal Thoughts:  No  Homicidal Thoughts:  No  Memory:  Immediate;   Fair Recent;   Fair Remote;   Fair  Judgement:  Fair  Insight:  Fair  Psychomotor Activity:  mild tremors  Concentration:  Concentration: Fair and Attention Span: Fair  Recall:  [employer] of Knowledge:  Fair  Language:  Fair  Akathisia:  NA  Handed:  Right  AIMS (if indicated):     Assets:  Desire for Improvement Housing  ADL's:  Intact  Cognition:  WNL  Sleep:   ok      Assessment and Plan: Schizophrenia chronic paranoid type.  Tardive dyskinesia.  I spent talking the patient discussing about long-term prognosis, medication side effects and risks of reducing the dose of the medication.  I also reviewed blood work results with him.  He agreed that he like to keep on the same dose of medication as does not want to get  sick or to have symptoms coming back.  I offered therapy but he refused.  I will continue Prolixin 25 mg intramuscular every 2 weeks.  He has chronic tremors but does not interfere in his daily activities.  Follow-up in 3 months.  I recommend to call us back if is any question or any concerns.  Follow Up Instructions:    I discussed the assessment and treatment plan with the patient. The patient was provided an opportunity to ask questions and all were answered. The patient agreed with the plan and demonstrated an understanding of the instructions.   The patient was advised to call back or seek an in-person evaluation if the symptoms worsen or if the condition fails to improve as anticipated.  I provided 20 minutes of non-face-to-face time during this encounter.   [name], MD

## 2019-11-12 NOTE — Progress Notes (Signed)
Pt presents to clinic today for due Prolixin 77ms injection. Pt mood irritable but cooperative. Pt c/o medications and getting "them straight". Injection given in left upper outer quadrant without c/o. Pt to return in approximately two weeks for next injection.

## 2019-11-13 ENCOUNTER — Telehealth (HOSPITAL_COMMUNITY): Payer: Medicare Other | Admitting: Psychiatry

## 2019-11-18 ENCOUNTER — Encounter: Payer: Medicare Other | Admitting: Internal Medicine

## 2019-11-25 ENCOUNTER — Ambulatory Visit (INDEPENDENT_AMBULATORY_CARE_PROVIDER_SITE_OTHER): Payer: Medicare Other | Admitting: Podiatry

## 2019-11-25 ENCOUNTER — Other Ambulatory Visit: Payer: Self-pay

## 2019-11-25 DIAGNOSIS — M722 Plantar fascial fibromatosis: Secondary | ICD-10-CM

## 2019-11-26 ENCOUNTER — Ambulatory Visit (INDEPENDENT_AMBULATORY_CARE_PROVIDER_SITE_OTHER): Payer: Medicare Other

## 2019-11-26 ENCOUNTER — Other Ambulatory Visit: Payer: Self-pay

## 2019-11-26 DIAGNOSIS — F200 Paranoid schizophrenia: Secondary | ICD-10-CM

## 2019-11-26 NOTE — Progress Notes (Signed)
He presents today for follow-up of his bilateral plantar fasciitis.  He states that he is doing much better and not having any problems.  Objective: Vital signs are stable alert and oriented x3.  He has no pain on palpation mid calcaneal tubercles bilaterally.  Assessment: Well-healing plantar fasciitis bilateral.  Plan: Follow-up with him as needed.

## 2019-11-26 NOTE — Progress Notes (Signed)
Pt presents to clinic today for due Prolixin 33ms injection. Pt mood pleasant and cooperative. Injection given in right upper outer quadrant without c/o. Pt. wasn't given transportation form for [place] to fill out as we usually do. Pt stated he will check on that. Pt to return in approximately two weeks for next injection.

## 2019-11-27 ENCOUNTER — Encounter: Payer: Self-pay | Admitting: *Deleted

## 2019-11-27 NOTE — Progress Notes (Signed)

## 2019-11-27 NOTE — Progress Notes (Signed)
Things That May Be Affecting Your Health:  Alcohol  Hearing loss  Pain    Depression  Home Safety  Sexual Health   Diabetes  Lack of physical activity  Stress   Difficulty with daily activities  Loneliness  Tiredness   Drug use  Medicines x Tobacco use  x Falls  Motor Vehicle Safety  Weight   Food choices  Oral Health  Other    YOUR PERSONALIZED HEALTH PLAN : 1. Schedule your next subsequent Medicare Wellness visit in one year 2. Attend all of your regular appointments to address your medical issues 3. Complete the preventative screenings and services   Annual Wellness Visit   Medicare Covered Preventative Screenings and [place] Men and Women Who How Often Need? Date of Last Service Action  Abdominal Aortic Aneurysm Adults with AAA risk factors Once x    Alcohol Misuse and Counseling All Adults Screening once a year if no alcohol misuse. Counseling up to 4 face to face sessions.     Bone Density Measurement  Adults at risk for osteoporosis Once every 2 yrs     Lipid Panel Z13.6 All adults without CV disease Once every 5 yrs     Colorectal Cancer   Stool sample or  Colonoscopy All adults 47 and older   Once every year  Every 10 years     Depression All Adults Once a year  Today   Diabetes Screening Blood glucose, post glucose load, or GTT Z13.1  All adults at risk  Pre-diabetics  Once per year  Twice per year     Diabetes  Self-Management Training All adults Diabetics 10 hrs first year; 2 hours subsequent years. Requires Copay     Glaucoma  Diabetics  Family history of glaucoma  African Americans 77 yrs +  Hispanic Americans 28 yrs + Annually - requires coppay     Hepatitis C Z72.89 or F19.20  High Risk for HCV  Born between 1945 and 1965  Annually  Once     HIV Z11.4 All adults based on risk  Annually btw ages 73 & 79 regardless of risk  Annually > 65 yrs if at increased risk     Lung Cancer Screening Asymptomatic adults aged  75-77 with 30 pack yr history and current smoker OR quit within the last 15 yrs Annually Must have counseling and shared decision making documentation before first screen x    Medical Nutrition Therapy Adults with   Diabetes  Renal disease  Kidney transplant within past 3 yrs 3 hours first year; 2 hours subsequent years     Obesity and Counseling All adults Screening once a year Counseling if BMI 30 or higher  Today   Tobacco Use Counseling Adults who use tobacco  Up to 8 visits in one year x    Vaccines Z23  Hepatitis B  Influenza   Pneumonia  Adults   Once  Once every flu season  Two different vaccines separated by one year X Pcv 13    Next Annual Wellness Visit People with Medicare Every year  Today     Services & Screenings Women Who How Often Need  Date of Last Service Action  Mammogram  Z12.31 Women over 22 One baseline ages 2-39. Annually ager 40 yrs+     Pap tests All women Annually if high risk. Every 2 yrs for normal risk women     Screening for cervical cancer with   Pap (Z01.419 nl or Z01.411abnl) &  HPV Z11.51 Women aged 51 to 43 Once every 5 yrs     Screening pelvic and breast exams All women Annually if high risk. Every 2 yrs for normal risk women     Sexually Transmitted Diseases  Chlamydia  Gonorrhea  Syphilis All at risk adults Annually for non pregnant females at increased risk         [place] Men Who How Ofter Need  Date of Last Service Action  Prostate Cancer - DRE & PSA Men over 50 Annually.  DRE might require a copay.     Sexually Transmitted Diseases  Syphilis All at risk adults Annually for men at increased risk

## 2019-12-10 ENCOUNTER — Other Ambulatory Visit: Payer: Self-pay

## 2019-12-10 ENCOUNTER — Ambulatory Visit (INDEPENDENT_AMBULATORY_CARE_PROVIDER_SITE_OTHER): Payer: Medicare Other

## 2019-12-10 DIAGNOSIS — F200 Paranoid schizophrenia: Secondary | ICD-10-CM

## 2019-12-10 MED ORDER — FLUPHENAZINE DECANOATE 25 MG/ML IJ SOLN
25.0000 mg | Freq: Once | INTRAMUSCULAR | Status: AC
  Administered 2019-12-10: 25 mg via INTRAMUSCULAR

## 2019-12-10 NOTE — Progress Notes (Signed)
Pt presents to clinic today for due Prolixin 25mg  injection. Pt mood pleasant and cooperative. Injection given in left upper outer quadrant without c/o. Patient is out of his Fluphenazine. I had enough for today's injection and informed patient I might be able to squeeze on more ml out of the vial we have but not certain. Patient stated he would check on it and that he should be getting more within the next couple days. Pt to return in approximately two weeks for next injection.

## 2019-12-24 ENCOUNTER — Ambulatory Visit (INDEPENDENT_AMBULATORY_CARE_PROVIDER_SITE_OTHER): Payer: Medicare Other | Admitting: *Deleted

## 2019-12-24 ENCOUNTER — Other Ambulatory Visit: Payer: Self-pay

## 2019-12-24 DIAGNOSIS — F200 Paranoid schizophrenia: Secondary | ICD-10-CM | POA: Diagnosis not present

## 2019-12-24 DIAGNOSIS — G2401 Drug induced subacute dyskinesia: Secondary | ICD-10-CM

## 2019-12-24 MED ORDER — FLUPHENAZINE DECANOATE 25 MG/ML IJ SOLN
25.0000 mg | Freq: Once | INTRAMUSCULAR | Status: AC
  Administered 2019-12-24: 25 mg via INTRAMUSCULAR

## 2019-12-24 NOTE — Progress Notes (Signed)
Pt presents to clinic today for bi-weekly injection of Prolixin Dec 25mg . Pt pleasant and cooperative on approach. Pt verbalized no c/o. Injection given in right upper outer quadrant w/o c/o. VSS. Pt to return in 2 weeks.

## 2020-01-07 ENCOUNTER — Other Ambulatory Visit: Payer: Self-pay

## 2020-01-07 ENCOUNTER — Ambulatory Visit (INDEPENDENT_AMBULATORY_CARE_PROVIDER_SITE_OTHER): Payer: Medicare Other | Admitting: *Deleted

## 2020-01-07 DIAGNOSIS — F200 Paranoid schizophrenia: Secondary | ICD-10-CM

## 2020-01-07 MED ORDER — FLUPHENAZINE DECANOATE 25 MG/ML IJ SOLN
25.0000 mg | Freq: Once | INTRAMUSCULAR | Status: AC
  Administered 2020-01-07: 25 mg via INTRAMUSCULAR

## 2020-01-07 NOTE — Progress Notes (Signed)
Pt presents to clinic today for due injection of Prolixin 25mg . Pt pleasant and cooperative on approach. Injection given in left upper outer quadrant w/o c/o. Pt to return in two weeks for next due injection.

## 2020-01-21 ENCOUNTER — Ambulatory Visit (INDEPENDENT_AMBULATORY_CARE_PROVIDER_SITE_OTHER): Payer: Medicare Other

## 2020-01-21 ENCOUNTER — Other Ambulatory Visit: Payer: Self-pay

## 2020-01-21 ENCOUNTER — Encounter (HOSPITAL_COMMUNITY): Payer: Self-pay

## 2020-01-21 DIAGNOSIS — F200 Paranoid schizophrenia: Secondary | ICD-10-CM | POA: Diagnosis not present

## 2020-01-21 MED ORDER — FLUPHENAZINE DECANOATE 25 MG/ML IJ SOLN
25.0000 mg | Freq: Once | INTRAMUSCULAR | Status: AC
  Administered 2020-01-21: 25 mg via INTRAMUSCULAR

## 2020-01-21 NOTE — Progress Notes (Signed)
Pt presents to clinic today for due Prolixin 25mginjection. Pt moodpleasant andcooperative. Injection given inrightupper outer quadrant without c/o.Pt to return in approximately two weeks for next injection. 

## 2020-02-05 ENCOUNTER — Other Ambulatory Visit: Payer: Self-pay

## 2020-02-05 ENCOUNTER — Ambulatory Visit (INDEPENDENT_AMBULATORY_CARE_PROVIDER_SITE_OTHER): Payer: Medicare Other

## 2020-02-05 ENCOUNTER — Encounter (HOSPITAL_COMMUNITY): Payer: Self-pay

## 2020-02-05 DIAGNOSIS — F200 Paranoid schizophrenia: Secondary | ICD-10-CM | POA: Diagnosis not present

## 2020-02-05 MED ORDER — FLUPHENAZINE DECANOATE 25 MG/ML IJ SOLN
25.0000 mg | Freq: Once | INTRAMUSCULAR | Status: AC
  Administered 2020-02-05: 25 mg via INTRAMUSCULAR

## 2020-02-05 NOTE — Patient Instructions (Signed)
Pt presents to clinic today for due Prolixin 25mg injection. Pt mood pleasant and cooperative. Injection given in left upper outer quadrant without c/o. Pt to return in approximately two weeks for next injection.  °

## 2020-02-12 ENCOUNTER — Telehealth (INDEPENDENT_AMBULATORY_CARE_PROVIDER_SITE_OTHER): Payer: Medicare Other | Admitting: Psychiatry

## 2020-02-12 ENCOUNTER — Encounter (HOSPITAL_COMMUNITY): Payer: Self-pay | Admitting: Psychiatry

## 2020-02-12 ENCOUNTER — Other Ambulatory Visit: Payer: Self-pay

## 2020-02-12 DIAGNOSIS — G2401 Drug induced subacute dyskinesia: Secondary | ICD-10-CM

## 2020-02-12 DIAGNOSIS — F200 Paranoid schizophrenia: Secondary | ICD-10-CM

## 2020-02-12 MED ORDER — FLUPHENAZINE DECANOATE 25 MG/ML IJ SOLN: 25.0000 mg | INTRAMUSCULAR | 2 refills | Status: DC

## 2020-02-12 NOTE — Progress Notes (Signed)
Virtual Visit via Telephone Note  I connected with Wayne Schmidt on 02/12/20 at 11:00 AM EDT by telephone and verified that I am speaking with the correct person using two identifiers.  Location: Patient: home Provider: home office   I discussed the limitations, risks, security and privacy concerns of performing an evaluation and management service by telephone and the availability of in person appointments. I also discussed with the patient that there may be a patient responsible charge related to this service. The patient expressed understanding and agreed to proceed.   History of Present Illness: Patient is evaluated by phone session.  He remains somewhat irritable and upset because of the dose of medication.  He has been insisting to lower the dose of the medication but did not specify a very good reason.  He used to take 50 mg intramuscular which we have slowly and gradually cut down to 25.  I explained reducing further dose of the Prolixin may cause worsening of the symptoms.  Patient told that he believe in God and it will be a new issue.  Patient sometime denied that he had any psychiatric illness but also realized that he needs something to calm his nerves.  He continues to have paranoia, ruminative thoughts and recently preoccupied.  Though he appears irritable but denies any recent anger, severe mood swings or suicidal thoughts.  Even though he does not like taking medication but is still consistent every 2 weeks to receive injection.  He does go outside 2-3 times a week for a walk.  His weight is stable.  He has tremors and diagnosed with TD but does not want to take any medication.  Patient told he does not of interfere with his daily activities.  He denies drinking or using any illegal substances.  He is not interested in therapy.   Past Psychiatric History:Reviewed. H/Oschizophreniawith multiple inpatient treatment.H/O of hallucination, paranoia and suicidal attempt by overdose.  [facility]TookHaldol and Thorazine buthad side effects.OnProlixin injection for many years.Tried hydroxyzineand Ingreezabut could not tolerate.  Psychiatric Specialty Exam: Physical Exam  Review of Systems  Weight 236 lb (107 kg).There is no height or weight on file to calculate BMI.  General Appearance: NA  Eye Contact:  NA  Speech:  Slow  Volume:  Decreased  Mood:  Irritable  Affect:  NA  Thought Process:  Descriptions of Associations: Circumstantial  Orientation:  Full (Time, Place, and Person)  Thought Content:  Paranoid Ideation, Rumination and preocupied with religion  Suicidal Thoughts:  No  Homicidal Thoughts:  No  Memory:  Immediate;   Fair Recent;   Fair Remote;   Fair  Judgement:  Fair  Insight:  Shallow  Psychomotor Activity:  Tremor  Concentration:  Concentration: Fair and Attention Span: Fair  Recall:  [employer] of Knowledge:  Fair  Language:  Fair  Akathisia:  No  Handed:  Right  AIMS (if indicated):     Assets:  Desire for Improvement Housing  ADL's:  Intact  Cognition:  WNL  Sleep:         Assessment and Plan: Schizophrenia chronic paranoid type.  TD.  I talked to him about reducing the dose of medication causing relapse of symptoms.  After some discussion he agreed that he will keep the same dose of injection.  I also offered him that if he wants to second opinion we can refer him to other places but patient refused.  I also offered therapy but he refused.  He does not  show any interest in taking any other medication including for his tardive dyskinesia.  We will continue Prolixin 25 mg intramuscular every 2 weeks.  Discussed medication side effects and benefits.  Recommended to call us back if is any question or any concern.  Follow-up in 3 months.  Follow Up Instructions:    I discussed the assessment and treatment plan with the patient. The patient was provided an opportunity to ask questions and all were answered. The  patient agreed with the plan and demonstrated an understanding of the instructions.   The patient was advised to call back or seek an in-person evaluation if the symptoms worsen or if the condition fails to improve as anticipated.  I provided 20 minutes of non-face-to-face time during this encounter.   [name], MD

## 2020-02-19 ENCOUNTER — Other Ambulatory Visit: Payer: Self-pay

## 2020-02-19 ENCOUNTER — Ambulatory Visit (INDEPENDENT_AMBULATORY_CARE_PROVIDER_SITE_OTHER): Payer: Medicare Other | Admitting: *Deleted

## 2020-02-19 DIAGNOSIS — F200 Paranoid schizophrenia: Secondary | ICD-10-CM | POA: Diagnosis not present

## 2020-02-19 MED ORDER — FLUPHENAZINE DECANOATE 25 MG/ML IJ SOLN
25.0000 mg | Freq: Once | INTRAMUSCULAR | Status: AC
  Administered 2020-02-19: 25 mg via INTRAMUSCULAR

## 2020-02-19 NOTE — Progress Notes (Signed)
Patient ID: Wayne Schmidt, male   DOB: [birth year], 64 y.o.   MRN: [id number] Pt in clinic today for due injection of Prolixin 25mg . Pt bight, cooperative on approach. Injection given in right upper outer quadrant without c/o. Pt to return in two weeks for next due injection.

## 2020-03-04 ENCOUNTER — Ambulatory Visit (INDEPENDENT_AMBULATORY_CARE_PROVIDER_SITE_OTHER): Payer: Medicare Other

## 2020-03-04 ENCOUNTER — Encounter (HOSPITAL_COMMUNITY): Payer: Self-pay

## 2020-03-04 ENCOUNTER — Other Ambulatory Visit: Payer: Self-pay

## 2020-03-04 DIAGNOSIS — F200 Paranoid schizophrenia: Secondary | ICD-10-CM

## 2020-03-04 MED ORDER — FLUPHENAZINE DECANOATE 25 MG/ML IJ SOLN
25.0000 mg | Freq: Once | INTRAMUSCULAR | Status: AC
  Administered 2020-03-04: 25 mg via INTRAMUSCULAR

## 2020-03-04 NOTE — Progress Notes (Signed)
Pt presents to clinic today for due Prolixin 25mg injection. Pt mood pleasant and cooperative. Injection given in left upper outer quadrant without c/o. Pt to return in approximately two weeks for next injection.  °

## 2020-03-09 ENCOUNTER — Encounter: Payer: Medicare Other | Admitting: Internal Medicine

## 2020-03-13 ENCOUNTER — Other Ambulatory Visit: Payer: Self-pay | Admitting: Internal Medicine

## 2020-03-13 DIAGNOSIS — E7849 Other hyperlipidemia: Secondary | ICD-10-CM

## 2020-03-13 DIAGNOSIS — E1169 Type 2 diabetes mellitus with other specified complication: Secondary | ICD-10-CM

## 2020-03-13 DIAGNOSIS — I10 Essential (primary) hypertension: Secondary | ICD-10-CM

## 2020-03-15 NOTE — Telephone Encounter (Signed)
Next appt is tomorrow with PCP.

## 2020-03-16 ENCOUNTER — Ambulatory Visit (INDEPENDENT_AMBULATORY_CARE_PROVIDER_SITE_OTHER): Payer: Medicare Other | Admitting: Internal Medicine

## 2020-03-16 ENCOUNTER — Encounter: Payer: Self-pay | Admitting: Internal Medicine

## 2020-03-16 ENCOUNTER — Other Ambulatory Visit: Payer: Self-pay

## 2020-03-16 VITALS — BP 123/83 | HR 90 | Temp 99.1°F | Ht 72.0 in | Wt 237.6 lb

## 2020-03-16 DIAGNOSIS — I10 Essential (primary) hypertension: Secondary | ICD-10-CM

## 2020-03-16 DIAGNOSIS — F17200 Nicotine dependence, unspecified, uncomplicated: Secondary | ICD-10-CM

## 2020-03-16 DIAGNOSIS — F200 Paranoid schizophrenia: Secondary | ICD-10-CM

## 2020-03-16 DIAGNOSIS — E1169 Type 2 diabetes mellitus with other specified complication: Secondary | ICD-10-CM

## 2020-03-16 DIAGNOSIS — E785 Hyperlipidemia, unspecified: Secondary | ICD-10-CM

## 2020-03-16 DIAGNOSIS — Z23 Encounter for immunization: Secondary | ICD-10-CM | POA: Diagnosis not present

## 2020-03-16 DIAGNOSIS — R351 Nocturia: Secondary | ICD-10-CM

## 2020-03-16 DIAGNOSIS — M25511 Pain in right shoulder: Secondary | ICD-10-CM

## 2020-03-16 DIAGNOSIS — Z0000 Encounter for general adult medical examination without abnormal findings: Secondary | ICD-10-CM

## 2020-03-16 DIAGNOSIS — N401 Enlarged prostate with lower urinary tract symptoms: Secondary | ICD-10-CM

## 2020-03-16 LAB — GLUCOSE, CAPILLARY: Glucose-Capillary: 146 mg/dL — ABNORMAL HIGH (ref 70–99)

## 2020-03-16 LAB — POCT GLYCOSYLATED HEMOGLOBIN (HGB A1C): Hemoglobin A1C: 7.4 % — AB (ref 4.0–5.6)

## 2020-03-16 NOTE — Patient Instructions (Signed)
-  It was a pleasure seeing you today -We will again check some blood work on you today including your kidney function, electrolytes and your cholesterol levels -Your A1c is mildly elevated at 7.4.  Our goal A1c is less than 7.  Continue to work on moderating sweets in your diet. -Your blood pressure is well controlled.  Keep up the great work! -Please continue to try and reduce the amount of cigarettes you are smoking -Please call me if you have any questions or concerns or if you need any refills on medications -I will attempt to call you tomorrow with the results of your blood test from today

## 2020-03-16 NOTE — Assessment & Plan Note (Signed)
-  Flu shot given today -Patient will need pneumonia vaccine on his next visit -Patient states that he received both doses of the Pfizer vaccine from the [place] at the [facility]

## 2020-03-16 NOTE — Assessment & Plan Note (Signed)
-  Patient still complains of intermittent right shoulder pain -Patient states that the pain is shooting in nature and extends from his right shoulder to his elbow -No tenderness with palpable over his right shoulder deltoid region -States the pain is intermittent and tolerable -Patient also states he occasionally has pain over his right neck and back as well -Patient has full range of motion without any pain -We will follow-up with this at his next visit and consider further work-up if it persists

## 2020-03-16 NOTE — Assessment & Plan Note (Signed)
Lab Results  Component Value Date   HGBA1C 7.4 (A) 03/16/2020   HGBA1C 7.6 (A) 11/11/2019   HGBA1C 8.0 (A) 07/29/2019     Assessment: Diabetes control:  Fair Progress toward A1C goal:   Improved Comments: Patient is compliant with Jardiance 10 mg daily  Plan: Medications:  continue current medications Home glucose monitoring: Frequency:   Timing:   Instruction/counseling given: discussed diet Educational resources provided:   Self management tools provided:   Other plans: We will check BMP today.  We discussed the importance of moderating sweets in his diet.

## 2020-03-16 NOTE — Assessment & Plan Note (Signed)
-  Patient states that he has been trying to keep hydrated and has been urinating frequently secondary to this -He does wake up in the middle of the night to urinate -He is on low-dose doxazosin.  We have discussed going up on doxazosin or restarting Flomax in the past but he has refused -We will readdress this at his next visit

## 2020-03-16 NOTE — Assessment & Plan Note (Signed)
-  This problem is chronic and stable -We will continue pravastatin 40 mg daily -We will check lipid panel today.  Patient was noted to have an LDL of 71 on his last lipid panel -Would consider changing to high intensity statin given his diabetes and hypertension but he has had difficulty with simvastatin and atorvastatin in the past and is tolerating pravastatin well and his LDL has remained low and it may be more prudent to leave him on pravastatin at this time

## 2020-03-16 NOTE — Assessment & Plan Note (Signed)
-  Patient states that he has tried to cut down on his cigarette smoking and is smoking less than 1 pack/day -We discussed importance of cutting back on his smoking and continuing to work on reducing the number of cigarettes that he smokes a day -We will discuss this with him again on his next visit

## 2020-03-16 NOTE — Assessment & Plan Note (Signed)
BP Readings from Last 3 Encounters:  03/16/20 123/83  03/04/20 (!) 157/83  02/19/20 (!) 154/95    Lab Results  Component Value Date   NA 141 07/29/2019   K 4.8 07/29/2019   CREATININE 1.16 07/29/2019    Assessment: Blood pressure control:  Well-controlled Progress toward BP goal:   At goal Comments: Patient is compliant with losartan 100 mg daily as well as Cardura 2 mg daily  Plan: Medications:  continue current medications Educational resources provided:   Self management tools provided:   Other plans: We will check BMP today

## 2020-03-16 NOTE — Progress Notes (Signed)
   Subjective:    Patient ID: Wayne Schmidt, male    DOB: 28-Sep-1955, 64 y.o.   MRN: [id number]  HPI  I have seen and examined this patient.  Patient is here for routine follow-up of his hypertension and diabetes.  Patient states that he is compliant with his medications but feels that he is taking too many medications and at too high a dose.  He does complain of some intermittent lightheadedness over the last couple of months which results in a couple of seconds.  Also complained of some intermittent shooting pain in his right shoulder.  Review of Systems  Constitutional: Negative.   HENT: Negative.   Respiratory: Negative.   Cardiovascular: Negative.   Gastrointestinal: Negative.   Musculoskeletal: Positive for arthralgias.       Complains of intermittent shooting pain over his right shoulder and right arm  Skin: Negative.   Neurological: Positive for light-headedness.  Psychiatric/Behavioral:       Does have tangential thinking       Objective:   Physical Exam Constitutional:      Appearance: Normal appearance.  HENT:     Head: Normocephalic and atraumatic.  Cardiovascular:     Rate and Rhythm: Normal rate and regular rhythm.     Heart sounds: Normal heart sounds.  Pulmonary:     Breath sounds: Normal breath sounds. No wheezing or rales.  Abdominal:     General: Bowel sounds are normal. There is no distension.     Palpations: Abdomen is soft.     Tenderness: There is no abdominal tenderness.  Musculoskeletal:        General: No swelling or tenderness.     Cervical back: Neck supple.  Lymphadenopathy:     Cervical: No cervical adenopathy.  Neurological:     Mental Status: He is alert and oriented to person, place, and time.  Psychiatric:        Mood and Affect: Mood normal.        Behavior: Behavior normal.           Assessment & Plan:  Please see problem based charting for assessment and plan:

## 2020-03-16 NOTE — Assessment & Plan Note (Signed)
-  This problem is chronic and stable -Patient follows with psychiatry as an outpatient for this -He has had his fluphenazine dose decreased to 25 mg every 2 weeks -He does appear to have more tangential thought today.  He will continue to follow-up with psychiatry as an outpatient -No further work-up at this time

## 2020-03-17 ENCOUNTER — Telehealth: Payer: Self-pay

## 2020-03-17 LAB — LIPID PANEL
Chol/HDL Ratio: 2.5 ratio (ref 0.0–5.0)
Cholesterol, Total: 167 mg/dL (ref 100–199)
HDL: 66 mg/dL (ref >39)
LDL Chol Calc (NIH): 90 mg/dL (ref 0–99)
Triglycerides: 53 mg/dL (ref 0–149)
VLDL Cholesterol Cal: 11 mg/dL (ref 5–40)

## 2020-03-17 LAB — BMP8+ANION GAP
Anion Gap: 13 mmol/L (ref 10.0–18.0)
BUN/Creatinine Ratio: 9 — ABNORMAL LOW (ref 10–24)
BUN: 11 mg/dL (ref 8–27)
CO2: 22 mmol/L (ref 20–29)
Calcium: 9.1 mg/dL (ref 8.6–10.2)
Chloride: 102 mmol/L (ref 96–106)
Creatinine, Ser: 1.26 mg/dL (ref 0.76–1.27)
GFR calc Af Amer: 69 mL/min/{1.73_m2} (ref >59)
GFR calc non Af Amer: 59 mL/min/{1.73_m2} — ABNORMAL LOW (ref >59)
Glucose: 130 mg/dL — ABNORMAL HIGH (ref 65–99)
Potassium: 4.6 mmol/L (ref 3.5–5.2)
Sodium: 137 mmol/L (ref 134–144)

## 2020-03-17 NOTE — Telephone Encounter (Signed)
Requesting lab results, please call pt back.  

## 2020-03-17 NOTE — Telephone Encounter (Signed)
Please tell Wayne Schmidt that his results came back late and I was unable to call him today. I am out of the office tomorrow but will call him on Friday. There was nothing concerning on his results.

## 2020-03-18 ENCOUNTER — Encounter (HOSPITAL_COMMUNITY): Payer: Self-pay | Admitting: *Deleted

## 2020-03-18 ENCOUNTER — Other Ambulatory Visit: Payer: Self-pay

## 2020-03-18 ENCOUNTER — Ambulatory Visit (INDEPENDENT_AMBULATORY_CARE_PROVIDER_SITE_OTHER): Payer: Medicare Other | Admitting: *Deleted

## 2020-03-18 VITALS — BP 151/98 | HR 98 | Ht 73.0 in | Wt 234.6 lb

## 2020-03-18 DIAGNOSIS — F200 Paranoid schizophrenia: Secondary | ICD-10-CM

## 2020-03-18 MED ORDER — FLUPHENAZINE DECANOATE 25 MG/ML IJ SOLN
125.0000 mg | Freq: Once | INTRAMUSCULAR | Status: AC
  Administered 2020-03-18: 125 mg via INTRAMUSCULAR

## 2020-03-18 NOTE — Progress Notes (Signed)
Patient given Prolixin injection in right upper outer quadrant. Patient tolerated injection well. Patient in good mood. Patient to return in two weeks.

## 2020-03-18 NOTE — Telephone Encounter (Signed)
Relayed info below from PCP to patient. Hubbard Hartshorn, BSN, RN-BC

## 2020-03-22 ENCOUNTER — Telehealth: Payer: Self-pay | Admitting: Internal Medicine

## 2020-03-22 NOTE — Telephone Encounter (Signed)
I called [name] to discuss the results of his blood work with him.  Patient's BMP was within normal limits except for mildly elevated blood glucose.  His creatinine was also mildly elevated from previous but was within normal limits.  We will continue to monitor closely.  Of note, patient's LDL has slightly worsened to 90 and his total cholesterol is up to 167.  Patient's HDL was 66.  Patient is currently on pravastatin 40 mg.  Patient's 10-year ASCVD risk for was 37.3%.  We talked today about possibly transitioning to a high intensity statin and the importance of smoking cessation and improving his blood glucose control.  After discussion we decided that we would defer transitioning him to high intensity statin at this time but revisit this topic at his next appointment with me.  Patient expresses understanding and is in agreement with plan.

## 2020-03-22 NOTE — Telephone Encounter (Signed)
I called Wayne Schmidt to discuss the results of his blood work with him.  Patient's BMP was within normal limits except for mildly elevated blood glucose.  His creatinine was also mildly elevated from previous but was within normal limits.  We will continue to monitor closely.  Of note, patient's LDL has slightly worsened to 90 and his total cholesterol is up to 167.  Patient's HDL was 66.  Patient is currently on pravastatin 40 mg.  Patient's 10-year ASCVD risk for was 37.3%.  We talked today about possibly transitioning to a high intensity statin and the importance of smoking cessation and improving his blood glucose control.  After discussion we decided that we would defer transitioning him to high intensity statin at this time but revisit this topic at his next appointment with me.  Patient expresses understanding and is in agreement with plan.

## 2020-04-01 ENCOUNTER — Encounter (HOSPITAL_COMMUNITY): Payer: Self-pay

## 2020-04-01 ENCOUNTER — Other Ambulatory Visit: Payer: Self-pay

## 2020-04-01 ENCOUNTER — Ambulatory Visit (INDEPENDENT_AMBULATORY_CARE_PROVIDER_SITE_OTHER): Payer: Medicare Other

## 2020-04-01 DIAGNOSIS — F200 Paranoid schizophrenia: Secondary | ICD-10-CM | POA: Diagnosis not present

## 2020-04-01 MED ORDER — FLUPHENAZINE DECANOATE 25 MG/ML IJ SOLN
25.0000 mg | Freq: Once | INTRAMUSCULAR | Status: AC
  Administered 2020-04-01: 25 mg via INTRAMUSCULAR

## 2020-04-01 NOTE — Progress Notes (Signed)
Pt presents to clinic today for due Prolixin 25mg injection. Pt mood pleasant and cooperative. Injection given in left upper outer quadrant without c/o. Pt to return in approximately two weeks for next injection.  °

## 2020-04-15 ENCOUNTER — Encounter (HOSPITAL_COMMUNITY): Payer: Self-pay

## 2020-04-15 ENCOUNTER — Other Ambulatory Visit: Payer: Self-pay

## 2020-04-15 ENCOUNTER — Ambulatory Visit (INDEPENDENT_AMBULATORY_CARE_PROVIDER_SITE_OTHER): Payer: Medicare Other

## 2020-04-15 DIAGNOSIS — F200 Paranoid schizophrenia: Secondary | ICD-10-CM | POA: Diagnosis not present

## 2020-04-15 MED ORDER — FLUPHENAZINE DECANOATE 25 MG/ML IJ SOLN
25.0000 mg | Freq: Once | INTRAMUSCULAR | Status: AC
  Administered 2020-04-15: 25 mg via INTRAMUSCULAR

## 2020-04-15 NOTE — Progress Notes (Signed)
Pt presents to clinic today for due Prolixin 25mg injection. Pt moodpleasant andcooperative. Injection given inrightupper outer quadrant without c/o.Pt to return in approximately two weeks for next injection.

## 2020-04-23 LAB — HM DIABETES EYE EXAM

## 2020-04-26 ENCOUNTER — Encounter: Payer: Self-pay | Admitting: *Deleted

## 2020-04-29 ENCOUNTER — Other Ambulatory Visit: Payer: Self-pay

## 2020-04-29 ENCOUNTER — Encounter (HOSPITAL_COMMUNITY): Payer: Self-pay | Admitting: *Deleted

## 2020-04-29 ENCOUNTER — Ambulatory Visit (INDEPENDENT_AMBULATORY_CARE_PROVIDER_SITE_OTHER): Payer: Medicare Other | Admitting: *Deleted

## 2020-04-29 DIAGNOSIS — F200 Paranoid schizophrenia: Secondary | ICD-10-CM | POA: Diagnosis not present

## 2020-04-29 MED ORDER — FLUPHENAZINE DECANOATE 25 MG/ML IJ SOLN
25.0000 mg | Freq: Once | INTRAMUSCULAR | Status: AC
  Administered 2020-04-29: 25 mg via INTRAMUSCULAR

## 2020-04-29 NOTE — Progress Notes (Signed)
Pt in clinic today for due Prolixin injection. Pt cooperative and pleasant on approach. Injection gven in right upper outer quadrant. Pt to return in two weeks for next due injection.

## 2020-05-13 ENCOUNTER — Encounter (HOSPITAL_COMMUNITY): Payer: Self-pay | Admitting: Psychiatry

## 2020-05-13 ENCOUNTER — Other Ambulatory Visit: Payer: Self-pay

## 2020-05-13 ENCOUNTER — Telehealth (INDEPENDENT_AMBULATORY_CARE_PROVIDER_SITE_OTHER): Payer: Medicare Other | Admitting: Psychiatry

## 2020-05-13 ENCOUNTER — Ambulatory Visit (INDEPENDENT_AMBULATORY_CARE_PROVIDER_SITE_OTHER): Payer: Medicare Other

## 2020-05-13 ENCOUNTER — Encounter (HOSPITAL_COMMUNITY): Payer: Self-pay

## 2020-05-13 DIAGNOSIS — G2401 Drug induced subacute dyskinesia: Secondary | ICD-10-CM

## 2020-05-13 DIAGNOSIS — F200 Paranoid schizophrenia: Secondary | ICD-10-CM | POA: Diagnosis not present

## 2020-05-13 MED ORDER — FLUPHENAZINE DECANOATE 25 MG/ML IJ SOLN
25.0000 mg | Freq: Once | INTRAMUSCULAR | Status: AC
  Administered 2020-05-13: 25 mg via INTRAMUSCULAR

## 2020-05-13 MED ORDER — FLUPHENAZINE DECANOATE 25 MG/ML IJ SOLN: 25.0000 mg | INTRAMUSCULAR | 2 refills | Status: DC

## 2020-05-13 NOTE — Progress Notes (Signed)
Virtual Visit via Telephone Note  I connected with Wayne Schmidt on 05/13/20 at 11:00 AM EST by telephone and verified that I am speaking with the correct person using two identifiers.  Location: Patient: Home Provider: Home Office   I discussed the limitations, risks, security and privacy concerns of performing an evaluation and management service by telephone and the availability of in person appointments. I also discussed with the patient that there may be a patient responsible charge related to this service. The patient expressed understanding and agreed to proceed.   History of Present Illness: Patient is evaluated by phone session.  He has been compliant with his Prolixin injection and now he is more comfortable with the current dose.  He is sleeping good.  He is a still religiously preoccupied but denies any hallucination or any suicidal thoughts.  He has sometimes paranoia but it is stable.  He has tremors but does not want to take any medication for the tremors.  His appetite is okay.  His energy level is okay.  He walks few times a week.  He is looking forward for Thanksgiving and is hoping that he will stop by to his daughter.  He denies drinking or using any illegal substances.  His weight is stable.  Recently he had blood work by his PCP.  His hemoglobin A1c 7.4.  Past Psychiatric History:Reviewed. H/Oschizophreniawith multiple inpatient treatment.H/O of hallucination, paranoia and suicidal attempt by overdose. [facility].TookHaldol and Thorazine buthad side effects.OnProlixin injection formanyyears.Tried hydroxyzineand Ingreezabut could not tolerate.  Recent Results (from the past 2160 hour(s))  Glucose, capillary     Status: Abnormal   Collection Time: 03/16/20  9:38 AM  Result Value Ref Range   Glucose-Capillary 146 (H) 70 - 99 mg/dL    Comment: Glucose reference range applies only to samples taken after fasting for at least 8 hours.  POC Hbg A1C      Status: Abnormal   Collection Time: 03/16/20  9:48 AM  Result Value Ref Range   Hemoglobin A1C 7.4 (A) 4.0 - 5.6 %   HbA1c POC (<> result, manual entry)     HbA1c, POC (prediabetic range)     HbA1c, POC (controlled diabetic range)    BMP8+Anion Gap     Status: Abnormal   Collection Time: 03/16/20 10:31 AM  Result Value Ref Range   Glucose 130 (H) 65 - 99 mg/dL   BUN 11 8 - 27 mg/dL   Creatinine, Ser 1.26 0.76 - 1.27 mg/dL   GFR calc non Af Amer 59 (L) >59 mL/min/1.73   GFR calc Af Amer 69 >59 mL/min/1.73    Comment: **[facility] currently reports eGFR in compliance with the current**   recommendations of the [employer]. [facility] will   update reporting as new guidelines are published from the NKF-ASN   Task force.    BUN/Creatinine Ratio 9 (L) 10 - 24   Sodium 137 134 - 144 mmol/L   Potassium 4.6 3.5 - 5.2 mmol/L   Chloride 102 96 - 106 mmol/L   CO2 22 20 - 29 mmol/L   Anion Gap 13.0 10.0 - 18.0 mmol/L   Calcium 9.1 8.6 - 10.2 mg/dL  Lipid Profile     Status: None   Collection Time: 03/16/20 10:31 AM  Result Value Ref Range   Cholesterol, Total 167 100 - 199 mg/dL   Triglycerides 53 0 - 149 mg/dL   HDL 66 >39 mg/dL   VLDL Cholesterol Cal 11 5 - 40 mg/dL  LDL Chol Calc (NIH) 90 0 - 99 mg/dL   Chol/HDL Ratio 2.5 0.0 - 5.0 ratio    Comment:                                   T. Chol/HDL Ratio                                             Men  Women                               1/2 Avg.Risk  3.4    3.3                                   Avg.Risk  5.0    4.4                                2X Avg.Risk  9.6    7.1                                3X Avg.Risk 23.4   11.0   HM DIABETES EYE EXAM     Status: None   Collection Time: 04/23/20 12:00 AM  Result Value Ref Range   HM Diabetic Eye Exam No Retinopathy No Retinopathy    Comment: [facility]      Psychiatric Specialty Exam: Physical Exam  Review of Systems  Weight 236 lb (107 kg).There is no  height or weight on file to calculate BMI.  General Appearance: NA  Eye Contact:  NA  Speech:  Slow  Volume:  Decreased  Mood:  Euthymic  Affect:  NA  Thought Process:  Descriptions of Associations: Circumstantial  Orientation:  Full (Time, Place, and Person)  Thought Content:  Paranoid Ideation  Suicidal Thoughts:  No  Homicidal Thoughts:  No  Memory:  Immediate;   Fair Recent;   Fair Remote;   Fair  Judgement:  Fair  Insight:  Fair  Psychomotor Activity:  NA  Concentration:  Concentration: Fair and Attention Span: Fair  Recall:  [employer] of Knowledge:  Fair  Language:  Fair  Akathisia:  tremors  Handed:  Right  AIMS (if indicated):     Assets:  Communication Skills Desire for Improvement Housing  ADL's:  Intact  Cognition:  WNL  Sleep:   ok      Assessment and Plan: Schizophrenia chronic paranoid type.  TD.  Patient is compliant with his injection and now he is more comfortable and not having any concerns.  Continue Prolixin 25 mg intramuscular every 2 weeks.  He is not interested to take any medicine for tremors.  Discussed medication side effects and benefits.  Recommended to call us back if he has any question or any concern.  I reviewed blood work.  Follow-up in 3 months.  Follow Up Instructions:    I discussed the assessment and treatment plan with the patient. The patient was provided an opportunity to ask questions and all were answered. The patient agreed with the plan and demonstrated an  understanding of the instructions.   The patient was advised to call back or seek an in-person evaluation if the symptoms worsen or if the condition fails to improve as anticipated.  I provided 17 minutes of non-face-to-face time during this encounter.   [name], MD

## 2020-05-13 NOTE — Progress Notes (Signed)
Pt presents to clinic today for due Prolixin 25mg injection. Pt moodpleasant andcooperative. Injection given inleftupper outer quadrant without c/o.Pt to return in approximately two weeks for next injection.

## 2020-05-26 ENCOUNTER — Other Ambulatory Visit: Payer: Self-pay

## 2020-05-26 ENCOUNTER — Encounter (HOSPITAL_COMMUNITY): Payer: Self-pay | Admitting: *Deleted

## 2020-05-26 ENCOUNTER — Ambulatory Visit (INDEPENDENT_AMBULATORY_CARE_PROVIDER_SITE_OTHER): Payer: Medicare Other | Admitting: *Deleted

## 2020-05-26 VITALS — BP 139/80 | HR 73 | Ht 72.0 in | Wt 232.0 lb

## 2020-05-26 DIAGNOSIS — F200 Paranoid schizophrenia: Secondary | ICD-10-CM | POA: Diagnosis not present

## 2020-05-26 MED ORDER — FLUPHENAZINE DECANOATE 25 MG/ML IJ SOLN: 125.0000 mg | Freq: Once | INTRAMUSCULAR | Status: DC

## 2020-05-26 MED ORDER — FLUPHENAZINE DECANOATE 25 MG/ML IJ SOLN
25.0000 mg | Freq: Once | INTRAMUSCULAR | Status: AC
  Administered 2020-05-26: 25 mg via INTRAMUSCULAR

## 2020-05-26 NOTE — Progress Notes (Signed)
Patient was given in of Prolixin in upper outer left quadrant. Patient tolerated injection well. Patient to return in 2 weeks.

## 2020-06-09 ENCOUNTER — Other Ambulatory Visit: Payer: Self-pay

## 2020-06-09 ENCOUNTER — Ambulatory Visit (INDEPENDENT_AMBULATORY_CARE_PROVIDER_SITE_OTHER): Payer: Medicare Other | Admitting: *Deleted

## 2020-06-09 ENCOUNTER — Encounter (HOSPITAL_COMMUNITY): Payer: Self-pay

## 2020-06-09 VITALS — BP 144/85 | HR 99 | Resp 18

## 2020-06-09 DIAGNOSIS — F200 Paranoid schizophrenia: Secondary | ICD-10-CM

## 2020-06-09 MED ORDER — FLUPHENAZINE DECANOATE 25 MG/ML IJ SOLN
25.0000 mg | Freq: Once | INTRAMUSCULAR | Status: AC
Start: 2020-06-09 — End: 2020-06-09
  Administered 2020-06-09: 25 mg via INTRAMUSCULAR

## 2020-06-09 NOTE — Progress Notes (Signed)
Pt in clinic today for due injection of Prolixin 25mg . Pt cooperative and appropriate. Injection given in right upper outer quadrant without compliant. Pt to return in two weeks for next due injection.

## 2020-06-22 ENCOUNTER — Ambulatory Visit (INDEPENDENT_AMBULATORY_CARE_PROVIDER_SITE_OTHER): Payer: Medicare Other | Admitting: Internal Medicine

## 2020-06-22 ENCOUNTER — Other Ambulatory Visit: Payer: Self-pay

## 2020-06-22 ENCOUNTER — Encounter: Payer: Self-pay | Admitting: Internal Medicine

## 2020-06-22 VITALS — BP 124/78 | HR 76 | Temp 98.3°F | Ht 72.0 in | Wt 233.0 lb

## 2020-06-22 DIAGNOSIS — Z0000 Encounter for general adult medical examination without abnormal findings: Secondary | ICD-10-CM

## 2020-06-22 DIAGNOSIS — R351 Nocturia: Secondary | ICD-10-CM

## 2020-06-22 DIAGNOSIS — F200 Paranoid schizophrenia: Secondary | ICD-10-CM | POA: Diagnosis not present

## 2020-06-22 DIAGNOSIS — I10 Essential (primary) hypertension: Secondary | ICD-10-CM

## 2020-06-22 DIAGNOSIS — E785 Hyperlipidemia, unspecified: Secondary | ICD-10-CM

## 2020-06-22 DIAGNOSIS — Z23 Encounter for immunization: Secondary | ICD-10-CM

## 2020-06-22 DIAGNOSIS — E1169 Type 2 diabetes mellitus with other specified complication: Secondary | ICD-10-CM | POA: Diagnosis not present

## 2020-06-22 DIAGNOSIS — N401 Enlarged prostate with lower urinary tract symptoms: Secondary | ICD-10-CM | POA: Diagnosis not present

## 2020-06-22 LAB — POCT GLYCOSYLATED HEMOGLOBIN (HGB A1C): Hemoglobin A1C: 7.8 % — AB (ref 4.0–5.6)

## 2020-06-22 LAB — GLUCOSE, CAPILLARY: Glucose-Capillary: 141 mg/dL — ABNORMAL HIGH (ref 70–99)

## 2020-06-22 MED ORDER — EMPAGLIFLOZIN 25 MG PO TABS: 25.0000 mg | ORAL_TABLET | Freq: Every day | ORAL | 1 refills | Status: DC

## 2020-06-22 NOTE — Assessment & Plan Note (Signed)
Lab Results  Component Value Date   HGBA1C 7.8 (A) 06/22/2020   HGBA1C 7.4 (A) 03/16/2020   HGBA1C 7.6 (A) 11/11/2019     Assessment: Diabetes control:  fair Progress toward A1C goal:   deteriorated Comments: patient is compliant with jardiance 10 mg  Plan: Medications:  will increase jardiance to 25 mg Home glucose monitoring: Frequency:   Timing:   Instruction/counseling given: discussed the need for weight loss and discussed diet Educational resources provided:   Self management tools provided:   Other plans: Will follow up with patient in 1 month to assess tolerance to this medication

## 2020-06-22 NOTE — Assessment & Plan Note (Addendum)
-   Patient has had increased frequency of urination and nocturia likely secondary to his BPH.  -He is currently on doxazosin but suspect we will need to discontinue this secondary to his episodes of lightheadedness. He was originally on flomax but stopped this in the past. - We briefly discussed restarting his flomax but he did not want to change his medication currently - Would also consider checking his PSA at his next visit after informed discussion about risks and benefits of PSA evaluation as well as doing a bladder sono to demonstrate an enlarged prostate

## 2020-06-22 NOTE — Assessment & Plan Note (Signed)
-   PNA -13 valent vaccine administered today

## 2020-06-22 NOTE — Assessment & Plan Note (Signed)
-  This problem is chronic and stable -Patient follows up with psychiatry as an outpatient and receives fluphenazine every 2 weeks -He does have tangential thoughts and it is difficult for him to focus occasionally. He will continue to follow-up with psychiatry for this -No further work-up for now

## 2020-06-22 NOTE — Progress Notes (Signed)
   Subjective:    Patient ID: Wayne Schmidt, male    DOB: 04/19/1956, 64 y.o.   MRN: [id number]  HPI  I have seen and examined this patient. He is here for routine follow up of his HTN and DM.  Patient states that he feels well and is complaint with his medication. He does note occasional episodes of lightheadedness and states he does have to urinate frequently and wakes up in the night to urinate. He denies any other complaints at this time.    Review of Systems  Constitutional: Negative.   HENT: Negative.   Respiratory: Negative.   Cardiovascular: Negative.   Gastrointestinal: Negative.   Genitourinary:       Nocturia, frequent urination  Musculoskeletal: Negative.   Neurological: Positive for light-headedness.  Psychiatric/Behavioral: Negative.        Objective:   Physical Exam Constitutional:      Appearance: Normal appearance.  HENT:     Head: Normocephalic and atraumatic.  Cardiovascular:     Rate and Rhythm: Normal rate and regular rhythm.     Heart sounds: Normal heart sounds.  Pulmonary:     Effort: Pulmonary effort is normal.     Breath sounds: Normal breath sounds. No wheezing or rales.  Abdominal:     General: Bowel sounds are normal. There is no distension.     Palpations: Abdomen is soft.     Tenderness: There is no abdominal tenderness.  Musculoskeletal:        General: No swelling or tenderness.  Neurological:     General: No focal deficit present.     Mental Status: He is alert and oriented to person, place, and time.  Psychiatric:        Mood and Affect: Mood normal.        Behavior: Behavior normal.           Assessment & Plan:  Please see problem based charting for assessment and plan:

## 2020-06-22 NOTE — Assessment & Plan Note (Signed)
-  This problem is chronic and stable -Patient on pravastatin 40 mg daily -Given his history of diabetes and hypertension patient may benefit from transitioning him to simvastatin. Would consider checking a lipid panel at his next visit in calculating ASCVD risk score. If elevated will discuss with patient about transition to simvastatin

## 2020-06-22 NOTE — Patient Instructions (Signed)
-  It was a pleasure seeing you today -I hope you have a great holiday! -I have changed the dosage of Jardiance from 10 mg to 25 mg -Please follow-up with me in 1 month so that we can see how you are tolerating this medication as well as address your frequent urination and possibly changing your cholesterol medication -Please call me if you have any questions or concerns or if you need any refills

## 2020-06-22 NOTE — Assessment & Plan Note (Signed)
BP Readings from Last 3 Encounters:  06/22/20 124/78  06/09/20 (!) 144/85  05/26/20 139/80    Lab Results  Component Value Date   NA 137 03/16/2020   K 4.6 03/16/2020   CREATININE 1.26 03/16/2020    Assessment: Blood pressure control:  well-controlled Progress toward BP goal:   at goal Comments: Patient is compliant with losartan 100 mg daily as well as cardura 2 mg daily  Plan: Medications:  continue current medications Educational resources provided:   Self management tools provided:   Other plans: We discussed that the cardura may be causing his lightheadedness and we could dc this med and try an alternative medication but he did not want to change this at this time. We will re-address this at his follow-up visit

## 2020-06-23 ENCOUNTER — Encounter (HOSPITAL_COMMUNITY): Payer: Self-pay

## 2020-06-23 ENCOUNTER — Ambulatory Visit (INDEPENDENT_AMBULATORY_CARE_PROVIDER_SITE_OTHER): Payer: Medicare Other | Admitting: *Deleted

## 2020-06-23 VITALS — BP 151/69 | HR 92 | Ht 72.5 in | Wt 231.8 lb

## 2020-06-23 DIAGNOSIS — F200 Paranoid schizophrenia: Secondary | ICD-10-CM

## 2020-06-23 MED ORDER — FLUPHENAZINE DECANOATE 25 MG/ML IJ SOLN
25.0000 mg | Freq: Once | INTRAMUSCULAR | Status: AC
Start: 2020-06-23 — End: 2020-06-23
  Administered 2020-06-23: 25 mg via INTRAMUSCULAR

## 2020-06-23 NOTE — Progress Notes (Signed)
Pt in today for due injection of Prolixin 25 mg. Pt pleasant and cooperative on approach. Injection given in left upper outer quadrant. Pt had no c/o. Will return in two weeks for next due injection.

## 2020-07-07 ENCOUNTER — Other Ambulatory Visit: Payer: Self-pay

## 2020-07-07 ENCOUNTER — Encounter (HOSPITAL_COMMUNITY): Payer: Self-pay | Admitting: *Deleted

## 2020-07-07 ENCOUNTER — Ambulatory Visit (HOSPITAL_COMMUNITY): Payer: Medicare Other | Admitting: *Deleted

## 2020-07-07 VITALS — BP 154/93 | HR 87 | Ht 73.0 in | Wt 234.0 lb

## 2020-07-07 DIAGNOSIS — F200 Paranoid schizophrenia: Secondary | ICD-10-CM

## 2020-07-07 MED ORDER — FLUPHENAZINE DECANOATE 25 MG/ML IJ SOLN
25.0000 mg | Freq: Once | INTRAMUSCULAR | Status: AC
Start: 2020-07-07 — End: 2020-07-28
  Administered 2020-07-28: 25 mg via INTRAMUSCULAR

## 2020-07-07 NOTE — Progress Notes (Signed)
Patient came in for injection of Prolixin.  Injection given in left upper outer quadrant.  Patient tolerated injection well.  Will return in 2 weeks.

## 2020-07-13 ENCOUNTER — Encounter: Payer: Self-pay | Admitting: *Deleted

## 2020-07-15 ENCOUNTER — Other Ambulatory Visit: Payer: Self-pay

## 2020-07-15 ENCOUNTER — Encounter (HOSPITAL_COMMUNITY): Payer: Self-pay | Admitting: Emergency Medicine

## 2020-07-15 ENCOUNTER — Ambulatory Visit (HOSPITAL_COMMUNITY)
Admission: EM | Admit: 2020-07-15 | Discharge: 2020-07-15 | Disposition: A | Discharge status: Home / Self Care | Payer: Medicare Other | Attending: Family Medicine | Admitting: Family Medicine

## 2020-07-15 DIAGNOSIS — U071 COVID-19: Secondary | ICD-10-CM | POA: Diagnosis not present

## 2020-07-15 DIAGNOSIS — R531 Weakness: Secondary | ICD-10-CM

## 2020-07-15 DIAGNOSIS — R0981 Nasal congestion: Secondary | ICD-10-CM | POA: Diagnosis not present

## 2020-07-15 DIAGNOSIS — R519 Headache, unspecified: Secondary | ICD-10-CM

## 2020-07-15 LAB — POCT URINALYSIS DIPSTICK, ED / UC
Bilirubin Urine: NEGATIVE
Glucose, UA: 500 mg/dL — AB
Ketones, ur: 15 mg/dL — AB
Leukocytes,Ua: NEGATIVE
Nitrite: NEGATIVE
Protein, ur: 30 mg/dL — AB
Specific Gravity, Urine: 1.025 (ref 1.005–1.030)
Urobilinogen, UA: 0.2 mg/dL (ref 0.0–1.0)
pH: 5.5 (ref 5.0–8.0)

## 2020-07-15 LAB — CBG MONITORING, ED: Glucose-Capillary: 92 mg/dL (ref 70–99)

## 2020-07-15 NOTE — ED Triage Notes (Signed)
Pt presents with dizziness, weakness, headache and diarrhea xs 1-2 weeks. Pt states Jardiance was increased to 25 mg in Dec and states he feels these symptoms are related to the increase in medication.

## 2020-07-15 NOTE — ED Provider Notes (Signed)
[place]    CSN: [id number] Arrival date & time: 07/15/20  1703      History   Chief Complaint Chief Complaint  Patient presents with  . Dizziness  . Diarrhea  . Weakness    HPI Piero Mustard is a 65 y.o. male.   Here today with 1 week of intermittent episodes of dizziness, weakness, diarrhea, cough. Denies known fever, chills, CP, SOB, palpitations, syncope, missed medication, N/V. Has not tried anything OTC for sxs. Hx of DM, HTN, paranoid schizophrenia compliant with regimen for all. Only recent change was increasing his jardiance which he thinks may have something to do with it. Home BSs typically running around 130s per patient and not checking home BPs regularly. No known sick contacts recently.      Past Medical History:  Diagnosis Date  . Blood creatinine increased compared with prior measurement 07/29/2019  . Diabetes mellitus without complication ([place])   . High risk sexual behavior 10/2007  . Hypertension   . Microcytosis   . Schizophrenia ([place])   . Substance abuse ([place])    tobacco use  . UTI (urinary tract infection) 08/24/2016   Admitted 08/23/16-08/26/16 with several day history of UTI symptoms + positive UA. Culture resulted pan-sensitive E.coli. Was treated with IV Ceftriaxone and d/c home with Bactrim to complete 10 day course.     Patient Active Problem List   Diagnosis Date Noted  . Right shoulder pain 11/11/2019  . Plantar fasciitis 06/04/2019  . Healthcare maintenance 04/01/2014  . Lumbar radiculopathy 08/04/2013  . 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 MICROCYTOSIS 02/24/2010  . BPH associated with nocturia 03/24/2009  . Paranoid schizophrenia, subchronic condition ([place]) 10/11/2006  . Hyperlipidemia 08/28/2006  . Type 2 diabetes mellitus with other specified complication ([place]) 89/38/1017  . TOBACCO ABUSE 04/27/2006  . Essential hypertension 04/27/2006    Past Surgical History:  Procedure Laterality Date  . LAMINECTOMY     by  Dr Rolin Barry       Home Medications    Prior to Admission medications   Medication Sig Start Date End Date Taking? Authorizing Provider  doxazosin (CARDURA) 2 MG tablet TAKE (1) TABLET BY MOUTH ONCE DAILY. 10/15/19   Aldine Contes, MD  empagliflozin (JARDIANCE) 25 MG TABS tablet Take 1 tablet (25 mg total) by mouth daily. 06/22/20   Aldine Contes, MD  fluPHENAZine decanoate (PROLIXIN) 25 MG/ML injection Inject 1 mL (25 mg total) into the muscle every 14 (fourteen) days. 05/13/20   Arfeen, Arlyce Harman, MD  hydroxypropyl methylcellulose / hypromellose (ISOPTO TEARS / GONIOVISC) 2.5 % ophthalmic solution Place 2 drops into both eyes daily as needed for dry eyes.    [provider]  losartan (COZAAR) 100 MG tablet TAKE (1) TABLET BY MOUTH ONCE DAILY. 03/15/20   Aldine Contes, MD  pravastatin (PRAVACHOL) 40 MG tablet TAKE (1) TABLET BY MOUTH ONCE DAILY. 03/15/20   Aldine Contes, MD  VOLTAREN 1 % GEL Apply 2 g topically 4 (four) times daily. 06/04/19   Earlene Plater, MD    Family History Family History  Problem Relation Age of Onset  . Diabetes Mother   . Hypertension Mother   . Lupus Sister   . Sickle cell anemia Brother   . Drug abuse Brother     Social History Social History   Tobacco Use  . Smoking status: Current Every Day Smoker    Packs/day: 0.75    Years: 38.00    Pack years: 28.50    Types: Cigarettes  .  Smokeless tobacco: Never Used  . Tobacco comment: Has cut back to 1/2 pack a day but not ready to quit  Vaping Use  . Vaping Use: Never used  Substance Use Topics  . Alcohol use: Yes    Alcohol/week: 0.0 standard drinks    Comment: Occasional a 16 oz beer once a month   . Drug use: No    Comment: h/o remote cocaine use-denies present use     Allergies   Ace inhibitors, Atorvastatin, Chlorpromazine hcl, and Simvastatin   Review of Systems Review of Systems PER HPI    Physical Exam Triage Vital Signs ED Triage Vitals  Enc Vitals Group      BP 07/15/20 1809 (!) 148/78     Pulse Rate 07/15/20 1809 88     Resp 07/15/20 1809 17     Temp 07/15/20 1809 98.6 F (37 C)     Temp Source 07/15/20 1809 Oral     SpO2 07/15/20 1809 95 %     Weight --      Height --      Head Circumference --      Peak Flow --      Pain Score 07/15/20 1806 3     Pain Loc --      Pain Edu? --      Excl. in [place]? --    No data found.  Updated Vital Signs BP (!) 148/78 (BP Location: Right Arm)   Pulse 88   Temp 98.6 F (37 C) (Oral)   Resp 17   SpO2 95%   Visual Acuity Right Eye Distance:   Left Eye Distance:   Bilateral Distance:    Right Eye Near:   Left Eye Near:    Bilateral Near:     Physical Exam Vitals and nursing note reviewed.  Constitutional:      Appearance: Normal appearance.  HENT:     Head: Atraumatic.     Right Ear: Tympanic membrane normal.     Left Ear: Tympanic membrane normal.     Nose: Nose normal.     Mouth/Throat:     Mouth: Mucous membranes are moist.     Pharynx: Posterior oropharyngeal erythema present. No oropharyngeal exudate.  Eyes:     Extraocular Movements: Extraocular movements intact.     Conjunctiva/sclera: Conjunctivae normal.  Cardiovascular:     Rate and Rhythm: Normal rate and regular rhythm.     Heart sounds: Normal heart sounds.  Pulmonary:     Effort: Pulmonary effort is normal. No respiratory distress.     Breath sounds: Normal breath sounds. No wheezing or rales.  Abdominal:     General: Bowel sounds are normal. There is no distension.     Palpations: Abdomen is soft.     Tenderness: There is no abdominal tenderness. There is no guarding.  Musculoskeletal:        General: Normal range of motion.     Cervical back: Normal range of motion and neck supple.  Skin:    General: Skin is warm and dry.  Neurological:     General: No focal deficit present.     Mental Status: He is oriented to person, place, and time. Mental status is at baseline.     Motor: No weakness.     Gait:  Gait normal.  Psychiatric:        Mood and Affect: Mood normal.        Thought Content: Thought content normal.  Judgment: Judgment normal.      UC Treatments / Results  Labs (all labs ordered are listed, but only abnormal results are displayed) Labs Reviewed  SARS CORONAVIRUS 2 (TAT 6-24 HRS) - Abnormal; Notable for the following components:      Result Value   SARS Coronavirus 2 POSITIVE (*)    All other components within normal limits  POCT URINALYSIS DIPSTICK, ED / UC - Abnormal; Notable for the following components:   Glucose, UA 500 (*)    Ketones, ur 15 (*)    Hgb urine dipstick TRACE (*)    Protein, ur 30 (*)    All other components within normal limits  CBG MONITORING, ED    EKG   Radiology No results found.  Procedures Procedures (including critical care time)  Medications Ordered in UC Medications - No data to display  Initial Impression / Assessment and Plan / UC Course  I have reviewed the triage vital signs and the nursing notes.  Pertinent labs & imaging results that were available during my care of the patient were reviewed by me and considered in my medical decision making (see chart for details).     Exam reassuring today without neurologic deficits or other abnormality, U/A and CBG reassuring today as well. COVID pcr pending for r/o, suspect this may be causing his sxs. Close PCP f/u next week in case side effect to medication. Strict ED precautions given for worsening sxs.   Final Clinical Impressions(s) / UC Diagnoses   Final diagnoses:  Acute nonintractable headache, unspecified headache type  Weakness  Nasal congestion   Discharge Instructions   None    ED Prescriptions    None     PDMP not reviewed this encounter.   Volney American, [place] 07/17/20 1401

## 2020-07-16 LAB — SARS CORONAVIRUS 2 (TAT 6-24 HRS): SARS Coronavirus 2: POSITIVE — AB

## 2020-07-20 ENCOUNTER — Telehealth (HOSPITAL_COMMUNITY): Payer: Self-pay | Admitting: *Deleted

## 2020-07-20 NOTE — Telephone Encounter (Signed)
He should wait until quarantine and symptoms free.

## 2020-07-20 NOTE — Telephone Encounter (Signed)
Pt called to inform this nurse that he is Covid positive and is scheduled for his biweekly injection of Prolixin tomorrow 07/21/20. Pt says that he thinks he has 5 more days to quarantine and is asking what to do in regards to said injection.  Please review and advise.

## 2020-07-20 NOTE — Telephone Encounter (Signed)
Yes. He will be ok being a week out for injection? He is quite worried about it.

## 2020-07-20 NOTE — Telephone Encounter (Signed)
Writer spoke with pt regarding missing Prolixin injection tomorrow due to being Covid positive. Writer reassured pt that he should be fine, no worries, just advised that he starts to feel increase in sx that he could go to [facility]. Pt verbalizes understanding.

## 2020-07-20 NOTE — Telephone Encounter (Signed)
He needs reassurance.

## 2020-07-21 ENCOUNTER — Ambulatory Visit (HOSPITAL_COMMUNITY): Payer: Medicare Other

## 2020-07-27 ENCOUNTER — Encounter: Payer: Medicare Other | Admitting: Internal Medicine

## 2020-07-28 ENCOUNTER — Other Ambulatory Visit: Payer: Self-pay

## 2020-07-28 ENCOUNTER — Ambulatory Visit (INDEPENDENT_AMBULATORY_CARE_PROVIDER_SITE_OTHER): Payer: Medicare Other | Admitting: *Deleted

## 2020-07-28 VITALS — BP 138/89 | HR 106 | Resp 20 | Ht 73.0 in | Wt 223.6 lb

## 2020-07-28 DIAGNOSIS — F200 Paranoid schizophrenia: Secondary | ICD-10-CM

## 2020-07-28 NOTE — Progress Notes (Signed)
Pt in clinic today for due Prolixin 25mg  injection. Pt pleasant and cooperative on approach. Pt rescheduled appointment last week due to being Covid positive but VSS and no coughing or respiratory distress noted. Injection given in right upper outer quadrant. Pt to return in two weeks for next due injection.

## 2020-08-11 ENCOUNTER — Other Ambulatory Visit: Payer: Self-pay

## 2020-08-11 ENCOUNTER — Telehealth (INDEPENDENT_AMBULATORY_CARE_PROVIDER_SITE_OTHER): Payer: Medicare Other | Admitting: Psychiatry

## 2020-08-11 ENCOUNTER — Encounter (HOSPITAL_COMMUNITY): Payer: Self-pay | Admitting: *Deleted

## 2020-08-11 ENCOUNTER — Encounter (HOSPITAL_COMMUNITY): Payer: Self-pay | Admitting: Psychiatry

## 2020-08-11 ENCOUNTER — Ambulatory Visit (INDEPENDENT_AMBULATORY_CARE_PROVIDER_SITE_OTHER): Payer: Medicare Other | Admitting: *Deleted

## 2020-08-11 VITALS — BP 149/84 | Ht 74.0 in | Wt 244.0 lb

## 2020-08-11 DIAGNOSIS — F200 Paranoid schizophrenia: Secondary | ICD-10-CM

## 2020-08-11 DIAGNOSIS — G2401 Drug induced subacute dyskinesia: Secondary | ICD-10-CM

## 2020-08-11 MED ORDER — FLUPHENAZINE DECANOATE 25 MG/ML IJ SOLN
25.0000 mg | Freq: Once | INTRAMUSCULAR | Status: AC
  Administered 2020-08-11: 25 mg via INTRAMUSCULAR

## 2020-08-11 MED ORDER — FLUPHENAZINE DECANOATE 25 MG/ML IJ SOLN: 25.0000 mg | INTRAMUSCULAR | 2 refills | Status: DC

## 2020-08-11 NOTE — Progress Notes (Signed)
Pt in clinic today for due bi-weekly Prolixin 25mg  injection. Pleasant and cooperative with no c/o. Injection prepared and given in left upper outer quadrant. Pt to return in two weeks for next due injection.

## 2020-08-11 NOTE — Progress Notes (Signed)
Virtual Visit via Telephone Note  I connected with Wayne Schmidt on 08/11/20 at 11:00 AM EST by telephone and verified that I am speaking with the correct person using two identifiers.  Location: Patient: Home Provider: Home Office   I discussed the limitations, risks, security and privacy concerns of performing an evaluation and management service by telephone and the availability of in person appointments. I also discussed with the patient that there may be a patient responsible charge related to this service. The patient expressed understanding and agreed to proceed.   History of Present Illness: Patient is evaluated by phone session.  He was sick with COVID symptoms and reported having diarrhea and lost more than 10 pounds but now he is recovering and feeling better.  His appetite is back.  He is getting his injection to help his paranoia and hallucination and he feels it is working well.  He is sleeping good.  Sometimes he feels tired but denies any suicidal thoughts, homicidal thoughts.  He is religiously preoccupied and he has chronic paranoia but it is not worsening.  He is compliant with injection.  Around holidays he did visit to his wife in [place] and he believes he may have contracted a virus from there.  Recently he had blood work.  His hemoglobin A1c is slightly increased.  His physician has adjusted his diabetes medication.  Patient has mild tremors but he does not want to take any medication for that.   Past Psychiatric History:Reviewed. H/Oschizophreniawith multiple inpatient treatment.H/O of hallucination, paranoia and suicidal attempt by overdose. [facility]TookHaldol and Thorazine buthad side effects.OnProlixin injection formanyyears.Tried hydroxyzineand Ingreezabut could not tolerate.   Recent Results (from the past 2160 hour(s))  Glucose, capillary     Status: Abnormal   Collection Time: 06/22/20 10:50 AM  Result Value Ref Range    Glucose-Capillary 141 (H) 70 - 99 mg/dL    Comment: Glucose reference range applies only to samples taken after fasting for at least 8 hours.  POC Hbg A1C     Status: Abnormal   Collection Time: 06/22/20 11:17 AM  Result Value Ref Range   Hemoglobin A1C 7.8 (A) 4.0 - 5.6 %   HbA1c POC (<> result, manual entry)     HbA1c, POC (prediabetic range)     HbA1c, POC (controlled diabetic range)    POC Urinalysis dipstick     Status: Abnormal   Collection Time: 07/15/20  6:46 PM  Result Value Ref Range   Glucose, UA 500 (A) NEGATIVE mg/dL   Bilirubin Urine NEGATIVE NEGATIVE   Ketones, ur 15 (A) NEGATIVE mg/dL   Specific Gravity, Urine 1.025 1.005 - 1.030   Hgb urine dipstick TRACE (A) NEGATIVE   pH 5.5 5.0 - 8.0   Protein, ur 30 (A) NEGATIVE mg/dL   Urobilinogen, UA 0.2 0.0 - 1.0 mg/dL   Nitrite NEGATIVE NEGATIVE   Leukocytes,Ua NEGATIVE NEGATIVE    Comment: Biochemical Testing Only. Please order routine urinalysis from main lab if confirmatory testing is needed.  SARS CORONAVIRUS 2 (TAT 6-24 HRS) Nasopharyngeal Nasopharyngeal Swab     Status: Abnormal   Collection Time: 07/15/20  6:46 PM   Specimen: Nasopharyngeal Swab  Result Value Ref Range   SARS Coronavirus 2 POSITIVE (A) NEGATIVE    Comment: (NOTE) SARS-CoV-2 target nucleic acids are DETECTED.  The SARS-CoV-2 RNA is generally detectable in upper and lower respiratory specimens during the acute phase of infection. Positive results are indicative of the presence of SARS-CoV-2 RNA. Clinical correlation with  patient history and other diagnostic information is  necessary to determine patient infection status. Positive results do not rule out bacterial infection or co-infection with other viruses.  The expected result is Negative.  Fact Sheet for Patients: SugarRoll.be  Fact Sheet for Healthcare Providers: https://www.woods-mathews.com/  This test is not yet approved or cleared by the [place] FDA and  has been authorized for detection and/or diagnosis of SARS-CoV-2 by FDA under an Emergency Use Authorization (EUA). This EUA will remain  in effect (meaning this test can be used) for the duration of the COVID-19 declaration under Section 564(b)(1) of the Act, 21 U. S.C. section 360bbb-3(b)(1), unless the authorization is terminated or revoked sooner.   Performed at [facility], [place] [address] [place], [place] [address]   POC CBG monitoring     Status: None   Collection Time: 07/15/20  6:53 PM  Result Value Ref Range   Glucose-Capillary 92 70 - 99 mg/dL    Comment: Glucose reference range applies only to samples taken after fasting for at least 8 hours.    Psychiatric Specialty Exam: Physical Exam  Review of Systems  Weight 223 lb (101.2 kg).There is no height or weight on file to calculate BMI.  General Appearance: NA  Eye Contact:  NA  Speech:  Slow  Volume:  Decreased  Mood:  Euthymic  Affect:  NA  Thought Process:  Descriptions of Associations: Circumstantial  Orientation:  Full (Time, Place, and Person)  Thought Content:  Paranoid Ideation and religiously preocupied  Suicidal Thoughts:  No  Homicidal Thoughts:  No  Memory:  Immediate;   Fair Recent;   Fair Remote;   Fair  Judgement:  Fair  Insight:  Fair  Psychomotor Activity:  NA  Concentration:  Concentration: Fair and Attention Span: Fair  Recall:  [employer] of Knowledge:  Fair  Language:  Fair  Akathisia:  Yes  Handed:  Right  AIMS (if indicated):     Assets:  Communication Skills Desire for Improvement Housing Resilience  ADL's:  Intact  Cognition:  WNL  Sleep:   ok      Assessment and Plan: Schizophrenia chronic paranoid type.  DD.  I reviewed blood work results.  He lost weight but now he is back in his appetite.  He had COVID symptoms but he is now recovered.  He has tremors but does not want to take the medication.  He like to continue his Prolixin injection which is  helping his paranoia.  Continue Prolixin 25 mg intramuscular every 2 weeks.  Recommended to call us back with any question or any concern.  Follow-up in 3 months.  Follow Up Instructions:    I discussed the assessment and treatment plan with the patient. The patient was provided an opportunity to ask questions and all were answered. The patient agreed with the plan and demonstrated an understanding of the instructions.   The patient was advised to call back or seek an in-person evaluation if the symptoms worsen or if the condition fails to improve as anticipated.  I provided 12 minutes of non-face-to-face time during this encounter.   [name], MD

## 2020-08-25 ENCOUNTER — Ambulatory Visit (INDEPENDENT_AMBULATORY_CARE_PROVIDER_SITE_OTHER): Payer: Medicare Other | Admitting: *Deleted

## 2020-08-25 ENCOUNTER — Other Ambulatory Visit: Payer: Self-pay

## 2020-08-25 VITALS — BP 145/88 | HR 89 | Ht 72.5 in | Wt 223.0 lb

## 2020-08-25 DIAGNOSIS — F200 Paranoid schizophrenia: Secondary | ICD-10-CM

## 2020-08-25 MED ORDER — FLUPHENAZINE DECANOATE 25 MG/ML IJ SOLN
25.0000 mg | Freq: Once | INTRAMUSCULAR | Status: AC
  Administered 2020-08-25: 25 mg via INTRAMUSCULAR

## 2020-08-25 NOTE — Progress Notes (Signed)
Patient given injection in right upper outer quadrant.  Patient tolerated injection well.  Patient reports stability on medication.  No complaints voiced.

## 2020-09-08 ENCOUNTER — Other Ambulatory Visit: Payer: Self-pay

## 2020-09-08 ENCOUNTER — Ambulatory Visit (INDEPENDENT_AMBULATORY_CARE_PROVIDER_SITE_OTHER): Payer: Medicare Other | Admitting: *Deleted

## 2020-09-08 VITALS — BP 186/69 | HR 89 | Resp 20 | Ht 73.0 in | Wt 226.0 lb

## 2020-09-08 DIAGNOSIS — F200 Paranoid schizophrenia: Secondary | ICD-10-CM | POA: Diagnosis not present

## 2020-09-08 MED ORDER — FLUPHENAZINE DECANOATE 25 MG/ML IJ SOLN
25.0000 mg | Freq: Once | INTRAMUSCULAR | Status: AC
  Administered 2020-09-08: 25 mg via INTRAMUSCULAR

## 2020-09-08 NOTE — Progress Notes (Signed)
Pt in clinic today for due Prolixin 25 mg injection. Pt pleasant and cooperative on approach. Injection prepared and given in left upper outer quadrant with no c/o.

## 2020-09-22 ENCOUNTER — Other Ambulatory Visit: Payer: Self-pay

## 2020-09-22 ENCOUNTER — Ambulatory Visit (INDEPENDENT_AMBULATORY_CARE_PROVIDER_SITE_OTHER): Payer: Medicare Other | Admitting: *Deleted

## 2020-09-22 VITALS — BP 165/95 | HR 96 | Resp 20 | Ht 73.0 in | Wt 224.2 lb

## 2020-09-22 DIAGNOSIS — F200 Paranoid schizophrenia: Secondary | ICD-10-CM | POA: Diagnosis not present

## 2020-09-22 MED ORDER — FLUPHENAZINE DECANOATE 25 MG/ML IJ SOLN
25.0000 mg | Freq: Once | INTRAMUSCULAR | Status: AC
  Administered 2020-09-22: 25 mg via INTRAMUSCULAR

## 2020-09-22 NOTE — Progress Notes (Signed)
Pt in clinic today for due injection of Prolixin 25mg . Pt pleasant and cooperative on approach. Verbalizes no c/o. Injection prepared and then given in right upper outer quadrant. Pt to return in 2 weeks for next due injection.

## 2020-10-05 ENCOUNTER — Ambulatory Visit (INDEPENDENT_AMBULATORY_CARE_PROVIDER_SITE_OTHER): Payer: Medicare Other | Admitting: Internal Medicine

## 2020-10-05 VITALS — BP 163/92 | HR 95 | Temp 98.2°F | Ht 72.0 in | Wt 225.2 lb

## 2020-10-05 DIAGNOSIS — E1169 Type 2 diabetes mellitus with other specified complication: Secondary | ICD-10-CM

## 2020-10-05 DIAGNOSIS — R351 Nocturia: Secondary | ICD-10-CM

## 2020-10-05 DIAGNOSIS — N401 Enlarged prostate with lower urinary tract symptoms: Secondary | ICD-10-CM

## 2020-10-05 DIAGNOSIS — Z0000 Encounter for general adult medical examination without abnormal findings: Secondary | ICD-10-CM

## 2020-10-05 DIAGNOSIS — F200 Paranoid schizophrenia: Secondary | ICD-10-CM

## 2020-10-05 DIAGNOSIS — I10 Essential (primary) hypertension: Secondary | ICD-10-CM

## 2020-10-05 DIAGNOSIS — E785 Hyperlipidemia, unspecified: Secondary | ICD-10-CM

## 2020-10-05 LAB — POCT GLYCOSYLATED HEMOGLOBIN (HGB A1C): Hemoglobin A1C: 6.9 % — AB (ref 4.0–5.6)

## 2020-10-05 LAB — GLUCOSE, CAPILLARY: Glucose-Capillary: 151 mg/dL — ABNORMAL HIGH (ref 70–99)

## 2020-10-05 NOTE — Assessment & Plan Note (Signed)
BP Readings from Last 3 Encounters:  10/05/20 (!) 150/79  07/15/20 (!) 148/78  06/22/20 124/78    Lab Results  Component Value Date   NA 137 03/16/2020   K 4.6 03/16/2020   CREATININE 1.26 03/16/2020    Assessment: Blood pressure control:  Fair Progress toward BP goal:   Unchanged Comments: Repeat blood pressure with systolics in the 683F.  Patient is compliant with losartan 100 mg daily and Cardura 2 mg daily.  Plan: Medications:  continue current medications Educational resources provided:   Self management tools provided:   Other plans: We will talk to the patient again at his next visit about increasing blood pressure medication especially if remains elevated but he would like to think about this at this time

## 2020-10-05 NOTE — Assessment & Plan Note (Signed)
-  Patient continues to have increased frequency of urination and nocturia likely secondary to BPH -Patient is only on doxazosin for this.  We have talked about restarting him on Flomax but he refuses at this time.  States that Flomax made him urinate more. -We did talk about the risk for possible prostate cancer given that he is African-American and 65 years of age and the risks and benefits of PSA screening.  Patient would like to think about this.  We will discuss this at his next appointment -No further work-up at this time

## 2020-10-05 NOTE — Assessment & Plan Note (Signed)
Lab Results  Component Value Date   HGBA1C 6.9 (A) 10/05/2020   HGBA1C 7.8 (A) 06/22/2020   HGBA1C 7.4 (A) 03/16/2020     Assessment: Diabetes control:  Well-controlled Progress toward A1C goal:   At goal Comments: Patient is compliant with Jardiance 25 mg daily  Plan: Medications:  continue current medications Home glucose monitoring: Frequency:   Timing:   Instruction/counseling given: discussed diet Educational resources provided:   Self management tools provided:   Other plans: We will check BMP today

## 2020-10-05 NOTE — Assessment & Plan Note (Addendum)
-  This problem is chronic and stable -We will continue pravastatin 40 mg daily -We will recheck lipid panel today and see if patient would benefit from being on a high intensity statin after calculating his ASCVD risk score.  Would consider transitioning him to rosuvastatin 20 mg / 40 mg if he meets criteria for being on high intensity statin  Addendum: -Patient's lipid panel was reviewed.  His 10-year ASCVD risk was 43.1%.  Patient is currently on pravastatin 40 mg.  I believe he would benefit from being switched to a high intensity statin.  I discussed this with patient and he agrees to switch to rosuvastatin 20 mg daily.  No further work-up

## 2020-10-05 NOTE — Assessment & Plan Note (Signed)
-  This problem is chronic and stable -Patient follows up with psychiatry as an outpatient for this and receives fluphenazine every 2 weeks -During our conversation today he continues to have some tangential thoughts but overall appears to be at his baseline -No further work-up for now.  We will have patient follow-up with psychiatry

## 2020-10-05 NOTE — Assessment & Plan Note (Signed)
-  Patient states that he has gotten both his Covid vaccines and is due for a booster which he will get next week -All his other health screenings are up-to-date

## 2020-10-05 NOTE — Progress Notes (Signed)
   Subjective:    Patient ID: Wayne Schmidt, male    DOB: January 14, 1956, 65 y.o.   MRN: [id number]  HPI  I have seen and examined this patient.  Patient is here for routine follow-up of his hypertension and diabetes.  Patient states that he feels well today.  He still has episodes of waking up in the middle of the night to urinate.  States that this is slowly getting better overall and that his lightheadedness is improving as well.  He denies any other complaints at this time.  He is compliant with all his medications.  Review of Systems  Constitutional: Negative.   HENT: Negative.   Respiratory: Negative.   Cardiovascular: Negative.   Gastrointestinal: Negative.   Genitourinary:       Patient needs to use restroom 4-5 times overnight  Musculoskeletal: Negative.   Neurological: Negative.   Psychiatric/Behavioral: Negative.        Objective:   Physical Exam Constitutional:      Appearance: Normal appearance.  HENT:     Head: Normocephalic and atraumatic.  Cardiovascular:     Rate and Rhythm: Normal rate and regular rhythm.     Heart sounds: Normal heart sounds.  Pulmonary:     Breath sounds: Normal breath sounds. No wheezing or rales.  Abdominal:     General: Bowel sounds are normal. There is no distension.     Palpations: Abdomen is soft.     Tenderness: There is no abdominal tenderness.  Musculoskeletal:        General: No swelling or tenderness.     Cervical back: Neck supple.  Lymphadenopathy:     Cervical: No cervical adenopathy.  Neurological:     General: No focal deficit present.     Mental Status: He is alert and oriented to person, place, and time.  Psychiatric:        Mood and Affect: Mood normal.        Behavior: Behavior normal.           Assessment & Plan:  Please see problem based charting for assessment and plan:

## 2020-10-05 NOTE — Patient Instructions (Signed)
-It was a pleasure seeing you today -We will do a foot exam today -We will check some blood work including your cholesterol levels and kidney function -Please get your Covid booster -Please follow-up with me in 3 months -Your A1c is at goal.  Keep up the great work! -We will also recheck your blood pressure today.  It was a little high when he first came in  Prostate Cancer Screening  Prostate cancer screening is a test that is done to check for the presence of prostate cancer in men. The prostate gland is a walnut-sized gland that is located below the bladder and in front of the rectum in males. The function of the prostate is to add fluid to semen during ejaculation. Prostate cancer is the second most common type of cancer in men. Who should have prostate cancer screening?  Screening recommendations vary based on age and other risk factors. Screening is recommended if:  You are older than age 75. If you are age 89-69, talk with your health care provider about your need for screening and how often screening should be done. Because most prostate cancers are slow growing and will not cause death, screening is generally reserved in this age group for men who have a 10-15-year life expectancy.  You are younger than age 22, and you have these risk factors: ? Being a black male or a male of African descent. ? Having a father, brother, or uncle who has been diagnosed with prostate cancer. The risk is higher if your family member's cancer occurred at an early age. Screening is not recommended if:  You are younger than age 31.  You are between the ages of 56 and 6 and you have no risk factors.  You are 92 years of age or older. At this age, the risks that screening can cause are greater than the benefits that it may provide. If you are at high risk for prostate cancer, your health care provider may recommend that you have screenings more often or that you start screening at a younger age. How is  screening for prostate cancer done? The recommended prostate cancer screening test is a blood test called the prostate-specific antigen (PSA) test. PSA is a protein that is made in the prostate. As you age, your prostate naturally produces more PSA. Abnormally high PSA levels may be caused by:  Prostate cancer.  An enlarged prostate that is not caused by cancer (benign prostatic hyperplasia, BPH). This condition is very common in older men.  A prostate gland infection (prostatitis). Depending on the PSA results, you may need more tests, such as:  A physical exam to check the size of your prostate gland.  Blood and imaging tests.  A procedure to remove tissue samples from your prostate gland for testing (biopsy). What are the benefits of prostate cancer screening?  Screening can help to identify cancer at an early stage, before symptoms start and when the cancer can be treated more easily.  There is a small chance that screening may lower your risk of dying from prostate cancer. The chance is small because prostate cancer is a slow-growing cancer, and most men with prostate cancer die from a different cause. What are the risks of prostate cancer screening? The main risk of prostate cancer screening is diagnosing and treating prostate cancer that would never have caused any symptoms or problems. This is called overdiagnosisand overtreatment. PSA screening cannot tell you if your PSA is high due to cancer or a  different cause. A prostate biopsy is the only procedure to diagnose prostate cancer. Even the results of a biopsy may not tell you if your cancer needs to be treated. Slow-growing prostate cancer may not need any treatment other than monitoring, so diagnosing and treating it may cause unnecessary stress or other side effects. A prostate biopsy may also cause:  Infection or fever.  A false negative. This is a result that shows that you do not have prostate cancer when you actually do  have prostate cancer. Questions to ask your health care provider  When should I start prostate cancer screening?  What is my risk for prostate cancer?  How often do I need screening?  What type of screening tests do I need?  How do I get my test results?  What do my results mean?  Do I need treatment? Where to find more information  The American Cancer Society: www.cancer.org  American Urological Association: www.auanet.org Contact a health care provider if:  You have difficulty urinating.  You have pain when you urinate or ejaculate.  You have blood in your urine or semen.  You have pain in your back or in the area of your prostate. Summary  Prostate cancer is a common type of cancer in men. The prostate gland is located below the bladder and in front of the rectum. This gland adds fluid to semen during ejaculation.  Prostate cancer screening may identify cancer at an early stage, when the cancer can be treated more easily.  The prostate-specific antigen (PSA) test is the recommended screening test for prostate cancer.  Discuss the risks and benefits of prostate cancer screening with your health care provider. If you are age 1 or older, the risks that screening can cause are greater than the benefits that it may provide. This information is not intended to replace advice given to you by your health care provider. Make sure you discuss any questions you have with your health care provider. Document Revised: 10/10/2019 Document Reviewed: 01/30/2019 Elsevier Patient Education  [place].

## 2020-10-06 ENCOUNTER — Other Ambulatory Visit: Payer: Self-pay

## 2020-10-06 ENCOUNTER — Ambulatory Visit (HOSPITAL_COMMUNITY): Payer: Medicare Other | Admitting: *Deleted

## 2020-10-06 ENCOUNTER — Ambulatory Visit (HOSPITAL_COMMUNITY): Payer: Medicare Other

## 2020-10-06 VITALS — BP 147/87 | HR 109 | Ht 72.5 in | Wt 222.6 lb

## 2020-10-06 DIAGNOSIS — G2401 Drug induced subacute dyskinesia: Secondary | ICD-10-CM

## 2020-10-06 DIAGNOSIS — F200 Paranoid schizophrenia: Secondary | ICD-10-CM

## 2020-10-06 LAB — LIPID PANEL
Chol/HDL Ratio: 2.4 ratio (ref 0.0–5.0)
Cholesterol, Total: 196 mg/dL (ref 100–199)
HDL: 82 mg/dL (ref >39)
LDL Chol Calc (NIH): 104 mg/dL — ABNORMAL HIGH (ref 0–99)
Triglycerides: 54 mg/dL (ref 0–149)
VLDL Cholesterol Cal: 10 mg/dL (ref 5–40)

## 2020-10-06 LAB — BMP8+ANION GAP
Anion Gap: 18 mmol/L (ref 10.0–18.0)
BUN/Creatinine Ratio: 11 (ref 10–24)
BUN: 12 mg/dL (ref 8–27)
CO2: 21 mmol/L (ref 20–29)
Calcium: 9.8 mg/dL (ref 8.6–10.2)
Chloride: 100 mmol/L (ref 96–106)
Creatinine, Ser: 1.14 mg/dL (ref 0.76–1.27)
Glucose: 127 mg/dL — ABNORMAL HIGH (ref 65–99)
Potassium: 4.6 mmol/L (ref 3.5–5.2)
Sodium: 139 mmol/L (ref 134–144)
eGFR: 71 mL/min/{1.73_m2} (ref >59)

## 2020-10-06 MED ORDER — FLUPHENAZINE DECANOATE 25 MG/ML IJ SOLN
25.0000 mg | Freq: Once | INTRAMUSCULAR | Status: AC
  Administered 2020-10-20: 25 mg via INTRAMUSCULAR

## 2020-10-06 MED ORDER — FLUPHENAZINE DECANOATE 25 MG/ML IJ SOLN: 25.0000 mg | INTRAMUSCULAR | 2 refills | Status: DC

## 2020-10-06 NOTE — Patient Instructions (Signed)
Patient given injection in right upper outer quadrant.  Patient tolerated injection well.  No issues reported.  Patient talkative and in a good mood.

## 2020-10-07 MED ORDER — ROSUVASTATIN CALCIUM 20 MG PO TABS: 20.0000 mg | ORAL_TABLET | Freq: Every day | ORAL | 1 refills | Status: DC

## 2020-10-07 NOTE — Addendum Note (Signed)
Addended by: Aldine Contes on: 10/07/2020 02:33 PM   Modules accepted: Orders

## 2020-10-20 ENCOUNTER — Other Ambulatory Visit: Payer: Self-pay

## 2020-10-20 ENCOUNTER — Ambulatory Visit (INDEPENDENT_AMBULATORY_CARE_PROVIDER_SITE_OTHER): Payer: Medicare Other

## 2020-10-20 ENCOUNTER — Encounter (HOSPITAL_COMMUNITY): Payer: Self-pay

## 2020-10-20 DIAGNOSIS — F200 Paranoid schizophrenia: Secondary | ICD-10-CM | POA: Diagnosis not present

## 2020-10-20 NOTE — Progress Notes (Signed)
Pt in clinic today for due injection of Prolixin 25mg . Pt pleasant and cooperative on approach. Verbalizes no c/o. Injection prepared and then given in right LEFT upper outer quadrant. Pt to return in 2 weeks for next due injection.

## 2020-11-03 ENCOUNTER — Ambulatory Visit (INDEPENDENT_AMBULATORY_CARE_PROVIDER_SITE_OTHER): Payer: Medicare Other | Admitting: *Deleted

## 2020-11-03 ENCOUNTER — Other Ambulatory Visit: Payer: Self-pay

## 2020-11-03 ENCOUNTER — Encounter (HOSPITAL_COMMUNITY): Payer: Self-pay | Admitting: *Deleted

## 2020-11-03 VITALS — BP 148/92 | HR 92 | Resp 20 | Ht 73.0 in | Wt 221.0 lb

## 2020-11-03 DIAGNOSIS — F200 Paranoid schizophrenia: Secondary | ICD-10-CM

## 2020-11-03 MED ORDER — FLUPHENAZINE DECANOATE 25 MG/ML IJ SOLN
25.0000 mg | Freq: Once | INTRAMUSCULAR | Status: AC
  Administered 2020-11-03: 25 mg via INTRAMUSCULAR

## 2020-11-03 NOTE — Progress Notes (Signed)
Pt in clinic today for due Prolixin 25 mg injection. Pt cooperative and pleasant on approach. Injection prepared and given in RIGHT upper outer quadrent with no c/o. Pt to return in two weeks for next due injection.

## 2020-11-08 ENCOUNTER — Encounter (HOSPITAL_COMMUNITY): Payer: Self-pay | Admitting: Psychiatry

## 2020-11-08 ENCOUNTER — Telehealth (INDEPENDENT_AMBULATORY_CARE_PROVIDER_SITE_OTHER): Payer: Medicare Other | Admitting: Psychiatry

## 2020-11-08 ENCOUNTER — Other Ambulatory Visit: Payer: Self-pay

## 2020-11-08 DIAGNOSIS — G2401 Drug induced subacute dyskinesia: Secondary | ICD-10-CM

## 2020-11-08 DIAGNOSIS — F200 Paranoid schizophrenia: Secondary | ICD-10-CM

## 2020-11-08 MED ORDER — FLUPHENAZINE DECANOATE 25 MG/ML IJ SOLN: 25.0000 mg | INTRAMUSCULAR | 2 refills | Status: DC

## 2020-11-08 NOTE — Progress Notes (Signed)
Virtual Visit via Telephone Note  I connected with Wayne Schmidt on 11/08/20 at 11:00 AM EDT by telephone and verified that I am speaking with the correct person using two identifiers.  Location: Patient: home Provider: home office   I discussed the limitations, risks, security and privacy concerns of performing an evaluation and management service by telephone and the availability of in person appointments. I also discussed with the patient that there may be a patient responsible charge related to this service. The patient expressed understanding and agreed to proceed.   History of Present Illness: Patient is evaluated by phone session.  He is stable on his medication.  He is getting injection of Prolixin which is helping his paranoia and hallucination.  He feels that he needs medication to help with sleep and paranoia.  He is more acceptance to his illness and medication.  Recently he had a blood work and her hemoglobin A1c is 6.9.  Which is much better than before.  He is involved in church activities.  He admitted not able to visit his wife who lives in [place] because of legal issues.  Denies any anger, crying spells, mood swings or any suicidal thoughts.  He has mild tremors but does not want to take any medication.  He is also not interested in therapy.  He sleeps 8 hours.  He does go outside every day and he reported his energy level is okay.  Denies drinking or using any illegal substances.   Past Psychiatric History:Reviewed. H/Oschizophreniawith multiple inpatient treatment.H/O of hallucination, paranoia and suicidal attempt by overdose. [facility]TookHaldol and Thorazine buthad side effects.OnProlixin injection formanyyears.Tried hydroxyzineand Ingreezabut could not tolerate.  Recent Results (from the past 2160 hour(s))  Glucose, capillary     Status: Abnormal   Collection Time: 10/05/20 10:25 AM  Result Value Ref Range   Glucose-Capillary 151 (H)  70 - 99 mg/dL    Comment: Glucose reference range applies only to samples taken after fasting for at least 8 hours.  POC Hbg A1C     Status: Abnormal   Collection Time: 10/05/20 10:41 AM  Result Value Ref Range   Hemoglobin A1C 6.9 (A) 4.0 - 5.6 %   HbA1c POC (<> result, manual entry)     HbA1c, POC (prediabetic range)     HbA1c, POC (controlled diabetic range)    BMP8+Anion Gap     Status: Abnormal   Collection Time: 10/05/20 11:45 AM  Result Value Ref Range   Glucose 127 (H) 65 - 99 mg/dL   BUN 12 8 - 27 mg/dL   Creatinine, Ser 1.14 0.76 - 1.27 mg/dL   eGFR 71 >59 mL/min/1.73   BUN/Creatinine Ratio 11 10 - 24   Sodium 139 134 - 144 mmol/L   Potassium 4.6 3.5 - 5.2 mmol/L   Chloride 100 96 - 106 mmol/L   CO2 21 20 - 29 mmol/L   Anion Gap 18.0 10.0 - 18.0 mmol/L   Calcium 9.8 8.6 - 10.2 mg/dL  Lipid Profile     Status: Abnormal   Collection Time: 10/05/20 11:45 AM  Result Value Ref Range   Cholesterol, Total 196 100 - 199 mg/dL   Triglycerides 54 0 - 149 mg/dL   HDL 82 >39 mg/dL   VLDL Cholesterol Cal 10 5 - 40 mg/dL   LDL Chol Calc (NIH) 104 (H) 0 - 99 mg/dL   Chol/HDL Ratio 2.4 0.0 - 5.0 ratio    Comment:  T. Chol/HDL Ratio                                             Men  Women                               1/2 Avg.Risk  3.4    3.3                                   Avg.Risk  5.0    4.4                                2X Avg.Risk  9.6    7.1                                3X Avg.Risk 23.4   11.0      Psychiatric Specialty Exam: Physical Exam  Review of Systems  Weight 221 lb (100.2 kg).There is no height or weight on file to calculate BMI.  General Appearance: NA  Eye Contact:  NA  Speech:  Slow  Volume:  Decreased  Mood:  Euthymic  Affect:  NA  Thought Process:  Descriptions of Associations: Intact  Orientation:  Full (Time, Place, and Person)  Thought Content:  Paranoid Ideation  Suicidal Thoughts:  No  Homicidal Thoughts:  No   Memory:  Immediate;   Fair Recent;   Fair Remote;   Fair  Judgement:  Fair  Insight:  Fair  Psychomotor Activity:  NA  Concentration:  Concentration: Fair and Attention Span: Fair  Recall:  [employer] of Knowledge:  Fair  Language:  Fair  Akathisia:  No  Handed:  Right  AIMS (if indicated):     Assets:  Communication Skills Desire for Improvement Housing Resilience  ADL's:  Intact  Cognition:  WNL  Sleep:   good      Assessment and Plan: Schizophrenia chronic paranoid type.  Tardive dyskinesia.  Patient is stable on his current medication.  I reviewed blood work results.  Continue Prolixin 25 mg intramuscular every 2 weeks.  Discussed medication side effects and benefits.  Recommended to call us back if is any question or any concern.  Follow-up in 3 months.  Follow Up Instructions:    I discussed the assessment and treatment plan with the patient. The patient was provided an opportunity to ask questions and all were answered. The patient agreed with the plan and demonstrated an understanding of the instructions.   The patient was advised to call back or seek an in-person evaluation if the symptoms worsen or if the condition fails to improve as anticipated.  I provided 18 minutes of non-face-to-face time during this encounter.   [name], MD

## 2020-11-17 ENCOUNTER — Ambulatory Visit (INDEPENDENT_AMBULATORY_CARE_PROVIDER_SITE_OTHER): Payer: Medicare Other

## 2020-11-17 ENCOUNTER — Encounter (HOSPITAL_COMMUNITY): Payer: Self-pay

## 2020-11-17 ENCOUNTER — Other Ambulatory Visit: Payer: Self-pay

## 2020-11-17 DIAGNOSIS — F200 Paranoid schizophrenia: Secondary | ICD-10-CM

## 2020-11-17 MED ORDER — FLUPHENAZINE DECANOATE 25 MG/ML IJ SOLN
25.0000 mg | INTRAMUSCULAR | Status: DC
  Administered 2020-11-17 – 2023-09-18 (×74): 25 mg via INTRAMUSCULAR

## 2020-11-17 NOTE — Progress Notes (Signed)
Patient presented in clinic today for his due Prolixin 25mg  injection. Pt very pleasant and cooperative on approach. Injection prepared and given in LEFT deltoid. Pt stated that right arm is still sore but getting better. Pt to return in 2 weeks for next due injection

## 2020-12-01 ENCOUNTER — Other Ambulatory Visit: Payer: Self-pay

## 2020-12-01 ENCOUNTER — Ambulatory Visit (INDEPENDENT_AMBULATORY_CARE_PROVIDER_SITE_OTHER): Payer: Medicare Other

## 2020-12-01 ENCOUNTER — Encounter (HOSPITAL_COMMUNITY): Payer: Self-pay

## 2020-12-01 DIAGNOSIS — F200 Paranoid schizophrenia: Secondary | ICD-10-CM | POA: Diagnosis not present

## 2020-12-01 NOTE — Progress Notes (Signed)
Patient presented today for his due Fluphenazine Decanoate 25mg  injection. Patient very cooperative and pleasant upon approach. Injection prepared and given in LEFT upper outer quadrant. Patient stated that he only had 1 good arm today that wasn't hurting so he preferred gluteal today. Patient to return in 2 weeks for next due injection

## 2020-12-02 ENCOUNTER — Other Ambulatory Visit: Payer: Self-pay | Admitting: Internal Medicine

## 2020-12-02 DIAGNOSIS — I10 Essential (primary) hypertension: Secondary | ICD-10-CM

## 2020-12-07 ENCOUNTER — Other Ambulatory Visit: Payer: Self-pay | Admitting: Internal Medicine

## 2020-12-07 DIAGNOSIS — I10 Essential (primary) hypertension: Secondary | ICD-10-CM

## 2020-12-15 ENCOUNTER — Other Ambulatory Visit: Payer: Self-pay

## 2020-12-15 ENCOUNTER — Encounter (HOSPITAL_COMMUNITY): Payer: Self-pay

## 2020-12-15 ENCOUNTER — Ambulatory Visit (INDEPENDENT_AMBULATORY_CARE_PROVIDER_SITE_OTHER): Payer: Medicare Other

## 2020-12-15 DIAGNOSIS — F200 Paranoid schizophrenia: Secondary | ICD-10-CM

## 2020-12-15 NOTE — Progress Notes (Signed)
Patient presented today for his due Fluphenazine Decanoate 25mg /ml injection. Patient very pleasant and cooperative as normal. Injection prepared and given in RIGHT upper outer quadrant. Patient to return in 2 weeks for next injection.

## 2020-12-29 ENCOUNTER — Ambulatory Visit (INDEPENDENT_AMBULATORY_CARE_PROVIDER_SITE_OTHER): Payer: Medicare Other

## 2020-12-29 ENCOUNTER — Encounter (HOSPITAL_COMMUNITY): Payer: Self-pay

## 2020-12-29 ENCOUNTER — Other Ambulatory Visit: Payer: Self-pay

## 2020-12-29 VITALS — BP 118/80 | HR 96 | Temp 98.1°F | Ht 72.5 in | Wt 223.4 lb

## 2020-12-29 DIAGNOSIS — F200 Paranoid schizophrenia: Secondary | ICD-10-CM | POA: Diagnosis not present

## 2020-12-29 NOTE — Progress Notes (Deleted)
Patient presented today for his due Fluphenazine Dec 125mg /78ml injection. Patient very pleasant and cooperative as usual. Injection prepared and given in LEFT upper outer quadrant. Patient to return in 2 weeks for next injection.

## 2020-12-29 NOTE — Patient Instructions (Signed)
Patient presented today for his due Fluphenazine Dec 125mg /5ml injection. Patient very pleasant and cooperative as usual. Injection prepared and given in LEFT upper outer quadrant. Patient to return in 2 weeks for next injection

## 2020-12-29 NOTE — Progress Notes (Signed)
Note already documented.

## 2021-01-12 ENCOUNTER — Encounter (HOSPITAL_COMMUNITY): Payer: Self-pay

## 2021-01-12 ENCOUNTER — Other Ambulatory Visit: Payer: Self-pay

## 2021-01-12 ENCOUNTER — Ambulatory Visit (INDEPENDENT_AMBULATORY_CARE_PROVIDER_SITE_OTHER): Payer: Medicare Other

## 2021-01-12 DIAGNOSIS — F200 Paranoid schizophrenia: Secondary | ICD-10-CM

## 2021-01-12 NOTE — Patient Instructions (Addendum)
Patient presented today for his due Fluphenazine Dec 125mg /68ml injection. Patient very pleasant and cooperative upon approach. Injection prepared and given in RIGHT  Upper outer quadrant. Patient to return in approximately 2 weeks for next due injection

## 2021-01-26 ENCOUNTER — Other Ambulatory Visit: Payer: Self-pay

## 2021-01-26 ENCOUNTER — Encounter (HOSPITAL_COMMUNITY): Payer: Self-pay

## 2021-01-26 ENCOUNTER — Ambulatory Visit (INDEPENDENT_AMBULATORY_CARE_PROVIDER_SITE_OTHER): Payer: Medicare Other

## 2021-01-26 DIAGNOSIS — F200 Paranoid schizophrenia: Secondary | ICD-10-CM | POA: Diagnosis not present

## 2021-01-26 NOTE — Patient Instructions (Addendum)
Patient presented today for his due Fluphenazine Decanoate '25mg'$ /ml injection. Patient routinely pleasant and cooperative upon approach. Injection prepared and given in LEFT upper outer gluteal quadrant. Patient to return in approximately 2 weeks for next due injection

## 2021-02-09 ENCOUNTER — Ambulatory Visit (INDEPENDENT_AMBULATORY_CARE_PROVIDER_SITE_OTHER): Payer: Medicare Other

## 2021-02-09 ENCOUNTER — Encounter (HOSPITAL_COMMUNITY): Payer: Self-pay

## 2021-02-09 ENCOUNTER — Other Ambulatory Visit: Payer: Self-pay

## 2021-02-09 DIAGNOSIS — F200 Paranoid schizophrenia: Secondary | ICD-10-CM | POA: Diagnosis not present

## 2021-02-09 NOTE — Patient Instructions (Addendum)
Patient presented today for due Fluphenazine Decanoate '125mg'$  per 42m injection. Patient pleasant and cooperative upon approach per usual. He was saddened today by relatives not doing well in the hospital. Injection was prepared and given in RIGHT upper outer gluteal quadrant. Patient to return in 2 weeks for next injection

## 2021-02-10 ENCOUNTER — Other Ambulatory Visit: Payer: Self-pay | Admitting: Internal Medicine

## 2021-02-10 DIAGNOSIS — E1169 Type 2 diabetes mellitus with other specified complication: Secondary | ICD-10-CM

## 2021-02-11 ENCOUNTER — Telehealth (INDEPENDENT_AMBULATORY_CARE_PROVIDER_SITE_OTHER): Payer: Medicare Other | Admitting: Psychiatry

## 2021-02-11 ENCOUNTER — Other Ambulatory Visit: Payer: Self-pay

## 2021-02-11 ENCOUNTER — Encounter (HOSPITAL_COMMUNITY): Payer: Self-pay | Admitting: Psychiatry

## 2021-02-11 DIAGNOSIS — F200 Paranoid schizophrenia: Secondary | ICD-10-CM

## 2021-02-11 DIAGNOSIS — G2401 Drug induced subacute dyskinesia: Secondary | ICD-10-CM

## 2021-02-11 MED ORDER — FLUPHENAZINE DECANOATE 25 MG/ML IJ SOLN: 25.0000 mg | INTRAMUSCULAR | 2 refills | Status: DC

## 2021-02-11 NOTE — Progress Notes (Signed)
Virtual Visit via Telephone Note  I connected with Wayne Schmidt on 02/11/21 at 10:00 AM EDT by telephone and verified that I am speaking with the correct person using two identifiers.  Location: Patient: Home Provider: Home Office   I discussed the limitations, risks, security and privacy concerns of performing an evaluation and management service by telephone and the availability of in person appointments. I also discussed with the patient that there may be a patient responsible charge related to this service. The patient expressed understanding and agreed to proceed.   History of Present Illness: Patient is evaluated by phone session.  Today he admitted somewhat sad because his 66 year old brother died 2 days ago.  Patient told he had health issues and he decided do not intubated and he died in the hospital.  He is not sure when it is a funeral but he had a good support from his family and friends.  Today he told me first time that he is in therapy at [facility] for past few years and he like to keep his therapy with Gabriel Rainwater.  He understand that his brother is in a better place but he still have some time grief but denies any crying spells, feeling of hopelessness or worthlessness.  He is sleeping okay.  He denies any hallucination, paranoia but admitted talking to God helps him.  Patient has a strong faith and he is involved in church activities.  Patient remains somewhat guarded but denies any mood swing, homicidal or suicidal thoughts.  He like to keep his current medication.  He has mild tremors but he does not want to take any medication.    Past Psychiatric History: Reviewed. H/O schizophrenia with multiple inpatient treatment. H/O of hallucination, paranoia and suicidal attempt by overdose.  Last inpatient at [facility]. Took Haldol and Thorazine but had side effects.  On Prolixin injection for many years.  Tried hydroxyzine and Ingreeza but could not tolerate.  Psychiatric Specialty  Exam: Physical Exam  Review of Systems  Weight 224 lb (101.6 kg).There is no height or weight on file to calculate BMI.  General Appearance: NA  Eye Contact:  NA  Speech:  Slow  Volume:  Decreased  Mood:  Dysphoric  Affect:  NA  Thought Process:  Descriptions of Associations: Intact  Orientation:  Full (Time, Place, and Person)  Thought Content:  Rumination  Suicidal Thoughts:  No  Homicidal Thoughts:  No  Memory:  Immediate;   Fair Recent;   Fair Remote;   Fair  Judgement:  Intact  Insight:  Shallow  Psychomotor Activity:  NA  Concentration:  Concentration: Fair and Attention Span: Fair  Recall:  [employer] of Knowledge:  Fair  Language:  Fair  Akathisia:  No  Handed:  Right  AIMS (if indicated):     Assets:  Communication Skills Desire for Improvement Housing Social Support Transportation  ADL's:  Intact  Cognition:  WNL  Sleep:   ok      Assessment and Plan: Schizophrenia chronic paranoid type.  Tardive dyskinesia.  Discussed recent loss of his brother and offered grief counseling but patient feels that he is fine and if needed he will talk to his therapist at [facility] .  Does not want to change the medication.  We will continue Prolixin 25 mg intramuscular every 2 weeks.  I recommend to call us back if is any question or any concern.  Follow-up in 3 months.  Follow Up Instructions:    I discussed the  assessment and treatment plan with the patient. The patient was provided an opportunity to ask questions and all were answered. The patient agreed with the plan and demonstrated an understanding of the instructions.   The patient was advised to call back or seek an in-person evaluation if the symptoms worsen or if the condition fails to improve as anticipated.  I provided 20 minutes of non-face-to-face time during  this Encounter.   [name], MD

## 2021-02-15 ENCOUNTER — Ambulatory Visit (HOSPITAL_COMMUNITY)
Admission: RE | Admit: 2021-02-15 | Discharge: 2021-02-15 | Disposition: A | Discharge status: Home / Self Care | Payer: Medicare Other | Source: Ambulatory Visit | Attending: Internal Medicine | Admitting: Internal Medicine

## 2021-02-15 ENCOUNTER — Ambulatory Visit (INDEPENDENT_AMBULATORY_CARE_PROVIDER_SITE_OTHER): Payer: Medicare Other | Admitting: Internal Medicine

## 2021-02-15 VITALS — BP 144/86 | HR 70 | Temp 98.5°F | Ht 72.0 in | Wt 228.1 lb

## 2021-02-15 DIAGNOSIS — N401 Enlarged prostate with lower urinary tract symptoms: Secondary | ICD-10-CM

## 2021-02-15 DIAGNOSIS — R351 Nocturia: Secondary | ICD-10-CM

## 2021-02-15 DIAGNOSIS — Z0000 Encounter for general adult medical examination without abnormal findings: Secondary | ICD-10-CM

## 2021-02-15 DIAGNOSIS — E1169 Type 2 diabetes mellitus with other specified complication: Secondary | ICD-10-CM | POA: Diagnosis not present

## 2021-02-15 DIAGNOSIS — I10 Essential (primary) hypertension: Secondary | ICD-10-CM | POA: Diagnosis not present

## 2021-02-15 DIAGNOSIS — R079 Chest pain, unspecified: Secondary | ICD-10-CM | POA: Insufficient documentation

## 2021-02-15 DIAGNOSIS — E785 Hyperlipidemia, unspecified: Secondary | ICD-10-CM | POA: Diagnosis not present

## 2021-02-15 DIAGNOSIS — F200 Paranoid schizophrenia: Secondary | ICD-10-CM

## 2021-02-15 LAB — GLUCOSE, CAPILLARY: Glucose-Capillary: 138 mg/dL — ABNORMAL HIGH (ref 70–99)

## 2021-02-15 LAB — POCT GLYCOSYLATED HEMOGLOBIN (HGB A1C): Hemoglobin A1C: 7 % — AB (ref 4.0–5.6)

## 2021-02-15 NOTE — Assessment & Plan Note (Addendum)
Lab Results  Component Value Date   HGBA1C 7.0 (A) 02/15/2021   HGBA1C 6.9 (A) 10/05/2020   HGBA1C 7.8 (A) 06/22/2020     Assessment: Diabetes control:  Well-controlled Progress toward A1C goal:   At goal Comments: Patient is compliant with Jardiance 25 mg daily  Plan: Medications:  continue current medications Home glucose monitoring: Frequency:   Timing:   Instruction/counseling given: discussed foot care Educational resources provided:   Self management tools provided:   Other plans: Foot exam done today.  Patient has 2+ dorsalis pedis pulses on the right foot but only 1+ on the left.  Will discuss obtaining ABIs at his next visit

## 2021-02-15 NOTE — Assessment & Plan Note (Signed)
-   This problem is chronic and stable -Patient was transitioned to rosuvastatin 20 mg daily and his pravastatin was DC'd -However, on review of his medications today he is taking both pravastatin and rosuvastatin -We will need to call his pharmacy and make sure they only dispense the rosuvastatin and not the pravastatin -No further work-up at this time

## 2021-02-15 NOTE — Patient Instructions (Signed)
-  It was a pleasure seeing you today -We will get an EKG on you today and refer you for a stress test given your intermittent left-sided chest pain -Your blood pressure is elevated today.  It remains elevated we will need to add another medication to help lower the blood pressure -Please try to reduce the amount of cigarettes you are smoking -We will also need to call the pharmacy that is dispensing her medications and correct the medication list that they have -Please call me if you have any questions or concerns or if you need any refills

## 2021-02-15 NOTE — Assessment & Plan Note (Signed)
-   Patient received his COVID booster after his last visit

## 2021-02-15 NOTE — Assessment & Plan Note (Signed)
-   This problem is chronic and stable -Patient continues to have tangential thoughts and flight of ideas -He follows up with psychiatry and receives fluphenazine every 2 weeks -No further work-up at this time

## 2021-02-15 NOTE — Assessment & Plan Note (Signed)
BP Readings from Last 3 Encounters:  02/15/21 (!) 157/81  02/09/21 135/86  01/26/21 120/80    Lab Results  Component Value Date   NA 139 10/05/2020   K 4.6 10/05/2020   CREATININE 1.14 10/05/2020    Assessment: Blood pressure control:  Uncontrolled Progress toward BP goal:   Deteriorated Comments: Patient is compliant with losartan 100 mg daily and doxazosin 2 mg daily.  His repeat blood pressure did improve with SBP's in the 140s  Plan: Medications:  continue current medications Educational resources provided:   Self management tools provided:   Other plans: Patient does not want to add on medication at this time.  We will discuss this with him at his next visit.  If his blood pressure remains elevated at that time we will consider starting him on amlodipine 2.5 mg daily.  Of note, patient's last 2 blood pressures done at behavioral health were within normal limits

## 2021-02-15 NOTE — Progress Notes (Signed)
   Subjective:    Patient ID: Wayne Schmidt, male    DOB: 1956/03/13, 65 y.o.   MRN: [id number]  Diabetes Hypoglycemia symptoms include headaches. Associated symptoms include chest pain.  Headache    I have seen and examined this patient.  Patient is here for routine follow-up of his hypertension and diabetes.  Patient states that he does have intermittent chest pain.  Patient states that over the last couple months he has noted left sided chest pain which he describes as sharp to dull lasting less than a minute at a time and occurring approximately 1-2 times a week.  He states that these episodes occur while he is at rest and sitting down.  Patient also complains of some headaches that occur at night and are relieved by ibuprofen. He denies any other complaints at this time.  He is compliant with all his medications.  Review of Systems  Constitutional: Negative.   HENT: Negative.    Respiratory: Negative.    Cardiovascular:  Positive for chest pain.  Gastrointestinal: Negative.   Musculoskeletal: Negative.   Skin: Negative.   Neurological:  Positive for headaches.  Psychiatric/Behavioral:         Tangential thought at times      Objective:   Physical Exam Constitutional:      Appearance: He is well-developed.  HENT:     Head: Normocephalic and atraumatic.     Mouth/Throat:     Mouth: Mucous membranes are moist.     Pharynx: Oropharynx is clear.  Cardiovascular:     Rate and Rhythm: Normal rate and regular rhythm.     Pulses:          Dorsalis pedis pulses are 2+ on the right side and 1+ on the left side.     Heart sounds: Normal heart sounds.  Pulmonary:     Effort: Pulmonary effort is normal.     Breath sounds: Normal breath sounds. No wheezing or rales.  Abdominal:     General: Bowel sounds are normal. There is no distension.     Palpations: Abdomen is soft.     Tenderness: There is no abdominal tenderness.  Musculoskeletal:        General: No swelling or  tenderness.     Cervical back: Neck supple.  Lymphadenopathy:     Cervical: No cervical adenopathy.  Neurological:     General: No focal deficit present.     Mental Status: He is alert and oriented to person, place, and time.  Psychiatric:        Mood and Affect: Mood normal.     Comments: Tangential thoughts at times          Assessment & Plan:  Please see problem based charting for assessment and plan:

## 2021-02-15 NOTE — Assessment & Plan Note (Signed)
-   This problem is new - Patient states that over the last couple months he has noted left sided chest pain which he describes as sharp to dull lasting less than a minute at a time and occurring approximately 1-2 times a week.  He states that these episodes occur while he is at rest and sitting down -This is very atypical for cardiac chest pain but given his history of hypertension, diabetes and current tobacco use we will obtain EKG and an outpatient stress test -EKG done showed normal sinus rhythm with no acute ST/T wave changes -Order placed for outpatient stress test -No further work-up at this time

## 2021-02-15 NOTE — Assessment & Plan Note (Signed)
-   This problem is chronic and stable -Patient is currently on Cardura for this issue.  We will need to discuss starting him on Flomax again although he has refused this in the past -We will also discuss risks and benefits of PSA screening with patient at his next visit given that he is African-American and 65 years of age with nocturia. -No further work-up

## 2021-02-16 ENCOUNTER — Telehealth: Payer: Self-pay | Admitting: *Deleted

## 2021-02-16 NOTE — Telephone Encounter (Signed)
Call to [facility] spoke to Pharmacist -Pravastatin has been discontinued for patient per order of Dr. Dareen Piano.  Sander Nephew, RN 02/15/2021 11:05 AM.

## 2021-02-23 ENCOUNTER — Other Ambulatory Visit: Payer: Self-pay

## 2021-02-23 ENCOUNTER — Ambulatory Visit (INDEPENDENT_AMBULATORY_CARE_PROVIDER_SITE_OTHER): Payer: Medicare Other | Admitting: *Deleted

## 2021-02-23 ENCOUNTER — Encounter (HOSPITAL_COMMUNITY): Payer: Self-pay | Admitting: *Deleted

## 2021-02-23 VITALS — BP 154/95 | HR 88 | Temp 97.5°F | Resp 18 | Ht 73.0 in | Wt 222.0 lb

## 2021-02-23 DIAGNOSIS — F200 Paranoid schizophrenia: Secondary | ICD-10-CM

## 2021-02-23 NOTE — Progress Notes (Signed)
Pt in office today for due Prolixin 25 mg injection. Pt is pleasant and cooperative on approach. Affect bright. Injection prepared as ordered and given in left upper outer quadrant without complaint. Pt to return in two weeks for next due injection.

## 2021-03-03 ENCOUNTER — Telehealth: Payer: Self-pay | Admitting: Internal Medicine

## 2021-03-03 NOTE — Addendum Note (Signed)
Addended by: Aldine Contes on: 03/03/2021 04:53 PM   Modules accepted: Orders

## 2021-03-03 NOTE — Telephone Encounter (Signed)
Pt came in today asking about his Stress Test Referral.    Please advise if you can place a new Cardiology Referral for the requested Stress Test per his last OV on 02/15/2021.

## 2021-03-09 ENCOUNTER — Encounter (HOSPITAL_COMMUNITY): Payer: Self-pay | Admitting: *Deleted

## 2021-03-09 ENCOUNTER — Other Ambulatory Visit: Payer: Self-pay

## 2021-03-09 ENCOUNTER — Ambulatory Visit (INDEPENDENT_AMBULATORY_CARE_PROVIDER_SITE_OTHER): Payer: Medicare Other | Admitting: *Deleted

## 2021-03-09 VITALS — BP 139/83 | HR 91 | Temp 97.8°F | Resp 20 | Ht 73.0 in | Wt 233.6 lb

## 2021-03-09 DIAGNOSIS — F200 Paranoid schizophrenia: Secondary | ICD-10-CM | POA: Diagnosis not present

## 2021-03-09 NOTE — Progress Notes (Signed)
Pt in clinic this morning for due Prolixin 25 mg injection. Pt bright, pleasant and cooperative on approach.injection prepared as ordered and given in Right Upper outer quadrant without compliant. VSS. Pt to return in 2 weeks for next due injection. Pt verbalizes understanding.

## 2021-03-23 ENCOUNTER — Other Ambulatory Visit: Payer: Self-pay

## 2021-03-23 ENCOUNTER — Ambulatory Visit (INDEPENDENT_AMBULATORY_CARE_PROVIDER_SITE_OTHER): Payer: Medicare Other

## 2021-03-23 ENCOUNTER — Encounter (HOSPITAL_COMMUNITY): Payer: Self-pay

## 2021-03-23 DIAGNOSIS — F200 Paranoid schizophrenia: Secondary | ICD-10-CM

## 2021-03-23 NOTE — Patient Instructions (Addendum)
Patient presented today for his due Fluphenazine Decanoate 25mg  injection. Patient very pleasant and cooperative upon approach. Injection was prepared and given as ordered in LEFT upper outer gluteal quadrant. Patient to return in 2 weeks for next due injection

## 2021-04-06 ENCOUNTER — Ambulatory Visit (HOSPITAL_BASED_OUTPATIENT_CLINIC_OR_DEPARTMENT_OTHER): Payer: Medicare Other | Admitting: *Deleted

## 2021-04-06 ENCOUNTER — Other Ambulatory Visit: Payer: Self-pay

## 2021-04-06 VITALS — BP 137/78 | Temp 97.8°F | Resp 18 | Ht 73.0 in | Wt 223.6 lb

## 2021-04-06 DIAGNOSIS — F200 Paranoid schizophrenia: Secondary | ICD-10-CM | POA: Diagnosis not present

## 2021-04-06 NOTE — Patient Instructions (Signed)
Pt presents today for due Prolixin 25 mg injection. Pt not as bright today with c/o feeling "sluggish" due to medication regime. Cooperative and pleasant on approach. Medication prepared as ordered and given in right upper outer quadrant without compliant. Pt to return in two weeks for next due injection.

## 2021-04-16 ENCOUNTER — Other Ambulatory Visit: Payer: Self-pay | Admitting: Internal Medicine

## 2021-04-16 DIAGNOSIS — E785 Hyperlipidemia, unspecified: Secondary | ICD-10-CM

## 2021-04-16 DIAGNOSIS — I10 Essential (primary) hypertension: Secondary | ICD-10-CM

## 2021-04-18 NOTE — Telephone Encounter (Signed)
Next appt scheduled 06/14/21 with PCP. 

## 2021-04-20 ENCOUNTER — Encounter (HOSPITAL_COMMUNITY): Payer: Self-pay

## 2021-04-20 ENCOUNTER — Other Ambulatory Visit: Payer: Self-pay

## 2021-04-20 ENCOUNTER — Ambulatory Visit (HOSPITAL_BASED_OUTPATIENT_CLINIC_OR_DEPARTMENT_OTHER): Payer: Medicare Other

## 2021-04-20 DIAGNOSIS — F200 Paranoid schizophrenia: Secondary | ICD-10-CM | POA: Diagnosis not present

## 2021-04-20 NOTE — Patient Instructions (Addendum)
Patient presented today for his due Fluphenazine Decanoate 125mg /24ml injection. Patient very pleasant and cooperative as always. Injection prepared and given  in his LEFT UPPER OUTER QUADRANT without complaint. Patient to return in 2 weeks for next due injection

## 2021-05-02 DIAGNOSIS — H25013 Cortical age-related cataract, bilateral: Secondary | ICD-10-CM | POA: Diagnosis not present

## 2021-05-02 DIAGNOSIS — E119 Type 2 diabetes mellitus without complications: Secondary | ICD-10-CM | POA: Diagnosis not present

## 2021-05-02 DIAGNOSIS — H52203 Unspecified astigmatism, bilateral: Secondary | ICD-10-CM | POA: Diagnosis not present

## 2021-05-02 DIAGNOSIS — H2513 Age-related nuclear cataract, bilateral: Secondary | ICD-10-CM | POA: Diagnosis not present

## 2021-05-02 LAB — HM DIABETES EYE EXAM

## 2021-05-04 ENCOUNTER — Encounter (HOSPITAL_COMMUNITY): Payer: Self-pay

## 2021-05-04 ENCOUNTER — Ambulatory Visit (HOSPITAL_BASED_OUTPATIENT_CLINIC_OR_DEPARTMENT_OTHER): Payer: Medicare Other

## 2021-05-04 ENCOUNTER — Other Ambulatory Visit: Payer: Self-pay

## 2021-05-04 DIAGNOSIS — F200 Paranoid schizophrenia: Secondary | ICD-10-CM | POA: Diagnosis not present

## 2021-05-04 NOTE — Patient Instructions (Addendum)
Patient presented today for due Fluphenazine Decanoate 125mg /36ml injection. Pt very pleasant and cooperative. Patient needs new vial for next visit. Took the last 31ml from existing vial for today's visit. Patient aware and will bring new vial for next visit. Medication prepared and given as ordered in [place] without complaint. Patient to return in 2 weeks for next due injection

## 2021-05-06 ENCOUNTER — Telehealth: Payer: Self-pay | Admitting: *Deleted

## 2021-05-06 ENCOUNTER — Encounter (INDEPENDENT_AMBULATORY_CARE_PROVIDER_SITE_OTHER): Payer: Self-pay

## 2021-05-06 ENCOUNTER — Ambulatory Visit (INDEPENDENT_AMBULATORY_CARE_PROVIDER_SITE_OTHER): Payer: Medicare Other | Admitting: Interventional Cardiology

## 2021-05-06 ENCOUNTER — Encounter: Payer: Self-pay | Admitting: Interventional Cardiology

## 2021-05-06 ENCOUNTER — Other Ambulatory Visit: Payer: Self-pay

## 2021-05-06 VITALS — BP 114/80 | HR 71 | Ht 73.0 in | Wt 224.0 lb

## 2021-05-06 DIAGNOSIS — I200 Unstable angina: Secondary | ICD-10-CM | POA: Diagnosis not present

## 2021-05-06 DIAGNOSIS — Z720 Tobacco use: Secondary | ICD-10-CM

## 2021-05-06 DIAGNOSIS — R072 Precordial pain: Secondary | ICD-10-CM | POA: Diagnosis not present

## 2021-05-06 DIAGNOSIS — E782 Mixed hyperlipidemia: Secondary | ICD-10-CM

## 2021-05-06 DIAGNOSIS — I10 Essential (primary) hypertension: Secondary | ICD-10-CM | POA: Diagnosis not present

## 2021-05-06 MED ORDER — METOPROLOL TARTRATE 100 MG PO TABS: 100.0000 mg | ORAL_TABLET | ORAL | 0 refills | Status: DC

## 2021-05-06 NOTE — Telephone Encounter (Signed)
Made 3 attempts to reach patient to let him know that I needed to cancel the CT that he scheduled for today at check out. I went ahead and cancelled it but needed to let him know that someone would be in contact with him to get it scheduled from the new order.

## 2021-05-06 NOTE — Patient Instructions (Signed)
Medication Instructions:  Your physician recommends that you continue on your current medications as directed. Please refer to the Current Medication list given to you today.  *If you need a refill on your cardiac medications before your next appointment, please call your pharmacy*   Lab Work: BMET today If you have labs (blood work) drawn today and your tests are completely normal, you will receive your results only by: [place] (if you have MyChart) OR A paper copy in the mail If you have any lab test that is abnormal or we need to change your treatment, we will call you to review the results.   Testing/Procedures:   Your cardiac CT will be scheduled at one of the below locations:   [facility] [address] [place], [place] [address] [phone]  [place] [address] [place], [place] [address] [phone]  If scheduled at [facility], please arrive at the [facility] main entrance (entrance A) of [facility] 30 minutes prior to test start time. You can use the FREE valet parking offered at the main entrance (encouraged to control the heart rate for the test) Proceed to the [facility] Radiology Department (first floor) to check-in and test prep.  If scheduled at [facility], please arrive 15 mins early for check-in and test prep.  Please follow these instructions carefully (unless otherwise directed):  Hold all erectile dysfunction medications at least 3 days (72 hrs) prior to test.  On the Night Before the Test: Be sure to Drink plenty of water. Do not consume any caffeinated/decaffeinated beverages or chocolate 12 hours prior to your test. Do not take any antihistamines 12 hours prior to your test. If the patient has contrast allergy: Patient will need a prescription for Prednisone and very clear instructions (as follows): Prednisone 50 mg - take  13 hours prior to test Take another Prednisone 50 mg 7 hours prior to test Take another Prednisone 50 mg 1 hour prior to test Take Benadryl 50 mg 1 hour prior to test Patient must complete all four doses of above prophylactic medications. Patient will need a ride after test due to Benadryl.  On the Day of the Test: Drink plenty of water until 1 hour prior to the test. Do not eat any food 4 hours prior to the test. You may take your regular medications prior to the test.  Take metoprolol (Lopressor) two hours prior to test.        After the Test: Drink plenty of water. After receiving IV contrast, you may experience a mild flushed feeling. This is normal. On occasion, you may experience a mild rash up to 24 hours after the test. This is not dangerous. If this occurs, you can take Benadryl 25 mg and increase your fluid intake. If you experience trouble breathing, this can be serious. If it is severe call 911 IMMEDIATELY. If it is mild, please call our office. If you take any of these medications: Glipizide/Metformin, Avandament, Glucavance, please do not take 48 hours after completing test unless otherwise instructed.  Please allow 2-4 weeks for scheduling of routine cardiac CTs. Some insurance companies require a pre-authorization which may delay scheduling of this test.   For non-scheduling related questions, please contact the cardiac imaging nurse navigator should you have any questions/concerns: Marchia Bond, Cardiac Imaging Nurse Navigator Gordy Clement, Cardiac Imaging Nurse Navigator [facility] Direct Office Dial: [phone]  For scheduling needs, including cancellations and rescheduling, please call [place], [phone].    Follow-Up: At [facility], you and your health needs are our priority.  As part of our continuing mission to provide you with exceptional heart care, we have created designated Provider Care Teams.  These Care Teams  include your primary Cardiologist (physician) and Advanced Practice Providers (APPs -  Physician Assistants and Nurse Practitioners) who all work together to provide you with the care you need, when you need it.  We recommend signing up for the patient portal called "MyChart".  Sign up information is provided on this After Visit Summary.  MyChart is used to connect with patients for Virtual Visits (Telemedicine).  Patients are able to view lab/test results, encounter notes, upcoming appointments, etc.  Non-urgent messages can be sent to your provider as well.   To learn more about what you can do with MyChart, go to [email or web address].    Your next appointment:   Follow up will be based on test results ,of your CT

## 2021-05-06 NOTE — Progress Notes (Signed)
Cardiology Office Note   Date:  05/06/2021   ID:  Wayne Schmidt, DOB January 17, 1956, MRN [id number]  PCP:  Aldine Contes, MD    No chief complaint on file.  Chest pain  Wt Readings from Last 3 Encounters:  05/06/21 224 lb (101.6 kg)  02/15/21 228 lb 1.6 oz (103.5 kg)  10/05/20 225 lb 3.2 oz (102.2 kg)       History of Present Illness: Wayne Schmidt is a 65 y.o. male who is being seen today for the evaluation of chest pain at the request of Aldine Contes, MD.   He has a history of hypertension and diabetes.  He also has a history of paranoid schizophrenia and is medically managed.  He had been eating a lot of chocolate.  CP seems to increase with more chocolate intake.  Does not feel like acid reflux.  He also noted that when he smokes, he can feel some chest discomfort.  Walking is most strenuous activity.  CP is not consistently associated with exertion.   Occasional dizziness with coughing.   Denies : Leg edema. Nitroglycerin use. Orthopnea. Palpitations. Paroxysmal nocturnal dyspnea. Shortness of breath. Syncope.     Past Medical History:  Diagnosis Date   Blood creatinine increased compared with prior measurement 07/29/2019   Diabetes mellitus without complication ([place])    High risk sexual behavior 10/2007   Hypertension    Microcytosis    Schizophrenia ([place])    Substance abuse ([place])    tobacco use   UTI (urinary tract infection) 08/24/2016   Admitted 08/23/16-08/26/16 with several day history of UTI symptoms + positive UA. Culture resulted pan-sensitive E.coli. Was treated with IV Ceftriaxone and d/c home with Bactrim to complete 10 day course.     Past Surgical History:  Procedure Laterality Date   LAMINECTOMY     by Dr Rolin Barry     Current Outpatient Medications  Medication Sig Dispense Refill   doxazosin (CARDURA) 2 MG tablet TAKE (1) TABLET BY MOUTH ONCE DAILY. 30 tablet 11   fluPHENAZine decanoate (PROLIXIN) 25 MG/ML injection Inject 1 mL (25 mg  total) into the muscle every 14 (fourteen) days. 5 mL 2   hydroxypropyl methylcellulose / hypromellose (ISOPTO TEARS / GONIOVISC) 2.5 % ophthalmic solution Place 2 drops into both eyes daily as needed for dry eyes.     JARDIANCE 25 MG TABS tablet TAKE (1) TABLET BY MOUTH ONCE DAILY BEFORE BREAKFAST. 30 tablet 11   losartan (COZAAR) 100 MG tablet TAKE (1) TABLET BY MOUTH ONCE DAILY. 30 tablet 11   metoprolol tartrate (LOPRESSOR) 100 MG tablet Take 1 tablet (100 mg total) by mouth as directed. Two hours prior to your CT 1 tablet 0   rosuvastatin (CRESTOR) 20 MG tablet TAKE (1) TABLET BY MOUTH ONCE DAILY. 30 tablet 11   VOLTAREN 1 % GEL Apply 2 g topically 4 (four) times daily. 20 g 10   Current Facility-Administered Medications  Medication Dose Route Frequency Provider Last Rate Last Admin   fluPHENAZine decanoate (PROLIXIN) injection 25 mg  25 mg Intramuscular Q14 Days Arfeen, Arlyce Harman, MD   25 mg at 05/04/21 1126    Allergies:   Ace inhibitors, Atorvastatin, Chlorpromazine hcl, and Simvastatin    Social History:  The patient  reports that he has been smoking cigarettes. He has a 28.50 pack-year smoking history. He has never used smokeless tobacco. He reports current alcohol use. He reports that he does not use drugs.   Family History:  The patient's  family history includes Diabetes in his mother; Drug abuse in his brother; Hypertension in his mother; Lupus in his sister; Sickle cell anemia in his brother.    ROS:  Please see the history of present illness.   Otherwise, review of systems are positive for occasional chest tightness.   All other systems are reviewed and negative.    PHYSICAL EXAM: VS:  BP 114/80   Pulse 71   Ht 6\' 1"  (1.854 m)   Wt 224 lb (101.6 kg)   SpO2 94%   BMI 29.55 kg/m  , BMI Body mass index is 29.55 kg/m. GEN: Well nourished, well developed, in no acute distress HEENT: normal Neck: no JVD, carotid bruits, or masses Cardiac: RRR; no murmurs, rubs, or gallops,no  edema  Respiratory:  clear to auscultation bilaterally, normal work of breathing GI: soft, nontender, nondistended, + BS MS: no deformity or atrophy Skin: warm and dry, no rash Neuro:  Strength and sensation are intact Psych: euthymic mood, full affect   EKG:   The ekg ordered in August 2022 demonstrates normal sinus rhythm, no ST changes, PAC   Recent Labs: 10/05/2020: BUN 12; Creatinine, Ser 1.14; Potassium 4.6; Sodium 139   Lipid Panel    Component Value Date/Time   CHOL 196 10/05/2020 1145   TRIG 54 10/05/2020 1145   HDL 82 10/05/2020 1145   CHOLHDL 2.4 10/05/2020 1145   CHOLHDL 2.9 04/01/2014 1408   VLDL 11 04/01/2014 1408   LDLCALC 104 (H) 10/05/2020 1145     Other studies Reviewed: Additional studies/ records that were reviewed today with results demonstrating: Cr 1.14 in 10/2020.   ASSESSMENT AND PLAN:  Chest pain: Plan for coronary CTA to evaluate for obstructive coronary artery disease.  He has several risk factors for coronary artery disease. Hypertension: Low-salt diet.  Avoid processed foods. Losartan was stopped, due to perceived ineffectiveness and side effects. Diabetes:Hemoglobin A1c 7.0.  Continue whole food, plant-based diet.  Increase fiber intake.  Exercise target as noted below. Hyperlipidemia: He was switched to high intensity statin, rosuvastatin 20 mg daily in April 2022. Tobacco abuse:  patch did not work in the past. He is not ready to quit at this time.    Current medicines are reviewed at length with the patient today.  The patient concerns regarding his medicines were addressed.  The following changes have been made:  No change  Labs/ tests ordered today include:   Orders Placed This Encounter  Procedures   CT ANGIO CHEST AORTA W/CM & OR WO/CM   Basic metabolic panel   EKG 63-ZCHY     Recommend 150 minutes/week of aerobic exercise Low fat, low carb, high fiber diet recommended  Disposition:   FU based on CT scan  results   Signed, Larae Grooms, MD  05/06/2021 3:08 [facility] [place], [place], [place]  [address] Phone: [phone]; Fax: [phone]

## 2021-05-06 NOTE — Addendum Note (Signed)
Addended by: Juventino Slovak on: 05/06/2021 03:25 PM   Modules accepted: Orders

## 2021-05-07 LAB — BASIC METABOLIC PANEL
BUN/Creatinine Ratio: 13 (ref 10–24)
BUN: 15 mg/dL (ref 8–27)
CO2: 24 mmol/L (ref 20–29)
Calcium: 9.5 mg/dL (ref 8.6–10.2)
Chloride: 103 mmol/L (ref 96–106)
Creatinine, Ser: 1.12 mg/dL (ref 0.76–1.27)
Glucose: 98 mg/dL (ref 70–99)
Potassium: 4.7 mmol/L (ref 3.5–5.2)
Sodium: 143 mmol/L (ref 134–144)
eGFR: 72 mL/min/{1.73_m2} (ref >59)

## 2021-05-12 ENCOUNTER — Encounter: Payer: Self-pay | Admitting: Dietician

## 2021-05-13 ENCOUNTER — Other Ambulatory Visit: Payer: Self-pay

## 2021-05-13 ENCOUNTER — Telehealth (HOSPITAL_BASED_OUTPATIENT_CLINIC_OR_DEPARTMENT_OTHER): Payer: Medicare Other | Admitting: Psychiatry

## 2021-05-13 ENCOUNTER — Encounter (HOSPITAL_COMMUNITY): Payer: Self-pay | Admitting: Psychiatry

## 2021-05-13 DIAGNOSIS — F200 Paranoid schizophrenia: Secondary | ICD-10-CM

## 2021-05-13 DIAGNOSIS — G2401 Drug induced subacute dyskinesia: Secondary | ICD-10-CM

## 2021-05-13 MED ORDER — FLUPHENAZINE DECANOATE 25 MG/ML IJ SOLN: 25.0000 mg | INTRAMUSCULAR | 2 refills | Status: DC

## 2021-05-13 NOTE — Progress Notes (Signed)
Virtual Visit via Telephone Note  I connected with Wayne Schmidt on 05/13/21 at 10:00 AM EST by telephone and verified that I am speaking with the correct person using two identifiers.  Location: Patient: Home Provider: Home Office   I discussed the limitations, risks, security and privacy concerns of performing an evaluation and management service by telephone and the availability of in person appointments. I also discussed with the patient that there may be a patient responsible charge related to this service. The patient expressed understanding and agreed to proceed.   History of Present Illness: Patient is evaluated by phone session.  Patient told his wife is now rehab after having a stroke 2 weeks ago.  He sleeps fair as frequent awakening to go to the bathroom.  He denies any hallucination but remained preoccupied with his religious thoughts.  He has been delusions, anger, agitation.  He is trying to get in touch with his children so he can do Thanksgiving together.  He admitted sometime forgetful and memory is not as good but still able to drive if needed.  He has tremors but does not want to take any medication.  He reported it does not interfere in his daily activities.  He admitted talking to God helps his anxiety and paranoia.  He is getting injection Prolixin regularly.  His weight is stable.  His appetite is okay.  His last hemoglobin A1c is 7 which is stable.  He like to keep the current medication.  Past Psychiatric History: Reviewed. H/O schizophrenia with multiple inpatient treatment. H/O of hallucination, paranoia and suicidal attempt by overdose.  Last inpatient at [facility]. Took Haldol and Thorazine but had side effects.  On Prolixin injection for many years.  Tried hydroxyzine and Ingreeza but could not tolerate.  Recent Results (from the past 2160 hour(s))  Glucose, capillary     Status: Abnormal   Collection Time: 02/15/21 10:47 AM  Result Value Ref Range   Glucose-Capillary  138 (H) 70 - 99 mg/dL    Comment: Glucose reference range applies only to samples taken after fasting for at least 8 hours.  POC Hbg A1C     Status: Abnormal   Collection Time: 02/15/21 10:58 AM  Result Value Ref Range   Hemoglobin A1C 7.0 (A) 4.0 - 5.6 %   HbA1c POC (<> result, manual entry)     HbA1c, POC (prediabetic range)     HbA1c, POC (controlled diabetic range)    HM DIABETES EYE EXAM     Status: None   Collection Time: 05/02/21 12:00 AM  Result Value Ref Range   HM Diabetic Eye Exam    HM DIABETES EYE EXAM     Status: None   Collection Time: 05/02/21 12:00 AM  Result Value Ref Range   HM Diabetic Eye Exam No Retinopathy No Retinopathy    Comment: Done by Dr. Katy Apo, MD  Basic metabolic panel     Status: None   Collection Time: 05/06/21  3:02 PM  Result Value Ref Range   Glucose 98 70 - 99 mg/dL   BUN 15 8 - 27 mg/dL   Creatinine, Ser 1.12 0.76 - 1.27 mg/dL   eGFR 72 >59 mL/min/1.73   BUN/Creatinine Ratio 13 10 - 24   Sodium 143 134 - 144 mmol/L   Potassium 4.7 3.5 - 5.2 mmol/L   Chloride 103 96 - 106 mmol/L   CO2 24 20 - 29 mmol/L   Calcium 9.5 8.6 - 10.2 mg/dL  Psychiatric Specialty Exam: Physical Exam  Review of Systems  Weight 224 lb (101.6 kg).There is no height or weight on file to calculate BMI.  General Appearance: NA  Eye Contact:  NA  Speech:  Slow  Volume:  Decreased  Mood:  Dysphoric  Affect:  NA  Thought Process:  Descriptions of Associations: Circumstantial  Orientation:  Full (Time, Place, and Person)  Thought Content:   religious preoccupied, no delusion or paranoia  Suicidal Thoughts:  No  Homicidal Thoughts:  No  Memory:  Immediate;   Fair Recent;   Fair Remote;   Fair  Judgement:  Fair  Insight:  Shallow  Psychomotor Activity:  Tremor  Concentration:  Concentration: Fair and Attention Span: Fair  Recall:  [employer] of Knowledge:  Fair  Language:  Fair  Akathisia:  No  Handed:  Right  AIMS (if indicated):     Assets:   Communication Skills Desire for Improvement Housing Transportation  ADL's:  Intact  Cognition:  WNL  Sleep:   fair, frequent awakening to go bathroom      Assessment and Plan: Schizophrenia chronic paranoid type.  Tardive dyskinesia.  I reviewed blood work results.  Hemoglobin A1c 7 which is stable.  He does not want to change the medication.  He worried about his wife who had a recently stroke and in rehab.  Reassurance given.  I offered therapy but patient does not want therapy and does not want to change the medication.  I will continue Prolixin 25 mg intramuscular every 2 weeks.  Recommended to call us back if is any question or any concern.  Follow-up in 3 months.  Follow Up Instructions:    I discussed the assessment and treatment plan with the patient. The patient was provided an opportunity to ask questions and all were answered. The patient agreed with the plan and demonstrated an understanding of the instructions.   The patient was advised to call back or seek an in-person evaluation if the symptoms worsen or if the condition fails to improve as anticipated.  I provided 16 minutes of non-face-to-face time during this encounter.   [name], MD

## 2021-05-18 ENCOUNTER — Encounter (HOSPITAL_COMMUNITY): Payer: Self-pay

## 2021-05-18 ENCOUNTER — Ambulatory Visit (HOSPITAL_BASED_OUTPATIENT_CLINIC_OR_DEPARTMENT_OTHER): Payer: Medicare Other

## 2021-05-18 ENCOUNTER — Other Ambulatory Visit: Payer: Self-pay

## 2021-05-18 ENCOUNTER — Telehealth: Payer: Self-pay | Admitting: Internal Medicine

## 2021-05-18 DIAGNOSIS — F200 Paranoid schizophrenia: Secondary | ICD-10-CM

## 2021-05-18 NOTE — Telephone Encounter (Signed)
Patient requesting clarification on his medications.  Transferred call to triage.

## 2021-05-18 NOTE — Telephone Encounter (Signed)
Patient wanted to know why PCP took him off losartan and pravastatin, then restarted losartan and added rosuvastatin. Tried to explain that PCP wanted him to continue losartan and switched pravastatin for rosuvastatin at [place] on 8/16. Tried explaining losartan is for BP and rosuvastatin is for cholesterol. Patient stated that his PCP "He's a liar and tells me one thing and does another; he's willy nilly with my chart and is trying to keep me doped up." Patient with known hx of paranoid schizophrenia. Attempted to redirect patient to no avail. Patient made aware that this RN would be ending call. Patient has upcoming appt with PCP on 12/13.

## 2021-05-18 NOTE — Patient Instructions (Addendum)
Patient presented today for his due Fluphenazine Dec 125mg /12ml injection. Patient very pleasant and cooperative upon approach as usual. Medication prepared and given as ordered in [place]. Patient to return in 2 weeks for next due injection.

## 2021-05-18 NOTE — Telephone Encounter (Signed)
Pt requesting clarification on his medications. Pt then states , "I am tired of the Clerical "B---S---" with your office and my medications."  Pt was then asked to hold on to speak with the triage nurse about his medication.  Called transferred to Howell Rucks, RN.

## 2021-05-27 ENCOUNTER — Telehealth (HOSPITAL_COMMUNITY): Payer: Self-pay | Admitting: Emergency Medicine

## 2021-05-27 NOTE — Telephone Encounter (Signed)
Reaching out to patient to offer assistance regarding upcoming cardiac imaging study; pt verbalizes understanding of appt date/time, parking situation and where to check in, pre-test NPO status and medications ordered, and verified current allergies; name and call back number provided for further questions should they arise Wayne Bond RN Navigator Cardiac Imaging [facility] [phone] office [phone] cell  100mg  metoprolol tartrate Denies iv issues Arrival 245p

## 2021-05-30 ENCOUNTER — Other Ambulatory Visit: Payer: Self-pay

## 2021-05-30 ENCOUNTER — Other Ambulatory Visit: Payer: Medicare Other

## 2021-05-30 ENCOUNTER — Ambulatory Visit (HOSPITAL_COMMUNITY)
Admission: RE | Admit: 2021-05-30 | Discharge: 2021-05-30 | Disposition: A | Discharge status: Home / Self Care | Payer: Medicare Other | Source: Ambulatory Visit | Attending: Interventional Cardiology | Admitting: Interventional Cardiology

## 2021-05-30 ENCOUNTER — Encounter (HOSPITAL_COMMUNITY): Payer: Self-pay

## 2021-05-30 DIAGNOSIS — I200 Unstable angina: Secondary | ICD-10-CM

## 2021-05-30 MED ORDER — IOHEXOL 350 MG/ML SOLN
95.0000 mL | Freq: Once | INTRAVENOUS | Status: AC | PRN
  Administered 2021-05-30: 16:00:00 95 mL via INTRAVENOUS

## 2021-05-30 MED ORDER — IOHEXOL 350 MG/ML SOLN: 95.0000 mL | Freq: Once | INTRAVENOUS | Status: DC | PRN

## 2021-05-30 MED ORDER — NITROGLYCERIN 0.4 MG SL SUBL
0.8000 mg | SUBLINGUAL_TABLET | Freq: Once | SUBLINGUAL | Status: AC
  Administered 2021-05-30: 15:00:00 0.8 mg via SUBLINGUAL

## 2021-05-30 MED ORDER — NITROGLYCERIN 0.4 MG SL SUBL
SUBLINGUAL_TABLET | SUBLINGUAL | Status: AC
  Filled 2021-05-30: qty 2

## 2021-06-01 ENCOUNTER — Ambulatory Visit (HOSPITAL_BASED_OUTPATIENT_CLINIC_OR_DEPARTMENT_OTHER): Payer: Medicare Other | Admitting: *Deleted

## 2021-06-01 ENCOUNTER — Encounter (HOSPITAL_COMMUNITY): Payer: Self-pay | Admitting: *Deleted

## 2021-06-01 ENCOUNTER — Other Ambulatory Visit: Payer: Self-pay

## 2021-06-01 VITALS — BP 154/92 | HR 72 | Temp 98.5°F | Ht 72.0 in | Wt 221.8 lb

## 2021-06-01 DIAGNOSIS — F200 Paranoid schizophrenia: Secondary | ICD-10-CM | POA: Diagnosis not present

## 2021-06-01 NOTE — Patient Instructions (Signed)
Pt presents today for due Prolixin 25 mg injection. Pt pleasant an cooperative on approach. Injection prepared as ordered and given in right upper outer quadrant without c/o. Pt to return in 2 weeks for next due injection.

## 2021-06-14 ENCOUNTER — Ambulatory Visit (INDEPENDENT_AMBULATORY_CARE_PROVIDER_SITE_OTHER): Payer: Medicare Other | Admitting: Internal Medicine

## 2021-06-14 VITALS — BP 143/79 | HR 95 | Temp 98.3°F | Ht 73.0 in | Wt 224.2 lb

## 2021-06-14 DIAGNOSIS — E782 Mixed hyperlipidemia: Secondary | ICD-10-CM

## 2021-06-14 DIAGNOSIS — F17200 Nicotine dependence, unspecified, uncomplicated: Secondary | ICD-10-CM

## 2021-06-14 DIAGNOSIS — R079 Chest pain, unspecified: Secondary | ICD-10-CM

## 2021-06-14 DIAGNOSIS — I10 Essential (primary) hypertension: Secondary | ICD-10-CM

## 2021-06-14 DIAGNOSIS — R351 Nocturia: Secondary | ICD-10-CM

## 2021-06-14 DIAGNOSIS — E1169 Type 2 diabetes mellitus with other specified complication: Secondary | ICD-10-CM | POA: Diagnosis not present

## 2021-06-14 DIAGNOSIS — F200 Paranoid schizophrenia: Secondary | ICD-10-CM

## 2021-06-14 DIAGNOSIS — N401 Enlarged prostate with lower urinary tract symptoms: Secondary | ICD-10-CM

## 2021-06-14 DIAGNOSIS — Z23 Encounter for immunization: Secondary | ICD-10-CM

## 2021-06-14 DIAGNOSIS — Z0000 Encounter for general adult medical examination without abnormal findings: Secondary | ICD-10-CM

## 2021-06-14 LAB — POCT GLYCOSYLATED HEMOGLOBIN (HGB A1C): Hemoglobin A1C: 7.3 % — AB (ref 4.0–5.6)

## 2021-06-14 LAB — GLUCOSE, CAPILLARY: Glucose-Capillary: 142 mg/dL — ABNORMAL HIGH (ref 70–99)

## 2021-06-14 NOTE — Patient Instructions (Signed)
-  It was a pleasure seeing you today. Have a great holiday season! -Your BP is close to goal. Keep up the good work -Please continue to try and decrease the amount you are smoking -Please continue with your cholesterol medication -Think about the prostate test and we can do this at your next visit. If it is high we will need to refer you to urology for a prostate biopsy -We will give you a flu shot today -We will do a foot exam today -Please call me with any questions or concerns or if you need any refills

## 2021-06-14 NOTE — Assessment & Plan Note (Signed)
-   Patient states that he still smokes a little less than 1 pack/day -We discussed the importance of decreasing his smoking especially given that he did have mild CAD on recent CTA coronaries -He expressed understanding and states that he will continue to work on decreasing the amount of cigarettes he smokes -No further work-up at this time

## 2021-06-14 NOTE — Assessment & Plan Note (Signed)
-   This problem is chronic and stable -Patient continues to have tangential thoughts -He follows up with psychiatry for this and receives fluphenazine every 2 weeks -We will continue with current regimen at this time -No further work-up for now

## 2021-06-14 NOTE — Assessment & Plan Note (Signed)
-   This problem is chronic and stable -Patient is currently on Cardura for BPH.  He states that he still is symptomatic and has nocturia.  Patient does not want to restart Flomax again -We did discuss getting a PSA for prostate cancer screening but he would like to think about this.  We will discuss this again at his next visit we will be getting blood work to see if he would like to proceed with this.  I did explain to him that if his PSA is elevated he would need to be referred to urology for possible prostate biopsy. -We will continue to monitor closely

## 2021-06-14 NOTE — Assessment & Plan Note (Signed)
-   Patient was noted to have chest pain when I evaluated him back in August and referred him to cardiology for further evaluation -He denies any chest pain currently -He did have a CTA coronaries which showed minimal CAD in the left main and LAD and an elevated coronary calcium score as well as aortic atherosclerosis -Patient is on rosuvastatin 20 mg daily.  He would benefit from addition of aspirin 81 mg daily.  We will discuss this with him at his next visit -No further work-up at this time

## 2021-06-14 NOTE — Assessment & Plan Note (Addendum)
-   This problem is chronic and stable -Patient states that he was initially on pravastatin and was switched to rosuvastatin and was uncertain why it was changed.  We had discussed this at his prior visit but we did discuss that his cholesterol was elevated and that he needed to be on a different medication to help decrease his cholesterol more -He expressed understanding and is in agreement to take rosuvastatin 20 mg daily -We will recheck lipid panel at his next visit -No further work-up at this time

## 2021-06-14 NOTE — Assessment & Plan Note (Signed)
Lab Results  Component Value Date   HGBA1C 7.3 (A) 06/14/2021   HGBA1C 7.0 (A) 02/15/2021   HGBA1C 6.9 (A) 10/05/2020     Assessment: Diabetes control:  Fair Progress toward A1C goal:   Mildly deteriorated Comments: Patient is compliant with Jardiance 25 mg daily  Plan: Medications:  continue current medications Home glucose monitoring: Frequency:   Timing:   Instruction/counseling given: discussed diet Educational resources provided:   Self management tools provided:   Other plans: Patient deferred foot exam to next visit.  We discussed the importance of diet with the patient and attempting to bring the A1c down below 7.  We will recheck at next visit

## 2021-06-14 NOTE — Progress Notes (Signed)
° °  Subjective:    Patient ID: Wayne Schmidt, male    DOB: [birth year], 65 y.o.   MRN: [id number]  Diabetes   I have seen and examined this patient.  Patient is here for routine follow-up of his hypertension and diabetes.  Patient was concerned about being restarted on his losartan at his last visit as well as being switched to rosuvastatin.  He denied any other complaints at this time and states that he is compliant with his medications currently.  Review of Systems  Constitutional: Negative.   HENT: Negative.    Respiratory: Negative.    Cardiovascular: Negative.   Gastrointestinal: Negative.   Musculoskeletal: Negative.   Neurological: Negative.   Psychiatric/Behavioral: Negative.        Objective:   Physical Exam Constitutional:      Appearance: Normal appearance.  HENT:     Head: Normocephalic and atraumatic.  Eyes:     Pupils: Pupils are equal, round, and reactive to light.  Cardiovascular:     Rate and Rhythm: Normal rate and regular rhythm.     Heart sounds: Normal heart sounds.  Pulmonary:     Breath sounds: Normal breath sounds. No wheezing or rales.  Abdominal:     General: Bowel sounds are normal. There is no distension.     Palpations: Abdomen is soft.     Tenderness: There is no abdominal tenderness.  Musculoskeletal:        General: No swelling or tenderness.     Cervical back: Neck supple.  Lymphadenopathy:     Cervical: No cervical adenopathy.  Neurological:     Mental Status: He is alert and oriented to person, place, and time.  Psychiatric:        Mood and Affect: Mood normal.        Behavior: Behavior normal.     Comments: Does have tangential thought          Assessment & Plan:   Please see problem based charting for assessment and plan:

## 2021-06-14 NOTE — Assessment & Plan Note (Signed)
-   Flu shot given today -We will give him a tetanus booster at his next visit

## 2021-06-14 NOTE — Assessment & Plan Note (Signed)
-   Patient noted to have a mildly elevated systolic blood pressure in the 140s -He is compliant with losartan 100 mg daily as well as doxazosin 2 mg daily -After discussion with patient we will continue with current medications at this time.  If his blood pressures remain persistently elevated we will need to consider adding amlodipine 2.5 mg daily. -Patient expressed understanding and is in agreement with plan.

## 2021-06-15 ENCOUNTER — Ambulatory Visit (HOSPITAL_BASED_OUTPATIENT_CLINIC_OR_DEPARTMENT_OTHER): Payer: Medicare Other

## 2021-06-15 ENCOUNTER — Other Ambulatory Visit: Payer: Self-pay

## 2021-06-15 ENCOUNTER — Encounter (HOSPITAL_COMMUNITY): Payer: Self-pay

## 2021-06-15 DIAGNOSIS — F200 Paranoid schizophrenia: Secondary | ICD-10-CM | POA: Diagnosis not present

## 2021-06-15 NOTE — Patient Instructions (Addendum)
Patient presented today for his due Fluphenazine Decanoate 125mg /54ml injection. Patient wasn't as jovial as he normally is. Pt stated that he's "feeling down" today but nothing serious. Pt not having SI. Pt stated that he's still social so that helps. Medication prepared and given as ordered in [place]. Patient to return in 2 weeks for next due injection

## 2021-06-29 ENCOUNTER — Other Ambulatory Visit: Payer: Self-pay

## 2021-06-29 ENCOUNTER — Ambulatory Visit (HOSPITAL_BASED_OUTPATIENT_CLINIC_OR_DEPARTMENT_OTHER): Payer: Medicare Other | Admitting: *Deleted

## 2021-06-29 ENCOUNTER — Encounter (HOSPITAL_COMMUNITY): Payer: Self-pay | Admitting: *Deleted

## 2021-06-29 VITALS — BP 161/84 | HR 105 | Temp 98.7°F | Ht 73.0 in | Wt 223.0 lb

## 2021-06-29 DIAGNOSIS — F200 Paranoid schizophrenia: Secondary | ICD-10-CM | POA: Diagnosis not present

## 2021-06-29 NOTE — Patient Instructions (Addendum)
Pt in clinic today for due Prolixin 25 mg injection. Pt presents as more subdued than usual and became tearful when talking about his son. Injection prepared and given as ordered in right upper outer quadrant without complaint. Pt to return for next due injection in two weeks. Pt will call with any questions or concerns he says.

## 2021-07-13 ENCOUNTER — Other Ambulatory Visit: Payer: Self-pay

## 2021-07-13 ENCOUNTER — Ambulatory Visit (HOSPITAL_BASED_OUTPATIENT_CLINIC_OR_DEPARTMENT_OTHER): Payer: 59

## 2021-07-13 ENCOUNTER — Encounter (HOSPITAL_COMMUNITY): Payer: Self-pay

## 2021-07-13 DIAGNOSIS — F200 Paranoid schizophrenia: Secondary | ICD-10-CM

## 2021-07-13 NOTE — Patient Instructions (Addendum)
Patient presented today for his due Fluphenazine Dec 125mg /73ml injection. Patient very cooperative and pleasant upon approach and more himself today. Medication prepared and give as ordered in [place]. Patient to return in 2 weeks for next due injection.

## 2021-07-19 ENCOUNTER — Encounter: Payer: Medicare Other | Admitting: Internal Medicine

## 2021-07-27 ENCOUNTER — Ambulatory Visit (HOSPITAL_BASED_OUTPATIENT_CLINIC_OR_DEPARTMENT_OTHER): Payer: 59 | Admitting: *Deleted

## 2021-07-27 ENCOUNTER — Other Ambulatory Visit: Payer: Self-pay

## 2021-07-27 ENCOUNTER — Encounter (HOSPITAL_COMMUNITY): Payer: Self-pay | Admitting: *Deleted

## 2021-07-27 VITALS — BP 130/90 | HR 88 | Temp 98.7°F | Ht 74.0 in | Wt 220.6 lb

## 2021-07-27 DIAGNOSIS — F200 Paranoid schizophrenia: Secondary | ICD-10-CM | POA: Diagnosis not present

## 2021-07-27 NOTE — Patient Instructions (Signed)
Pt in office today for due Prolixin 25 mg injection. Pt pleasant and cooperative on approach. Injection prepared and given as ordered in right upper outer quadrant without complaints. Pt to return in 2 weeks for next due injection.

## 2021-08-10 ENCOUNTER — Ambulatory Visit (HOSPITAL_BASED_OUTPATIENT_CLINIC_OR_DEPARTMENT_OTHER): Payer: Medicare Other

## 2021-08-10 ENCOUNTER — Encounter (HOSPITAL_COMMUNITY): Payer: Self-pay

## 2021-08-10 ENCOUNTER — Other Ambulatory Visit: Payer: Self-pay

## 2021-08-10 DIAGNOSIS — F200 Paranoid schizophrenia: Secondary | ICD-10-CM

## 2021-08-10 NOTE — Patient Instructions (Addendum)
Patient presented today for his due Fluphenazine Dec 125mg /31ml injection. Pt was pleasant and cooperative upon approach. Pt was in a jovial mood despite his wife living in a rehab facility and having health problems. Pt stated he thinks he's going to see her today. Medication prepared and injection given in [place]. Patient to return in 2 weeks for next due injection

## 2021-08-12 ENCOUNTER — Telehealth (HOSPITAL_BASED_OUTPATIENT_CLINIC_OR_DEPARTMENT_OTHER): Payer: Medicare Other | Admitting: Psychiatry

## 2021-08-12 ENCOUNTER — Encounter (HOSPITAL_COMMUNITY): Payer: Self-pay | Admitting: Psychiatry

## 2021-08-12 ENCOUNTER — Other Ambulatory Visit: Payer: Self-pay

## 2021-08-12 DIAGNOSIS — G2401 Drug induced subacute dyskinesia: Secondary | ICD-10-CM | POA: Diagnosis not present

## 2021-08-12 DIAGNOSIS — F200 Paranoid schizophrenia: Secondary | ICD-10-CM | POA: Diagnosis not present

## 2021-08-12 MED ORDER — FLUPHENAZINE DECANOATE 25 MG/ML IJ SOLN: 25.0000 mg | INTRAMUSCULAR | 2 refills | Status: DC

## 2021-08-12 NOTE — Progress Notes (Signed)
Virtual Visit via Telephone Note  I connected with Wayne Schmidt on 08/12/21 at 10:20 AM EST by telephone and verified that I am speaking with the correct person using two identifiers.  Location: Patient: Home Provider: Home Office   I discussed the limitations, risks, security and privacy concerns of performing an evaluation and management service by telephone and the availability of in person appointments. I also discussed with the patient that there may be a patient responsible charge related to this service. The patient expressed understanding and agreed to proceed.   History of Present Illness: Patient is evaluated by phone session.  He is superficially cooperative but taking Prolixin injection on time.  He appears tangential and religiously preoccupied but denies any hallucination or any suicidal thoughts.  He admitted there are nights when he has to bathroom but able to get few hours of sleep.  He does not like taking medication but understand it is necessary and recommended.  Recently he had a blood work and his hemoglobin A1c is 7.3.  He had a quiet Christmas.  He did visit his family in [place].  His wife had a stroke and slowly and gradually she is recovering.  Patient told recently her granddaughter has twin and he is celebrating to become great grandfather.  Denies any anger and appears calm on the phone but his thought process remains tangential.  He has tremors but does not want to take any medication.  He admitted praying to God regularly and that helps him.  His appetite is okay.  His weight is stable.   Past Psychiatric History: Reviewed. H/O schizophrenia with multiple inpatient treatment. H/O of hallucination, paranoia and suicidal attempt by overdose.  Last inpatient at [facility]. Took Haldol and Thorazine but had side effects.  On Prolixin injection for many years.  Tried hydroxyzine and Ingreeza but could not tolerate.  Recent Results (from the past 2160 hour(s))  Glucose,  capillary     Status: Abnormal   Collection Time: 06/14/21 10:25 AM  Result Value Ref Range   Glucose-Capillary 142 (H) 70 - 99 mg/dL    Comment: Glucose reference range applies only to samples taken after fasting for at least 8 hours.  POC Hbg A1C     Status: Abnormal   Collection Time: 06/14/21 10:39 AM  Result Value Ref Range   Hemoglobin A1C 7.3 (A) 4.0 - 5.6 %   HbA1c POC (<> result, manual entry)     HbA1c, POC (prediabetic range)     HbA1c, POC (controlled diabetic range)       Psychiatric Specialty Exam: Physical Exam  Review of Systems  Weight 223 lb (101.2 kg).There is no height or weight on file to calculate BMI.  General Appearance: NA  Eye Contact:  NA  Speech:  Slow  Volume:  Decreased  Mood:  Dysphoric  Affect:  NA  Thought Process:  Descriptions of Associations: Circumstantial  Orientation:  Full (Time, Place, and Person)  Thought Content:  Tangential and preoccupied with religion  Suicidal Thoughts:  No  Homicidal Thoughts:  No  Memory:  Immediate;   Fair Recent;   Fair Remote;   Fair  Judgement:  Fair  Insight:  Shallow  Psychomotor Activity:  NA  Concentration:  Concentration: Fair and Attention Span: Fair  Recall:  [employer] of Knowledge:  Fair  Language:  Fair  Akathisia:   tremors  Handed:  Right  AIMS (if indicated):     Assets:  Desire for Improvement Housing Transportation  ADL's:  Intact  Cognition:  WNL  Sleep:   fair      Assessment and Plan: Schizophrenia chronic paranoid type.  Tardive dyskinesia.  Continue Prolixin 25 mg intramuscular every 2 weeks.  We have discussed increasing the dose but patient is very reluctant and refused.  I reviewed blood work results.  Congratulations given to become the great grandfather.  Recommended to call us back if is any question or any concern.  Follow-up in 3 months.  Follow Up Instructions:    I discussed the assessment and treatment plan with the patient. The patient was provided an  opportunity to ask questions and all were answered. The patient agreed with the plan and demonstrated an understanding of the instructions.   The patient was advised to call back or seek an in-person evaluation if the symptoms worsen or if the condition fails to improve as anticipated.  I provided 21 minutes of non-face-to-face time during this encounter.   [name], MD

## 2021-08-24 ENCOUNTER — Encounter (HOSPITAL_COMMUNITY): Payer: Self-pay

## 2021-08-24 ENCOUNTER — Ambulatory Visit (HOSPITAL_BASED_OUTPATIENT_CLINIC_OR_DEPARTMENT_OTHER): Payer: Medicare Other

## 2021-08-24 ENCOUNTER — Other Ambulatory Visit: Payer: Self-pay

## 2021-08-24 DIAGNOSIS — F200 Paranoid schizophrenia: Secondary | ICD-10-CM | POA: Diagnosis not present

## 2021-08-24 NOTE — Patient Instructions (Addendum)
Patient presented today for his due Fluphenazine Dec 125mg /7ml injection. Patient was not his normal joyful self today and not talkative as he normally is; however, was still pleasant and cooperative. Medication was prepared and given as ordered in [place]. Patient to return in 2 weeks for next due injection

## 2021-09-07 ENCOUNTER — Encounter (HOSPITAL_COMMUNITY): Payer: Self-pay

## 2021-09-07 ENCOUNTER — Other Ambulatory Visit: Payer: Self-pay

## 2021-09-07 ENCOUNTER — Ambulatory Visit (HOSPITAL_BASED_OUTPATIENT_CLINIC_OR_DEPARTMENT_OTHER): Payer: Medicare Other

## 2021-09-07 DIAGNOSIS — F200 Paranoid schizophrenia: Secondary | ICD-10-CM

## 2021-09-07 NOTE — Patient Instructions (Addendum)
Patient presented today for his due Fluphenazine Dec '125mg'$ /64m injection. Patient was cooperative and pleasant upon approach and in better spirits than he was at his last appointment. He's worried about his wife who has multiple health issues but stated that he has a good support system. Medication was ordered and given in [place] Patient to return in 2 weeks for next due injection ?

## 2021-09-20 ENCOUNTER — Ambulatory Visit (INDEPENDENT_AMBULATORY_CARE_PROVIDER_SITE_OTHER): Payer: Medicare Other | Admitting: Internal Medicine

## 2021-09-20 VITALS — BP 148/84 | HR 73 | Temp 98.2°F | Ht 73.0 in | Wt 221.5 lb

## 2021-09-20 DIAGNOSIS — N401 Enlarged prostate with lower urinary tract symptoms: Secondary | ICD-10-CM | POA: Diagnosis not present

## 2021-09-20 DIAGNOSIS — E1169 Type 2 diabetes mellitus with other specified complication: Secondary | ICD-10-CM | POA: Diagnosis not present

## 2021-09-20 DIAGNOSIS — R351 Nocturia: Secondary | ICD-10-CM

## 2021-09-20 DIAGNOSIS — E782 Mixed hyperlipidemia: Secondary | ICD-10-CM | POA: Diagnosis not present

## 2021-09-20 DIAGNOSIS — I10 Essential (primary) hypertension: Secondary | ICD-10-CM

## 2021-09-20 DIAGNOSIS — F200 Paranoid schizophrenia: Secondary | ICD-10-CM

## 2021-09-20 DIAGNOSIS — Z23 Encounter for immunization: Secondary | ICD-10-CM

## 2021-09-20 DIAGNOSIS — R972 Elevated prostate specific antigen [PSA]: Secondary | ICD-10-CM

## 2021-09-20 DIAGNOSIS — Z0000 Encounter for general adult medical examination without abnormal findings: Secondary | ICD-10-CM

## 2021-09-20 LAB — POCT GLYCOSYLATED HEMOGLOBIN (HGB A1C): Hemoglobin A1C: 6.9 % — AB (ref 4.0–5.6)

## 2021-09-20 LAB — GLUCOSE, CAPILLARY: Glucose-Capillary: 118 mg/dL — ABNORMAL HIGH (ref 70–99)

## 2021-09-20 NOTE — Progress Notes (Signed)
? ?  Subjective:  ? ? Patient ID: Wayne Schmidt, male    DOB: Nov 26, 1955, 66 y.o.   MRN: [id number] ? ?Diabetes ? ? ?I have seen and examined this patient.  Patient is here for routine follow-up of his hypertension and diabetes. ? ?Patient states that he was worried that rosuvastatin was causing some toxicity for him since it contains calcium and he thought his calcium level was high and stopped this medication for a while.  He did state that he resumed his medication recently.  He complains of nocturia and states that he has to wake up every 3-4 hours to urinate.  He denies any other complaints at this time and states that he feels well otherwise ? ?Review of Systems  ?Constitutional: Negative.   ?HENT: Negative.    ?Respiratory: Negative.    ?Cardiovascular: Negative.   ?Gastrointestinal: Negative.   ?Genitourinary:   ?     Complains of nocturia  ?Musculoskeletal: Negative.   ?Neurological: Negative.   ?Psychiatric/Behavioral: Negative.    ? ?   ?Objective:  ? Physical Exam ?Constitutional:   ?   Appearance: Normal appearance.  ?HENT:  ?   Head: Normocephalic and atraumatic.  ?   Mouth/Throat:  ?   Pharynx: Oropharynx is clear. No oropharyngeal exudate.  ?Eyes:  ?   Conjunctiva/sclera: Conjunctivae normal.  ?Cardiovascular:  ?   Rate and Rhythm: Normal rate and regular rhythm.  ?   Heart sounds: Normal heart sounds. No murmur heard. ?Pulmonary:  ?   Effort: Pulmonary effort is normal. No respiratory distress.  ?   Breath sounds: Normal breath sounds. No wheezing.  ?Abdominal:  ?   General: Bowel sounds are normal. There is no distension.  ?   Palpations: Abdomen is soft.  ?   Tenderness: There is no abdominal tenderness.  ?Musculoskeletal:     ?   General: No swelling or tenderness.  ?   Cervical back: Neck supple.  ?Lymphadenopathy:  ?   Cervical: No cervical adenopathy.  ?Neurological:  ?   General: No focal deficit present.  ?   Mental Status: He is alert and oriented to person, place, and time.  ?Psychiatric:      ?   Mood and Affect: Mood normal.     ?   Behavior: Behavior normal.  ? ? ? ? ? ?   ?Assessment & Plan:  ? ?Please see problem based charting for assessment and plan: ?

## 2021-09-20 NOTE — Assessment & Plan Note (Signed)
-   Tdap given today ?-Patient states that he is still due for COVID booster and will get this at his pharmacy ?-No further work-up at this time ?

## 2021-09-20 NOTE — Assessment & Plan Note (Signed)
-   Patient continues to have tangential thoughts ?-He follows up with psychiatry and receives fluphenazine every 2 weeks ?-No further work-up at this time ?

## 2021-09-20 NOTE — Assessment & Plan Note (Signed)
BP Readings from Last 3 Encounters:  ?09/20/21 (!) 148/84  ?06/14/21 (!) 143/79  ?05/30/21 (!) 165/94  ? ? ?Lab Results  ?Component Value Date  ? NA 143 05/06/2021  ? K 4.7 05/06/2021  ? CREATININE 1.12 05/06/2021  ? ? ?Assessment: ?Blood pressure control:  Fair ?Progress toward BP goal:   Unchanged ?Comments: Patient still noted to have a mildly elevated blood pressure with systolic blood pressures in the 140s.  He is compliant with his losartan 100 mg daily as well as his Cardura 2 mg daily.  He feels that increased blood pressure is normal with age and does not want to change his regimen currently.  Discussed with the patient that given his diabetes and history of mild CAD it would be prudent to maintain better blood pressures.  We will discuss this with him at his next visit ? ?Plan: ?Medications:  continue current medications ?Educational resources provided:   ?Actor tools provided:   ?Other plans: We will check BMP today ?

## 2021-09-20 NOTE — Assessment & Plan Note (Addendum)
-   With problems chronic and stable ?-Given patient's mild CAD he was transitioned to high intensity statin (rosuvastatin 20 mg daily) ?-Patient states that he stopped this transiently because he is worried it was causing him toxicity secondary to the calcium component ?-I explained the patient that his calcium level is normal and that rosuvastatin would not cause calcium toxicity ?-We will check his CMP today to monitor his LFTs given the change in the statin ?-We will check lipid panel today as well ?-No further work-up for now ? ?Addendum: ?-Patient's LFTs are within normal limits and lipid panel is within normal limits except for mildly elevated LDL of 101 ?-We will continue patient on rosuvastatin 20 mg daily ?-No further work-up at this time ?

## 2021-09-20 NOTE — Patient Instructions (Signed)
-  It was a pleasure seeing you today ?-We will check some blood work on you today including a cholesterol panel, liver function as well as kidney function ?-Given your increased urination at night we will check a PSA level on you as well.  If this PSA level is elevated we will need to refer you to urology for further evaluation ?-We will get a foot exam on you today ?-Your diabetes is well controlled.  Keep up the great work! ?-Please call me if you have any questions or concerns or if you need any refills ?

## 2021-09-20 NOTE — Assessment & Plan Note (Addendum)
-   This problem is chronic and worsening ?-Patient is currently on Cardura for BPH.  He does not want to restart Flomax at this time ?-Given patient has worsening nocturia and has to wake up every couple of hours to use the restroom we discussed checking a PSA.  Shared decision-making was done and the risks and benefits of doing a PSA was discussed ?-Patient is agreeable to PSA and understands that he may need to be referred to urology for prostate biopsy if his PSA is elevated ?-We will check a PSA ? ?Addendum: ?-Patient have an elevated PSA of 14.6.  I called the patient to discuss results of his elevated PSA with him.  Patient states that he does not want to follow-up with urology.  Explained to him that based on our discussions yesterday get agreed to a urology referral if his PSA was elevated and that was the reason that we got the PSA.  Patient states that he does not want to follow-up with PSA and states that he does "not have prostate cancer".  I explained to him that the PSA was just a screening test and he may not have prostate cancer but that he should follow-up with the urologist for further evaluation.  Patient then states that he is on doxazosin and that he does not think he needs any further work-up for his prostate.  I explained to him that that medication may be helpful for BPH but does not prevent him from developing prostate cancer.  Patient became more belligerent.  Patient states that he does not want to see urology and refuses referral at this time.  He states that there is nothing wrong with his prostate and he wants no further work-up.  We will still place a referral to urology at this time and patient can refuse it if he does not want to go. ?

## 2021-09-20 NOTE — Assessment & Plan Note (Addendum)
Lab Results  ?Component Value Date  ? HGBA1C 6.9 (A) 09/20/2021  ? HGBA1C 7.3 (A) 06/14/2021  ? HGBA1C 7.0 (A) 02/15/2021  ?  ? ?Assessment: ?Diabetes control:   Well-controlled ?Progress toward A1C goal:   At goal ?Comments: Patient is compliant with Jardiance 25 mg daily.  He was wondering if he could come off this medication but explained to the patient that his A1c is at goal because he is on this medication at that dose.  He is willing to continue for now. ? ?Plan: ?Medications:  continue current medications ?Home glucose monitoring: ?Frequency:   ?Timing:   ?Instruction/counseling given: discussed the need for weight loss ?Educational resources provided:   ?Actor tools provided:   ?Other plans: We will check BMP today ? ?Addendum: ?-Patient's BMP was within normal limits except for mildly elevated blood glucose level. ?-No further work-up at this time ?-Case discussed with patient via phone and he expressed understanding and is in agreement with plan ? ? ?

## 2021-09-21 ENCOUNTER — Encounter (HOSPITAL_COMMUNITY): Payer: Self-pay | Admitting: *Deleted

## 2021-09-21 ENCOUNTER — Ambulatory Visit (HOSPITAL_BASED_OUTPATIENT_CLINIC_OR_DEPARTMENT_OTHER): Payer: Medicare Other | Admitting: *Deleted

## 2021-09-21 ENCOUNTER — Other Ambulatory Visit: Payer: Self-pay

## 2021-09-21 VITALS — BP 165/90 | HR 87 | Resp 20 | Ht 73.0 in | Wt 223.4 lb

## 2021-09-21 DIAGNOSIS — F319 Bipolar disorder, unspecified: Secondary | ICD-10-CM | POA: Diagnosis not present

## 2021-09-21 LAB — LIPID PANEL
Chol/HDL Ratio: 2.4 ratio (ref 0.0–5.0)
Cholesterol, Total: 186 mg/dL (ref 100–199)
HDL: 76 mg/dL (ref >39)
LDL Chol Calc (NIH): 101 mg/dL — ABNORMAL HIGH (ref 0–99)
Triglycerides: 46 mg/dL (ref 0–149)
VLDL Cholesterol Cal: 9 mg/dL (ref 5–40)

## 2021-09-21 LAB — CMP14 + ANION GAP
ALT: 13 IU/L (ref 0–44)
AST: 20 IU/L (ref 0–40)
Albumin/Globulin Ratio: 1.9 (ref 1.2–2.2)
Albumin: 4.5 g/dL (ref 3.8–4.8)
Alkaline Phosphatase: 75 IU/L (ref 44–121)
Anion Gap: 12 mmol/L (ref 10.0–18.0)
BUN/Creatinine Ratio: 9 — ABNORMAL LOW (ref 10–24)
BUN: 11 mg/dL (ref 8–27)
Bilirubin Total: 0.3 mg/dL (ref 0.0–1.2)
CO2: 25 mmol/L (ref 20–29)
Calcium: 9.3 mg/dL (ref 8.6–10.2)
Chloride: 102 mmol/L (ref 96–106)
Creatinine, Ser: 1.17 mg/dL (ref 0.76–1.27)
Globulin, Total: 2.4 g/dL (ref 1.5–4.5)
Glucose: 103 mg/dL — ABNORMAL HIGH (ref 70–99)
Potassium: 4.6 mmol/L (ref 3.5–5.2)
Sodium: 139 mmol/L (ref 134–144)
Total Protein: 6.9 g/dL (ref 6.0–8.5)
eGFR: 69 mL/min/{1.73_m2} (ref >59)

## 2021-09-21 LAB — PSA: Prostate Specific Ag, Serum: 14.6 ng/mL — ABNORMAL HIGH (ref 0.0–4.0)

## 2021-09-21 NOTE — Patient Instructions (Signed)
Pt in office today for due injection of Prolixin 25 mg. Pt anxious, c/o lack of sleep. Injection prepared and given in [place] without c/o. Pt appreciative and cooperative. Wayne Schmidt is to return in two weeks for next due injection.  ?

## 2021-09-21 NOTE — Addendum Note (Signed)
Addended by: Aldine Contes on: 09/21/2021 02:56 PM ? ? Modules accepted: Orders ? ?

## 2021-09-25 ENCOUNTER — Emergency Department (HOSPITAL_COMMUNITY)
Admission: EM | Admit: 2021-09-25 | Discharge: 2021-09-26 | Disposition: A | Discharge status: Home / Self Care | Payer: Medicare Other | Attending: Emergency Medicine | Admitting: Emergency Medicine

## 2021-09-25 ENCOUNTER — Encounter (HOSPITAL_COMMUNITY): Payer: Self-pay | Admitting: Emergency Medicine

## 2021-09-25 ENCOUNTER — Other Ambulatory Visit: Payer: Self-pay

## 2021-09-25 DIAGNOSIS — I10 Essential (primary) hypertension: Secondary | ICD-10-CM | POA: Insufficient documentation

## 2021-09-25 DIAGNOSIS — Z79899 Other long term (current) drug therapy: Secondary | ICD-10-CM | POA: Insufficient documentation

## 2021-09-25 DIAGNOSIS — Z743 Need for continuous supervision: Secondary | ICD-10-CM | POA: Diagnosis not present

## 2021-09-25 DIAGNOSIS — R319 Hematuria, unspecified: Secondary | ICD-10-CM | POA: Diagnosis not present

## 2021-09-25 DIAGNOSIS — R339 Retention of urine, unspecified: Secondary | ICD-10-CM | POA: Insufficient documentation

## 2021-09-25 DIAGNOSIS — Z7984 Long term (current) use of oral hypoglycemic drugs: Secondary | ICD-10-CM | POA: Diagnosis not present

## 2021-09-25 DIAGNOSIS — R6889 Other general symptoms and signs: Secondary | ICD-10-CM | POA: Diagnosis not present

## 2021-09-25 DIAGNOSIS — E119 Type 2 diabetes mellitus without complications: Secondary | ICD-10-CM | POA: Diagnosis not present

## 2021-09-25 DIAGNOSIS — Z5321 Procedure and treatment not carried out due to patient leaving prior to being seen by health care provider: Secondary | ICD-10-CM | POA: Diagnosis not present

## 2021-09-25 LAB — URINALYSIS, ROUTINE W REFLEX MICROSCOPIC
Bacteria, UA: NONE SEEN
Bilirubin Urine: NEGATIVE
Glucose, UA: >=500 mg/dL — AB
Ketones, ur: NEGATIVE mg/dL
Leukocytes,Ua: NEGATIVE
Nitrite: NEGATIVE
Protein, ur: NEGATIVE mg/dL
Specific Gravity, Urine: 1.01 (ref 1.005–1.030)
pH: 5 (ref 5.0–8.0)

## 2021-09-25 NOTE — ED Provider Notes (Signed)
?Wayne Schmidt ?Provider Note ? ? ?CSN: [id number] ?Arrival date & time: 09/25/21  2251 ? ?  ? ?History ? ?Chief Complaint  ?Patient presents with  ? Urinary Retention  ? ? ?Wayne Schmidt is a 66 y.o. male. ? ?The history is provided by the patient and medical records.  ? ?66 year old male with history of hypertension, schizophrenia, polysubstance abuse, diabetes, presenting to the ED complaint of urinary retention.  States he has not been able to urinate for the past hour.  States he feels like he needs a catheter.  He has required this in the past a few years ago.  Denies any fever or chills.  States he is very uncomfortable.  No meds taken PTA. ? ?Home Medications ?Prior to Admission medications   ?Medication Sig Start Date End Date Taking? Authorizing Provider  ?doxazosin (CARDURA) 2 MG tablet TAKE (1) TABLET BY MOUTH ONCE DAILY. 12/02/20   Aldine Contes, MD  ?fluPHENAZine decanoate (PROLIXIN) 25 MG/ML injection Inject 1 mL (25 mg total) into the muscle every 14 (fourteen) days. 08/12/21   Arfeen, Arlyce Harman, MD  ?hydroxypropyl methylcellulose / hypromellose (ISOPTO TEARS / GONIOVISC) 2.5 % ophthalmic solution Place 2 drops into both eyes daily as needed for dry eyes.    [provider]  ?JARDIANCE 25 MG TABS tablet TAKE (1) TABLET BY MOUTH ONCE DAILY BEFORE BREAKFAST. 02/10/21   Velna Ochs, MD  ?losartan (COZAAR) 100 MG tablet TAKE (1) TABLET BY MOUTH ONCE DAILY. 04/18/21   Aldine Contes, MD  ?rosuvastatin (CRESTOR) 20 MG tablet TAKE (1) TABLET BY MOUTH ONCE DAILY. 04/18/21   Aldine Contes, MD  ?VOLTAREN 1 % GEL Apply 2 g topically 4 (four) times daily. 06/04/19   Earlene Plater, MD  ?   ? ?Allergies    ?Ace inhibitors, Atorvastatin, Chlorpromazine hcl, and Simvastatin   ? ?Review of Systems   ?Review of Systems  ?Genitourinary:  Positive for difficulty urinating.  ?All other systems reviewed and are negative. ? ?Physical Exam ?Updated Vital Signs ?BP  (!) 145/75   Pulse (!) 106   Temp 99.5 ?F (37.5 ?C)   Resp 16   SpO2 100%  ? ?Physical Exam ?Vitals and nursing note reviewed.  ?Constitutional:   ?   Appearance: He is well-developed.  ?HENT:  ?   Head: Normocephalic and atraumatic.  ?Eyes:  ?   Conjunctiva/sclera: Conjunctivae normal.  ?   Pupils: Pupils are equal, round, and reactive to light.  ?Cardiovascular:  ?   Rate and Rhythm: Normal rate and regular rhythm.  ?   Heart sounds: Normal heart sounds.  ?Pulmonary:  ?   Effort: Pulmonary effort is normal. No respiratory distress.  ?   Breath sounds: Normal breath sounds. No rhonchi.  ?Abdominal:  ?   General: Bowel sounds are normal.  ?   Palpations: Abdomen is soft.  ?   Tenderness: There is no abdominal tenderness. There is no rebound.  ?Genitourinary: ?   Comments: Chaperoned by RN ?Uncircumcised, testicles non-tender, no masses ?Musculoskeletal:     ?   General: Normal range of motion.  ?   Cervical back: Normal range of motion.  ?Skin: ?   General: Skin is warm and dry.  ?Neurological:  ?   Mental Status: He is alert and oriented to person, place, and time.  ? ? ?ED Results / Procedures / Treatments   ?Labs ?(all labs ordered are listed, but only abnormal results are displayed) ?Labs Reviewed  ?URINALYSIS, ROUTINE W  REFLEX MICROSCOPIC - Abnormal; Notable for the following components:  ?    Result Value  ? Color, Urine STRAW (*)   ? Glucose, UA >=500 (*)   ? Hgb urine dipstick SMALL (*)   ? All other components within normal limits  ?URINE CULTURE  ? ? ?EKG ?None ? ?Radiology ?No results found. ? ?Procedures ?Procedures  ? ? ?Medications Ordered in ED ?Medications - No data to display ? ?ED Course/ Medical Decision Making/ A&P ?  ?                        ?Medical Decision Making ?Amount and/or Complexity of Data Reviewed ?Labs: ordered. ? ? ?66 year old male presenting to the ED with urinary retention.  Has history of same requiring Foley catheter in the past.  He is afebrile and nontoxic but does appear  uncomfortable and is quite tachycardic.  I suspect this is likely from pain.  He is not have any fever.  Foley catheter was inserted, drained out approximately 500cc yellow urine.  UA is without leuks or bacteria, small blood.  UA sent for culture.   ? ?After foley placed, HR has normalized and now is essentially back to baseline at rate of 102 on my re-evaluation.  He states he is feeling better, no pain currently.  Will transition to leg back and d/c home with foley in place.  He is established with [facility], will have him call in the morning for follow-up.  He will return here for any new/acute changes. ? ?Final Clinical Impression(s) / ED Diagnoses ?Final diagnoses:  ?Urinary retention  ? ? ?Rx / DC Orders ?ED Discharge Orders   ? ? None  ? ?  ? ? ?  ?Larene Pickett, PA-C ?09/26/21 0019 ? ?  ?Isla Pence, MD ?09/26/21 1642 ? ?

## 2021-09-25 NOTE — ED Triage Notes (Signed)
Pt BIB EMS from home with c/o urinary retention x 1 hour. Pt has hx of urinary retention ?

## 2021-09-26 ENCOUNTER — Ambulatory Visit: Payer: Self-pay | Admitting: *Deleted

## 2021-09-26 ENCOUNTER — Emergency Department (HOSPITAL_COMMUNITY)
Admission: EM | Admit: 2021-09-26 | Discharge: 2021-09-27 | Discharge status: Against Medical Advice | Payer: Medicare Other | Source: Home / Self Care

## 2021-09-26 DIAGNOSIS — Z5321 Procedure and treatment not carried out due to patient leaving prior to being seen by health care provider: Secondary | ICD-10-CM | POA: Insufficient documentation

## 2021-09-26 DIAGNOSIS — R319 Hematuria, unspecified: Secondary | ICD-10-CM | POA: Insufficient documentation

## 2021-09-26 DIAGNOSIS — R339 Retention of urine, unspecified: Secondary | ICD-10-CM | POA: Diagnosis not present

## 2021-09-26 LAB — URINALYSIS, ROUTINE W REFLEX MICROSCOPIC: RBC / HPF: >50 RBC/hpf — ABNORMAL HIGH (ref 0–5)

## 2021-09-26 LAB — CBC WITH DIFFERENTIAL/PLATELET
Abs Immature Granulocytes: 0.03 10*3/uL (ref 0.00–0.07)
Basophils Absolute: 0 10*3/uL (ref 0.0–0.1)
Basophils Relative: 0 %
Eosinophils Absolute: 0.1 10*3/uL (ref 0.0–0.5)
Eosinophils Relative: 1 %
HCT: 48.6 % (ref 39.0–52.0)
Hemoglobin: 15.4 g/dL (ref 13.0–17.0)
Immature Granulocytes: 0 %
Lymphocytes Relative: 33 %
Lymphs Abs: 3.7 10*3/uL (ref 0.7–4.0)
MCH: 24.3 pg — ABNORMAL LOW (ref 26.0–34.0)
MCHC: 31.7 g/dL (ref 30.0–36.0)
MCV: 76.7 fL — ABNORMAL LOW (ref 80.0–100.0)
Monocytes Absolute: 1.8 10*3/uL — ABNORMAL HIGH (ref 0.1–1.0)
Monocytes Relative: 16 %
Neutro Abs: 5.4 10*3/uL (ref 1.7–7.7)
Neutrophils Relative %: 50 %
Platelets: 138 10*3/uL — ABNORMAL LOW (ref 150–400)
RBC: 6.34 MIL/uL — ABNORMAL HIGH (ref 4.22–5.81)
RDW: 18 % — ABNORMAL HIGH (ref 11.5–15.5)
WBC: 11.1 10*3/uL — ABNORMAL HIGH (ref 4.0–10.5)
nRBC: 0 % (ref 0.0–0.2)

## 2021-09-26 LAB — COMPREHENSIVE METABOLIC PANEL
ALT: 17 U/L (ref 0–44)
AST: 26 U/L (ref 15–41)
Albumin: 3.9 g/dL (ref 3.5–5.0)
Alkaline Phosphatase: 54 U/L (ref 38–126)
Anion gap: 8 (ref 5–15)
BUN: 20 mg/dL (ref 8–23)
CO2: 25 mmol/L (ref 22–32)
Calcium: 9.3 mg/dL (ref 8.9–10.3)
Chloride: 106 mmol/L (ref 98–111)
Creatinine, Ser: 1.38 mg/dL — ABNORMAL HIGH (ref 0.61–1.24)
GFR, Estimated: 56 mL/min — ABNORMAL LOW (ref >60)
Glucose, Bld: 107 mg/dL — ABNORMAL HIGH (ref 70–99)
Potassium: 4.3 mmol/L (ref 3.5–5.1)
Sodium: 139 mmol/L (ref 135–145)
Total Bilirubin: 0.6 mg/dL (ref 0.3–1.2)
Total Protein: 7.4 g/dL (ref 6.5–8.1)

## 2021-09-26 NOTE — Telephone Encounter (Signed)
?  Chief Complaint: Pulled foley out ?Symptoms: Pulled foley out with balloon attached, bleeding. ?Frequency: Now ?Pertinent Negatives: Patient denies  ?Disposition: '[x]'$ ED /'[]'$ Urgent Care (no appt availability in office) / '[]'$ Appointment(In office/virtual)/ '[]'$  [facility]/ '[]'$ Home Care/ '[]'$ Refused Recommended Disposition /'[]'$ [facility]/ '[]'$  Follow-up with PCP ?Additional Notes: Per ED note of yesterday pt needs foley for retention. Advised to return for placement. Will ask for leg bag as foley bag was issue for him. "I can't work like that." ?Reason for Disposition ? Nursing judgment or information in reference ? ?Answer Assessment - Initial Assessment Questions ?1. REASON FOR CALL: "What is your main concern right now?" ?    Pulled foley out, needs for retention, bleeding ? ?Protocols used: No Guideline Available-A-AH ? ?

## 2021-09-26 NOTE — ED Provider Triage Note (Signed)
Emergency Medicine Provider Triage Evaluation Note ? ?Wayne Schmidt , a 66 y.o. male  was evaluated in triage.  Pt complains of hematuria.  Patient reports that he was seen in the emergency department yesterday for urinary retention.  A Foley catheter was placed.  Today at approximately 2 PM he removed the Foley catheter due to discomfort.  Patient states that he has been able to urinate without difficulty since then.  Patient has noticed gross hematuria since removing his Foley catheter.  Patient denies any blood thinner use. ? ?Review of Systems  ?Positive: Hematuria ?Negative: Urinary retention, dysuria, urinary frequency, difficulty urinating, genital swelling or tenderness ? ?Physical Exam  ?BP 131/82 (BP Location: Left Arm)   Pulse 77   Temp 98.4 ?F (36.9 ?C) (Oral)   Resp 18   SpO2 95%  ?Gen:   Awake, no distress   ?Resp:  Normal effort  ?MSK:   Moves extremities without difficulty  ?Other:  Abdomen soft, nondistended, nontender ? ?Medical Decision Making  ?Medically screening exam initiated at 8:46 PM.  Appropriate orders placed.  Wayne Schmidt was informed that the remainder of the evaluation will be completed by another provider, this initial triage assessment does not replace that evaluation, and the importance of remaining in the ED until their evaluation is complete. ? ? ?  ?Loni Beckwith, PA-C ?09/26/21 2048 ? ?

## 2021-09-26 NOTE — ED Triage Notes (Signed)
Pt was here yesterday and had a foley placed on him for urinary retention, pt removed him self due to by uncomfortable, pt able to urinate with no problem today concern about bloody secretions. ?

## 2021-09-26 NOTE — Discharge Instructions (Signed)
Call urology office in the morning to arrange follow-up to have catheter removed. ?Return here for new concerns. ?

## 2021-09-27 LAB — URINE CULTURE: Culture: <10000 — AB

## 2021-09-27 NOTE — ED Notes (Signed)
Pt called 3x for room w/o resopnse  ?

## 2021-09-28 LAB — URINE CULTURE: Culture: <10000 — AB

## 2021-09-29 ENCOUNTER — Telehealth: Payer: Self-pay | Admitting: Internal Medicine

## 2021-09-29 NOTE — Telephone Encounter (Signed)
RTC to patient stated that he feels a little feverish at times.  Did get a Tetanus vaccine recently.  Patient also went to the ER a couple days ago for urinary blockage as well.  Also said he is taking Crestor.  Feels a little dizzy at times when he gets around in the mornings.  Feels better after eating. Encouraged patient to eat something since he is taking the Jardiance for his Diabetes.  Informed patient that I will send a message to Dr. Dareen Piano about hid symptoms. ?

## 2021-09-29 NOTE — Telephone Encounter (Signed)
Pt requesting a call back. Pt states he is starting to feel the same way again today when taking his medication.  Pt feeling dizzy and feels "feverish" .   rosuvastatin (CRESTOR) 20 MG tablet.. ? ? ?

## 2021-09-29 NOTE — Telephone Encounter (Signed)
Attempted to relay info below from PCP to patient. Line rang ~20 times. No answer and no VM set up. PCP wants to insure patient is aware Crestor, nor recent vaccine, is cause of fever.Per PCP, patient needs to go back to ED as there are no openings in [facility] Also, needs to follow with Urology for elevated PSA. ? ?

## 2021-09-30 NOTE — Telephone Encounter (Signed)
Second attempt to relay info below. No answer at first number. Second number goes straight to VM. Left message on VM requesting return call.  ? ?

## 2021-10-03 NOTE — Telephone Encounter (Signed)
Can we try calling him again this week to follow up on this? Thank you ?

## 2021-10-05 ENCOUNTER — Ambulatory Visit (HOSPITAL_BASED_OUTPATIENT_CLINIC_OR_DEPARTMENT_OTHER): Payer: Medicare Other

## 2021-10-05 ENCOUNTER — Encounter (HOSPITAL_COMMUNITY): Payer: Self-pay

## 2021-10-05 DIAGNOSIS — F200 Paranoid schizophrenia: Secondary | ICD-10-CM

## 2021-10-05 DIAGNOSIS — R338 Other retention of urine: Secondary | ICD-10-CM | POA: Diagnosis not present

## 2021-10-05 NOTE — Patient Instructions (Addendum)
Patient presented today for his due Fluphenazine Decanoate '125mg'$  per 37m injection. Patient was in his usual manner, pleasant and cooperative upon approach. Pt was having trouble with his car today so he had to walk to [facility] where his transportation service  dropped him off to get picked up again after his injection. Medication was prepared and given as ordered in pt's [place] [facility] to return in 2 weeks for next due injection ?

## 2021-10-19 ENCOUNTER — Encounter (HOSPITAL_COMMUNITY): Payer: Self-pay

## 2021-10-19 ENCOUNTER — Ambulatory Visit (HOSPITAL_BASED_OUTPATIENT_CLINIC_OR_DEPARTMENT_OTHER): Payer: Medicare Other

## 2021-10-19 DIAGNOSIS — F200 Paranoid schizophrenia: Secondary | ICD-10-CM

## 2021-10-19 NOTE — Patient Instructions (Addendum)
Patient presented today for due Fluphenazine Decanoate '125mg'$  per 31m injection. Patient was pleasant and cooperative upon approach, which is his normal demeaner. Medication was prepared as ordered and given in pt's RIGHT UPPER OUTER QUADRANT. Pt to return in 2 weeks for next due injection. ?

## 2021-11-02 ENCOUNTER — Ambulatory Visit (HOSPITAL_BASED_OUTPATIENT_CLINIC_OR_DEPARTMENT_OTHER): Payer: Medicare Other

## 2021-11-02 ENCOUNTER — Encounter (HOSPITAL_COMMUNITY): Payer: Self-pay

## 2021-11-02 DIAGNOSIS — F200 Paranoid schizophrenia: Secondary | ICD-10-CM | POA: Diagnosis not present

## 2021-11-02 NOTE — Patient Instructions (Addendum)
Patient presented today for his due Fluphenazine Decanoate '125mg'$  per 47m injection. Patient presented with his usual demeanor, very pleasant and cooperative. Medication was prepared as ordered and given in pt's LEFT UPPER OUTER QUADRANT. Patient to return in 2 weeks for his next due injection. ?

## 2021-11-11 ENCOUNTER — Encounter (HOSPITAL_COMMUNITY): Payer: Self-pay | Admitting: Psychiatry

## 2021-11-11 ENCOUNTER — Telehealth (HOSPITAL_BASED_OUTPATIENT_CLINIC_OR_DEPARTMENT_OTHER): Payer: Medicare Other | Admitting: Psychiatry

## 2021-11-11 DIAGNOSIS — F200 Paranoid schizophrenia: Secondary | ICD-10-CM

## 2021-11-11 DIAGNOSIS — G2401 Drug induced subacute dyskinesia: Secondary | ICD-10-CM | POA: Diagnosis not present

## 2021-11-11 MED ORDER — FLUPHENAZINE DECANOATE 25 MG/ML IJ SOLN: 25.0000 mg | INTRAMUSCULAR | 2 refills | Status: DC

## 2021-11-11 NOTE — Progress Notes (Signed)
Virtual Visit via Telephone Note ? ?I connected with Wayne Schmidt on 11/11/21 at 10:20 AM EDT by telephone and verified that I am speaking with the correct person using two identifiers. ? ?Location: ?Patient: Home ?Provider: Home Office ?  ?I discussed the limitations, risks, security and privacy concerns of performing an evaluation and management service by telephone and the availability of in person appointments. I also discussed with the patient that there may be a patient responsible charge related to this service. The patient expressed understanding and agreed to proceed. ? ? ?History of Present Illness: ?Patient is evaluated by phone session.  He was in the emergency room in March for urinary retention.  He had blood work.  His hemoglobin A1c is 6.9 and he has high PSA.  Patient told he went to urology and he has an appointment coming up again on 18th.  He sleeps good.  He lost weight.  He does go outside sometime and try to visit his family in [place].  He is religiously preoccupied and sometime talked to himself but denies any suicidal thoughts, hallucination or any paranoia.  He denies any anger.  On the phone he remained calm and pleasant but his thought process remains tangential.  He has tremors but does not want to take any medications since he had tried in the past which did not help him.  He reported his appetite is okay.  His energy level is fair.  He denies drinking or using any illegal substances. ? ?Past Psychiatric History: Reviewed. ?H/O schizophrenia with multiple inpatient treatment. H/O of hallucination, paranoia and suicidal attempt by overdose.  Last inpatient at [facility]. Took Haldol and Thorazine but had side effects.  On Prolixin injection for many years.  Tried hydroxyzine and Ingreeza but could not tolerate. ? ?  ?Recent Results (from the past 2160 hour(s))  ?Glucose, capillary     Status: Abnormal  ? Collection Time: 09/20/21 11:08 AM  ?Result Value Ref Range  ? Glucose-Capillary 118  (H) 70 - 99 mg/dL  ?  Comment: Glucose reference range applies only to samples taken after fasting for at least 8 hours.  ?POC Hbg A1C     Status: Abnormal  ? Collection Time: 09/20/21 11:16 AM  ?Result Value Ref Range  ? Hemoglobin A1C 6.9 (A) 4.0 - 5.6 %  ? HbA1c POC (<> result, manual entry)    ? HbA1c, POC (prediabetic range)    ? HbA1c, POC (controlled diabetic range)    ?Lipid Profile     Status: Abnormal  ? Collection Time: 09/20/21 12:18 PM  ?Result Value Ref Range  ? Cholesterol, Total 186 100 - 199 mg/dL  ? Triglycerides 46 0 - 149 mg/dL  ? HDL 76 >39 mg/dL  ? VLDL Cholesterol Cal 9 5 - 40 mg/dL  ? LDL Chol Calc (NIH) 101 (H) 0 - 99 mg/dL  ? Chol/HDL Ratio 2.4 0.0 - 5.0 ratio  ?  Comment:                                   T. Chol/HDL Ratio ?                                            Men  Women ?  1/2 Avg.Risk  3.4    3.3 ?                                  Avg.Risk  5.0    4.4 ?                               2X Avg.Risk  9.6    7.1 ?                               3X Avg.Risk 23.4   11.0 ?  ?CMP14 + Anion Gap     Status: Abnormal  ? Collection Time: 09/20/21 12:18 PM  ?Result Value Ref Range  ? Glucose 103 (H) 70 - 99 mg/dL  ? BUN 11 8 - 27 mg/dL  ? Creatinine, Ser 1.17 0.76 - 1.27 mg/dL  ? eGFR 69 >59 mL/min/1.73  ? BUN/Creatinine Ratio 9 (L) 10 - 24  ? Sodium 139 134 - 144 mmol/L  ? Potassium 4.6 3.5 - 5.2 mmol/L  ? Chloride 102 96 - 106 mmol/L  ? CO2 25 20 - 29 mmol/L  ? Anion Gap 12.0 10.0 - 18.0 mmol/L  ? Calcium 9.3 8.6 - 10.2 mg/dL  ? Total Protein 6.9 6.0 - 8.5 g/dL  ? Albumin 4.5 3.8 - 4.8 g/dL  ? Globulin, Total 2.4 1.5 - 4.5 g/dL  ? Albumin/Globulin Ratio 1.9 1.2 - 2.2  ? Bilirubin Total 0.3 0.0 - 1.2 mg/dL  ? Alkaline Phosphatase 75 44 - 121 IU/L  ? AST 20 0 - 40 IU/L  ? ALT 13 0 - 44 IU/L  ?PSA     Status: Abnormal  ? Collection Time: 09/20/21 12:18 PM  ?Result Value Ref Range  ? Prostate Specific Ag, Serum 14.6 (H) 0.0 - 4.0 ng/mL  ?  Comment: Roche ECLIA  methodology. ?According to the American Urological Association, Serum PSA should ?decrease and remain at undetectable levels after radical ?prostatectomy. The AUA defines biochemical recurrence as an initial ?PSA value 0.2 ng/mL or greater followed by a subsequent confirmatory ?PSA value 0.2 ng/mL or greater. ?Values obtained with different assay methods or kits cannot be used ?interchangeably. Results cannot be interpreted as absolute evidence ?of the presence or absence of malignant disease. ?  ?Urinalysis, Routine w reflex microscopic     Status: Abnormal  ? Collection Time: 09/25/21 11:11 PM  ?Result Value Ref Range  ? Color, Urine STRAW (A) YELLOW  ? APPearance CLEAR CLEAR  ? Specific Gravity, Urine 1.010 1.005 - 1.030  ? pH 5.0 5.0 - 8.0  ? Glucose, UA >=500 (A) NEGATIVE mg/dL  ? Hgb urine dipstick SMALL (A) NEGATIVE  ? Bilirubin Urine NEGATIVE NEGATIVE  ? Ketones, ur NEGATIVE NEGATIVE mg/dL  ? Protein, ur NEGATIVE NEGATIVE mg/dL  ? Nitrite NEGATIVE NEGATIVE  ? Leukocytes,Ua NEGATIVE NEGATIVE  ? RBC / HPF 0-5 0 - 5 RBC/hpf  ? WBC, UA 0-5 0 - 5 WBC/hpf  ? Bacteria, UA NONE SEEN NONE SEEN  ? Mucus PRESENT   ?  Comment: Performed at [facility], [place] [address], [place], [place] [address]  ?Urine Culture     Status: Abnormal  ? Collection Time: 09/25/21 11:11 PM  ? Specimen: Urine, Catheterized  ?Result Value Ref Range  ? Specimen Description URINE, CATHETERIZED   ?  Special Requests NONE   ? Culture (A)   ?  <10,000 COLONIES/mL INSIGNIFICANT GROWTH ?Performed at [facility], [place] [address], [place], [place] [address] ?  ? Report Status 09/27/2021 FINAL   ?Urinalysis, Routine w reflex microscopic Urine, Clean Catch     Status: Abnormal  ? Collection Time: 09/26/21  8:47 PM  ?Result Value Ref Range  ? Color, Urine RED (A) YELLOW  ?  Comment: BIOCHEMICALS MAY BE AFFECTED BY COLOR  ? APPearance TURBID (A) CLEAR  ? Specific Gravity, Urine  1.005 - 1.030  ?  TEST NOT REPORTED DUE TO COLOR INTERFERENCE OF  URINE PIGMENT  ? pH  5.0 - 8.0  ?  TEST NOT REPORTED DUE TO COLOR INTERFERENCE OF URINE PIGMENT  ? Glucose, UA (A) NEGATIVE mg/dL  ?  TEST NOT REPORTED DUE TO COLOR INTERFERENCE OF URINE PIGMENT  ? Hgb urine dipstick (A) NEGATIVE  ?  TEST NOT REPORTED DUE TO COLOR INTERFERENCE OF URINE PIGMENT  ? Bilirubin Urine (A) NEGATIVE  ?  TEST NOT REPORTED DUE TO COLOR INTERFERENCE OF URINE PIGMENT  ? Ketones, ur (A) NEGATIVE mg/dL  ?  TEST NOT REPORTED DUE TO COLOR INTERFERENCE OF URINE PIGMENT  ? Protein, ur (A) NEGATIVE mg/dL  ?  TEST NOT REPORTED DUE TO COLOR INTERFERENCE OF URINE PIGMENT  ? Nitrite (A) NEGATIVE  ?  TEST NOT REPORTED DUE TO COLOR INTERFERENCE OF URINE PIGMENT  ? Leukocytes,Ua (A) NEGATIVE  ?  TEST NOT REPORTED DUE TO COLOR INTERFERENCE OF URINE PIGMENT  ? RBC / HPF >50 (H) 0 - 5 RBC/hpf  ? WBC, UA 6-10 0 - 5 WBC/hpf  ? Bacteria, UA MANY (A) NONE SEEN  ?  Comment: Performed at [facility], [place] [address], [place], [place] [address]  ?Urine Culture     Status: Abnormal  ? Collection Time: 09/26/21  8:47 PM  ? Specimen: Urine, Clean Catch  ?Result Value Ref Range  ? Specimen Description URINE, CLEAN CATCH   ? Special Requests NONE   ? Culture (A)   ?  <10,000 COLONIES/mL INSIGNIFICANT GROWTH ?Performed at [facility], [place] [address], [place], [place] [address] ?  ? Report Status 09/28/2021 FINAL   ?Comprehensive metabolic panel     Status: Abnormal  ? Collection Time: 09/26/21  9:16 PM  ?Result Value Ref Range  ? Sodium 139 135 - 145 mmol/L  ? Potassium 4.3 3.5 - 5.1 mmol/L  ? Chloride 106 98 - 111 mmol/L  ? CO2 25 22 - 32 mmol/L  ? Glucose, Bld 107 (H) 70 - 99 mg/dL  ?  Comment: Glucose reference range applies only to samples taken after fasting for at least 8 hours.  ? BUN 20 8 - 23 mg/dL  ? Creatinine, Ser 1.38 (H) 0.61 - 1.24 mg/dL  ? Calcium 9.3 8.9 - 10.3 mg/dL  ? Total Protein 7.4 6.5 - 8.1 g/dL  ? Albumin 3.9 3.5 - 5.0 g/dL  ? AST 26 15 - 41 U/L  ? ALT 17 0 - 44 U/L  ? Alkaline  Phosphatase 54 38 - 126 U/L  ? Total Bilirubin 0.6 0.3 - 1.2 mg/dL  ? GFR, Estimated 56 (L) >60 mL/min  ?  Comment: (NOTE) ?Calculated using the CKD-EPI Creatinine Equation (2021) ?  ? Anion gap 8 5 - 15  ?  Comment: [facility]

## 2021-11-16 ENCOUNTER — Encounter (HOSPITAL_COMMUNITY): Payer: Self-pay | Admitting: *Deleted

## 2021-11-16 ENCOUNTER — Ambulatory Visit (HOSPITAL_BASED_OUTPATIENT_CLINIC_OR_DEPARTMENT_OTHER): Payer: Medicare Other | Admitting: *Deleted

## 2021-11-16 VITALS — BP 110/67 | HR 91 | Resp 18 | Ht 73.0 in | Wt 216.6 lb

## 2021-11-16 DIAGNOSIS — F200 Paranoid schizophrenia: Secondary | ICD-10-CM | POA: Diagnosis not present

## 2021-11-16 NOTE — Patient Instructions (Signed)
Pt presents today for due bi-weekly Prolixin 25 mg injection. Pt is pleasant and cooperative on approach. Affect appropriate to mood. Injection prepared as ordered and given in RUOQ without complaint. Pt to return in two weeks for next due injection. Pt advised to obtain refill for next injection. Pt agrees. ?

## 2021-11-17 DIAGNOSIS — R338 Other retention of urine: Secondary | ICD-10-CM | POA: Diagnosis not present

## 2021-11-22 DIAGNOSIS — Z23 Encounter for immunization: Secondary | ICD-10-CM | POA: Diagnosis not present

## 2021-11-30 ENCOUNTER — Encounter (HOSPITAL_COMMUNITY): Payer: Self-pay | Admitting: *Deleted

## 2021-11-30 ENCOUNTER — Ambulatory Visit (HOSPITAL_BASED_OUTPATIENT_CLINIC_OR_DEPARTMENT_OTHER): Payer: Medicare Other | Admitting: *Deleted

## 2021-11-30 VITALS — BP 132/77 | HR 95 | Resp 20 | Ht 73.0 in | Wt 216.6 lb

## 2021-11-30 DIAGNOSIS — F200 Paranoid schizophrenia: Secondary | ICD-10-CM

## 2021-11-30 NOTE — Patient Instructions (Signed)
Pt presents to clinic today for due Prolixin 25 mg bi-weekly injection. VSS. Pt pleasant and cooperative per usual presentation. Injection prepared and given as ordered in [facility] without complaint. Pt to return in two weeks for next due injection. Pt will pick up refill of medication prior to next visit.

## 2021-12-06 ENCOUNTER — Other Ambulatory Visit: Payer: Self-pay | Admitting: Internal Medicine

## 2021-12-06 DIAGNOSIS — I10 Essential (primary) hypertension: Secondary | ICD-10-CM

## 2021-12-06 NOTE — Telephone Encounter (Signed)
Next appt scheduled 01/10/22 with PCP.

## 2021-12-14 ENCOUNTER — Ambulatory Visit (HOSPITAL_COMMUNITY): Payer: Medicare Other

## 2021-12-14 ENCOUNTER — Telehealth (HOSPITAL_COMMUNITY): Payer: Self-pay | Admitting: *Deleted

## 2021-12-14 NOTE — Telephone Encounter (Signed)
Pt in office today for due Prolixin injection however pt had not picked up refill, so unable to give injection today. We discussed this on pt last visit and writer informed that he had 2 refills left. Pt does have an appointment across the street tomorrow so will come back then for injection. This nurse called [facility] and asked that they refill and deliver to pt. They agreed.

## 2021-12-14 NOTE — Telephone Encounter (Signed)
Thanks for update

## 2021-12-15 ENCOUNTER — Encounter (HOSPITAL_COMMUNITY): Payer: Self-pay

## 2021-12-15 ENCOUNTER — Ambulatory Visit (HOSPITAL_COMMUNITY): Payer: Medicare Other

## 2021-12-15 ENCOUNTER — Telehealth (HOSPITAL_COMMUNITY): Payer: Self-pay

## 2021-12-15 NOTE — Telephone Encounter (Signed)
Medication management - Telephone call with pt to follow up on his need for his due Prolixin Decanoate injection. Informed pt that [facility] should be delivering this medication to him on tomorrow morning.  Informed patient he would need to bring this medication into the [facility] office to obtain his now past due injections as the office does not have this dosage there for him.  Patient stated understanding and agreed if the medication is delivered on 12/16/21 before noon, as he would have to be at the [facility] outpatient office by the time they close at noon, he would come in on tomorrow for the due injection.  Patient agreed if after 12 noon he would come into the office on Monday 12/19/21 for the past due injection.  Explained to patient it was not the nursing staff's issue for not having the medication available but that his pharmacy had not delivered it to him to bring in and patient stated understanding.  Patient to come in on tomorrow 12/16/21 if medication is delivered in the AM by noon for injection.

## 2021-12-20 ENCOUNTER — Telehealth (HOSPITAL_COMMUNITY): Payer: Self-pay

## 2021-12-20 NOTE — Telephone Encounter (Signed)
Medication management - Telephone call with pt to discuss his status with past due Prolixin Dec injection.  Informed patient [facility] should be delivering his medication by noon today and arranged for pt to then bring it in this evening to obtain past due injection.  Patient discussed the fact his pharmacy had to order the medication and it would be in this morning so he agreed to come into the [facility] outpatient office once delivered. Patient to call back if any issues and if medication is not delivered this date.

## 2021-12-21 ENCOUNTER — Ambulatory Visit (HOSPITAL_BASED_OUTPATIENT_CLINIC_OR_DEPARTMENT_OTHER): Payer: Medicare Other | Admitting: *Deleted

## 2021-12-21 ENCOUNTER — Encounter (HOSPITAL_COMMUNITY): Payer: Self-pay | Admitting: *Deleted

## 2021-12-21 VITALS — BP 133/74 | HR 86 | Resp 20 | Ht 75.0 in | Wt 215.4 lb

## 2021-12-21 DIAGNOSIS — F319 Bipolar disorder, unspecified: Secondary | ICD-10-CM | POA: Diagnosis not present

## 2021-12-21 NOTE — Patient Instructions (Signed)
Pt in office today for due Prolixin 25 mg injection. Pt is overdue due to medication being out of stock at pt pharmacy. Pt pleasant and cooperative today. Conversation is regarding hyper religiosity. Pt does not appear more paranoid today however all conversation center on tangential religiousness. Injection prepared as ordered and given in RUOQ without any complaints. Pt tolerated injection well. Per Dr. Adele Schilder pt will return in 10 days (6/10/230) for next due injection, then will return to every 14 days per usual. Pt agrees.

## 2021-12-28 ENCOUNTER — Telehealth (HOSPITAL_COMMUNITY): Payer: Medicare Other

## 2021-12-30 ENCOUNTER — Ambulatory Visit (HOSPITAL_COMMUNITY): Payer: Medicare Other

## 2022-01-04 ENCOUNTER — Ambulatory Visit (HOSPITAL_BASED_OUTPATIENT_CLINIC_OR_DEPARTMENT_OTHER): Payer: Medicare Other | Admitting: *Deleted

## 2022-01-04 VITALS — BP 150/84 | HR 83 | Resp 18 | Ht 73.0 in | Wt 216.6 lb

## 2022-01-04 DIAGNOSIS — F200 Paranoid schizophrenia: Secondary | ICD-10-CM

## 2022-01-05 ENCOUNTER — Encounter (HOSPITAL_COMMUNITY): Payer: Self-pay | Admitting: *Deleted

## 2022-01-05 DIAGNOSIS — F200 Paranoid schizophrenia: Secondary | ICD-10-CM

## 2022-01-05 NOTE — Patient Instructions (Signed)
Pt presents for due Prolixin 25 mg injection. Pt cooperative on approach. As per usual presentation pt religiously preoccupied throughout visit. Injection prepared as ordered and given in LUOQ without complaint. Pt to return in 2 weeks for next due injection.

## 2022-01-10 ENCOUNTER — Encounter: Payer: Self-pay | Admitting: Internal Medicine

## 2022-01-10 ENCOUNTER — Ambulatory Visit (INDEPENDENT_AMBULATORY_CARE_PROVIDER_SITE_OTHER): Payer: Medicare Other | Admitting: Internal Medicine

## 2022-01-10 VITALS — BP 153/81 | HR 64 | Temp 97.5°F | Ht 73.0 in | Wt 218.1 lb

## 2022-01-10 DIAGNOSIS — Z0000 Encounter for general adult medical examination without abnormal findings: Secondary | ICD-10-CM

## 2022-01-10 DIAGNOSIS — I10 Essential (primary) hypertension: Secondary | ICD-10-CM | POA: Diagnosis not present

## 2022-01-10 DIAGNOSIS — Z7984 Long term (current) use of oral hypoglycemic drugs: Secondary | ICD-10-CM | POA: Diagnosis not present

## 2022-01-10 DIAGNOSIS — I700 Atherosclerosis of aorta: Secondary | ICD-10-CM

## 2022-01-10 DIAGNOSIS — F17200 Nicotine dependence, unspecified, uncomplicated: Secondary | ICD-10-CM

## 2022-01-10 DIAGNOSIS — E782 Mixed hyperlipidemia: Secondary | ICD-10-CM

## 2022-01-10 DIAGNOSIS — E1169 Type 2 diabetes mellitus with other specified complication: Secondary | ICD-10-CM | POA: Diagnosis not present

## 2022-01-10 DIAGNOSIS — F200 Paranoid schizophrenia: Secondary | ICD-10-CM

## 2022-01-10 DIAGNOSIS — N401 Enlarged prostate with lower urinary tract symptoms: Secondary | ICD-10-CM

## 2022-01-10 DIAGNOSIS — F17210 Nicotine dependence, cigarettes, uncomplicated: Secondary | ICD-10-CM

## 2022-01-10 DIAGNOSIS — R972 Elevated prostate specific antigen [PSA]: Secondary | ICD-10-CM

## 2022-01-10 DIAGNOSIS — R079 Chest pain, unspecified: Secondary | ICD-10-CM | POA: Diagnosis not present

## 2022-01-10 DIAGNOSIS — R351 Nocturia: Secondary | ICD-10-CM

## 2022-01-10 LAB — GLUCOSE, CAPILLARY: Glucose-Capillary: 115 mg/dL — ABNORMAL HIGH (ref 70–99)

## 2022-01-10 NOTE — Assessment & Plan Note (Signed)
Patient has chronic nocturia managed with Cardura. Patient reports that he is still waking up 4-5 times a night and that it is negatively affecting his quality of sleep. He has been following with [facility] since his last visit. He has had repeated elevated PSA levels, including post-treatment with Ciprofloxacin, and patient is scheduled for a prostate biopsy next month. Will follow up next visit after his biopsy.

## 2022-01-10 NOTE — Assessment & Plan Note (Addendum)
Patient has chronic stable hyperlipidemia managed with rosuvastatin '20mg'$  daily. He reports adherence but occasionally skips doses when he feels that rosuvastatin is contributing to dizziness and fatigue, which he estimates to be twice a month. We discussed that these are not side effects we attribute to statins and emphasized the importance of taking his medications as prescribed. Patient expressed understanding. No further work-up at this visit.

## 2022-01-10 NOTE — Assessment & Plan Note (Addendum)
Patient has chronic poorly-controlled hypertension managed with Losartan '100mg'$  and doxazosin '2mg'$  and patient reports good adherence to regimen. BP in office today was 153/81. Patient does not take blood pressure readings at home. Patient has been very resistant to any medication changes for other problems during this visit. Given elevated BP in office today would still recommend adding amlodipine 2.'5mg'$  but will plan to defer this discussion to the next visit.

## 2022-01-10 NOTE — Assessment & Plan Note (Addendum)
Patient has well-controlled diabetes managed with Jardiance '25mg'$  daily and reports good adherence. His non-fasting glucose in office today was 115. Plan is to check A1C, BMP, and urine microalbumin/cr today. His A1C was collected today but the sample was inadequate for processing, so will reach out for him to return to lab this week for a repeat.   Addendum: BMP and Urine Microalbumin:Creatinine ratio were WNL except for a mildly elevated blood glucose. Patient will need to make a lab appointment to check his A1C as there was an error with the machine during his last office visit. He will also get a CBC because the error read as low hemoglobin. I spoke with the front desk and they will call to setup a lab appointment for him. Patient is in agreement with plan

## 2022-01-10 NOTE — Patient Instructions (Addendum)
It is important for you to take your medications as prescribed. If you experience any intolerable side effects, please call our office to schedule an appointment.   Diabetes Mellitus Continue to take Jardiance '25mg'$  as prescribed.   High blood pressure Your blood pressure is elevated today (153/81) and if it remains elevated we would recommend adjusting your medications at a future visit. Continue to take Cozaar '100mg'$  and Cardura '2mg'$  as prescribed.   Coronary Artery Disease Continue taking rosuvastatin '20mg'$  as prescribed.   Urology Continue to follow up with your Urologist for your biopsy.

## 2022-01-10 NOTE — Progress Notes (Unsigned)
Subjective:   Patient ID: Wayne Schmidt male   DOB: [birth year] 66 y.o.   MRN: [id number]  HPI: Wayne Schmidt is a 66 y.o. with a history significant for paranoid schizophrenia taking fluphenazine, chronic hypertension, T2DM, CAD, and BPH who presents for follow up of chronic conditions and an acute complaint of nocturia.   Patient reports that he is still waking 4-5 times a night to urinate and that it is affecting his quality of sleep. He is currently taking Cardura and has previously taken Flomax for BPH but he does not feel any improvement. He denies dysuria. He started following with [facility] for BPH, urinary retention, and elevated PSA two months ago and they started him on a one month regimen of ciprofloxacin. Per chart review they found repeated elevated PSA (14.4, 15, 14.6) over two months despite the ciprofloxacin and they are proceeding with a prostate biopsy next month.   Patient reports generally feeling well and endorses that he is taking his medications as prescribed. He reports that he feels the rosuvastatin is giving him "toxic" side effects such as dizziness and fatigue. He denies unintentional weight loss, fevers, nausea, vomiting, or bowel movement changes.   Patient Active Problem List   Diagnosis Date Noted   Elevated PSA 01/10/2022   Right shoulder pain 11/11/2019   Plantar fasciitis 06/04/2019   Healthcare maintenance 04/01/2014   Lumbar radiculopathy 08/04/2013   ONYCHOMYCOSIS, TOENAILS 07/07/2010   MICROCYTOSIS 02/24/2010   BPH associated with nocturia 03/24/2009   Chest pain 11/22/2006   Paranoid schizophrenia, subchronic condition ([place]) 10/11/2006   Hyperlipidemia 08/28/2006   Type 2 diabetes mellitus with other specified complication ([place]) 76/28/3151   TOBACCO ABUSE 04/27/2006   Essential hypertension 04/27/2006     Current Outpatient Medications  Medication Sig Dispense Refill   doxazosin (CARDURA) 2 MG tablet TAKE (1) TABLET BY MOUTH  ONCE DAILY. 30 tablet 5   fluPHENAZine decanoate (PROLIXIN) 25 MG/ML injection Inject 1 mL (25 mg total) into the muscle every 14 (fourteen) days. 5 mL 2   hydroxypropyl methylcellulose / hypromellose (ISOPTO TEARS / GONIOVISC) 2.5 % ophthalmic solution Place 2 drops into both eyes daily as needed for dry eyes.     JARDIANCE 25 MG TABS tablet TAKE (1) TABLET BY MOUTH ONCE DAILY BEFORE BREAKFAST. 30 tablet 11   losartan (COZAAR) 100 MG tablet TAKE (1) TABLET BY MOUTH ONCE DAILY. 30 tablet 11   rosuvastatin (CRESTOR) 20 MG tablet TAKE (1) TABLET BY MOUTH ONCE DAILY. 30 tablet 11   VOLTAREN 1 % GEL Apply 2 g topically 4 (four) times daily. 20 g 10   Current Facility-Administered Medications  Medication Dose Route Frequency Provider Last Rate Last Admin   fluPHENAZine decanoate (PROLIXIN) injection 25 mg  25 mg Intramuscular Q14 Days Arfeen, Arlyce Harman, MD   25 mg at 01/05/22 7616     Review of Systems: Pertinent items are noted in HPI.  Objective:   Physical Exam: Vitals:   01/10/22 1002 01/10/22 1025  BP: (!) 164/93 (!) 153/81  Pulse: 65 64  Temp: (!) 97.5 F (36.4 C)   TempSrc: Oral   SpO2: 98%   Weight: 218 lb 1.6 oz (98.9 kg)   Height: '6\' 1"'$  (1.854 m)    Physical Exam Vitals reviewed.  Constitutional:      General: He is not in acute distress.    Appearance: Normal appearance.  HENT:     Head: Normocephalic and atraumatic.  Cardiovascular:  Rate and Rhythm: Normal rate and regular rhythm.     Heart sounds: No murmur heard.    No friction rub. No gallop.  Pulmonary:     Effort: Pulmonary effort is normal. No respiratory distress.     Breath sounds: Normal breath sounds. No wheezing.  Abdominal:     General: Abdomen is flat. There is no distension.     Palpations: Abdomen is soft.     Tenderness: There is no abdominal tenderness.  Skin:    General: Skin is warm and dry.  Neurological:     Mental Status: He is alert and oriented to person, place, and time.       Assessment & Plan:   TOBACCO ABUSE Patient reports that he is still smoking around 1 pack/day. We discussed how smoking is a risk factor for exacerbations of his chronic conditions such as CAD. He expressed that he is not interested in quitting at this time. Will plan to follow up at a future visit.   Hyperlipidemia Patient has chronic stable hyperlipidemia managed with rosuvastatin '20mg'$  daily. He reports adherence but occasionally skips doses when he feels that rosuvastatin is contributing to dizziness and fatigue, which he estimates to be twice a month. We discussed that these are not side effects we attribute to statins and emphasized the importance of taking his medications as prescribed. Patient expressed understanding. No further work-up at this visit.     Chest pain Patient denies any current chest pain. We discussed starting aspirin '81mg'$  daily in addition to the rosuvastatin. Patient is concerned that aspirin would make him unable to clot blood if he had a wound. We discussed the cardiovascular benefits and low risk of adverse bleeding, but patient is not interested in starting aspirin today. Will plan to follow up at a future visit.   BPH associated with nocturia Patient has chronic nocturia managed with Cardura. Patient reports that he is still waking up 4-5 times a night and that it is negatively affecting his quality of sleep. He has been following with [facility] since his last visit. He has had repeated elevated PSA levels, including post-treatment with Ciprofloxacin, and patient is scheduled for a prostate biopsy next month. Will follow up next visit after his biopsy.  Essential hypertension Patient has chronic poorly-controlled hypertension managed with Losartan '100mg'$  and doxazosin '2mg'$  and patient reports good adherence to regimen. BP in office today was 153/81. Patient does not take blood pressure readings at home. Patient has been very resistant to any  medication changes for other problems during this visit. Given elevated BP in office today would still recommend adding amlodipine 2.'5mg'$  but will plan to defer this discussion to the next visit.   Type 2 diabetes mellitus with other specified complication [facility]) Patient has well-controlled diabetes managed with Jardiance '25mg'$  daily and reports good adherence. His non-fasting glucose in office today was 115. Plan is to check A1C, BMP, and urine microalbumin/cr today. His A1C was collected today but the sample was inadequate for processing, so will reach out for him to return to lab this week for a repeat.

## 2022-01-10 NOTE — Assessment & Plan Note (Signed)
Patient reports that he is still smoking around 1 pack/day. We discussed how smoking is a risk factor for exacerbations of his chronic conditions such as CAD. He expressed that he is not interested in quitting at this time. Will plan to follow up at a future visit.

## 2022-01-10 NOTE — Assessment & Plan Note (Addendum)
Patient denies any current chest pain. We discussed starting aspirin '81mg'$  daily in addition to the rosuvastatin. Patient is concerned that aspirin would make him unable to clot blood if he had a wound. We discussed the cardiovascular benefits and low risk of adverse bleeding, but patient is not interested in starting aspirin today. Will plan to follow up at a future visit.

## 2022-01-11 DIAGNOSIS — I700 Atherosclerosis of aorta: Secondary | ICD-10-CM | POA: Insufficient documentation

## 2022-01-11 LAB — BMP8+ANION GAP
Anion Gap: 16 mmol/L (ref 10.0–18.0)
BUN/Creatinine Ratio: 13 (ref 10–24)
BUN: 14 mg/dL (ref 8–27)
CO2: 23 mmol/L (ref 20–29)
Calcium: 9.5 mg/dL (ref 8.6–10.2)
Chloride: 101 mmol/L (ref 96–106)
Creatinine, Ser: 1.12 mg/dL (ref 0.76–1.27)
Glucose: 114 mg/dL — ABNORMAL HIGH (ref 70–99)
Potassium: 4.9 mmol/L (ref 3.5–5.2)
Sodium: 140 mmol/L (ref 134–144)
eGFR: 72 mL/min/{1.73_m2} (ref >59)

## 2022-01-11 LAB — MICROALBUMIN / CREATININE URINE RATIO
Creatinine, Urine: 30.9 mg/dL
Microalb/Creat Ratio: 23 mg/g creat (ref 0–29)
Microalbumin, Urine: 7.1 ug/mL

## 2022-01-11 NOTE — Progress Notes (Signed)
Attestation for Student Documentation:  I personally was present and performed or re-performed the history, physical exam and medical decision-making activities of this service and have verified that the service and findings are accurately documented in the student's note.  Aldine Contes, MD 01/11/2022, 9:06 AM

## 2022-01-11 NOTE — Assessment & Plan Note (Signed)
-   This problem is chronic and stable - Patient states he is unhappy with his fluphenazine and thinks it may be causing him issues. I explained to him that he should discuss this with his psychiatrist and see if there is an alternative for him - He remains compliant with his medications - No further work up for now

## 2022-01-11 NOTE — Assessment & Plan Note (Signed)
-   We discussed the Shingrix vaccine today. Patient is unsure if he wants to take it at this time.  - Will follow up at next visit

## 2022-01-11 NOTE — Assessment & Plan Note (Signed)
-   Patient was noted to have an elevated PSA at his last visit and was referred to urology - He was treated with a prolonged course of cipro given concern for possible underlying infection - PSA has remained elevated despite Rx with Cipro - He is scheduled to have a prostate biopsy with urology to rule out prostate cancer

## 2022-01-11 NOTE — Assessment & Plan Note (Signed)
-   Patient noted to have aortic atherosclerosis on CT done in November - Patient is on high intensity statin but would benefit from addition of ASA 81 mg - Patient currently refusing to start asa as he is worried about bleeding risk - Will discuss again at next visit

## 2022-01-18 ENCOUNTER — Ambulatory Visit (HOSPITAL_BASED_OUTPATIENT_CLINIC_OR_DEPARTMENT_OTHER): Payer: Medicare Other | Admitting: *Deleted

## 2022-01-18 ENCOUNTER — Encounter (HOSPITAL_COMMUNITY): Payer: Self-pay | Admitting: *Deleted

## 2022-01-18 VITALS — BP 145/89 | HR 74 | Resp 18 | Ht 73.0 in | Wt 216.4 lb

## 2022-01-18 DIAGNOSIS — F200 Paranoid schizophrenia: Secondary | ICD-10-CM

## 2022-01-18 NOTE — Patient Instructions (Signed)
Pt presents today for due Prolixin 25 mg injection. Pt pleasant and cooperative on approach. Afffect appropriate to mood. Per usual presentation pt religiously preoccupied and at times tangential with loose associations. Injection prepared as ordered and given in RUOQ with no complaints. Pt to return in two weeks for next due injection. Pt agrees to call with questions or concerns before next visit. Pt agrees.

## 2022-01-24 ENCOUNTER — Telehealth: Payer: Self-pay | Admitting: *Deleted

## 2022-01-24 ENCOUNTER — Other Ambulatory Visit (INDEPENDENT_AMBULATORY_CARE_PROVIDER_SITE_OTHER): Payer: Medicare Other

## 2022-01-24 DIAGNOSIS — R972 Elevated prostate specific antigen [PSA]: Secondary | ICD-10-CM

## 2022-01-24 DIAGNOSIS — I10 Essential (primary) hypertension: Secondary | ICD-10-CM | POA: Diagnosis not present

## 2022-01-24 DIAGNOSIS — E1169 Type 2 diabetes mellitus with other specified complication: Secondary | ICD-10-CM | POA: Diagnosis not present

## 2022-01-24 LAB — POCT GLYCOSYLATED HEMOGLOBIN (HGB A1C): Hemoglobin A1C: 6.3 % — AB (ref 4.0–5.6)

## 2022-01-24 LAB — GLUCOSE, CAPILLARY: Glucose-Capillary: 154 mg/dL — ABNORMAL HIGH (ref 70–99)

## 2022-01-25 LAB — CBC WITH DIFFERENTIAL/PLATELET
Basophils Absolute: 0 10*3/uL (ref 0.0–0.2)
Basos: 1 %
EOS (ABSOLUTE): 0.1 10*3/uL (ref 0.0–0.4)
Eos: 2 %
Hematocrit: 49.5 % (ref 37.5–51.0)
Hemoglobin: 15.4 g/dL (ref 13.0–17.7)
Immature Grans (Abs): 0 10*3/uL (ref 0.0–0.1)
Immature Granulocytes: 0 %
Lymphocytes Absolute: 2.7 10*3/uL (ref 0.7–3.1)
Lymphs: 43 %
MCH: 24.2 pg — ABNORMAL LOW (ref 26.6–33.0)
MCHC: 31.1 g/dL — ABNORMAL LOW (ref 31.5–35.7)
MCV: 78 fL — ABNORMAL LOW (ref 79–97)
Monocytes Absolute: 0.5 10*3/uL (ref 0.1–0.9)
Monocytes: 9 %
Neutrophils Absolute: 2.9 10*3/uL (ref 1.4–7.0)
Neutrophils: 45 %
Platelets: 151 10*3/uL (ref 150–450)
RBC: 6.36 x10E6/uL — ABNORMAL HIGH (ref 4.14–5.80)
RDW: 15.7 % — ABNORMAL HIGH (ref 11.6–15.4)
WBC: 6.3 10*3/uL (ref 3.4–10.8)

## 2022-01-26 ENCOUNTER — Telehealth: Payer: Self-pay | Admitting: Internal Medicine

## 2022-01-26 NOTE — Telephone Encounter (Signed)
I called the patient to discuss results of his blood work with him.  His A1c is at goal at 6.3.  We will continue with Jardiance at 25 mg daily.  CBC was within normal limits except for a mildly decreased MCV and increased RDW.  No further work-up required at this time.  Patient expressed understanding and is agreement with plan.

## 2022-02-01 ENCOUNTER — Ambulatory Visit (HOSPITAL_BASED_OUTPATIENT_CLINIC_OR_DEPARTMENT_OTHER): Payer: Medicare Other | Admitting: *Deleted

## 2022-02-01 VITALS — BP 115/74 | HR 87 | Resp 18 | Ht 73.0 in | Wt 214.2 lb

## 2022-02-01 DIAGNOSIS — F200 Paranoid schizophrenia: Secondary | ICD-10-CM

## 2022-02-01 NOTE — Patient Instructions (Signed)
Pt presents today for due Prolixin 25 mg injection. Pt is appropriate and cooperative on approach; hyper religiosity per usual presentation. Pt stated that he was a bit sore from a prostate biopsy he had yesterday, but feeling much better today. Pt denies any HI, SI., paranoia.  VSS. Injection prepared as ordered and given in RUOQ without complaint. Pt requested injection be given in RUOQ due to having been given 3 injections in [place] yesterday. Will rotate site on next injection. Pt scheduled for next due injection in two weeks. Pt to see provider tomorrow.

## 2022-02-10 ENCOUNTER — Encounter (HOSPITAL_COMMUNITY): Payer: Self-pay | Admitting: Psychiatry

## 2022-02-10 ENCOUNTER — Telehealth (HOSPITAL_BASED_OUTPATIENT_CLINIC_OR_DEPARTMENT_OTHER): Payer: Medicare Other | Admitting: Psychiatry

## 2022-02-10 DIAGNOSIS — G2401 Drug induced subacute dyskinesia: Secondary | ICD-10-CM

## 2022-02-10 DIAGNOSIS — F200 Paranoid schizophrenia: Secondary | ICD-10-CM | POA: Diagnosis not present

## 2022-02-10 MED ORDER — FLUPHENAZINE DECANOATE 25 MG/ML IJ SOLN: 25.0000 mg | INTRAMUSCULAR | 2 refills | Status: DC

## 2022-02-10 NOTE — Progress Notes (Signed)
Virtual Visit via Telephone Note  I connected with Wayne Schmidt on 02/10/22 at 10:20 AM EDT by telephone and verified that I am speaking with the correct person using two identifiers.  Location: Patient: Home Provider: Home Office   I discussed the limitations, risks, security and privacy concerns of performing an evaluation and management service by telephone and the availability of in person appointments. I also discussed with the patient that there may be a patient responsible charge related to this service. The patient expressed understanding and agreed to proceed.   History of Present Illness: Patient is evaluated by phone session.  He is taking Prolixin injection and that is helping his sleep, thinking.  He has chronic paranoia and ruminative thoughts but manageable and is stable.  He sleeps okay.  Lately he is having issues with his cable and telephone.  He can only receive call but cannot make the call.  He is not sure why because he is paying his bills on time.  He remains preoccupied religiously and sometimes talk to himself but denies any anger, agitation, mood swing, suicidal thoughts or any homicidal thoughts.  His appetite is okay.  He has chronic tremor but does not want to address in the session and does not want to take the medication.  His energy level is okay.  He is pleased that he has no more urinary issues.  He does try to walk outside and sometime try to spend time with the family in [place].  He denies drinking or using any illegal substances.  His last hemoglobin A1c further improved to 6.3.  He wants to keep the current medication.  Past Psychiatric History: Reviewed. H/O schizophrenia with multiple inpatient treatment. H/O of hallucination, paranoia and suicidal attempt by overdose.  Last inpatient at [facility]. Took Haldol and Thorazine but had side effects.  On Prolixin injection for many years.  Tried hydroxyzine and Ingreeza but could not tolerate.   Psychiatric  Specialty Exam: Physical Exam  Review of Systems  Weight 214 lb (97.1 kg).There is no height or weight on file to calculate BMI.  General Appearance: NA  Eye Contact:  NA  Speech:  Slow  Volume:  Decreased  Mood:  Euthymic  Affect:  NA  Thought Process:  Descriptions of Associations: Tangential  Orientation:  Full (Time, Place, and Person)  Thought Content:  Tangential  Suicidal Thoughts:  No  Homicidal Thoughts:  No  Memory:  Immediate;   Fair Recent;   Fair Remote;   Fair  Judgement:  Fair  Insight:  Shallow  Psychomotor Activity:  Tremor  Concentration:  Concentration: Fair and Attention Span: Fair  Recall:  [employer] of Knowledge:  Fair  Language:  Fair  Akathisia:  No  Handed:  Right  AIMS (if indicated):     Assets:  Communication Skills Desire for Improvement Housing Transportation  ADL's:  Intact  Cognition:  WNL  Sleep:   ok      Assessment and Plan: Schizophrenia chronic paranoid type.  Tardive dyskinesia.  I reviewed blood work results patient hemoglobin A1c is improved.  He does not want to change the medication or take anything for his chronic tremors.  Continue Prolixin 25 mg intramuscular every 2 weeks.  Recommended to call us back with any question or any concern.  Follow-up in 3 months.  Follow Up Instructions:    I discussed the assessment and treatment plan with the patient. The patient was provided an opportunity to ask questions and all were  answered. The patient agreed with the plan and demonstrated an understanding of the instructions.   The patient was advised to call back or seek an in-person evaluation if the symptoms worsen or if the condition fails to improve as anticipated.  Collaboration of Care: Primary Care Provider AEB codes are available in epic to review.  Patient/Guardian was advised Release of Information must be obtained prior to any record release in order to collaborate their care with an outside provider. Patient/Guardian  was advised if they have not already done so to contact the registration department to sign all necessary forms in order for [place] to release information regarding their care.   Consent: Patient/Guardian gives verbal consent for treatment and assignment of benefits for services provided during this visit. Patient/Guardian expressed understanding and agreed to proceed.    I provided 19 minutes of non-face-to-face time during this encounter.   [name], MD

## 2022-02-15 ENCOUNTER — Ambulatory Visit (HOSPITAL_BASED_OUTPATIENT_CLINIC_OR_DEPARTMENT_OTHER): Payer: Medicare Other | Admitting: *Deleted

## 2022-02-15 ENCOUNTER — Encounter (HOSPITAL_COMMUNITY): Payer: Self-pay | Admitting: *Deleted

## 2022-02-15 VITALS — BP 137/83 | HR 76 | Resp 20 | Ht 73.0 in | Wt 214.0 lb

## 2022-02-15 DIAGNOSIS — F200 Paranoid schizophrenia: Secondary | ICD-10-CM

## 2022-02-15 NOTE — Patient Instructions (Signed)
Pt presents today for due Prolixin 25 mg injection. Pt in a good mood this morning. Affect appropriate to mood. Pt cooperative on approach. Pt, per usual  presentation, can be disorganized in thought but less religious preoccupation this visit. VSS. Injection prepared as ordered and given in LUOQ without complaint. Pt denies SI, HI. Wayne Schmidt is to return in 2 weeks for next due injection. Pt agrees to call this office with any questions or concerns prior to next visit.

## 2022-03-01 ENCOUNTER — Encounter (HOSPITAL_COMMUNITY): Payer: Self-pay

## 2022-03-01 ENCOUNTER — Ambulatory Visit (HOSPITAL_BASED_OUTPATIENT_CLINIC_OR_DEPARTMENT_OTHER): Payer: Medicare Other | Admitting: Psychiatry

## 2022-03-01 VITALS — BP 133/74 | HR 74 | Ht 73.0 in | Wt 218.0 lb

## 2022-03-01 DIAGNOSIS — F200 Paranoid schizophrenia: Secondary | ICD-10-CM

## 2022-03-01 NOTE — Progress Notes (Signed)
Patient arrived for q 2 week -- fluPHENAZine decanoate (PROLIXIN) injection 25 mg  ** TOLERATED INJECTION RUOQ Patient very Pleasant & NO AH/VH  NOR  HI/SI

## 2022-03-08 ENCOUNTER — Other Ambulatory Visit: Payer: Self-pay | Admitting: Internal Medicine

## 2022-03-08 DIAGNOSIS — E1169 Type 2 diabetes mellitus with other specified complication: Secondary | ICD-10-CM

## 2022-03-15 ENCOUNTER — Ambulatory Visit (HOSPITAL_BASED_OUTPATIENT_CLINIC_OR_DEPARTMENT_OTHER): Payer: Medicare Other | Admitting: *Deleted

## 2022-03-15 VITALS — BP 152/82 | HR 79 | Resp 18 | Ht 73.0 in | Wt 213.8 lb

## 2022-03-15 DIAGNOSIS — F200 Paranoid schizophrenia: Secondary | ICD-10-CM

## 2022-03-15 NOTE — Patient Instructions (Addendum)
Pt presents today for due Prolixin 25 mg injection. Pt cooperative and pleasant on approach. Pt thoughts baseline disorganized with hyper religiosity. Pt questioning whether he should get off the Prolixin. Writer advised that this medication has kept him out of the hospital for many years however pt stated that it was God who has done that. Writer advised best to maintain medication regime and keep f/u appointments with Dr. Adele Schilder. Pt agrees and said he was just asking. Injection prepared as ordered and given in LUOQ without complaint. Pt to return in two weeks for next due injection.

## 2022-03-29 ENCOUNTER — Ambulatory Visit (HOSPITAL_BASED_OUTPATIENT_CLINIC_OR_DEPARTMENT_OTHER): Payer: Medicare Other | Admitting: *Deleted

## 2022-03-29 ENCOUNTER — Encounter (HOSPITAL_COMMUNITY): Payer: Self-pay | Admitting: *Deleted

## 2022-03-29 VITALS — BP 156/86 | HR 88 | Resp 18 | Ht 73.0 in | Wt 214.2 lb

## 2022-03-29 DIAGNOSIS — F200 Paranoid schizophrenia: Secondary | ICD-10-CM

## 2022-03-29 NOTE — Patient Instructions (Addendum)
Pt presents to clinic today for due Prolixin 25 mg injection. Pt mood somewhat labile, tangential with disorganized thoughts. Pt is cooperative on approach. Injection prepared as ordered and given in RUOQ with no complaints. Pt to return in two weeks for next due injection. Pt agrees to call with any questions or concerns.

## 2022-04-11 ENCOUNTER — Encounter: Payer: Medicare Other | Admitting: Internal Medicine

## 2022-04-12 ENCOUNTER — Encounter (HOSPITAL_COMMUNITY): Payer: Self-pay | Admitting: *Deleted

## 2022-04-12 ENCOUNTER — Ambulatory Visit (HOSPITAL_BASED_OUTPATIENT_CLINIC_OR_DEPARTMENT_OTHER): Payer: Medicare Other | Admitting: *Deleted

## 2022-04-12 VITALS — BP 162/85 | HR 77 | Resp 20 | Ht 73.0 in | Wt 212.6 lb

## 2022-04-12 DIAGNOSIS — F200 Paranoid schizophrenia: Secondary | ICD-10-CM

## 2022-04-12 NOTE — Patient Instructions (Signed)
Pt in office today for due Prolixin 25 mg injection. Pt is cooperative on approach. Pt is religiously preoccupied and disorganized per usual presentation. Pt BP high @ 162/85. Pt is on antihypertensive medication and says that he did smoke prior to coming in the office. Pt says he follows up with PCP regarding hypertension. Injection prepared as ordered and given in LUOQ without complaint. Pt is to return in 2 weeks for next due injection.

## 2022-04-26 ENCOUNTER — Ambulatory Visit (HOSPITAL_BASED_OUTPATIENT_CLINIC_OR_DEPARTMENT_OTHER): Payer: Medicare Other | Admitting: *Deleted

## 2022-04-26 ENCOUNTER — Encounter (HOSPITAL_COMMUNITY): Payer: Self-pay | Admitting: *Deleted

## 2022-04-26 VITALS — BP 160/78 | HR 79 | Resp 18 | Ht 73.0 in | Wt 210.6 lb

## 2022-04-26 DIAGNOSIS — F200 Paranoid schizophrenia: Secondary | ICD-10-CM

## 2022-04-26 NOTE — Patient Instructions (Signed)
Pt in office today for due Prolixin 25 mg injection. Pt pleasant and cooperative on approach. Pt thoughts tangential with loose associations. Pt is wondering if this medication needs a dose change because he says he's been on so long and he thinks his body has "gotten used to it". Pt denies any AVH however. Injection prepared as ordered and given in RUOQ without complaint. Pt to return in two weeks for next due injection. Pt has refills available. Pt to return in 2 weeks for next due injection.

## 2022-05-02 ENCOUNTER — Encounter: Payer: Self-pay | Admitting: Student

## 2022-05-02 ENCOUNTER — Ambulatory Visit (INDEPENDENT_AMBULATORY_CARE_PROVIDER_SITE_OTHER): Payer: Medicare Other | Admitting: Student

## 2022-05-02 VITALS — BP 117/72 | HR 81 | Temp 97.8°F | Ht 73.0 in | Wt 210.5 lb

## 2022-05-02 DIAGNOSIS — F17210 Nicotine dependence, cigarettes, uncomplicated: Secondary | ICD-10-CM | POA: Diagnosis not present

## 2022-05-02 DIAGNOSIS — E1169 Type 2 diabetes mellitus with other specified complication: Secondary | ICD-10-CM

## 2022-05-02 DIAGNOSIS — I10 Essential (primary) hypertension: Secondary | ICD-10-CM | POA: Diagnosis not present

## 2022-05-02 DIAGNOSIS — Z7984 Long term (current) use of oral hypoglycemic drugs: Secondary | ICD-10-CM

## 2022-05-02 DIAGNOSIS — N401 Enlarged prostate with lower urinary tract symptoms: Secondary | ICD-10-CM | POA: Diagnosis not present

## 2022-05-02 DIAGNOSIS — Z23 Encounter for immunization: Secondary | ICD-10-CM | POA: Diagnosis not present

## 2022-05-02 DIAGNOSIS — R351 Nocturia: Secondary | ICD-10-CM | POA: Diagnosis not present

## 2022-05-02 DIAGNOSIS — Z0000 Encounter for general adult medical examination without abnormal findings: Secondary | ICD-10-CM

## 2022-05-02 LAB — POCT GLYCOSYLATED HEMOGLOBIN (HGB A1C): Hemoglobin A1C: 6.9 % — AB (ref 4.0–5.6)

## 2022-05-02 LAB — GLUCOSE, CAPILLARY: Glucose-Capillary: 120 mg/dL — ABNORMAL HIGH (ref 70–99)

## 2022-05-02 NOTE — Patient Instructions (Addendum)
Thank you, [name] for allowing [place] to provide your care today.   -Diabetes: Well controlled today, A1c today was 6.9%. Please continue taking the Jardiance daily. I am glad you have an appointment with your eye doctor this week.   -Blood pressure looks good today. Please continue your Doxazosin and Losartan.    -I am glad to hear your prostate biopsy was negative. Please continue following with [facility].   -Flu shot today.   I have ordered the following labs for you:  Lab Orders         Glucose, capillary         POC Hbg A1C       I have ordered the following medication/changed the following medications:   Stop the following medications: There are no discontinued medications.   Start the following medications: No orders of the defined types were placed in this encounter.    Follow up: 3 months   Should you have any questions or concerns please call the internal medicine clinic at [phone].    Wayne Schmidt, D.O. [place]

## 2022-05-02 NOTE — Assessment & Plan Note (Addendum)
Patient with well-controlled diabetes. A1c today was 6.9%.  He is on Jardiance 25 mg daily and reports good adherence. He is due for an ophthalmology exam, has appointment scheduled this week.  Plan -Continue Jardiance

## 2022-05-02 NOTE — Assessment & Plan Note (Signed)
BP: 117/72  BP is well controlled today.  He is on losartan 100 mg and doxazosin 2 mg daily. Tolerating both medications with no side effects. He is asymptomatic today.  Plan -Continue losartan and doxazosin

## 2022-05-02 NOTE — Assessment & Plan Note (Signed)
Patient follows with [facility].  He is on doxazosin 2 mg daily. Prostate biopsy was completed and was negative for malignancy.  He has an appointment in next month for follow-up visit and recheck PSA.

## 2022-05-02 NOTE — Progress Notes (Signed)
CC: Routine follow-up  HPI:  [name] is a 66 y.o. male living with a history stated below and presents today for routine follow-up. Please see problem based assessment and plan for additional details.  Past Medical History:  Diagnosis Date   Blood creatinine increased compared with prior measurement 07/29/2019   Diabetes mellitus without complication ([place])    High risk sexual behavior 10/2007   Hypertension    Microcytosis    Schizophrenia ([place])    Substance abuse ([place])    tobacco use   UTI (urinary tract infection) 08/24/2016   Admitted 08/23/16-08/26/16 with several day history of UTI symptoms + positive UA. Culture resulted pan-sensitive E.coli. Was treated with IV Ceftriaxone and d/c home with Bactrim to complete 10 day course.     Current Outpatient Medications on File Prior to Visit  Medication Sig Dispense Refill   doxazosin (CARDURA) 2 MG tablet TAKE (1) TABLET BY MOUTH ONCE DAILY. 30 tablet 5   empagliflozin (JARDIANCE) 25 MG TABS tablet TAKE (1) TABLET BY MOUTH ONCE DAILY BEFORE BREAKFAST. 90 tablet 3   fluPHENAZine decanoate (PROLIXIN) 25 MG/ML injection Inject 1 mL (25 mg total) into the muscle every 14 (fourteen) days. 5 mL 2   hydroxypropyl methylcellulose / hypromellose (ISOPTO TEARS / GONIOVISC) 2.5 % ophthalmic solution Place 2 drops into both eyes daily as needed for dry eyes.     losartan (COZAAR) 100 MG tablet TAKE (1) TABLET BY MOUTH ONCE DAILY. 30 tablet 11   rosuvastatin (CRESTOR) 20 MG tablet TAKE (1) TABLET BY MOUTH ONCE DAILY. 30 tablet 11   VOLTAREN 1 % GEL Apply 2 g topically 4 (four) times daily. 20 g 10   Current Facility-Administered Medications on File Prior to Visit  Medication Dose Route Frequency Provider Last Rate Last Admin   fluPHENAZine decanoate (PROLIXIN) injection 25 mg  25 mg Intramuscular Q14 Days Arfeen, Arlyce Harman, MD   25 mg at 04/26/22 1104   Review of Systems: ROS negative except for what is noted on the assessment and  plan.  Vitals:   05/02/22 1008  BP: 117/72  Pulse: 81  Temp: 97.8 F (36.6 C)  TempSrc: Oral  SpO2: 100%  Weight: 210 lb 8 oz (95.5 kg)  Height: '6\' 1"'$  (1.854 m)   Physical Exam: Constitutional: well-appearing male, sitting in chair, in no acute distress HENT: normocephalic atraumatic Neck: supple Cardiovascular: regular rate and rhythm, no m/r/g Pulmonary/Chest: normal work of breathing on room air, lungs clear to auscultation bilaterally MSK: normal bulk and tone Neurological: alert & oriented x 3 Skin: warm and dry Psych: normal mood and behavior  Assessment & Plan:   Healthcare maintenance Patient received flu shot today.  Type 2 diabetes mellitus with other specified complication [facility]) Patient with well-controlled diabetes. A1c today was 6.9%.  He is on Jardiance 25 mg daily and reports good adherence. He is due for an ophthalmology exam, has appointment scheduled this week.  Plan -Continue Jardiance  Essential hypertension BP: 117/72  BP is well controlled today.  He is on losartan 100 mg and doxazosin 2 mg daily. Tolerating both medications with no side effects. He is asymptomatic today.  Plan -Continue losartan and doxazosin  BPH associated with nocturia Patient follows with [facility].  He is on doxazosin 2 mg daily. Prostate biopsy was completed and was negative for malignancy.  He has an appointment in next month for follow-up visit and recheck PSA.    Patient seen with Dr. Ronna Polio,  D.O. [facility], PGY-1 Phone: [phone] Date 05/02/2022 Time 7:57 PM

## 2022-05-02 NOTE — Assessment & Plan Note (Signed)
Patient received flu shot today 

## 2022-05-03 DIAGNOSIS — E119 Type 2 diabetes mellitus without complications: Secondary | ICD-10-CM | POA: Diagnosis not present

## 2022-05-03 DIAGNOSIS — H52203 Unspecified astigmatism, bilateral: Secondary | ICD-10-CM | POA: Diagnosis not present

## 2022-05-03 DIAGNOSIS — H2513 Age-related nuclear cataract, bilateral: Secondary | ICD-10-CM | POA: Diagnosis not present

## 2022-05-03 DIAGNOSIS — H25013 Cortical age-related cataract, bilateral: Secondary | ICD-10-CM | POA: Diagnosis not present

## 2022-05-03 LAB — HM DIABETES EYE EXAM

## 2022-05-05 NOTE — Progress Notes (Signed)
Internal Medicine Clinic Attending  I saw and evaluated the patient.  I personally confirmed the key portions of the history and exam documented by Dr. Zheng and I reviewed pertinent patient test results.  The assessment, diagnosis, and plan were formulated together and I agree with the documentation in the resident's note.  

## 2022-05-08 ENCOUNTER — Other Ambulatory Visit: Payer: Self-pay | Admitting: Internal Medicine

## 2022-05-08 DIAGNOSIS — E785 Hyperlipidemia, unspecified: Secondary | ICD-10-CM

## 2022-05-08 DIAGNOSIS — I10 Essential (primary) hypertension: Secondary | ICD-10-CM

## 2022-05-10 ENCOUNTER — Ambulatory Visit (HOSPITAL_COMMUNITY): Payer: Medicare Other

## 2022-05-11 ENCOUNTER — Telehealth (HOSPITAL_BASED_OUTPATIENT_CLINIC_OR_DEPARTMENT_OTHER): Payer: Medicare Other | Admitting: Psychiatry

## 2022-05-11 ENCOUNTER — Encounter (HOSPITAL_COMMUNITY): Payer: Self-pay | Admitting: Psychiatry

## 2022-05-11 DIAGNOSIS — F200 Paranoid schizophrenia: Secondary | ICD-10-CM

## 2022-05-11 DIAGNOSIS — G2401 Drug induced subacute dyskinesia: Secondary | ICD-10-CM

## 2022-05-11 MED ORDER — FLUPHENAZINE DECANOATE 25 MG/ML IJ SOLN: 25.0000 mg | INTRAMUSCULAR | 2 refills | Status: DC

## 2022-05-11 NOTE — Progress Notes (Signed)
Virtual Visit via Telephone Note  I connected with Wayne Schmidt on 05/11/22 at 10:40 AM EST by telephone and verified that I am speaking with the correct person using two identifiers.  Location: Patient: Home Provider: Home Office   I discussed the limitations, risks, security and privacy concerns of performing an evaluation and management service by telephone and the availability of in person appointments. I also discussed with the patient that there may be a patient responsible charge related to this service. The patient expressed understanding and agreed to proceed.   History of Present Illness: Patient is evaluated by phone session.  He is concerned about his insurance because he is not sure as not able to pick up the medicine prescribed by his PCP.  Change in the insurance.  Patient do not recall requesting to change of the insurance and he like to investigate and called his insurance.  He received last injection on October 25.  He is hoping to resolve the issue before his next injection.  Patient has chronic symptoms and sometimes he ruminates and thought process tangential but denies any hallucination, suicidal thoughts.  Recently he had a visit to his PCP and his hemoglobin A1c is slightly increased from the past.  But he is pleased that is still less than 7.  He preoccupied with his sleep and sometimes talk to himself but denies any agitation, anger.  He sleeps good.  He does not leave the house unless he has to go to grocery store.  Sometimes he does walk but not on a regular basis.  His family lives in [place].  He denies drinking or using any illegal substances.  His appetite is okay.  His weight is unchanged from the past.   Past Psychiatric History: Reviewed. H/O schizophrenia with multiple inpatient treatment. H/O of hallucination, paranoia and suicidal attempt by overdose.  Last inpatient at [facility]. Took Haldol and Thorazine but had side effects.  On Prolixin injection for many  years.  Tried hydroxyzine and Ingreeza but could not tolerate.  Recent Results (from the past 2160 hour(s))  Glucose, capillary     Status: Abnormal   Collection Time: 05/02/22 10:05 AM  Result Value Ref Range   Glucose-Capillary 120 (H) 70 - 99 mg/dL    Comment: Glucose reference range applies only to samples taken after fasting for at least 8 hours.  POC Hbg A1C     Status: Abnormal   Collection Time: 05/02/22 10:15 AM  Result Value Ref Range   Hemoglobin A1C 6.9 (A) 4.0 - 5.6 %   HbA1c POC (<> result, manual entry)     HbA1c, POC (prediabetic range)     HbA1c, POC (controlled diabetic range)        Psychiatric Specialty Exam: Physical Exam  Review of Systems  Weight 210 lb (95.3 kg).There is no height or weight on file to calculate BMI.  General Appearance: NA  Eye Contact:  NA  Speech:  Slow  Volume:  Decreased  Mood:  Euthymic  Affect:  NA  Thought Process:  Descriptions of Associations: Tangential  Orientation:  Full (Time, Place, and Person)  Thought Content:  Rumination  Suicidal Thoughts:  No  Homicidal Thoughts:  No  Memory:  Immediate;   Fair Recent;   Fair Remote;   Fair  Judgement:  Fair  Insight:  Fair  Psychomotor Activity:  Tremor  Concentration:  Concentration: Fair and Attention Span: Fair  Recall:  [employer] of Knowledge:  Fair  Language:  Fair  Akathisia:  No  Handed:  Right  AIMS (if indicated):     Assets:  Communication Skills Desire for Improvement Housing Transportation  ADL's:  Intact  Cognition:  WNL  Sleep:   ok      Assessment and Plan: Schizophrenia chronic paranoid type.  Tardive dyskinesia.  I reviewed blood work results.  His hemoglobin A1c is 6.9.  Patient has chronic symptoms of paranoia, mild thought disorder but stable and he is not agitated or having any suicidal thoughts.  I encourage to discuss with his insurance company to resolve the issue before his next injection.  Patient agreed to work on it.  We will continue  Prolixin 25 mg intramuscular every 2 weeks.  He does not want to take anything for the tremors.  We talk about noncompliance with medication with Prolixin may cause decompensation of his illness.  Follow-up in 3 months.  He has planned to spend time with the family and upcoming holidays.  Recommended to call us back if is any question, concern or if he feels worsening of the symptoms.  Follow Up Instructions:    I discussed the assessment and treatment plan with the patient. The patient was provided an opportunity to ask questions and all were answered. The patient agreed with the plan and demonstrated an understanding of the instructions.   The patient was advised to call back or seek an in-person evaluation if the symptoms worsen or if the condition fails to improve as anticipated.  Collaboration of Care: Other provider involved in patient's care AEB notes are available in epic to review.  Patient/Guardian was advised Release of Information must be obtained prior to any record release in order to collaborate their care with an outside provider. Patient/Guardian was advised if they have not already done so to contact the registration department to sign all necessary forms in order for [place] to release information regarding their care.   Consent: Patient/Guardian gives verbal consent for treatment and assignment of benefits for services provided during this visit. Patient/Guardian expressed understanding and agreed to proceed.    I provided 21 minutes of non-face-to-face time during this encounter.   [name], MD

## 2022-05-17 ENCOUNTER — Telehealth (HOSPITAL_COMMUNITY): Payer: Self-pay | Admitting: *Deleted

## 2022-05-17 NOTE — Telephone Encounter (Signed)
Definitely. Imay reach out to his daughter and see if she can find out what pharmacy he's getting meds from now.

## 2022-05-17 NOTE — Telephone Encounter (Signed)
Thanks for the update.  We may have to call again a few days later if he did not schedule appointment for injection.  I am concerned he will decompensate without Prolixin injection.

## 2022-05-17 NOTE — Telephone Encounter (Signed)
Writer spoke with pt today to inquire on the status of the Prolixin prescription. Pt states he's using a new pharmacy and he's been working with them he says but sounds like they may be low on stock. Pt state that he's feels "good", better actually without the injection. Pt thoughts somewhat disorganized and hyper religiosity evident which is baseline baseline. Pt agrees to call this office to schedule appointment for injection as soon as he gets his medication. Pt was not sure of name of new pharmacy provider and when writer called [facility] they did not know either. FYI.

## 2022-05-18 ENCOUNTER — Other Ambulatory Visit: Payer: Self-pay | Admitting: *Deleted

## 2022-05-18 ENCOUNTER — Telehealth (HOSPITAL_COMMUNITY): Payer: Self-pay | Admitting: *Deleted

## 2022-05-18 ENCOUNTER — Other Ambulatory Visit (HOSPITAL_COMMUNITY): Payer: Self-pay | Admitting: *Deleted

## 2022-05-18 DIAGNOSIS — E785 Hyperlipidemia, unspecified: Secondary | ICD-10-CM

## 2022-05-18 DIAGNOSIS — I10 Essential (primary) hypertension: Secondary | ICD-10-CM

## 2022-05-18 DIAGNOSIS — G2401 Drug induced subacute dyskinesia: Secondary | ICD-10-CM

## 2022-05-18 DIAGNOSIS — F200 Paranoid schizophrenia: Secondary | ICD-10-CM

## 2022-05-18 DIAGNOSIS — E1169 Type 2 diabetes mellitus with other specified complication: Secondary | ICD-10-CM

## 2022-05-18 MED ORDER — FLUPHENAZINE DECANOATE 25 MG/ML IJ SOLN: 25.0000 mg | INTRAMUSCULAR | 2 refills | Status: DC

## 2022-05-18 NOTE — Progress Notes (Unsigned)
Patient will need prescriptions sent to his [facility].

## 2022-05-18 NOTE — Telephone Encounter (Signed)
Writer spoke with [facility] regarding pt Prolixin 25 mg injection ans was advised that order had not been transferred from previous pharmacy, [facility]. Prescription sent electronically to [facility] stated that if med is in stock they will mail out today, otherwise it will be ordered and sent tomorrow. Pt advised to come in as soon as he gets medication, even as a walk in. Pt replied "we'll see" and "maybe [name] is taking off the medication", which is not necessarily unusual for pt. FYI.

## 2022-05-19 ENCOUNTER — Other Ambulatory Visit: Payer: Self-pay

## 2022-05-19 DIAGNOSIS — E1169 Type 2 diabetes mellitus with other specified complication: Secondary | ICD-10-CM

## 2022-05-19 DIAGNOSIS — E785 Hyperlipidemia, unspecified: Secondary | ICD-10-CM

## 2022-05-19 DIAGNOSIS — I10 Essential (primary) hypertension: Secondary | ICD-10-CM

## 2022-05-19 MED ORDER — EMPAGLIFLOZIN 25 MG PO TABS: ORAL_TABLET | ORAL | 3 refills | Status: DC

## 2022-05-19 MED ORDER — DOXAZOSIN MESYLATE 2 MG PO TABS: ORAL_TABLET | ORAL | 5 refills | Status: DC

## 2022-05-19 MED ORDER — ROSUVASTATIN CALCIUM 20 MG PO TABS: ORAL_TABLET | ORAL | 3 refills | Status: DC

## 2022-05-19 MED ORDER — LOSARTAN POTASSIUM 100 MG PO TABS: ORAL_TABLET | ORAL | 3 refills | Status: DC

## 2022-05-23 ENCOUNTER — Encounter (HOSPITAL_COMMUNITY): Payer: Self-pay | Admitting: *Deleted

## 2022-05-23 ENCOUNTER — Ambulatory Visit (HOSPITAL_BASED_OUTPATIENT_CLINIC_OR_DEPARTMENT_OTHER): Payer: Medicare Other | Admitting: *Deleted

## 2022-05-23 VITALS — BP 176/96 | HR 71 | Resp 18 | Ht 73.0 in | Wt 210.2 lb

## 2022-05-23 DIAGNOSIS — F200 Paranoid schizophrenia: Secondary | ICD-10-CM

## 2022-05-23 NOTE — Patient Instructions (Addendum)
Pt in office today for due Prolixin Dec 25 mg injection. Pt is overdue for injection due to insurance changing pt pharmacy he says without his knowledge. Pt cooperative and pleasant on approach. Thoughts/speech disorganized, very religiously preoccupied, pt also mentioned "the judge" several times as well. Pt wouldn't specify but did endorse AVH. Pt denies SI or HI. Injection prepared as ordered and given in [facility] without complaint. BP elevated (176/96) which may be partially to do with smoking "8 cigarettes" prior to visit and also pt drove himself today. Pt scheduled to return in 2 weeks for next due injection.

## 2022-05-29 ENCOUNTER — Encounter: Payer: Self-pay | Admitting: Dietician

## 2022-06-06 ENCOUNTER — Ambulatory Visit (HOSPITAL_BASED_OUTPATIENT_CLINIC_OR_DEPARTMENT_OTHER): Payer: Medicare Other | Admitting: *Deleted

## 2022-06-06 ENCOUNTER — Encounter (HOSPITAL_COMMUNITY): Payer: Self-pay | Admitting: *Deleted

## 2022-06-06 VITALS — BP 153/85 | HR 90 | Resp 18 | Ht 73.0 in | Wt 211.0 lb

## 2022-06-06 DIAGNOSIS — F200 Paranoid schizophrenia: Secondary | ICD-10-CM

## 2022-06-06 DIAGNOSIS — F201 Disorganized schizophrenia: Secondary | ICD-10-CM | POA: Diagnosis not present

## 2022-06-06 NOTE — Patient Instructions (Signed)
Pt presents today for due Prolixin 25 mg injection. Pt pleasant and cooperative on approach. Affect appropriate to mood. Pt BP @ 153/85 today. Pt states that he smoked several cigarettes prior to coming to office. Dangers of HBP reinforced. Pt verbalizes understanding, but states he's not ready to quit. Dangers of smoking also reinforced. Injection prepared and given as ordered on [facility] without complaint. Pt to return in 2 weeks for next due injection.

## 2022-06-20 ENCOUNTER — Encounter (HOSPITAL_COMMUNITY): Payer: Self-pay | Admitting: *Deleted

## 2022-06-20 ENCOUNTER — Ambulatory Visit (HOSPITAL_BASED_OUTPATIENT_CLINIC_OR_DEPARTMENT_OTHER): Payer: Medicare Other | Admitting: *Deleted

## 2022-06-20 VITALS — BP 116/77 | HR 77 | Resp 18 | Wt 210.0 lb

## 2022-06-20 DIAGNOSIS — F200 Paranoid schizophrenia: Secondary | ICD-10-CM | POA: Diagnosis not present

## 2022-06-20 NOTE — Patient Instructions (Signed)
Pt in clinic today for due Prolixin 25 mg injection. Pt presents as pleasant, in a good mood, affect congruent. Pt remains religiously preoccupied and has some disorganized thoughts but is easily redirected if needed. Pt does not appear to be responding to internal stimuli however when asked about AVH pt responds "you know how I am". Pt denies any SI, Hi, or mood/thought issues since last injection (2 weeks ago). Injection prepared and given as ordered in [place] without complaint. Pt to return in two weeks for next due injection. Pt advised that we have enough medication for 2 more injections. Pt verbalizes understanding.

## 2022-06-21 ENCOUNTER — Encounter: Payer: Self-pay | Admitting: *Deleted

## 2022-07-03 HISTORY — PX: ABLATION, PROSTATE, TRANSURETHRAL, USING WATERJET: SHX7150

## 2022-07-04 ENCOUNTER — Ambulatory Visit (HOSPITAL_BASED_OUTPATIENT_CLINIC_OR_DEPARTMENT_OTHER): Payer: Medicare Other | Admitting: *Deleted

## 2022-07-04 ENCOUNTER — Encounter (HOSPITAL_COMMUNITY): Payer: Self-pay

## 2022-07-04 VITALS — BP 164/77 | HR 79 | Resp 18 | Ht 73.0 in | Wt 213.0 lb

## 2022-07-04 DIAGNOSIS — F200 Paranoid schizophrenia: Secondary | ICD-10-CM | POA: Diagnosis not present

## 2022-07-04 NOTE — Progress Notes (Signed)
Patient arrived for injection of Prolixin '25mg'$ . Given in [place]. No issues or complaints. Bright affect with pleasant mood. Talked about his Christmas and discussed his wife being in a rehab facility and liking it so much she doesn't want to come home. He was joking and smiling. Denies SI/HI. No AV hallucinations. No side effects from medications.

## 2022-07-18 ENCOUNTER — Encounter (HOSPITAL_COMMUNITY): Payer: Self-pay | Admitting: *Deleted

## 2022-07-18 ENCOUNTER — Ambulatory Visit (HOSPITAL_BASED_OUTPATIENT_CLINIC_OR_DEPARTMENT_OTHER): Payer: 59 | Admitting: *Deleted

## 2022-07-18 VITALS — BP 148/80 | HR 84 | Resp 18 | Ht 73.0 in | Wt 213.8 lb

## 2022-07-18 DIAGNOSIS — F200 Paranoid schizophrenia: Secondary | ICD-10-CM | POA: Diagnosis not present

## 2022-07-18 NOTE — Patient Instructions (Signed)
Pt presents to office today for due Prolixin 25 mg injection. Pt is bright, cooperative on approach. Pt denies any issues or c/o since last injection. Injection prepared and given as ordered in [name] without complaint. Pt focused on getting his taxes together but doesn't seem distressed about it. Wayne Schmidt is to return in two weeks for next due injection.

## 2022-08-01 ENCOUNTER — Ambulatory Visit (HOSPITAL_BASED_OUTPATIENT_CLINIC_OR_DEPARTMENT_OTHER): Payer: 59 | Admitting: *Deleted

## 2022-08-01 VITALS — BP 154/87 | HR 75 | Resp 18 | Ht 73.0 in | Wt 218.0 lb

## 2022-08-01 DIAGNOSIS — F200 Paranoid schizophrenia: Secondary | ICD-10-CM | POA: Diagnosis not present

## 2022-08-01 NOTE — Patient Instructions (Addendum)
Pt presents today for due Fluphenazine Decanoate 25 mg injection. Pt mood and affect bright. Pt hypertensive with a reading of 161/94 L arm, and 154/87 R arm. Pt is on antihypertensive medication but says that he has had a change in medication and doesn't feel it's been as effective. Pt to speak to PCP provider about this. Pt aware of CVA risk and consequences of hypertension. Injection prepared as ordered and given in [facility] without complaint. Pt to return in 2 weeks for next due injection. Pt will call with any questions or concerns.

## 2022-08-10 ENCOUNTER — Telehealth (HOSPITAL_BASED_OUTPATIENT_CLINIC_OR_DEPARTMENT_OTHER): Payer: 59 | Admitting: Psychiatry

## 2022-08-10 ENCOUNTER — Encounter (HOSPITAL_COMMUNITY): Payer: Self-pay | Admitting: Psychiatry

## 2022-08-10 DIAGNOSIS — G2401 Drug induced subacute dyskinesia: Secondary | ICD-10-CM | POA: Diagnosis not present

## 2022-08-10 DIAGNOSIS — F200 Paranoid schizophrenia: Secondary | ICD-10-CM | POA: Diagnosis not present

## 2022-08-10 MED ORDER — FLUPHENAZINE DECANOATE 25 MG/ML IJ SOLN: 25.0000 mg | INTRAMUSCULAR | 2 refills | Status: DC

## 2022-08-10 NOTE — Progress Notes (Signed)
Virtual Visit via Telephone Note  I connected with Zachery Conch on 08/10/22 at 10:20 AM EST by telephone and verified that I am speaking with the correct person using two identifiers.  Location: Patient: Home Provider: Office   I discussed the limitations, risks, security and privacy concerns of performing an evaluation and management service by telephone and the availability of in person appointments. I also discussed with the patient that there may be a patient responsible charge related to this service. The patient expressed understanding and agreed to proceed.   History of Present Illness: Patient is evaluated by phone session.  He is getting his injection of Prolixin every 2 weeks.  Last injection given on January 30.  Sometimes he admits or delayed but after reminder he is able to come back for injection.  He admitted some issues with insurance company but now he is getting the medication from the pharmacy on time.  Patient has chronic symptoms of paranoia, religiously preoccupied but no agitation or any suicidal thoughts.  He reported his car was not working and broke down but finally he was able to get his car back.  He does drive on and off but like to stay to himself most of the time.  He does talk to his daughter on a regular basis.  His family lives in [place].  When the weather is warm he does walk outside otherwise watch TV.  His thought process remains circumstantial and sometimes gets paranoia and ruminative thoughts but no homicidal thoughts.  He denies drinking or using any illegal substances.  He sleeps good and denies any weight gain or weight loss.  He has chronic tremors but does not want to take any medication.   Past Psychiatric History: Reviewed. H/O schizophrenia with multiple inpatient treatment. H/O of hallucination, paranoia and suicidal attempt by overdose.  Last inpatient at [facility]. Took Haldol and Thorazine but had side effects.  On Prolixin injection for many years.   Tried hydroxyzine and Ingreeza but could not tolerate.  Psychiatric Specialty Exam: Physical Exam  Review of Systems  Weight 218 lb (98.9 kg).There is no height or weight on file to calculate BMI.  General Appearance: NA  Eye Contact:  NA  Speech:  Slow  Volume:  Decreased  Mood:  Euthymic  Affect:  NA  Thought Process:  Descriptions of Associations: Circumstantial  Orientation:  Full (Time, Place, and Person)  Thought Content:  Rumination and religiously preoccupied   Suicidal Thoughts:  No  Homicidal Thoughts:  No  Memory:  Immediate;   Fair Recent;   Fair Remote;   Fair  Judgement:  Fair  Insight:  Shallow  Psychomotor Activity:  Decreased  Concentration:  Concentration: Fair and Attention Span: Fair  Recall:  [employer] of Knowledge:  Fair  Language:  Fair  Akathisia:  No  Handed:  Right  AIMS (if indicated):     Assets:  Communication Skills Desire for Improvement Housing Transportation  ADL's:  Intact  Cognition:  WNL  Sleep:   ok      Assessment and Plan: Schizophrenia chronic paranoid type.  Tardive dyskinesia.  Patient has chronic symptoms of paranoia, religiously preoccupied but stable.  He sometimes requires reminders to get Prolixin injection.  However he has no major concern or side effects from the medication.  We have tried getting medication for tardive dyskinesia but he does not want any medication.  In the past he was also very reluctant to take the injection but now with encouragement  he has been most of the time compliant with medication.  Discussed medication side effects and benefits.  Emphasized medication compliance and risk of relapse due to noncompliance with medication.  Recommended to call us back if is any question or any concern.  Follow-up in 3 months.  Continue Prolixin 25 mg intramuscular every 2 weeks.  Follow Up Instructions:    I discussed the assessment and treatment plan with the patient. The patient was provided an opportunity to  ask questions and all were answered. The patient agreed with the plan and demonstrated an understanding of the instructions.   The patient was advised to call back or seek an in-person evaluation if the symptoms worsen or if the condition fails to improve as anticipated.  Collaboration of Care: Other provider involved in patient's care AEB notes are available in epic to review.  Patient/Guardian was advised Release of Information must be obtained prior to any record release in order to collaborate their care with an outside provider. Patient/Guardian was advised if they have not already done so to contact the registration department to sign all necessary forms in order for [place] to release information regarding their care.   Consent: Patient/Guardian gives verbal consent for treatment and assignment of benefits for services provided during this visit. Patient/Guardian expressed understanding and agreed to proceed.    I provided 17 minutes of non-face-to-face time during this encounter.   [name], MD

## 2022-08-15 ENCOUNTER — Ambulatory Visit (HOSPITAL_BASED_OUTPATIENT_CLINIC_OR_DEPARTMENT_OTHER): Payer: 59 | Admitting: *Deleted

## 2022-08-15 VITALS — BP 134/83 | HR 77 | Resp 18 | Ht 73.0 in | Wt 215.0 lb

## 2022-08-15 DIAGNOSIS — F200 Paranoid schizophrenia: Secondary | ICD-10-CM | POA: Diagnosis not present

## 2022-08-15 NOTE — Patient Instructions (Signed)
Pt presents to office today foe due Prolixin Decanoate 125 mg injection. Pt mood happy, joking and laughing with staff. Affect appropriate to mood. Cooperative on approach. Pt says that he has had decreased sleep for the last few nights. Denies nightmares, AVH, SI or HI. Injection prepared as ordered and given in [facility] without complaint. Pt to return in 2 weeks for next due injection. Pt will call with any questions or concerns.

## 2022-08-24 ENCOUNTER — Ambulatory Visit (INDEPENDENT_AMBULATORY_CARE_PROVIDER_SITE_OTHER): Payer: 59 | Admitting: Internal Medicine

## 2022-08-24 ENCOUNTER — Other Ambulatory Visit: Payer: Self-pay

## 2022-08-24 ENCOUNTER — Encounter: Payer: Self-pay | Admitting: Internal Medicine

## 2022-08-24 ENCOUNTER — Ambulatory Visit: Payer: 59

## 2022-08-24 VITALS — BP 142/80 | HR 79 | Temp 98.0°F | Ht 73.0 in

## 2022-08-24 DIAGNOSIS — F17210 Nicotine dependence, cigarettes, uncomplicated: Secondary | ICD-10-CM

## 2022-08-24 DIAGNOSIS — R350 Frequency of micturition: Secondary | ICD-10-CM | POA: Diagnosis not present

## 2022-08-24 DIAGNOSIS — I10 Essential (primary) hypertension: Secondary | ICD-10-CM

## 2022-08-24 DIAGNOSIS — F200 Paranoid schizophrenia: Secondary | ICD-10-CM

## 2022-08-24 DIAGNOSIS — I700 Atherosclerosis of aorta: Secondary | ICD-10-CM | POA: Diagnosis not present

## 2022-08-24 DIAGNOSIS — F17200 Nicotine dependence, unspecified, uncomplicated: Secondary | ICD-10-CM

## 2022-08-24 DIAGNOSIS — E1169 Type 2 diabetes mellitus with other specified complication: Secondary | ICD-10-CM

## 2022-08-24 DIAGNOSIS — Z0000 Encounter for general adult medical examination without abnormal findings: Secondary | ICD-10-CM

## 2022-08-24 LAB — POCT GLYCOSYLATED HEMOGLOBIN (HGB A1C): Hemoglobin A1C: 7 % — AB (ref 4.0–5.6)

## 2022-08-24 LAB — GLUCOSE, CAPILLARY: Glucose-Capillary: 134 mg/dL — ABNORMAL HIGH (ref 70–99)

## 2022-08-24 MED ORDER — FLUTICASONE PROPIONATE 50 MCG/ACT NA SUSP: 1.0000 | Freq: Every day | NASAL | 2 refills | Status: DC

## 2022-08-24 NOTE — Progress Notes (Signed)
Subjective:   Wayne Schmidt is a 67 y.o. male who presents for Medicare Annual/Subsequent preventive examination.  He also would like to address his diabetes management this visit.  Please see problem based charting for additional details of the chronic condition management.  Patient notes his wife is a nurse facility after a stroke she has been unable to care for herself since that stroke.  I did take this opportunity to bring up the idea of advance care planning.  He notes that his daughter Wayne Schmidt would be his surrogate decision maker however he also has a son that is living and he thinks he may have a living will document.  Asked him to bring this to his next visit so that I am may incorporated into his electronic health care record.       Objective:    Today's Vitals   08/24/22 1014 08/24/22 1103  BP: (!) 142/81 (!) 142/80  Pulse: 68 79  Temp: 98 F (36.7 C)   TempSrc: Oral   SpO2: 100%   Height: 6' 1"$  (1.854 m)    Body mass index is 28.37 kg/m.     08/24/2022   10:12 AM 05/02/2022   11:06 AM 01/10/2022   10:05 AM 09/25/2021   10:58 PM 09/20/2021   11:08 AM 06/14/2021   10:29 AM 02/15/2021   10:54 AM  Advanced Directives  Does Patient Have a Medical Advance Directive? No No No No No No No  Would patient like information on creating a medical advance directive? No - Patient declined No - Patient declined No - Patient declined  No - Patient declined No - Patient declined No - Patient declined    Current Medications (verified) Outpatient Encounter Medications as of 08/24/2022  Medication Sig   fluticasone (FLONASE) 50 MCG/ACT nasal spray Place 1 spray into both nostrils daily.   doxazosin (CARDURA) 2 MG tablet TAKE (1) TABLET BY MOUTH ONCE DAILY.   empagliflozin (JARDIANCE) 25 MG TABS tablet TAKE (1) TABLET BY MOUTH ONCE DAILY BEFORE BREAKFAST.   fluPHENAZine decanoate (PROLIXIN) 25 MG/ML injection Inject 1 mL (25 mg total) into the muscle every 14 (fourteen) days.    hydroxypropyl methylcellulose / hypromellose (ISOPTO TEARS / GONIOVISC) 2.5 % ophthalmic solution Place 2 drops into both eyes daily as needed for dry eyes.   losartan (COZAAR) 100 MG tablet TAKE (1) TABLET BY MOUTH ONCE DAILY.   rosuvastatin (CRESTOR) 20 MG tablet TAKE (1) TABLET BY MOUTH ONCE DAILY.   VOLTAREN 1 % GEL Apply 2 g topically 4 (four) times daily.   Facility-Administered Encounter Medications as of 08/24/2022  Medication   fluPHENAZine decanoate (PROLIXIN) injection 25 mg    Allergies (verified) Ace inhibitors, Atorvastatin, Chlorpromazine hcl, and Simvastatin   History: Past Medical History:  Diagnosis Date   Blood creatinine increased compared with prior measurement 07/29/2019   Diabetes mellitus without complication ([place])    High risk sexual behavior 10/2007   Hypertension    Microcytosis    Schizophrenia ([place])    Substance abuse ([place])    tobacco use   UTI (urinary tract infection) 08/24/2016   Admitted 08/23/16-08/26/16 with several day history of UTI symptoms + positive UA. Culture resulted pan-sensitive E.coli. Was treated with IV Ceftriaxone and d/c home with Bactrim to complete 10 day course.    Past Surgical History:  Procedure Laterality Date   LAMINECTOMY     by Dr Rolin Barry   Family History  Problem Relation Age of Onset   Diabetes Mother  Hypertension Mother    Lupus Sister    Sickle cell anemia Brother    Drug abuse Brother    Social History   Socioeconomic History   Marital status: Legally Separated    Spouse name: Not on file   Number of children: Not on file   Years of education: Not on file   Highest education level: Not on file  Occupational History   Not on file  Tobacco Use   Smoking status: Every Day    Packs/day: 1.00    Years: 38.00    Total pack years: 38.00    Types: Cigarettes   Smokeless tobacco: Never  Vaping Use   Vaping Use: Never used  Substance and Sexual Activity   Alcohol use: Not Currently    Comment:  Occasional a 16 oz beer once a month    Drug use: No    Comment: h/o remote cocaine use-denies present use   Sexual activity: Not Currently  Other Topics Concern   Not on file  Social History Narrative   Patient given diabetes card 07/07/2010   Social Determinants of Health   Financial Resource Strain: Not on file  Food Insecurity: Not on file  Transportation Needs: Not on file  Physical Activity: Not on file  Stress: Not on file  Social Connections: Not on file    Tobacco Counseling Ready to quit: Yes Counseling given: Yes   Clinical Intake:  Pre-visit preparation completed: Yes  Pain : No/denies pain     Nutritional Risks: None Diabetes: Yes CBG done?: Yes CBG resulted in Enter/ Edit results?: Yes Did pt. bring in CBG monitor from home?: No  How often do you need to have someone help you when you read instructions, pamphlets, or other written materials from your doctor or pharmacy?: 1 - Never What is the last grade level you completed in school?: [place].  Diabetic?yes  Interpreter Needed?: No  Information entered by :: Lanell Persons 08/24/22@ 1005AM   Activities of Daily Living    08/24/2022   11:04 AM 08/24/2022   10:11 AM  In your present state of health, do you have any difficulty performing the following activities:  Hearing? 0 0  Vision? 1 0  Difficulty concentrating or making decisions? 0 0  Walking or climbing stairs? 0 0  Dressing or bathing? 0 0  Doing errands, shopping? 0 1    Patient Care Team: Lucious Groves, DO as PCP - General (Internal Medicine)  Indicate any recent Medical Services you may have received from other than [facility] providers in the past year (date may be approximate).     Assessment:   This is a routine wellness examination for Wayne Schmidt.  Hearing/Vision screen No results found.  Dietary issues and exercise activities discussed:     Goals Addressed   None   Depression Screen    08/24/2022   10:11 AM 05/02/2022    11:07 AM 01/10/2022   10:04 AM 09/20/2021   12:28 PM 06/14/2021   12:14 PM 02/15/2021   11:38 AM 10/06/2020   12:05 PM  PHQ 2/9 Scores  PHQ - 2 Score 0 0 0 0 0 0   PHQ- 9 Score     1       Information is confidential and restricted. Go to Review Flowsheets to unlock data.    Fall Risk    08/24/2022   10:11 AM 05/02/2022   11:06 AM 01/10/2022   10:04 AM 09/20/2021   11:08 AM 06/14/2021  10:28 AM  Fall Risk   Falls in the past year? 0 0 0 0 0  Number falls in past yr: 0 0 0 0 0  Injury with Fall? 0 0 0 0 0  Risk for fall due to : Impaired balance/gait  No Fall Risks    Follow up Falls evaluation completed;Falls prevention discussed Falls evaluation completed Falls evaluation completed;Falls prevention discussed Falls evaluation completed Falls evaluation completed    FALL RISK PREVENTION PERTAINING TO THE HOME:  Any stairs in or around the home? Yes  If so, are there any without handrails? Yes  Home free of loose throw rugs in walkways, pet beds, electrical cords, etc? No  Adequate lighting in your home to reduce risk of falls? Yes   ASSISTIVE DEVICES UTILIZED TO PREVENT FALLS:  Life alert? No  Use of a cane, walker or w/c? No  Grab bars in the bathroom? Yes  Shower chair or bench in shower? No  Elevated toilet seat or a handicapped toilet? No   TIMED UP AND GO:  Was the test performed? Yes .  Length of time to ambulate 10 feet: 8 sec.   Gait steady and fast without use of assistive device  Cognitive Function:        Immunizations Immunization History  Administered Date(s) Administered   Fluad Quad(high Dose 65+) 05/02/2022   Influenza Split 04/06/2011, 04/11/2012   Influenza Whole 05/28/2006, 06/11/2007, 04/08/2008, 03/24/2009, 05/02/2010   Influenza,inj,Quad PF,6+ Mos 04/14/2013, 04/01/2014, 04/08/2015, 04/11/2016, 03/20/2017, 02/26/2018, 04/15/2019, 03/16/2020, 06/14/2021   PFIZER(Purple Top)SARS-COV-2 Vaccination 11/28/2019, 12/19/2019   Pfizer Covid-19  Vaccine Bivalent Booster 54yr & up 10/08/2020, 02/07/2021, 11/22/2021   Pneumococcal Conjugate-13 06/22/2020   Pneumococcal Polysaccharide-23 05/04/2011, 08/07/2017   Td 09/20/2021   Tdap 05/04/2011    TDAP status: Up to date  Flu Vaccine status: Up to date  Pneumococcal vaccine status: Declined,  Education has been provided regarding the importance of this vaccine but patient still declined. Advised may receive this vaccine at local pharmacy or Health Dept. Aware to provide a copy of the vaccination record if obtained from local pharmacy or Health Dept. Verbalized acceptance and understanding.   Covid-19 vaccine status: Completed vaccines  Qualifies for Shingles Vaccine? Yes   Zostavax completed No   Shingrix Completed?: Yes  Screening Tests Health Maintenance  Topic Date Due   Zoster Vaccines- Shingrix (1 of 2) Never done   COVID-19 Vaccine (6 - 2023-24 season) 03/03/2022   Pneumonia Vaccine 68 Years old (3 of 3 - PPSV23 or PCV20) 08/07/2022   Lung Cancer Screening  08/25/2023 (Originally 05/30/2022)   FOOT EXAM  09/21/2022   LIPID PANEL  09/21/2022   HEMOGLOBIN A1C  11/22/2022   Diabetic kidney evaluation - eGFR measurement  01/11/2023   Diabetic kidney evaluation - Urine ACR  01/11/2023   OPHTHALMOLOGY EXAM  05/04/2023   Medicare Annual Wellness (AWV)  08/25/2023   COLONOSCOPY (Pts 45-460yrInsurance coverage will need to be confirmed)  11/16/2024   DTaP/Tdap/Td (3 - Td or Tdap) 09/21/2031   INFLUENZA VACCINE  Completed   Hepatitis C Screening  Completed   HPV VACCINES  Aged Out    Health Maintenance  Health Maintenance Due  Topic Date Due   Zoster Vaccines- Shingrix (1 of 2) Never done   COVID-19 Vaccine (6 - 2023-24 season) 03/03/2022   Pneumonia Vaccine 6565Years old (3 34f 3 - PPSV23 or PCV20) 08/07/2022    Colorectal cancer screening: Type of screening: Colonoscopy. Completed 11/17/2014. Repeat every 10  years  Lung Cancer Screening: (Low Dose CT Chest  recommended if Age 26-80 years, 30 pack-year currently smoking OR have quit w/in 15years.) does qualify.   Lung Cancer Screening Referral: patient not insteresed  Additional Screening:  Hepatitis C Screening: does qualify; Completed 04/122017  Vision Screening: Recommended annual ophthalmology exams for early detection of glaucoma and other disorders of the eye. Is the patient up to date with their annual eye exam?  Yes  Who is the provider or what is the name of the office in which the patient attends annual eye exams? Dr Prudencio Burly If pt is not established with a provider, would they like to be referred to a provider to establish care? No .   Dental Screening: Recommended annual dental exams for proper oral hygiene  Community Resource Referral / Chronic Care Management: CRR required this visit?  No   CCM required this visit?  No      Plan:     I have personally reviewed and noted the following in the patient's chart:   Medical and social history Use of alcohol, tobacco or illicit drugs  Current medications and supplements including opioid prescriptions. Patient is not currently taking opioid prescriptions. Functional ability and status Nutritional status Physical activity Advanced directives List of other physicians Hospitalizations, surgeries, and ER visits in previous 12 months Vitals Screenings to include cognitive, depression, and falls Referrals and appointments  In addition, I have reviewed and discussed with patient certain preventive protocols, quality metrics, and best practice recommendations. A written personalized care plan for preventive services as well as general preventive health recommendations were provided to patient.   Encounter for Medicare annual wellness exam  Healthcare maintenance  Essential hypertension Assessment & Plan: Historically well-controlled patient notes that he has not taken his medication yet today blood pressure is only mildly  elevated will not make any current changes.   Aortic atherosclerosis ([place]) Assessment & Plan: Aortic atherosclerosis was noted on his previous CTA he has not found to have any significant coronary artery stenosis.  He does remain on Crestor 20 mg daily.   Type 2 diabetes mellitus with other specified complication, without long-term current use of insulin ([place]) Assessment & Plan: Tolerating Jardiance well he does report some frequent urination we will plan to obtain a urinalysis however this likely is just the glucoseuria side effect of Jardiance.  Diabetes is well-controlled with A1c of 7%.  Orders: -     POCT glycosylated hemoglobin (Hb A1C)  Paranoid schizophrenia, subchronic condition (HCC) Assessment & Plan: Follows with Dr. Adele Schilder he is on chronic antipsychotic medication, and we are monitoring for the adverse metabolic properties associated chronic antipsycholic use   Frequent urination -     Urinalysis, Routine w reflex microscopic  TOBACCO ABUSE Assessment & Plan: He is an active smoker not interested at this time and cessation or lung cancer screening.   Other orders -     Fluticasone Propionate; Place 1 spray into both nostrils daily.  Dispense: 16 mL; Refill: [place], DO   08/24/2022   Nurse Notes:

## 2022-08-24 NOTE — Assessment & Plan Note (Signed)
Historically well-controlled patient notes that he has not taken his medication yet today blood pressure is only mildly elevated will not make any current changes.

## 2022-08-24 NOTE — Assessment & Plan Note (Signed)
He is an active smoker not interested at this time and cessation or lung cancer screening.

## 2022-08-24 NOTE — Assessment & Plan Note (Signed)
Aortic atherosclerosis was noted on his previous CTA he has not found to have any significant coronary artery stenosis.  He does remain on Crestor 20 mg daily.

## 2022-08-24 NOTE — Assessment & Plan Note (Addendum)
Tolerating Jardiance well he does report some frequent urination we will plan to obtain a urinalysis however this likely is just the glucoseuria side effect of Jardiance.  Diabetes is well-controlled with A1c of 7%.  ADDENDUM: UA showing glucouria without other abnormalities, called and discussed that is Wayne Schmidt frequency is likely side effect of jardiance. Offer to replace it with other medication (he does not like injectables and prefers low mg medications so I suggested trajenta) however he was hesitant so we decided to decrease dose of jardiance to '10mg'$  daily

## 2022-08-24 NOTE — Assessment & Plan Note (Signed)
Follows with Dr. Adele Schilder he is on chronic antipsychotic medication, and we are monitoring for the adverse metabolic properties associated chronic antipsycholic use

## 2022-08-25 LAB — URINALYSIS, ROUTINE W REFLEX MICROSCOPIC
Bilirubin, UA: NEGATIVE
Ketones, UA: NEGATIVE
Leukocytes,UA: NEGATIVE
Nitrite, UA: NEGATIVE
Protein,UA: NEGATIVE
RBC, UA: NEGATIVE
Specific Gravity, UA: >=1.03 — AB (ref 1.005–1.030)
Urobilinogen, Ur: 0.2 mg/dL (ref 0.2–1.0)
pH, UA: 6 (ref 5.0–7.5)

## 2022-08-25 MED ORDER — EMPAGLIFLOZIN 10 MG PO TABS: 10.0000 mg | ORAL_TABLET | Freq: Every day | ORAL | 11 refills | Status: DC

## 2022-08-25 NOTE — Addendum Note (Signed)
Addended by: Lucious Groves on: 08/25/2022 01:31 PM   Modules accepted: Orders

## 2022-08-29 ENCOUNTER — Ambulatory Visit (HOSPITAL_BASED_OUTPATIENT_CLINIC_OR_DEPARTMENT_OTHER): Payer: 59 | Admitting: *Deleted

## 2022-08-29 VITALS — BP 123/76 | HR 78 | Resp 18 | Ht 73.0 in | Wt 210.6 lb

## 2022-08-29 DIAGNOSIS — F200 Paranoid schizophrenia: Secondary | ICD-10-CM | POA: Diagnosis not present

## 2022-08-29 NOTE — Patient Instructions (Addendum)
Pt presents to clinic today for due Prolixin 25 mg injection. Pt moving slow today stating that he's been on his couch for a week due to having bedbugs in his mattress. Pt said that he was feeling frustrated and "probably said something I shouldn't have" yesterday involving a medical provider. Pt would not elaborate. Pt baseline hyper religious with loose associations. Pt does brighten in conversation. Pt denies Hi, SI, evasive when asked about AVH. Injection prepared as ordered and given in [facility] without complaint. Pt to return in 2 weeks for next due injection.

## 2022-09-12 ENCOUNTER — Ambulatory Visit (HOSPITAL_BASED_OUTPATIENT_CLINIC_OR_DEPARTMENT_OTHER): Payer: 59 | Admitting: *Deleted

## 2022-09-12 VITALS — BP 136/73 | HR 85 | Resp 18 | Ht 73.0 in | Wt 214.4 lb

## 2022-09-12 DIAGNOSIS — F200 Paranoid schizophrenia: Secondary | ICD-10-CM | POA: Diagnosis not present

## 2022-09-12 NOTE — Patient Instructions (Signed)
Pt presents to clinic today for due Prolixin 25 mg injection. Pt affect and mood appropriate to situation.  Pt  has no c/o today. Evasive when asked if he is experiencing AVH. No SI or HI. Decreased religiosity today but thoughts still disorganized some. Injection prepared and given as ordered in [place] without complaint. Pt to return in 2 weeks for next due injection.

## 2022-09-26 ENCOUNTER — Encounter (HOSPITAL_COMMUNITY): Payer: Self-pay

## 2022-09-26 ENCOUNTER — Ambulatory Visit (HOSPITAL_BASED_OUTPATIENT_CLINIC_OR_DEPARTMENT_OTHER): Payer: 59 | Admitting: *Deleted

## 2022-09-26 VITALS — BP 134/82 | HR 83 | Ht 73.0 in | Wt 213.0 lb

## 2022-09-26 DIAGNOSIS — F200 Paranoid schizophrenia: Secondary | ICD-10-CM | POA: Diagnosis not present

## 2022-09-26 NOTE — Patient Instructions (Signed)
Patient arrived for Q 2 week injection :fluPHENAZine decanoate (PROLIXIN) 25 MG/ML   Patient tolerated injection well in RUQ- Patient Pleasant &  conversing well. NO COMPLAINTS--  NO AH/VH  NOR SI/HI

## 2022-09-26 NOTE — Progress Notes (Signed)
Patient arrived for Q 2 week injection :fluPHENAZine decanoate (PROLIXIN) 25 MG/ML   Patient tolerated injection well in RUQ- Patient Pleasant &  conversing well. NO COMPLAINTS--  NO AH/VH  NOR SI/HI

## 2022-10-10 ENCOUNTER — Ambulatory Visit (HOSPITAL_BASED_OUTPATIENT_CLINIC_OR_DEPARTMENT_OTHER): Payer: 59 | Admitting: *Deleted

## 2022-10-10 ENCOUNTER — Encounter (HOSPITAL_COMMUNITY): Payer: Self-pay

## 2022-10-10 VITALS — BP 137/79 | HR 86 | Ht 73.0 in | Wt 213.0 lb

## 2022-10-10 DIAGNOSIS — F200 Paranoid schizophrenia: Secondary | ICD-10-CM | POA: Diagnosis not present
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
## 2022-10-24 ENCOUNTER — Encounter (HOSPITAL_COMMUNITY): Payer: Self-pay

## 2022-10-24 ENCOUNTER — Ambulatory Visit (HOSPITAL_BASED_OUTPATIENT_CLINIC_OR_DEPARTMENT_OTHER): Payer: 59 | Admitting: *Deleted

## 2022-10-24 VITALS — BP 118/80 | HR 96 | Ht 73.0 in | Wt 212.0 lb

## 2022-10-24 DIAGNOSIS — F200 Paranoid schizophrenia: Secondary | ICD-10-CM | POA: Diagnosis not present

## 2022-10-24 NOTE — Progress Notes (Signed)
PATIENT ARRIVED FOR INJECTION Q 14 DAYS fluPHENAZine decanoate (PROLIXIN) injection 25 mg  TOLERATED INJECTION WELL IN RUOQ. NO COMPLAINTS EVERYTHING IS GOING WELL. NO AH/VH   NOR SI/HI

## 2022-10-24 NOTE — Patient Instructions (Signed)
PATIENT ARRIVED FOR INJECTION Q 14 DAYS fluPHENAZine decanoate (PROLIXIN) injection 25 mg  TOLERATED INJECTION WELL IN [facility]OQ. NO COMPLAINTS EVERYTHING IS GOING WELL. NO AH/VH   NOR SI/HI 

## 2022-11-07 ENCOUNTER — Ambulatory Visit (HOSPITAL_BASED_OUTPATIENT_CLINIC_OR_DEPARTMENT_OTHER): Payer: 59 | Admitting: *Deleted

## 2022-11-07 VITALS — BP 129/83 | HR 77 | Resp 20 | Ht 73.0 in | Wt 215.0 lb

## 2022-11-07 DIAGNOSIS — F200 Paranoid schizophrenia: Secondary | ICD-10-CM | POA: Diagnosis not present

## 2022-11-07 NOTE — Patient Instructions (Signed)
Pt in office today for due Prolixin 25 mg injection. Pt is bright and cooperative on approach. Pt mood labile and speech loud. Baseline hyper religiosity present. Pt requiring some redirection to stay on task (getting injection). Pt says he's just trying to clean his house and get a clean bill of health. Pt says he's been compliant with all meds  including BP med. VSS today.  Injection prepared and given as ordered in LUOQ without any complaints of pain. Pt has been summoned to jury duty, excuse offered, pt will bring the form for [place] to complete, in 2 weeks when he returns for his next due injection.

## 2022-11-08 ENCOUNTER — Telehealth (HOSPITAL_BASED_OUTPATIENT_CLINIC_OR_DEPARTMENT_OTHER): Payer: 59 | Admitting: Psychiatry

## 2022-11-08 ENCOUNTER — Encounter (HOSPITAL_COMMUNITY): Payer: Self-pay | Admitting: Psychiatry

## 2022-11-08 VITALS — Wt 215.0 lb

## 2022-11-08 DIAGNOSIS — F200 Paranoid schizophrenia: Secondary | ICD-10-CM

## 2022-11-08 DIAGNOSIS — G2401 Drug induced subacute dyskinesia: Secondary | ICD-10-CM

## 2022-11-08 MED ORDER — FLUPHENAZINE DECANOATE 25 MG/ML IJ SOLN: 25.0000 mg | INTRAMUSCULAR | 2 refills | Status: DC

## 2022-11-08 NOTE — Progress Notes (Signed)
[facility] MD Virtual Progress Note   Patient Location: Home  Provider Location: Home Office  I connect with patient by telephone and verified that I am speaking with correct person by using two identifiers. I discussed the limitations of evaluation and management by telemedicine and the availability of in person appointments. I also discussed with the patient that there may be a patient responsible charge related to this service. The patient expressed understanding and agreed to proceed.  Wayne Schmidt [id number] 67 y.o.  11/08/2022 10:30 AM  History of Present Illness:  Patient is evaluated by phone session.  He is pleasant on conversation but remains circumstantial and religiously preoccupied.  He is getting Prolixin injection every 2 weeks and he feels it is helping sleep.  He has no tremor or shakes or any EPS.  Denies any hallucination, paranoia or any suicidal thoughts.  He admitted some time not consistent on the time of injection but most of the time he is able to get the injection few days later.  He does talk to his daughter on a regular basis.  He admitted missing his deceased mother on mother's day.  Denies drinking or using any illegal substances.  His appetite is okay.  His weight is stable.  Denies any aggression, violence, impulsive behavior.  He like to keep the current medication.  He has tremors but does not want to take any medication.  Past Psychiatric History: H/O schizophrenia with multiple inpatient treatment. H/O of hallucination, paranoia and suicidal attempt by overdose.  Last inpatient at [facility]. Took Haldol and Thorazine but had side effects.  On Prolixin injection for many years.  Tried hydroxyzine and Ingreeza but could not tolerate.    Outpatient Encounter Medications as of 11/08/2022  Medication Sig   doxazosin (CARDURA) 2 MG tablet TAKE (1) TABLET BY MOUTH ONCE DAILY.   empagliflozin (JARDIANCE) 10 MG TABS tablet Take 1 tablet (10 mg total) by mouth  daily before breakfast.   fluPHENAZine decanoate (PROLIXIN) 25 MG/ML injection Inject 1 mL (25 mg total) into the muscle every 14 (fourteen) days.   fluticasone (FLONASE) 50 MCG/ACT nasal spray Place 1 spray into both nostrils daily.   hydroxypropyl methylcellulose / hypromellose (ISOPTO TEARS / GONIOVISC) 2.5 % ophthalmic solution Place 2 drops into both eyes daily as needed for dry eyes.   losartan (COZAAR) 100 MG tablet TAKE (1) TABLET BY MOUTH ONCE DAILY.   rosuvastatin (CRESTOR) 20 MG tablet TAKE (1) TABLET BY MOUTH ONCE DAILY.   VOLTAREN 1 % GEL Apply 2 g topically 4 (four) times daily.   Facility-Administered Encounter Medications as of 11/08/2022  Medication   fluPHENAZine decanoate (PROLIXIN) injection 25 mg    Recent Results (from the past 2160 hour(s))  Glucose, capillary     Status: Abnormal   Collection Time: 08/24/22 10:20 AM  Result Value Ref Range   Glucose-Capillary 134 (H) 70 - 99 mg/dL    Comment: Glucose reference range applies only to samples taken after fasting for at least 8 hours.  POC Hbg A1C     Status: Abnormal   Collection Time: 08/24/22 10:28 AM  Result Value Ref Range   Hemoglobin A1C 7.0 (A) 4.0 - 5.6 %   HbA1c POC (<> result, manual entry)     HbA1c, POC (prediabetic range)     HbA1c, POC (controlled diabetic range)    Urinalysis, Reflex Microscopic     Status: Abnormal   Collection Time: 08/24/22 11:05 AM  Result Value Ref Range  Specific Gravity, UA      >=1.030 (A) 1.005 - 1.030   pH, UA 6.0 5.0 - 7.5   Color, UA Yellow Yellow   Appearance Ur Clear Clear   Leukocytes,UA Negative Negative   Protein,UA Negative Negative/Trace   Glucose, UA 3+ (A) Negative   Ketones, UA Negative Negative   RBC, UA Negative Negative   Bilirubin, UA Negative Negative   Urobilinogen, Ur 0.2 0.2 - 1.0 mg/dL   Nitrite, UA Negative Negative   Microscopic Examination Comment     Comment: Microscopic not indicated and not performed.     Psychiatric Specialty  Exam: Physical Exam  Review of Systems  Weight 215 lb (97.5 kg).There is no height or weight on file to calculate BMI.  General Appearance: NA  Eye Contact:  NA  Speech:  Slow  Volume:  Decreased  Mood:  Euthymic  Affect:  NA  Thought Process:  Descriptions of Associations: Circumstantial  Orientation:  Full (Time, Place, and Person)  Thought Content:  Rumination and religiously preoccupied.   Suicidal Thoughts:  No  Homicidal Thoughts:  No  Memory:  Immediate;   Fair Recent;   Fair Remote;   Fair  Judgement:  Fair  Insight:  Shallow  Psychomotor Activity:  Decreased and Tremor  Concentration:  Concentration: Fair and Attention Span: Fair  Recall:  [employer] of Knowledge:  Fair  Language:  Fair  Akathisia:  No  Handed:  Right  AIMS (if indicated):     Assets:  Communication Skills Desire for Improvement Housing Transportation  ADL's:  Intact  Cognition:  WNL  Sleep:  ok     Assessment/Plan: Paranoid schizophrenia, subchronic condition (HCC) - Plan: fluPHENAZine decanoate (PROLIXIN) 25 MG/ML injection  Tardive dyskinesia - Plan: fluPHENAZine decanoate (PROLIXIN) 25 MG/ML injection  Patient is stable on medication.  He has chronic symptoms but manageable.  Like to keep the Prolixin 25 mg intramuscular every 2 weeks.  He does not want to take any medicine for tremors.  Recommend to call us back if has any question or any concern.  Follow-up in 3 months.   Follow Up Instructions:     I discussed the assessment and treatment plan with the patient. The patient was provided an opportunity to ask questions and all were answered. The patient agreed with the plan and demonstrated an understanding of the instructions.   The patient was advised to call back or seek an in-person evaluation if the symptoms worsen or if the condition fails to improve as anticipated.    Collaboration of Care: Other provider involved in patient's care AEB notes are available in epic to  review.  Patient/Guardian was advised Release of Information must be obtained prior to any record release in order to collaborate their care with an outside provider. Patient/Guardian was advised if they have not already done so to contact the registration department to sign all necessary forms in order for [place] to release information regarding their care.   Consent: Patient/Guardian gives verbal consent for treatment and assignment of benefits for services provided during this visit. Patient/Guardian expressed understanding and agreed to proceed.     I provided 17 minutes of non face to face time during this encounter.  Note: This document was prepared by [employer] voice dictation technology and any errors that results from this process are unintentional.    Cleotis Nipper, MD 11/08/2022

## 2022-11-21 ENCOUNTER — Telehealth (HOSPITAL_BASED_OUTPATIENT_CLINIC_OR_DEPARTMENT_OTHER): Payer: 59 | Admitting: *Deleted

## 2022-11-21 ENCOUNTER — Encounter (HOSPITAL_COMMUNITY): Payer: Self-pay

## 2022-11-21 VITALS — BP 158/90 | HR 94 | Ht 73.0 in | Wt 215.0 lb

## 2022-11-21 DIAGNOSIS — F200 Paranoid schizophrenia: Secondary | ICD-10-CM

## 2022-11-21 NOTE — Progress Notes (Signed)
PATIENT ARRIVED FOR HIS INJECTION fluPHENAZine decanoate (PROLIXIN) injection 25 mg .WAS ERY EXCITE TO SEE REGAN HAS RETURNED & THAT SHAWN WA IN THE OFFICE TODAY. HE WAS SO HAPPY .   PATIENT TOLERATED INJECTION WELL IN RIGHT UPPER OUTER QUADRANT.  

## 2022-11-21 NOTE — Patient Instructions (Signed)
PATIENT ARRIVED FOR HIS INJECTION fluPHENAZine decanoate (PROLIXIN) injection 25 mg .WAS ERY EXCITE TO SEE REGAN HAS RETURNED & THAT SHAWN WA IN THE OFFICE TODAY. HE WAS SO HAPPY .   PATIENT TOLERATED INJECTION WELL IN RIGHT UPPER OUTER QUADRANT.

## 2022-11-23 ENCOUNTER — Encounter: Payer: 59 | Admitting: Internal Medicine

## 2022-11-23 ENCOUNTER — Other Ambulatory Visit: Payer: Self-pay

## 2022-11-23 ENCOUNTER — Encounter: Payer: Self-pay | Admitting: Internal Medicine

## 2022-11-23 ENCOUNTER — Ambulatory Visit (INDEPENDENT_AMBULATORY_CARE_PROVIDER_SITE_OTHER): Payer: 59 | Admitting: Internal Medicine

## 2022-11-23 VITALS — BP 118/78 | HR 88 | Temp 97.7°F | Ht 73.0 in | Wt 215.2 lb

## 2022-11-23 DIAGNOSIS — Z7984 Long term (current) use of oral hypoglycemic drugs: Secondary | ICD-10-CM | POA: Diagnosis not present

## 2022-11-23 DIAGNOSIS — N481 Balanitis: Secondary | ICD-10-CM

## 2022-11-23 DIAGNOSIS — B351 Tinea unguium: Secondary | ICD-10-CM

## 2022-11-23 DIAGNOSIS — E1169 Type 2 diabetes mellitus with other specified complication: Secondary | ICD-10-CM | POA: Diagnosis not present

## 2022-11-23 LAB — POCT GLYCOSYLATED HEMOGLOBIN (HGB A1C): Hemoglobin A1C: 7.2 % — AB (ref 4.0–5.6)

## 2022-11-23 LAB — GLUCOSE, CAPILLARY: Glucose-Capillary: 125 mg/dL — ABNORMAL HIGH (ref 70–99)

## 2022-11-23 MED ORDER — CLOTRIMAZOLE 1 % EX CREA: 1.0000 | TOPICAL_CREAM | Freq: Two times a day (BID) | CUTANEOUS | 0 refills | Status: DC

## 2022-11-23 MED ORDER — METFORMIN HCL ER 500 MG PO TB24: 500.0000 mg | ORAL_TABLET | Freq: Every day | ORAL | 11 refills | Status: DC

## 2022-11-23 NOTE — Patient Instructions (Addendum)
I want you to pick up a cream called clotrimazole 1% (Lotrimin) and apply it to you penis twice a day for at least a week and let me know if that does not solve the issue.

## 2022-11-23 NOTE — Progress Notes (Signed)
Established Patient Office Visit  Subjective   Patient ID: Wayne Schmidt, male    DOB: 02/13/1956  Age: 67 y.o. MRN: [id number]  Chief Complaint  Patient presents with   Follow-up   Penis problem   Chest Pain    States he sees a cardiologist.   Cas is here today to follow-up his diabetes as well as addressed a few other chronic conditions.  He told my nurse intake that he was having some chest pains.  I clarified with him that he is not having any active chest pains but that is why he sees his cardiologist and he just wanted to make sure that we were aware he had an upcoming appointment to see the cardiologist.  At our last visit he is experiencing some frequent urination with his bothering him he does not seem to recall that issue but is bothered by some irritation of the shaft of the penis.  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  He does have an elevated PSA and is following with urology.     Objective:     BP 118/78 (BP Location: Right Arm, Patient Position: Sitting, Cuff Size: Normal)   Pulse 88   Temp 97.7 F (36.5 C) (Oral)   Ht 6\' 1"  (1.854 m)   Wt 215 lb 3.2 oz (97.6 kg)   SpO2 100% Comment: RA  BMI 28.39 kg/m  BP Readings from Last 3 Encounters:  11/23/22 118/78  08/24/22 (!) 142/80  05/02/22 117/72   Wt Readings from Last 3 Encounters:  11/23/22 215 lb 3.2 oz (97.6 kg)  05/02/22 210 lb 8 oz (95.5 kg)  01/10/22 218 lb 1.6 oz (98.9 kg)      Physical Exam Constitutional:      Appearance: He is well-developed.  Cardiovascular:     Heart sounds: Normal heart sounds.  Pulmonary:     Effort: Pulmonary effort is normal.     Breath sounds: Normal breath sounds.  Genitourinary:    Penis: Erythema and lesions (very small at glans) present.      Testes: Normal.  Neurological:     Mental Status: He is alert.  Psychiatric:     Comments:  Distracted, trouble keeping focused      Results for orders placed or performed in visit on 11/23/22  Glucose, capillary  Result Value Ref Range   Glucose-Capillary 125 (H) 70 - 99 mg/dL  POC Hbg W0J  Result Value Ref Range   Hemoglobin A1C 7.2 (A) 4.0 - 5.6 %   HbA1c POC (<> result, manual entry)     HbA1c, POC (prediabetic range)     HbA1c, POC (controlled diabetic range)      Last hemoglobin A1c Lab Results  Component Value Date   HGBA1C 7.2 (A) 11/23/2022      The 10-year ASCVD risk score (Arnett DK, et al., 2019) is: 37.9%    Assessment & Plan:   Problem List Items Addressed This Visit       Endocrine   Type 2 diabetes mellitus with other specified complication (HCC) - Primary    A1c has worsened after cutting back on the Jardiance.  On review of his chart he was unable to tolerate metformin in the past this appears to be the instant release formulation many years ago.  I discussed with him a retrial of just 500 mg extended release metformin as  I think this will have a better side effect profile.  He will continue Jardiance 10 mg daily for now.      Relevant Medications   metFORMIN (GLUCOPHAGE-XR) 500 MG 24 hr tablet   Other Relevant Orders   POC Hbg A1C (Completed)   Ambulatory referral to Podiatry     Musculoskeletal and Integument   ONYCHOMYCOSIS, TOENAILS    He has thick discolored toenails and having trouble trimming them.  I also appreciated a slight skin lesion between the fourth and fifth toes bilaterally I will send him over to podiatry for evaluation.      Relevant Medications   clotrimazole (LOTRIMIN) 1 % cream   Other Relevant Orders   Ambulatory referral to Podiatry     Genitourinary   Balanitis    Appears to have a component of balanitis likely due to his glucosuria in the SGLT2.  Will have him try clotrimazole cream for 1 week and see if this clears he notes he is seeing a urologist for follow-up next month.       Return in about 3  months (around 02/23/2023).    Gust Rung, DO

## 2022-11-24 DIAGNOSIS — N481 Balanitis: Secondary | ICD-10-CM | POA: Insufficient documentation

## 2022-11-24 NOTE — Assessment & Plan Note (Signed)
A1c has worsened after cutting back on the Jardiance.  On review of his chart he was unable to tolerate metformin in the past this appears to be the instant release formulation many years ago.  I discussed with him a retrial of just 500 mg extended release metformin as I think this will have a better side effect profile.  He will continue Jardiance 10 mg daily for now.

## 2022-11-24 NOTE — Assessment & Plan Note (Signed)
He has thick discolored toenails and having trouble trimming them.  I also appreciated a slight skin lesion between the fourth and fifth toes bilaterally I will send him over to podiatry for evaluation.

## 2022-11-24 NOTE — Assessment & Plan Note (Signed)
Appears to have a component of balanitis likely due to his glucosuria in the SGLT2.  Will have him try clotrimazole cream for 1 week and see if this clears he notes he is seeing a urologist for follow-up next month.

## 2022-12-01 ENCOUNTER — Emergency Department (HOSPITAL_COMMUNITY)
Admission: EM | Admit: 2022-12-01 | Discharge: 2022-12-01 | Disposition: A | Discharge status: Home / Self Care | Payer: 59 | Attending: Emergency Medicine | Admitting: Emergency Medicine

## 2022-12-01 ENCOUNTER — Other Ambulatory Visit: Payer: Self-pay

## 2022-12-01 ENCOUNTER — Encounter (HOSPITAL_COMMUNITY): Payer: Self-pay

## 2022-12-01 DIAGNOSIS — F17210 Nicotine dependence, cigarettes, uncomplicated: Secondary | ICD-10-CM | POA: Insufficient documentation

## 2022-12-01 DIAGNOSIS — R339 Retention of urine, unspecified: Secondary | ICD-10-CM

## 2022-12-01 DIAGNOSIS — R109 Unspecified abdominal pain: Secondary | ICD-10-CM | POA: Diagnosis not present

## 2022-12-01 DIAGNOSIS — Z7984 Long term (current) use of oral hypoglycemic drugs: Secondary | ICD-10-CM | POA: Insufficient documentation

## 2022-12-01 DIAGNOSIS — I10 Essential (primary) hypertension: Secondary | ICD-10-CM | POA: Diagnosis not present

## 2022-12-01 DIAGNOSIS — E119 Type 2 diabetes mellitus without complications: Secondary | ICD-10-CM | POA: Diagnosis not present

## 2022-12-01 DIAGNOSIS — Z743 Need for continuous supervision: Secondary | ICD-10-CM | POA: Diagnosis not present

## 2022-12-01 DIAGNOSIS — Z79899 Other long term (current) drug therapy: Secondary | ICD-10-CM | POA: Insufficient documentation

## 2022-12-01 DIAGNOSIS — R6889 Other general symptoms and signs: Secondary | ICD-10-CM | POA: Diagnosis not present

## 2022-12-01 LAB — URINALYSIS, W/ REFLEX TO CULTURE (INFECTION SUSPECTED)
Bilirubin Urine: NEGATIVE
Glucose, UA: >=500 mg/dL — AB
Ketones, ur: NEGATIVE mg/dL
Nitrite: NEGATIVE
Protein, ur: NEGATIVE mg/dL
Specific Gravity, Urine: 1.008 (ref 1.005–1.030)
pH: 7 (ref 5.0–8.0)

## 2022-12-01 LAB — COMPREHENSIVE METABOLIC PANEL
ALT: 21 U/L (ref 0–44)
AST: 28 U/L (ref 15–41)
Albumin: 4.1 g/dL (ref 3.5–5.0)
Alkaline Phosphatase: 67 U/L (ref 38–126)
Anion gap: 10 (ref 5–15)
BUN: 17 mg/dL (ref 8–23)
CO2: 24 mmol/L (ref 22–32)
Calcium: 9 mg/dL (ref 8.9–10.3)
Chloride: 102 mmol/L (ref 98–111)
Creatinine, Ser: 1.44 mg/dL — ABNORMAL HIGH (ref 0.61–1.24)
GFR, Estimated: 53 mL/min — ABNORMAL LOW (ref >60)
Glucose, Bld: 137 mg/dL — ABNORMAL HIGH (ref 70–99)
Potassium: 3.8 mmol/L (ref 3.5–5.1)
Sodium: 136 mmol/L (ref 135–145)
Total Bilirubin: 0.6 mg/dL (ref 0.3–1.2)
Total Protein: 7.1 g/dL (ref 6.5–8.1)

## 2022-12-01 LAB — CBC WITH DIFFERENTIAL/PLATELET
Abs Immature Granulocytes: 0.03 10*3/uL (ref 0.00–0.07)
Basophils Absolute: 0 10*3/uL (ref 0.0–0.1)
Basophils Relative: 0 %
Eosinophils Absolute: 0 10*3/uL (ref 0.0–0.5)
Eosinophils Relative: 0 %
HCT: 48.9 % (ref 39.0–52.0)
Hemoglobin: 15.7 g/dL (ref 13.0–17.0)
Immature Granulocytes: 0 %
Lymphocytes Relative: 9 %
Lymphs Abs: 0.9 10*3/uL (ref 0.7–4.0)
MCH: 24.9 pg — ABNORMAL LOW (ref 26.0–34.0)
MCHC: 32.1 g/dL (ref 30.0–36.0)
MCV: 77.5 fL — ABNORMAL LOW (ref 80.0–100.0)
Monocytes Absolute: 0.9 10*3/uL (ref 0.1–1.0)
Monocytes Relative: 9 %
Neutro Abs: 8.1 10*3/uL — ABNORMAL HIGH (ref 1.7–7.7)
Neutrophils Relative %: 82 %
Platelets: 128 10*3/uL — ABNORMAL LOW (ref 150–400)
RBC: 6.31 MIL/uL — ABNORMAL HIGH (ref 4.22–5.81)
RDW: 17.3 % — ABNORMAL HIGH (ref 11.5–15.5)
WBC: 10 10*3/uL (ref 4.0–10.5)
nRBC: 0 % (ref 0.0–0.2)

## 2022-12-01 NOTE — Discharge Instructions (Signed)
You were evaluated for your abdominal pain. Your bladder was blocked by your prostate and we placed a catheter. Your lab tests showed mild kidney injury, please follow up with your primary doctor in around 1 week for a re-check. Be sure to drink plenty of fluids.   Please be sure to take your doxazosin as this will help relieve the blockage. Please follow up with urology in around 1 week so they can check to see if the catheter can be removed yet.   Please return to the emergency department if you develop any fevers, chills, vomiting, chest pain, lightheadedness or dizziness, recurrent pain, back or flank pain, or any other new symptoms.

## 2022-12-01 NOTE — ED Triage Notes (Signed)
Pt states he hasn't been able to fully void since last night. Pt has a hx of urinary retention and foley placement. Pt has an enlarged prostate and sees a urologist. Last visit to the urologist was 6 months ago.

## 2022-12-01 NOTE — ED Notes (Signed)
Placed 16Fr foley with no difficulty. Pt states he has immediate relief from his abd pain.

## 2022-12-01 NOTE — ED Provider Notes (Signed)
[place] EMERGENCY DEPARTMENT AT [facility] Provider Note  CSN: [id number] Arrival date & time: 12/01/22 0945  Chief Complaint(s) Abdominal Pain (Urinary retention)  HPI Timthy Nordmann is a 67 y.o. male with history of BPH, diabetes, schizophrenia presenting to the emergency department with lower abdominal pain and difficulty urinating.  Reports since last night has not been able to urinate.  Prior to this, he reports that he was urinating normally without dysuria.  No flank pain, fevers or chills, nausea or vomiting, diarrhea.  Reports that this is the same as when he needed a catheter placed in the past.  He reports he stopped taking his Flomax because he thought it made him pee a lot   Past Medical History Past Medical History:  Diagnosis Date   Blood creatinine increased compared with prior measurement 07/29/2019   Diabetes mellitus without complication (HCC)    High risk sexual behavior 10/2007   Hypertension    Microcytosis    Schizophrenia (HCC)    Substance abuse (HCC)    tobacco use   UTI (urinary tract infection) 08/24/2016   Admitted 08/23/16-08/26/16 with several day history of UTI symptoms + positive UA. Culture resulted pan-sensitive E.coli. Was treated with IV Ceftriaxone and d/c home with Bactrim to complete 10 day course.    Patient Active Problem List   Diagnosis Date Noted   Balanitis 11/24/2022   Aortic atherosclerosis (HCC) 01/11/2022   Elevated PSA 01/10/2022   Right shoulder pain 11/11/2019   Plantar fasciitis 06/04/2019   Healthcare maintenance 04/01/2014   Lumbar radiculopathy 08/04/2013   ONYCHOMYCOSIS, TOENAILS 07/07/2010   MICROCYTOSIS 02/24/2010   BPH associated with nocturia 03/24/2009   Chest pain 11/22/2006   Paranoid schizophrenia, subchronic condition (HCC) 10/11/2006   Hyperlipidemia 08/28/2006   Type 2 diabetes mellitus with other specified complication (HCC) 04/27/2006   TOBACCO ABUSE 04/27/2006   Essential hypertension  04/27/2006   Home Medication(s) Prior to Admission medications   Medication Sig Start Date End Date Taking? Authorizing Provider  clotrimazole (LOTRIMIN) 1 % cream Apply 1 Application topically 2 (two) times daily. 11/23/22   Gust Rung, DO  doxazosin (CARDURA) 2 MG tablet TAKE (1) TABLET BY MOUTH ONCE DAILY. 05/19/22   Earl Lagos, MD  empagliflozin (JARDIANCE) 10 MG TABS tablet Take 1 tablet (10 mg total) by mouth daily before breakfast. 08/25/22   Gust Rung, DO  fluPHENAZine decanoate (PROLIXIN) 25 MG/ML injection Inject 1 mL (25 mg total) into the muscle every 14 (fourteen) days. 11/08/22   Arfeen, Phillips Grout, MD  fluticasone (FLONASE) 50 MCG/ACT nasal spray Place 1 spray into both nostrils daily. 08/24/22   Gust Rung, DO  hydroxypropyl methylcellulose / hypromellose (ISOPTO TEARS / GONIOVISC) 2.5 % ophthalmic solution Place 2 drops into both eyes daily as needed for dry eyes.    [provider]  losartan (COZAAR) 100 MG tablet TAKE (1) TABLET BY MOUTH ONCE DAILY. 05/19/22   Earl Lagos, MD  metFORMIN (GLUCOPHAGE-XR) 500 MG 24 hr tablet Take 1 tablet (500 mg total) by mouth daily with breakfast. 11/23/22   Gust Rung, DO  rosuvastatin (CRESTOR) 20 MG tablet TAKE (1) TABLET BY MOUTH ONCE DAILY. 05/19/22   Earl Lagos, MD  VOLTAREN 1 % GEL Apply 2 g topically 4 (four) times daily. 06/04/19   Kirt Boys, MD  Past Surgical History Past Surgical History:  Procedure Laterality Date   LAMINECTOMY     by Dr Elesa Hacker   Family History Family History  Problem Relation Age of Onset   Diabetes Mother    Hypertension Mother    Lupus Sister    Sickle cell anemia Brother    Drug abuse Brother     Social History Social History   Tobacco Use   Smoking status: Every Day    Packs/day: 1.00    Years: 38.00    Additional pack  years: 0.00    Total pack years: 38.00    Types: Cigarettes   Smokeless tobacco: Never  Vaping Use   Vaping Use: Never used  Substance Use Topics   Alcohol use: Yes    Comment: Seldom.   Drug use: No    Comment: h/o remote cocaine use-denies present use   Allergies Ace inhibitors, Atorvastatin, Chlorpromazine hcl, and Simvastatin  Review of Systems Review of Systems  All other systems reviewed and are negative.   Physical Exam Vital Signs  I have reviewed the triage vital signs BP 138/85   Pulse 69   Temp 97.8 F (36.6 C) (Oral)   Resp 19   Ht 6\' 1"  (1.854 m)   Wt 97.5 kg   SpO2 99%   BMI 28.37 kg/m  Physical Exam Vitals and nursing note reviewed.  Constitutional:      General: He is not in acute distress.    Appearance: Normal appearance.  HENT:     Mouth/Throat:     Mouth: Mucous membranes are moist.  Eyes:     Conjunctiva/sclera: Conjunctivae normal.  Cardiovascular:     Rate and Rhythm: Normal rate and regular rhythm.  Pulmonary:     Effort: Pulmonary effort is normal. No respiratory distress.     Breath sounds: Normal breath sounds.  Abdominal:     General: Abdomen is flat.     Palpations: Abdomen is soft.     Tenderness: There is abdominal tenderness in the suprapubic area.  Musculoskeletal:     Right lower leg: No edema.     Left lower leg: No edema.  Skin:    General: Skin is warm and dry.     Capillary Refill: Capillary refill takes less than 2 seconds.  Neurological:     Mental Status: He is alert and oriented to person, place, and time. Mental status is at baseline.  Psychiatric:        Mood and Affect: Mood normal.        Behavior: Behavior normal.     ED Results and Treatments Labs (all labs ordered are listed, but only abnormal results are displayed) Labs Reviewed  COMPREHENSIVE METABOLIC PANEL - Abnormal; Notable for the following components:      Result Value   Glucose, Bld 137 (*)    Creatinine, Ser 1.44 (*)    GFR, Estimated  53 (*)    All other components within normal limits  CBC WITH DIFFERENTIAL/PLATELET - Abnormal; Notable for the following components:   RBC 6.31 (*)    MCV 77.5 (*)    MCH 24.9 (*)    RDW 17.3 (*)    Platelets 128 (*)    Neutro Abs 8.1 (*)    All other components within normal limits  URINALYSIS, W/ REFLEX TO CULTURE (INFECTION SUSPECTED) - Abnormal; Notable for the following components:   Color, Urine STRAW (*)    Glucose, UA >=500 (*)    Hgb urine dipstick  MODERATE (*)    Leukocytes,Ua MODERATE (*)    Bacteria, UA RARE (*)    All other components within normal limits  URINE CULTURE                                                                                                                          Radiology No results found.  Pertinent labs & imaging results that were available during my care of the patient were reviewed by me and considered in my medical decision making (see MDM for details).  Medications Ordered in ED Medications - No data to display                                                                                                                                   Procedures Procedures  (including critical care time)  Medical Decision Making / ED Course   MDM:  67 year old presenting with lower abdominal pain and difficulty urinating.  PVR greater than 800 cc of urine in the bladder.  Will place Foley catheter and reassess.  Will check urine and basic labs given age.  Patient has had very similar symptoms in the past.  Very low concern for other intra-abdominal process such as diverticulitis, perforation, volvulus, obstruction, appendicitis.  Will reassess.  Clinical Course as of 12/01/22 1254  Fri Dec 01, 2022  1217 Patient's symptoms resolved after catheter placement. Urinalysis indeterminate for infection with only rare bacteria and more RBC than WBC. Culture sent. Will defer antibiotics pending culture.  [WS]  1237 Creatinine(!): 1.44 Mild AKI on  labs likely from obstruction. Discussed with patient. Encouraged PO fluid intake and close PMD follow up for lab re-check as well as urology follow up for catheter check.  [WS]  1253 Will discharge patient to home. All questions answered. Patient comfortable with plan of discharge. Return precautions discussed with patient and specified on the after visit summary.  [WS]    Clinical Course User Index [WS] Lonell Grandchild, MD     Additional history obtained: -External records from outside source obtained and reviewed including: Chart review including previous notes, labs, imaging, consultation notes including prior PMD notes   Lab Tests: -I ordered, reviewed, and interpreted labs.   The pertinent results include:   Labs Reviewed  COMPREHENSIVE METABOLIC PANEL - Abnormal; Notable for the following components:      Result Value   Glucose, Bld 137 (*)  Creatinine, Ser 1.44 (*)    GFR, Estimated 53 (*)    All other components within normal limits  CBC WITH DIFFERENTIAL/PLATELET - Abnormal; Notable for the following components:   RBC 6.31 (*)    MCV 77.5 (*)    MCH 24.9 (*)    RDW 17.3 (*)    Platelets 128 (*)    Neutro Abs 8.1 (*)    All other components within normal limits  URINALYSIS, W/ REFLEX TO CULTURE (INFECTION SUSPECTED) - Abnormal; Notable for the following components:   Color, Urine STRAW (*)    Glucose, UA >=500 (*)    Hgb urine dipstick MODERATE (*)    Leukocytes,Ua MODERATE (*)    Bacteria, UA RARE (*)    All other components within normal limits  URINE CULTURE    Notable for mild AKI  Medicines ordered and prescription drug management: No orders of the defined types were placed in this encounter.   -I have reviewed the patients home medicines and have made adjustments as needed  Social Determinants of Health:  Diagnosis or treatment significantly limited by social determinants of health: lives alone   Reevaluation: After the interventions noted  above, I reevaluated the patient and found that their symptoms have resolved  Co morbidities that complicate the patient evaluation  Past Medical History:  Diagnosis Date   Blood creatinine increased compared with prior measurement 07/29/2019   Diabetes mellitus without complication (HCC)    High risk sexual behavior 10/2007   Hypertension    Microcytosis    Schizophrenia (HCC)    Substance abuse (HCC)    tobacco use   UTI (urinary tract infection) 08/24/2016   Admitted 08/23/16-08/26/16 with several day history of UTI symptoms + positive UA. Culture resulted pan-sensitive E.coli. Was treated with IV Ceftriaxone and d/c home with Bactrim to complete 10 day course.       Dispostion: Disposition decision including need for hospitalization was considered, and patient discharged from emergency department.    Final Clinical Impression(s) / ED Diagnoses Final diagnoses:  Urinary retention     This chart was dictated using voice recognition software.  Despite best efforts to proofread,  errors can occur which can change the documentation meaning.    Lonell Grandchild, MD 12/01/22 1254

## 2022-12-02 LAB — URINE CULTURE: Culture: <10000 — AB

## 2022-12-04 ENCOUNTER — Telehealth: Payer: Self-pay | Admitting: *Deleted

## 2022-12-04 NOTE — Telephone Encounter (Signed)
Pt had called the front office to have foley catheter removed; appt given. I called pt who was seen in the ED and foley catheter was placed. Per ED instruction, pt to call Dr Vevelyn Royals for an appt. Pt stated he has seen urology in the past; informed pt to called today and schedule an appt with their office. He started quoting biblical scriptures; stated he can't function with a catheter. I told pt his urologist can d/c foley and if he has any problems,they can assess the situation. State he will call.

## 2022-12-05 ENCOUNTER — Ambulatory Visit (HOSPITAL_COMMUNITY): Payer: 59

## 2022-12-05 ENCOUNTER — Telehealth: Payer: Self-pay

## 2022-12-05 VITALS — BP 145/83 | HR 96 | Ht 72.5 in | Wt 215.0 lb

## 2022-12-05 DIAGNOSIS — F200 Paranoid schizophrenia: Secondary | ICD-10-CM

## 2022-12-05 NOTE — Progress Notes (Signed)
Patient came in today for his due biweekly injection of Fluphenazine Decanoate 125 mg/ 5mL - Inject 1 ml IM q 14 days. Patient presents in a good mood and very talkative. Patient received his [employer] Mccallum letter excusing him from service and was looking forward to the rest of his day. Injection was prepared as ordered and administered in patients Right Deltoid per his request. Patient tolerated well and without complaint and confirmed he will see me in two weeks.   NDC: 16109-604-54 LOT: [id number] EXP: MAY 2025

## 2022-12-05 NOTE — Telephone Encounter (Signed)
Transition Care Management Unsuccessful Follow-up Telephone Call  Date of discharge and from where:  12/01/2022 [facility]  Attempts:  1st Attempt  Reason for unsuccessful TCM follow-up call:  No answer/busy  Kaytelynn Scripter [facility]  [facility] Population Health Community Resource Care Guide   [email or web address]  ?? [phone]   Website: [email or web address]  [email or web address]

## 2022-12-06 ENCOUNTER — Telehealth: Payer: Self-pay

## 2022-12-06 NOTE — Telephone Encounter (Signed)
Transition Care Management Unsuccessful Follow-up Telephone Call  Date of discharge and from where:  12/01/2022 [facility]  Attempts:  2nd Attempt  Reason for unsuccessful TCM follow-up call:  No answer/busy  Cephas Revard [facility]  [facility] Population Health Community Resource Care Guide   [email or web address]  ?? [phone]   Website: [email or web address]  [email or web address]

## 2022-12-07 ENCOUNTER — Ambulatory Visit (INDEPENDENT_AMBULATORY_CARE_PROVIDER_SITE_OTHER): Payer: 59 | Admitting: Podiatry

## 2022-12-07 ENCOUNTER — Encounter: Payer: Self-pay | Admitting: Podiatry

## 2022-12-07 DIAGNOSIS — D2372 Other benign neoplasm of skin of left lower limb, including hip: Secondary | ICD-10-CM

## 2022-12-07 DIAGNOSIS — L84 Corns and callosities: Secondary | ICD-10-CM | POA: Diagnosis not present

## 2022-12-07 DIAGNOSIS — B351 Tinea unguium: Secondary | ICD-10-CM

## 2022-12-07 DIAGNOSIS — D2371 Other benign neoplasm of skin of right lower limb, including hip: Secondary | ICD-10-CM | POA: Diagnosis not present

## 2022-12-07 DIAGNOSIS — M79676 Pain in unspecified toe(s): Secondary | ICD-10-CM

## 2022-12-07 NOTE — Progress Notes (Signed)
Wayne Schmidt presents today complaining of painful elongated toenails as well as calluses between the fourth and fifth toes.  Objective: Vital signs stable he is alert and oriented x 3 pulses are palpable.  He has heloma molle's between the fourth and fifth toes with peeling skin.  Toenails are thick yellow dystrophic clinically mycotic.  Assessment: Pain limb secondary to benign skin lesions heloma molle and pain in limb secondary to onychomycosis.  Plan: Debridement of toenails bilateral and debridement of the heloma molle.  I did recommend that he continue the clotrimazole cream he states that he would get that from his other doctor he did not want me to refill it.

## 2022-12-13 DIAGNOSIS — R338 Other retention of urine: Secondary | ICD-10-CM | POA: Diagnosis not present

## 2022-12-15 ENCOUNTER — Other Ambulatory Visit: Payer: Self-pay

## 2022-12-15 ENCOUNTER — Emergency Department (HOSPITAL_COMMUNITY)
Admission: EM | Admit: 2022-12-15 | Discharge: 2022-12-15 | Disposition: A | Discharge status: Home / Self Care | Payer: 59 | Attending: Emergency Medicine | Admitting: Emergency Medicine

## 2022-12-15 DIAGNOSIS — F17200 Nicotine dependence, unspecified, uncomplicated: Secondary | ICD-10-CM | POA: Diagnosis not present

## 2022-12-15 DIAGNOSIS — R1030 Lower abdominal pain, unspecified: Secondary | ICD-10-CM | POA: Insufficient documentation

## 2022-12-15 DIAGNOSIS — Z743 Need for continuous supervision: Secondary | ICD-10-CM | POA: Diagnosis not present

## 2022-12-15 DIAGNOSIS — Z79899 Other long term (current) drug therapy: Secondary | ICD-10-CM | POA: Diagnosis not present

## 2022-12-15 DIAGNOSIS — R339 Retention of urine, unspecified: Secondary | ICD-10-CM | POA: Insufficient documentation

## 2022-12-15 DIAGNOSIS — I10 Essential (primary) hypertension: Secondary | ICD-10-CM | POA: Diagnosis not present

## 2022-12-15 DIAGNOSIS — R300 Dysuria: Secondary | ICD-10-CM | POA: Insufficient documentation

## 2022-12-15 DIAGNOSIS — E119 Type 2 diabetes mellitus without complications: Secondary | ICD-10-CM | POA: Insufficient documentation

## 2022-12-15 DIAGNOSIS — R6889 Other general symptoms and signs: Secondary | ICD-10-CM | POA: Diagnosis not present

## 2022-12-15 DIAGNOSIS — Z7984 Long term (current) use of oral hypoglycemic drugs: Secondary | ICD-10-CM | POA: Insufficient documentation

## 2022-12-15 LAB — URINALYSIS, ROUTINE W REFLEX MICROSCOPIC
Bacteria, UA: NONE SEEN
Bilirubin Urine: NEGATIVE
Glucose, UA: >=500 mg/dL — AB
Hgb urine dipstick: NEGATIVE
Ketones, ur: NEGATIVE mg/dL
Leukocytes,Ua: NEGATIVE
Nitrite: NEGATIVE
Protein, ur: NEGATIVE mg/dL
Specific Gravity, Urine: 1.015 (ref 1.005–1.030)
pH: 6 (ref 5.0–8.0)

## 2022-12-15 NOTE — ED Notes (Signed)
950 mL emptied from foley bag. Leg bag placed.

## 2022-12-15 NOTE — ED Triage Notes (Signed)
Pt BIBA from home. C/o pelvic pain and urinary retention. Pt had catheter removed 2x days ago.  AOx4

## 2022-12-15 NOTE — ED Provider Notes (Signed)
[place] EMERGENCY DEPARTMENT AT [facility] Provider Note   CSN: [id number] Arrival date & time: 12/15/22  0056     History  Chief Complaint  Patient presents with   Pelvic Pain   Urinary Retention    Wayne Schmidt is a 67 y.o. male.  The history is provided by the patient.  Patient history of diabetes, hypertension, schizophrenia, BPH presents with difficulty urinating. Patient reports he had a Foley catheter in for about 2 weeks.  It was just removed about 2 days ago. Over the past several hours he is having lower abdominal pain and difficulty urinating.  No fevers or vomiting.  No back pain.    Past Medical History:  Diagnosis Date   Blood creatinine increased compared with prior measurement 07/29/2019   Diabetes mellitus without complication (HCC)    High risk sexual behavior 10/2007   Hypertension    Microcytosis    Schizophrenia (HCC)    Substance abuse (HCC)    tobacco use   UTI (urinary tract infection) 08/24/2016   Admitted 08/23/16-08/26/16 with several day history of UTI symptoms + positive UA. Culture resulted pan-sensitive E.coli. Was treated with IV Ceftriaxone and d/c home with Bactrim to complete 10 day course.     Home Medications Prior to Admission medications   Medication Sig Start Date End Date Taking? Authorizing Provider  clotrimazole (LOTRIMIN) 1 % cream Apply 1 Application topically 2 (two) times daily. 11/23/22   Gust Rung, DO  doxazosin (CARDURA) 2 MG tablet TAKE (1) TABLET BY MOUTH ONCE DAILY. 05/19/22   Earl Lagos, MD  empagliflozin (JARDIANCE) 10 MG TABS tablet Take 1 tablet (10 mg total) by mouth daily before breakfast. 08/25/22   Gust Rung, DO  fluPHENAZine decanoate (PROLIXIN) 25 MG/ML injection Inject 1 mL (25 mg total) into the muscle every 14 (fourteen) days. 11/08/22   Arfeen, Phillips Grout, MD  fluticasone (FLONASE) 50 MCG/ACT nasal spray Place 1 spray into both nostrils daily. 08/24/22   Gust Rung, DO   hydroxypropyl methylcellulose / hypromellose (ISOPTO TEARS / GONIOVISC) 2.5 % ophthalmic solution Place 2 drops into both eyes daily as needed for dry eyes.    [provider]  losartan (COZAAR) 100 MG tablet TAKE (1) TABLET BY MOUTH ONCE DAILY. 05/19/22   Earl Lagos, MD  metFORMIN (GLUCOPHAGE-XR) 500 MG 24 hr tablet Take 1 tablet (500 mg total) by mouth daily with breakfast. 11/23/22   Gust Rung, DO  rosuvastatin (CRESTOR) 20 MG tablet TAKE (1) TABLET BY MOUTH ONCE DAILY. 05/19/22   Earl Lagos, MD  VOLTAREN 1 % GEL Apply 2 g topically 4 (four) times daily. 06/04/19   Kirt Boys, MD      Allergies    Ace inhibitors, Atorvastatin, Chlorpromazine hcl, and Simvastatin    Review of Systems   Review of Systems  Constitutional:  Negative for fever.  Gastrointestinal:  Negative for vomiting.  Genitourinary:  Positive for difficulty urinating.    Physical Exam Updated Vital Signs BP (!) 162/136 (BP Location: Right Arm)   Pulse 91   Temp 98.8 F (37.1 C) (Oral)   Resp 20   Ht 1.854 m (6\' 1" )   Wt 97.5 kg   SpO2 98%   BMI 28.37 kg/m  Physical Exam CONSTITUTIONAL: Elderly, anxious HEAD: Normocephalic/atraumatic EYES: EOMI/PERRL ENMT: Mucous membranes moist NECK: supple no meningeal signs CV: S1/S2 noted, no murmurs/rubs/gallops noted LUNGS: Lungs are clear to auscultation bilaterally, no apparent distress ABDOMEN: soft, suprapubic tenderness and fullness  is noted GU:no cva tenderness Exam chaperoned by nurse Vella Raring descended bilaterally without any tenderness noted.  No scrotal edema or erythema.  No hernias noted NEURO: Pt is awake/alert/appropriate, moves all extremitiesx4.  No facial droop.   EXTREMITIES:  full ROM SKIN: warm, color normal PSYCH: Anxious  ED Results / Procedures / Treatments   Labs (all labs ordered are listed, but only abnormal results are displayed) Labs Reviewed  URINALYSIS, ROUTINE W REFLEX MICROSCOPIC -  Abnormal; Notable for the following components:      Result Value   Color, Urine STRAW (*)    Glucose, UA >=500 (*)    All other components within normal limits    EKG None  Radiology No results found.  Procedures Procedures    Medications Ordered in ED Medications - No data to display  ED Course/ Medical Decision Making/ A&P Clinical Course as of 12/15/22 0211  Fri Dec 15, 2022  0113 Patient presents with difficulty urinating, just recently had a Foley catheter removed.  He appears uncomfortable and has suprapubic tenderness and fullness.  Will have nursing staff place Foley [DW]  0211 Patient improved after nursing staff placed Foley catheter.  No signs of UTI.  Patient reports feeling comfortable managing his catheter at home.  He will call his urologist later today [DW]    Clinical Course User Index [DW] Zadie Rhine, MD                             Medical Decision Making Amount and/or Complexity of Data Reviewed Labs: ordered.           Final Clinical Impression(s) / ED Diagnoses Final diagnoses:  Urinary retention    Rx / DC Orders ED Discharge Orders     None         Zadie Rhine, MD 12/15/22 0211

## 2022-12-19 ENCOUNTER — Other Ambulatory Visit: Payer: Self-pay

## 2022-12-19 ENCOUNTER — Ambulatory Visit (HOSPITAL_BASED_OUTPATIENT_CLINIC_OR_DEPARTMENT_OTHER): Payer: 59

## 2022-12-19 ENCOUNTER — Encounter (HOSPITAL_COMMUNITY): Payer: Self-pay

## 2022-12-19 VITALS — BP 128/77 | HR 85 | Ht 72.5 in | Wt 210.0 lb

## 2022-12-19 DIAGNOSIS — F200 Paranoid schizophrenia: Secondary | ICD-10-CM | POA: Diagnosis not present

## 2022-12-19 NOTE — Progress Notes (Signed)
Patient came in today for his due biweekly injection of Fluphenazine Decanoate 125 mg/ 5mL - Inject 1 ml IM q 14 days. Patient presents in a good mood and flat affect. Patient not as talkative today but said he is doing well. No SI/HI or AH/VH. Injection was prepared as ordered and administered in patients Left Deltoid per his request. Patient tolerated well and without complaint and confirmed he will see me in two weeks.   NDC: 40981-191-47 LOT: [id number] EXP: MAY 2025

## 2022-12-21 ENCOUNTER — Telehealth: Payer: Self-pay | Admitting: *Deleted

## 2022-12-21 DIAGNOSIS — R338 Other retention of urine: Secondary | ICD-10-CM | POA: Diagnosis not present

## 2022-12-21 NOTE — Telephone Encounter (Signed)
Transition Care Management Unsuccessful Follow-up Telephone Call  Date of discharge and from where:  [facility] 12/15/2022  Attempts:  1st Attempt  Reason for unsuccessful TCM follow-up call:  Left voice message

## 2022-12-21 NOTE — Telephone Encounter (Signed)
Transition Care Management Unsuccessful Follow-up Telephone Call  Date of discharge and from where:  [facility] 12/15/2022  Attempts:  2nd Attempt  Reason for unsuccessful TCM follow-up call: Patient declined to participate  said he was feeling terrible and hung up

## 2023-01-02 ENCOUNTER — Encounter (HOSPITAL_COMMUNITY): Payer: Self-pay | Admitting: *Deleted

## 2023-01-02 ENCOUNTER — Other Ambulatory Visit: Payer: Self-pay

## 2023-01-02 ENCOUNTER — Ambulatory Visit (HOSPITAL_BASED_OUTPATIENT_CLINIC_OR_DEPARTMENT_OTHER): Payer: 59 | Admitting: *Deleted

## 2023-01-02 VITALS — BP 125/77 | HR 78 | Ht 72.5 in | Wt 211.0 lb

## 2023-01-02 DIAGNOSIS — F200 Paranoid schizophrenia: Secondary | ICD-10-CM | POA: Diagnosis not present

## 2023-01-02 NOTE — Progress Notes (Signed)
Patient came in today for his due biweekly injection of Fluphenazine Decanoate 125 mg/ 5mL - Inject 1 ml IM q 14 days. Patient presents in a good mood and flat affect. Patient in a good mood and said he is doing well. No SI/HI or AH/VH. Injection was prepared as ordered and administered in patients Right Deltoid per his request. Patient tolerated well and without complaint and confirmed he will see me in two weeks.    NDC: 16109-604-54 LOT: [id number] EXP: MAY 2025

## 2023-01-10 ENCOUNTER — Telehealth: Payer: Self-pay

## 2023-01-10 NOTE — Telephone Encounter (Signed)
Pt is requesting a call back .Marland Kitchen He is requesting to be taking off of his metformin .Marland Kitchen He is listing a bunch of side affects and is  not to happy  stating that he  has spoken to his PCP about it  but  is still bring  given the MED ... When asked which  side affect is he having he stated all of them .... Pt  also stated that if he does not answer the  house number  please call the mobil number also

## 2023-01-12 NOTE — Telephone Encounter (Signed)
I called. could not tolerate the trial of metformin er 500mg  daily, due to diarrhea will discontinue.  He will follow up with me in 1-2 months and we can discuss other options

## 2023-01-16 ENCOUNTER — Ambulatory Visit (HOSPITAL_BASED_OUTPATIENT_CLINIC_OR_DEPARTMENT_OTHER): Payer: 59 | Admitting: *Deleted

## 2023-01-16 VITALS — BP 118/77 | HR 88 | Resp 20 | Ht 73.0 in | Wt 212.6 lb

## 2023-01-16 DIAGNOSIS — F200 Paranoid schizophrenia: Secondary | ICD-10-CM

## 2023-01-16 NOTE — Patient Instructions (Signed)
Pt presents today for due Prolixin 25 mg injection. Pt pleasant and cooperative on approach. Pt remains religiously preoccupied at baseline. Pt denies AVH, SI, HI. Pt states that he has been in great deal of pain r/t prostate/urinary issues and is currently wearing a catheter/leg bag. Pt says he has an appointment with Urology upcoming on 01/18/23. Injection prepared and given as ordered, administered in LD without complaint. Writer reviewed the importance of rotating sites and to continue using gluteal muscle site for this medication. Pt verbalizes understanding. Pt will call wit any questions or concerns. Pt to return in weeks for next due injection.

## 2023-01-18 DIAGNOSIS — R338 Other retention of urine: Secondary | ICD-10-CM | POA: Diagnosis not present

## 2023-01-21 ENCOUNTER — Other Ambulatory Visit: Payer: Self-pay

## 2023-01-21 ENCOUNTER — Emergency Department (HOSPITAL_COMMUNITY)
Admission: EM | Admit: 2023-01-21 | Discharge: 2023-01-22 | Disposition: A | Discharge status: Home / Self Care | Payer: 59 | Attending: Emergency Medicine | Admitting: Emergency Medicine

## 2023-01-21 ENCOUNTER — Encounter (HOSPITAL_COMMUNITY): Payer: Self-pay | Admitting: Emergency Medicine

## 2023-01-21 DIAGNOSIS — T83198A Other mechanical complication of other urinary devices and implants, initial encounter: Secondary | ICD-10-CM | POA: Diagnosis not present

## 2023-01-21 DIAGNOSIS — N400 Enlarged prostate without lower urinary tract symptoms: Secondary | ICD-10-CM | POA: Insufficient documentation

## 2023-01-21 DIAGNOSIS — Z87440 Personal history of urinary (tract) infections: Secondary | ICD-10-CM | POA: Diagnosis not present

## 2023-01-21 DIAGNOSIS — I10 Essential (primary) hypertension: Secondary | ICD-10-CM | POA: Insufficient documentation

## 2023-01-21 DIAGNOSIS — T839XXA Unspecified complication of genitourinary prosthetic device, implant and graft, initial encounter: Secondary | ICD-10-CM | POA: Diagnosis not present

## 2023-01-21 DIAGNOSIS — Y732 Prosthetic and other implants, materials and accessory gastroenterology and urology devices associated with adverse incidents: Secondary | ICD-10-CM | POA: Insufficient documentation

## 2023-01-21 DIAGNOSIS — T83098A Other mechanical complication of other indwelling urethral catheter, initial encounter: Secondary | ICD-10-CM | POA: Diagnosis not present

## 2023-01-21 DIAGNOSIS — R531 Weakness: Secondary | ICD-10-CM | POA: Diagnosis not present

## 2023-01-21 DIAGNOSIS — F17210 Nicotine dependence, cigarettes, uncomplicated: Secondary | ICD-10-CM | POA: Insufficient documentation

## 2023-01-21 DIAGNOSIS — Z743 Need for continuous supervision: Secondary | ICD-10-CM | POA: Diagnosis not present

## 2023-01-21 DIAGNOSIS — E119 Type 2 diabetes mellitus without complications: Secondary | ICD-10-CM | POA: Insufficient documentation

## 2023-01-21 HISTORY — DX: Benign prostatic hyperplasia without lower urinary tract symptoms: N40.0

## 2023-01-21 NOTE — ED Triage Notes (Signed)
Pt to ED via [facility] from home c/o foley catheter leaking today.  States had catheter originally placed 6wks ago d/t enlarged prostate.  Catheter replaced 2 weeks ago.  Pt denies pain, urine noted in bag.  EMS vitals 132/86, 96% RA, 82 HR, 18 RR.

## 2023-01-22 ENCOUNTER — Telehealth: Payer: Self-pay

## 2023-01-22 DIAGNOSIS — T839XXA Unspecified complication of genitourinary prosthetic device, implant and graft, initial encounter: Secondary | ICD-10-CM | POA: Diagnosis not present

## 2023-01-22 NOTE — ED Notes (Signed)
Catheter flushed, new leg bag attached, urine draining and no leaking noted.

## 2023-01-22 NOTE — ED Provider Notes (Signed)
[facility]-EMERGENCY DEPT [facility] Emergency Department Provider Note MRN:  [id number]  Arrival date & time: 01/22/23     Chief Complaint   Foley Cath Leaking   History of Present Illness   Wayne Schmidt is a 67 y.o. year-old male with a history of BPH presenting to the ED with chief complaint of Foley cath leaking.  Patient explains that he was noticing some leakage of urine around the catheter today.  Has had it in place for the past few weeks due to an enlarged prostate.  Denies abdominal pain, no fever, no other complaints.  Review of Systems  A thorough review of systems was obtained and all systems are negative except as noted in the HPI and PMH.   Patient's Health History    Past Medical History:  Diagnosis Date   Blood creatinine increased compared with prior measurement 07/29/2019   Diabetes mellitus without complication (HCC)    Enlarged prostate    High risk sexual behavior 10/2007   Hypertension    Microcytosis    Schizophrenia (HCC)    Substance abuse (HCC)    tobacco use   UTI (urinary tract infection) 08/24/2016   Admitted 08/23/16-08/26/16 with several day history of UTI symptoms + positive UA. Culture resulted pan-sensitive E.coli. Was treated with IV Ceftriaxone and d/c home with Bactrim to complete 10 day course.     Past Surgical History:  Procedure Laterality Date   LAMINECTOMY     by Dr Elesa Hacker    Family History  Problem Relation Age of Onset   Diabetes Mother    Hypertension Mother    Lupus Sister    Sickle cell anemia Brother    Drug abuse Brother     Social History   Socioeconomic History   Marital status: Legally Separated    Spouse name: Not on file   Number of children: Not on file   Years of education: Not on file   Highest education level: Not on file  Occupational History   Not on file  Tobacco Use   Smoking status: Every Day    Current packs/day: 1.00    Average packs/day: 1 pack/day for 38.0 years (38.0 ttl pk-yrs)     Types: Cigarettes   Smokeless tobacco: Never  Vaping Use   Vaping status: Never Used  Substance and Sexual Activity   Alcohol use: Yes    Comment: Seldom.   Drug use: No    Comment: h/o remote cocaine use-denies present use   Sexual activity: Not Currently  Other Topics Concern   Not on file  Social History Narrative   Patient given diabetes card 07/07/2010   Social Determinants of Health   Financial Resource Strain: Not on file  Food Insecurity: Not on file  Transportation Needs: Not on file  Physical Activity: Not on file  Stress: Not on file  Social Connections: Not on file  Intimate Partner Violence: Not on file     Physical Exam   Vitals:   01/21/23 2358  BP: 138/83  Pulse: 64  Resp: 14  Temp: 97.7 F (36.5 C)  SpO2: 98%    CONSTITUTIONAL: Well-appearing, NAD NEURO/PSYCH:  Alert and oriented x 3, no focal deficits EYES:  eyes equal and reactive ENT/NECK:  no LAD, no JVD CARDIO: Regular rate, well-perfused, normal S1 and S2 PULM:  CTAB no wheezing or rhonchi GI/GU:  non-distended, non-tender MSK/SPINE:  No gross deformities, no edema SKIN:  no rash, atraumatic   *Additional and/or pertinent findings included in MDM  below  Diagnostic and Interventional Summary    EKG Interpretation Date/Time:    Ventricular Rate:    PR Interval:    QRS Duration:    QT Interval:    QTC Calculation:   R Axis:      Text Interpretation:         Labs Reviewed  URINE CULTURE    No orders to display    Medications - No data to display   Procedures  /  Critical Care Procedures  ED Course and Medical Decision Making  Initial Impression and Ddx Suspect clogged Foley catheter.  Will attempt flushing the catheter versus replacement if needed.  No abdominal tenderness, normal vitals, in no acute distress with really no symptoms otherwise.  Anticipating discharge.  Past medical/surgical history that increases complexity of ED encounter: BPH  Interpretation of  Diagnostics Laboratory and/or imaging options to aid in the diagnosis/care of the patient were considered.  After careful history and physical examination, it was determined that there was no indication for diagnostics at this time.  Urine culture sent.  Patient Reassessment and Ultimate Disposition/Management     Catheter flushed and working normally, appropriate for discharge.  Patient management required discussion with the following services or consulting groups:  None  Complexity of Problems Addressed Acute complicated illness or Injury  Additional Data Reviewed and Analyzed Further history obtained from: None  Additional Factors Impacting ED Encounter Risk None  Elmer Sow. Pilar Plate, MD [facility] Emergency Medicine [facility] [email or web address]  Final Clinical Impressions(s) / ED Diagnoses     ICD-10-CM   1. Problem with Foley catheter, initial encounter (HCC)  [email or web address]       ED Discharge Orders     None        Discharge Instructions Discussed with and Provided to Patient:     Discharge Instructions      You were evaluated in the Emergency Department and after careful evaluation, we did not find any emergent condition requiring admission or further testing in the hospital.  Your exam/testing today is overall reassuring.  Follow-up with urology for further management of your catheter.  Please return to the Emergency Department if you experience any worsening of your condition.   Thank you for allowing [place] to be a part of your care.       Sabas Sous, MD 01/22/23 [phone]

## 2023-01-22 NOTE — Telephone Encounter (Signed)
Transition Care Management Follow-up Telephone Call Date of discharge and from where: 01/22/2023 [facility] How have you been since you were released from the hospital? Patient is feeling fine, foley cath leak. Any questions or concerns? No  Items Reviewed: Did the pt receive and understand the discharge instructions provided? Yes  Medications obtained and verified?  No medication prescribed. Other? No  Any new allergies since your discharge? No  Dietary orders reviewed? Yes Do you have support at home? Yes   Follow up appointments reviewed:  PCP Hospital f/u appt confirmed? No  Scheduled to see  on  @ . Specialist Hospital f/u appt confirmed? No  Scheduled to see  on  @ . Are transportation arrangements needed? No  If their condition worsens, is the pt aware to call PCP or go to the Emergency Dept.? Yes Was the patient provided with contact information for the PCP's office or ED? Yes Was to pt encouraged to call back with questions or concerns? Yes  Mena Lienau [facility]  [facility] Population Health Community Resource Care Guide   [email or web address]  ?? [phone]   Website: [email or web address]  [email or web address]

## 2023-01-22 NOTE — Discharge Instructions (Signed)
You were evaluated in the Emergency Department and after careful evaluation, we did not find any emergent condition requiring admission or further testing in the hospital.  Your exam/testing today is overall reassuring.  Follow-up with urology for further management of your catheter.  Please return to the Emergency Department if you experience any worsening of your condition.   Thank you for allowing [place] to be a part of your care.

## 2023-01-23 LAB — URINE CULTURE

## 2023-01-30 ENCOUNTER — Ambulatory Visit (HOSPITAL_BASED_OUTPATIENT_CLINIC_OR_DEPARTMENT_OTHER): Payer: 59 | Admitting: *Deleted

## 2023-01-30 ENCOUNTER — Encounter (HOSPITAL_COMMUNITY): Payer: Self-pay | Admitting: *Deleted

## 2023-01-30 VITALS — BP 136/88 | HR 80 | Resp 18 | Ht 72.0 in | Wt 216.0 lb

## 2023-01-30 DIAGNOSIS — F200 Paranoid schizophrenia: Secondary | ICD-10-CM

## 2023-01-30 NOTE — Patient Instructions (Signed)
Pt presents today for due Prolixin 25 mg injection. Pt is cooperative and pleasant on approach. Pt baseline hyper religiosity. Pt denies SI, HI, and AVH. No complaints noted. Injection was prepared as ordered and given in LD without complaint. Pt to return in 2 weeks for next due injection. Pt encouraged to call with any questions or concerns.

## 2023-02-05 NOTE — Progress Notes (Signed)
Culture to evaluate for infection of urine vs colonization

## 2023-02-07 ENCOUNTER — Telehealth (HOSPITAL_COMMUNITY): Payer: 59 | Admitting: Psychiatry

## 2023-02-13 ENCOUNTER — Ambulatory Visit (HOSPITAL_BASED_OUTPATIENT_CLINIC_OR_DEPARTMENT_OTHER): Payer: 59 | Admitting: *Deleted

## 2023-02-13 VITALS — BP 130/77 | HR 87 | Resp 18 | Ht 73.0 in | Wt 213.2 lb

## 2023-02-13 DIAGNOSIS — F200 Paranoid schizophrenia: Secondary | ICD-10-CM | POA: Diagnosis not present

## 2023-02-13 NOTE — Patient Instructions (Signed)
Pt presents today for due Prolixin 25 mg injection. Pt is cooperative, appropriate and pleasant on approach per usual presentation. Pt has complaint about his Metformin, which is addressed by PCP, but otherwise no c/o. Denies AVH, SI or HI. Injection prepared as ordered and given in RD without complaints. Wayne Schmidt will return in 2 weeks for next due injection.

## 2023-02-23 ENCOUNTER — Other Ambulatory Visit: Payer: Self-pay | Admitting: Urology

## 2023-02-26 ENCOUNTER — Other Ambulatory Visit: Payer: Self-pay | Admitting: Internal Medicine

## 2023-02-26 DIAGNOSIS — E785 Hyperlipidemia, unspecified: Secondary | ICD-10-CM

## 2023-02-26 DIAGNOSIS — I10 Essential (primary) hypertension: Secondary | ICD-10-CM

## 2023-02-27 ENCOUNTER — Ambulatory Visit (HOSPITAL_BASED_OUTPATIENT_CLINIC_OR_DEPARTMENT_OTHER): Payer: 59

## 2023-02-27 VITALS — BP 140/80 | HR 71 | Ht 72.5 in | Wt 218.0 lb

## 2023-02-27 DIAGNOSIS — F200 Paranoid schizophrenia: Secondary | ICD-10-CM

## 2023-02-27 NOTE — Progress Notes (Signed)
Patient arrived today for his due injection of Fluphenazine  Dec 125 mg/5 mL. Patient is on time and in a good mood today. He is looking forward to the fall weather and fall festivities. Patient denies any SI/HI or AVH at this time. Injection was prepared as ordered and administered in patients left Deltoid. Patient tolerated well and without complaint.   NDC: 28413-244-01 LOT: [id number] EXP: MAY 2025

## 2023-03-03 ENCOUNTER — Emergency Department (HOSPITAL_COMMUNITY)
Admission: EM | Admit: 2023-03-03 | Discharge: 2023-03-03 | Discharge status: Absent without leave | Payer: 59 | Source: Home / Self Care

## 2023-03-07 ENCOUNTER — Other Ambulatory Visit: Payer: Self-pay | Admitting: Internal Medicine

## 2023-03-07 DIAGNOSIS — I10 Essential (primary) hypertension: Secondary | ICD-10-CM

## 2023-03-07 DIAGNOSIS — R338 Other retention of urine: Secondary | ICD-10-CM | POA: Diagnosis not present

## 2023-03-13 ENCOUNTER — Ambulatory Visit (HOSPITAL_BASED_OUTPATIENT_CLINIC_OR_DEPARTMENT_OTHER): Payer: 59

## 2023-03-13 ENCOUNTER — Other Ambulatory Visit: Payer: Self-pay

## 2023-03-13 ENCOUNTER — Encounter (HOSPITAL_COMMUNITY): Payer: Self-pay

## 2023-03-13 VITALS — BP 136/74 | HR 72 | Ht 72.5 in | Wt 216.0 lb

## 2023-03-13 DIAGNOSIS — F200 Paranoid schizophrenia: Secondary | ICD-10-CM | POA: Diagnosis not present

## 2023-03-13 NOTE — Progress Notes (Signed)
Patient arrived today for his due injection of Fluphenazine Dec 125 mg/44mL. Patient was pleasant and cooperative. His thought process seems disorganized today, patient denies any AVH or SI?HI. He is not making a lot of sense when he is speaking. I will have him make a follow up with Dr. Lolly Mustache asap. Injection was prepared as ordered and administered in patients LUOQ. Patient tolerated well and without complaint   NDC:67457-359-59 QMV:[id number] EXP: MAY 2025

## 2023-03-15 ENCOUNTER — Ambulatory Visit (INDEPENDENT_AMBULATORY_CARE_PROVIDER_SITE_OTHER): Payer: 59 | Admitting: Internal Medicine

## 2023-03-15 ENCOUNTER — Emergency Department (HOSPITAL_COMMUNITY)
Admission: EM | Admit: 2023-03-15 | Discharge: 2023-03-15 | Disposition: A | Discharge status: Home / Self Care | Payer: 59 | Attending: Emergency Medicine | Admitting: Emergency Medicine

## 2023-03-15 ENCOUNTER — Encounter: Payer: Self-pay | Admitting: Internal Medicine

## 2023-03-15 ENCOUNTER — Other Ambulatory Visit: Payer: Self-pay

## 2023-03-15 VITALS — BP 139/71 | HR 70 | Temp 97.7°F | Ht 73.0 in | Wt 216.8 lb

## 2023-03-15 DIAGNOSIS — T83091A Other mechanical complication of indwelling urethral catheter, initial encounter: Secondary | ICD-10-CM | POA: Diagnosis not present

## 2023-03-15 DIAGNOSIS — G2119 Other drug induced secondary parkinsonism: Secondary | ICD-10-CM

## 2023-03-15 DIAGNOSIS — I2510 Atherosclerotic heart disease of native coronary artery without angina pectoris: Secondary | ICD-10-CM

## 2023-03-15 DIAGNOSIS — F17210 Nicotine dependence, cigarettes, uncomplicated: Secondary | ICD-10-CM | POA: Diagnosis not present

## 2023-03-15 DIAGNOSIS — F17200 Nicotine dependence, unspecified, uncomplicated: Secondary | ICD-10-CM

## 2023-03-15 DIAGNOSIS — R718 Other abnormality of red blood cells: Secondary | ICD-10-CM

## 2023-03-15 DIAGNOSIS — E1169 Type 2 diabetes mellitus with other specified complication: Secondary | ICD-10-CM | POA: Diagnosis not present

## 2023-03-15 DIAGNOSIS — Z23 Encounter for immunization: Secondary | ICD-10-CM

## 2023-03-15 DIAGNOSIS — E782 Mixed hyperlipidemia: Secondary | ICD-10-CM

## 2023-03-15 DIAGNOSIS — Z79899 Other long term (current) drug therapy: Secondary | ICD-10-CM | POA: Insufficient documentation

## 2023-03-15 DIAGNOSIS — T839XXA Unspecified complication of genitourinary prosthetic device, implant and graft, initial encounter: Secondary | ICD-10-CM

## 2023-03-15 DIAGNOSIS — I10 Essential (primary) hypertension: Secondary | ICD-10-CM | POA: Diagnosis not present

## 2023-03-15 LAB — GLUCOSE, CAPILLARY: Glucose-Capillary: 116 mg/dL — ABNORMAL HIGH (ref 70–99)

## 2023-03-15 LAB — POCT GLYCOSYLATED HEMOGLOBIN (HGB A1C): Hemoglobin A1C: 6.9 % — AB (ref 4.0–5.6)

## 2023-03-15 NOTE — Progress Notes (Signed)
Established Patient Office Visit  Subjective   Patient ID: Wayne Schmidt, male    DOB: [birth year]  Age: 67 y.o. MRN: [id number]  Chief Complaint  Patient presents with   Follow-up    Pt would like to discuss referral with cardiologist   Wayne Schmidt is here today to follow-up hypertension and diabetes.  He would also like to discussed heart health.  For his hypertension diabetes he has been taking his medications as prescribed he denies any orthostasis symptoms hyper or hypoglycemia.  He has not been having any polyuria but he currently has a catheter in place.  For for his urinary retention he reports to me that recent urodynamic studies were promising and he is hopeful to get the catheter out after a procedure I suspect TURP.  For his heart he has previously seen Dr. Isabel Schmidt after he complained of some chest pain.  He underwent a coronary CTA which showed minimal CAD and was instructed to follow-up in 1 year.  He recently got a letter from Dr. Maryruth Schmidt office noting that he is leaving for [place] and wondering if he needs to see a new cardiologist.  He is still smoking cigarettes daily has cut back slightly has no plans to discontinue completely.       Objective:     BP 139/71 (BP Location: Right Arm, Patient Position: Sitting, Cuff Size: Normal)   Pulse 70   Temp 97.7 F (36.5 C) (Oral)   Ht 6\' 1"  (1.854 m)   Wt 216 lb 12.8 oz (98.3 kg)   SpO2 100%   BMI 28.60 kg/m  BP Readings from Last 3 Encounters:  03/15/23 124/86  03/15/23 139/71  01/22/23 137/72   Wt Readings from Last 3 Encounters:  03/15/23 216 lb (98 kg)  03/15/23 216 lb 12.8 oz (98.3 kg)  01/21/23 210 lb (95.3 kg)      Physical Exam Vitals and nursing note reviewed.  Constitutional:      Appearance: Normal appearance.  Pulmonary:     Effort: Pulmonary effort is normal.     Breath sounds: Normal breath sounds.  Neurological:     Mental Status: He is alert.      Results for orders placed or  performed in visit on 03/15/23  Lipid Profile  Result Value Ref Range   Cholesterol, Total 154 100 - 199 mg/dL   Triglycerides 49 0 - 149 mg/dL   HDL 74 >78 mg/dL   VLDL Cholesterol Cal 11 5 - 40 mg/dL   LDL Chol Calc (NIH) 69 0 - 99 mg/dL   Chol/HDL Ratio 2.1 0.0 - 5.0 ratio  BMP8+Anion Gap  Result Value Ref Range   Glucose 102 (H) 70 - 99 mg/dL   BUN 13 8 - 27 mg/dL   Creatinine, Ser 2.95 0.76 - 1.27 mg/dL   eGFR 69 >62 ZH/YQM/5.78   BUN/Creatinine Ratio 11 10 - 24   Sodium 142 134 - 144 mmol/L   Potassium 4.6 3.5 - 5.2 mmol/L   Chloride 103 96 - 106 mmol/L   CO2 23 20 - 29 mmol/L   Anion Gap 16.0 10.0 - 18.0 mmol/L   Calcium 9.4 8.6 - 10.2 mg/dL  CBC no Diff  Result Value Ref Range   WBC 6.6 3.4 - 10.8 x10E3/uL   RBC 6.38 (H) 4.14 - 5.80 x10E6/uL   Hemoglobin 15.8 13.0 - 17.7 g/dL   Hematocrit 46.9 (H) 62.9 - 51.0 %   MCV 82 79 - 97 fL   MCH 24.8 (  L) 26.6 - 33.0 pg   MCHC 30.2 (L) 31.5 - 35.7 g/dL   RDW 60.6 (H) 30.1 - 60.1 %   Platelets 181 150 - 450 x10E3/uL  Iron, TIBC and Ferritin Panel  Result Value Ref Range   Total Iron Binding Capacity 268 250 - 450 ug/dL   UIBC 093 235 - 573 ug/dL   Iron 66 38 - 220 ug/dL   Iron Saturation 25 15 - 55 %   Ferritin WILL FOLLOW   Glucose, capillary  Result Value Ref Range   Glucose-Capillary 116 (H) 70 - 99 mg/dL  POC Hbg U5K  Result Value Ref Range   Hemoglobin A1C 6.9 (A) 4.0 - 5.6 %   HbA1c POC (<> result, manual entry)     HbA1c, POC (prediabetic range)     HbA1c, POC (controlled diabetic range)      Last CBC Lab Results  Component Value Date   WBC 6.6 03/15/2023   HGB 15.8 03/15/2023   HCT 52.4 (H) 03/15/2023   MCV 82 03/15/2023   MCH 24.8 (L) 03/15/2023   RDW 17.1 (H) 03/15/2023   PLT 181 03/15/2023   Last metabolic panel Lab Results  Component Value Date   GLUCOSE 102 (H) 03/15/2023   NA 142 03/15/2023   K 4.6 03/15/2023   CL 103 03/15/2023   CO2 23 03/15/2023   BUN 13 03/15/2023   CREATININE 1.16  03/15/2023   EGFR 69 03/15/2023   CALCIUM 9.4 03/15/2023   PROT 7.1 12/01/2022   ALBUMIN 4.1 12/01/2022   LABGLOB 2.4 09/20/2021   AGRATIO 1.9 09/20/2021   BILITOT 0.6 12/01/2022   ALKPHOS 67 12/01/2022   AST 28 12/01/2022   ALT 21 12/01/2022   ANIONGAP 10 12/01/2022   Last lipids Lab Results  Component Value Date   CHOL 154 03/15/2023   HDL 74 03/15/2023   LDLCALC 69 03/15/2023   TRIG 49 03/15/2023   CHOLHDL 2.1 03/15/2023   Last hemoglobin A1c Lab Results  Component Value Date   HGBA1C 6.9 (A) 03/15/2023      The 10-year ASCVD risk score (Arnett DK, et al., 2019) is: 39.3%    Assessment & Plan:   Problem List Items Addressed This Visit       Cardiovascular and Mediastinum   Essential hypertension (Chronic)    Blood pressure well-controlled no changes to medications.      Relevant Orders   Lipid Profile (Completed)   BMP8+Anion Gap (Completed)   Coronary artery disease involving native coronary artery of native heart without angina pectoris    I discussed with him that since he is not having any active anginal symptoms and had only minimal CAD he does not have to see a cardiologist yearly at this time.  We need to continue to work on controlling his blood pressure diabetes cholesterol and ideally quit smoking.  He was appreciative and agreeable to this plan he is doing well on all of those with the exception of quitting smoking.        Endocrine   Type 2 diabetes mellitus with other specified complication (HCC) - Primary    A1c is less than 7%.  Continue Jardiance 10 mg daily.      Relevant Orders   POC Hbg A1C (Completed)   Lipid Profile (Completed)   BMP8+Anion Gap (Completed)     Nervous and Auditory   Other drug-induced secondary parkinsonism (HCC)    Continues to have some abnormal tongue movement but not overtly bothered by  it.        Other   TOBACCO ABUSE (Chronic)    Precontemplative for quitting.      Hyperlipidemia (Chronic)     Recheck lipid panel today LDL is less than 70 at goal.      Other Visit Diagnoses     Microcytosis       Relevant Orders   CBC no Diff (Completed)   Iron, TIBC and Ferritin Panel (Completed)   Encounter for immunization       Relevant Orders   Flu Vaccine Trivalent High Dose (Fluad) (Completed)       Return in about 3 months (around 06/14/2023).    Gust Rung, DO

## 2023-03-15 NOTE — ED Triage Notes (Signed)
Pt sts concern for foley catheter leaking and leaking from bag along with increased pain.

## 2023-03-15 NOTE — Discharge Instructions (Signed)
Follow-up with your urologist as needed. °

## 2023-03-15 NOTE — ED Provider Notes (Signed)
EMERGENCY DEPARTMENT AT [facility] Provider Note   CSN: [id number] Arrival date & time: 03/15/23  2242     History  Chief Complaint  Patient presents with   Foley Catheter Leaking    Wayne Schmidt is a 67 y.o. male.  Multiple wounds to the emergency department for evaluation of Foley catheter problems.  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  He has been having leaking around the tube at his penis as well as from the bag.  No pain.  No fever.       Home Medications Prior to Admission medications   Medication Sig Start Date End Date Taking? Authorizing Provider  clotrimazole (LOTRIMIN) 1 % cream Apply 1 Application topically 2 (two) times daily. 11/23/22   Gust Rung, DO  doxazosin (CARDURA) 2 MG tablet TAKE 1 TABLET BY MOUTH ONCE DAILY 03/08/23   Gust Rung, DO  empagliflozin (JARDIANCE) 10 MG TABS tablet Take 1 tablet (10 mg total) by mouth daily before breakfast. 08/25/22   Gust Rung, DO  fluPHENAZine decanoate (PROLIXIN) 25 MG/ML injection Inject 1 mL (25 mg total) into the muscle every 14 (fourteen) days. 11/08/22   Arfeen, Phillips Grout, MD  fluticasone (FLONASE) 50 MCG/ACT nasal spray Place 1 spray into both nostrils daily. 08/24/22   Gust Rung, DO  hydroxypropyl methylcellulose / hypromellose (ISOPTO TEARS / GONIOVISC) 2.5 % ophthalmic solution Place 2 drops into both eyes daily as needed for dry eyes.    [provider]  losartan (COZAAR) 100 MG tablet TAKE 1 TABLET BY MOUTH ONCE DAILY 02/28/23   Carlynn Purl C, DO  rosuvastatin (CRESTOR) 20 MG tablet TAKE 1 TABLET BY MOUTH ONCE DAILY 02/28/23   Carlynn Purl C, DO  VOLTAREN 1 % GEL Apply 2 g topically 4 (four) times daily. 06/04/19   Kirt Boys, MD      Allergies    Ace inhibitors, Atorvastatin, Chlorpromazine hcl, Metformin and related, and Simvastatin    Review of Systems   Review of Systems  Physical Exam Updated Vital  Signs BP 124/86 (BP Location: Left Arm)   Pulse 88   Temp 97.9 F (36.6 C) (Oral)   Resp 17   Ht 6\' 1"  (1.854 m)   Wt 98 kg   SpO2 100%   BMI 28.50 kg/m  Physical Exam Vitals and nursing note reviewed.  Constitutional:      General: He is not in acute distress.    Appearance: He is well-developed.  HENT:     Head: Normocephalic and atraumatic.     Mouth/Throat:     Mouth: Mucous membranes are moist.  Eyes:     General: Vision grossly intact. Gaze aligned appropriately.     Extraocular Movements: Extraocular movements intact.     Conjunctiva/sclera: Conjunctivae normal.  Cardiovascular:     Rate and Rhythm: Normal rate and regular rhythm.     Pulses: Normal pulses.     Heart sounds: Normal heart sounds, S1 normal and S2 normal. No murmur heard.    No friction rub. No gallop.  Pulmonary:     Effort: Pulmonary effort is normal. No respiratory distress.     Breath sounds: Normal breath sounds.  Abdominal:     Palpations: Abdomen is soft.     Tenderness: There is no abdominal tenderness. There is no guarding or rebound.     Hernia: No hernia is present.  Musculoskeletal:        General:  No swelling.     Cervical back: Full passive range of motion without pain, normal range of motion and neck supple. No pain with movement, spinous process tenderness or muscular tenderness. Normal range of motion.     Right lower leg: No edema.     Left lower leg: No edema.  Skin:    General: Skin is warm and dry.     Capillary Refill: Capillary refill takes less than 2 seconds.     Findings: No ecchymosis, erythema, lesion or wound.  Neurological:     Mental Status: He is alert and oriented to person, place, and time.     GCS: GCS eye subscore is 4. GCS verbal subscore is 5. GCS motor subscore is 6.     Cranial Nerves: Cranial nerves 2-12 are intact.     Sensory: Sensation is intact.     Motor: Motor function is intact. No weakness or abnormal muscle tone.     Coordination: Coordination  is intact.  Psychiatric:        Mood and Affect: Mood normal.        Speech: Speech normal.        Behavior: Behavior normal.     ED Results / Procedures / Treatments   Labs (all labs ordered are listed, but only abnormal results are displayed) Labs Reviewed - No data to display  EKG None  Radiology No results found.  Procedures Procedures    Medications Ordered in ED Medications - No data to display  ED Course/ Medical Decision Making/ A&P                                 Medical Decision Making  Presents with leakage from Foley catheter bag as well as tube.  This catheter has been in place for 2 weeks.  Catheter bag replaced.        Final Clinical Impression(s) / ED Diagnoses Final diagnoses:  Foley catheter problem, initial encounter [facility])    Rx / DC Orders ED Discharge Orders     None         Sybrina Laning, Canary Brim, MD 03/15/23 2310

## 2023-03-16 LAB — BMP8+ANION GAP
Anion Gap: 16 mmol/L (ref 10.0–18.0)
BUN/Creatinine Ratio: 11 (ref 10–24)
BUN: 13 mg/dL (ref 8–27)
CO2: 23 mmol/L (ref 20–29)
Calcium: 9.4 mg/dL (ref 8.6–10.2)
Chloride: 103 mmol/L (ref 96–106)
Creatinine, Ser: 1.16 mg/dL (ref 0.76–1.27)
Glucose: 102 mg/dL — ABNORMAL HIGH (ref 70–99)
Potassium: 4.6 mmol/L (ref 3.5–5.2)
Sodium: 142 mmol/L (ref 134–144)
eGFR: 69 mL/min/{1.73_m2} (ref >59)

## 2023-03-16 LAB — IRON,TIBC AND FERRITIN PANEL
Ferritin: 195 ng/mL (ref 30–400)
Iron Saturation: 25 % (ref 15–55)
Iron: 66 ug/dL (ref 38–169)
Total Iron Binding Capacity: 268 ug/dL (ref 250–450)
UIBC: 202 ug/dL (ref 111–343)

## 2023-03-16 LAB — LIPID PANEL
Chol/HDL Ratio: 2.1 ratio (ref 0.0–5.0)
Cholesterol, Total: 154 mg/dL (ref 100–199)
HDL: 74 mg/dL (ref >39)
LDL Chol Calc (NIH): 69 mg/dL (ref 0–99)
Triglycerides: 49 mg/dL (ref 0–149)
VLDL Cholesterol Cal: 11 mg/dL (ref 5–40)

## 2023-03-16 LAB — CBC
Hematocrit: 52.4 % — ABNORMAL HIGH (ref 37.5–51.0)
Hemoglobin: 15.8 g/dL (ref 13.0–17.7)
MCH: 24.8 pg — ABNORMAL LOW (ref 26.6–33.0)
MCHC: 30.2 g/dL — ABNORMAL LOW (ref 31.5–35.7)
MCV: 82 fL (ref 79–97)
Platelets: 181 10*3/uL (ref 150–450)
RBC: 6.38 x10E6/uL — ABNORMAL HIGH (ref 4.14–5.80)
RDW: 17.1 % — ABNORMAL HIGH (ref 11.6–15.4)
WBC: 6.6 10*3/uL (ref 3.4–10.8)

## 2023-03-16 NOTE — Assessment & Plan Note (Signed)
I discussed with him that since he is not having any active anginal symptoms and had only minimal CAD he does not have to see a cardiologist yearly at this time.  We need to continue to work on controlling his blood pressure diabetes cholesterol and ideally quit smoking.  He was appreciative and agreeable to this plan he is doing well on all of those with the exception of quitting smoking.

## 2023-03-16 NOTE — Assessment & Plan Note (Signed)
Continues to have some abnormal tongue movement but not overtly bothered by it.

## 2023-03-16 NOTE — Assessment & Plan Note (Signed)
Recheck lipid panel today LDL is less than 70 at goal.

## 2023-03-16 NOTE — Assessment & Plan Note (Signed)
Blood pressure well controlled no changes to medications.

## 2023-03-16 NOTE — Assessment & Plan Note (Signed)
A1c is less than 7%.  Continue Jardiance 10 mg daily.

## 2023-03-16 NOTE — Assessment & Plan Note (Signed)
Precontemplative for quitting.

## 2023-03-19 ENCOUNTER — Telehealth: Payer: Self-pay

## 2023-03-19 NOTE — Telephone Encounter (Signed)
Transition Care Management Unsuccessful Follow-up Telephone Call  Date of discharge and from where:  Wayne Schmidt 8/31  Attempts:  2nd Attempt  Reason for unsuccessful TCM follow-up call:  No answer/busy     [name] [name]  [name], [facility] Guide, Phone: [phone] Website: [name]

## 2023-03-19 NOTE — Telephone Encounter (Signed)
Transition Care Management Unsuccessful Follow-up Telephone Call  Date of discharge and from where:  Wayne Schmidt 8/31  Attempts:  1st Attempt  Reason for unsuccessful TCM follow-up call:  No answer/busy   [name] [place]  [facility], [facility] Guide, Phone: [phone] Website: [name]

## 2023-03-27 ENCOUNTER — Encounter (HOSPITAL_COMMUNITY): Payer: Self-pay

## 2023-03-27 ENCOUNTER — Ambulatory Visit (HOSPITAL_BASED_OUTPATIENT_CLINIC_OR_DEPARTMENT_OTHER): Payer: 59

## 2023-03-27 ENCOUNTER — Other Ambulatory Visit: Payer: Self-pay

## 2023-03-27 VITALS — BP 137/88 | HR 94 | Ht 72.5 in | Wt 216.0 lb

## 2023-03-27 DIAGNOSIS — F200 Paranoid schizophrenia: Secondary | ICD-10-CM | POA: Diagnosis not present

## 2023-03-27 NOTE — Patient Instructions (Addendum)
DUE TO COVID-19 ONLY TWO VISITORS  (aged 68 and older)  ARE ALLOWED TO COME WITH YOU AND STAY IN THE WAITING ROOM ONLY DURING PRE OP AND PROCEDURE.   **NO VISITORS ARE ALLOWED IN THE SHORT STAY AREA OR RECOVERY ROOM!!**  IF YOU WILL BE ADMITTED INTO THE HOSPITAL YOU ARE ALLOWED ONLY FOUR SUPPORT PEOPLE DURING VISITATION HOURS ONLY (7 AM -8PM)   The support person(s) must pass our screening, gel in and out, and wear a mask at all times, including in the patient's room. Patients must also wear a mask when staff or their support person are in the room. Visitors GUEST BADGE MUST BE WORN VISIBLY  One adult visitor may remain with you overnight and MUST be in the room by 8 P.M.     Your procedure is scheduled on: 04/13/23   Report to [facility] Main Entrance    Report to admitting at : 9:15 AM   Call this number if you have problems the morning of surgery [phone]   Do not eat food or drink: After Midnight.  FOLLOW ANY ADDITIONAL PRE OP INSTRUCTIONS YOU RECEIVED FROM YOUR SURGEON'S OFFICE!!!   Oral Hygiene is also important to reduce your risk of infection.                                    Remember - BRUSH YOUR TEETH THE MORNING OF SURGERY WITH YOUR REGULAR TOOTHPASTE  DENTURES WILL BE REMOVED PRIOR TO SURGERY PLEASE DO NOT APPLY "Poly grip" OR ADHESIVES!!!   Do NOT smoke after Midnight   Take these medicines the morning of surgery with A SIP OF WATER: doxazosin.  DO NOT TAKE JARDIANCE AFTER: 04/09/23  Bring CPAP mask and tubing day of surgery.                              You may not have any metal on your body including hair pins, jewelry, and body piercing             Do not wear lotions, powders, perfumes/cologne, or deodorant              Men may shave face and neck.   Do not bring valuables to the hospital. [facility] IS NOT             RESPONSIBLE   FOR VALUABLES.   Contacts, glasses, or bridgework may not be worn into surgery.   Bring small overnight  bag day of surgery.   DO NOT BRING YOUR HOME MEDICATIONS TO THE HOSPITAL. PHARMACY WILL DISPENSE MEDICATIONS LISTED ON YOUR MEDICATION LIST TO YOU DURING YOUR ADMISSION IN THE HOSPITAL!    Patients discharged on the day of surgery will not be allowed to drive home.  Someone NEEDS to stay with you for the first 24 hours after anesthesia.   Special Instructions: Bring a copy of your healthcare power of attorney and living will documents         the day of surgery if you haven't scanned them before.              Please read over the following fact sheets you were given: IF YOU HAVE QUESTIONS ABOUT YOUR PRE-OP INSTRUCTIONS PLEASE CALL [phone]    [facility] - Preparing for Surgery Before surgery, you can play an important role.  Because skin is not  sterile, your skin needs to be as free of germs as possible.  You can reduce the number of germs on your skin by washing with CHG (chlorahexidine gluconate) soap before surgery.  CHG is an antiseptic cleaner which kills germs and bonds with the skin to continue killing germs even after washing. Please DO NOT use if you have an allergy to CHG or antibacterial soaps.  If your skin becomes reddened/irritated stop using the CHG and inform your nurse when you arrive at Short Stay. Do not shave (including legs and underarms) for at least 48 hours prior to the first CHG shower.  You may shave your face/neck. Please follow these instructions carefully:  1.  Shower with CHG Soap the night before surgery and the  morning of Surgery.  2.  If you choose to wash your hair, wash your hair first as usual with your  normal  shampoo.  3.  After you shampoo, rinse your hair and body thoroughly to remove the  shampoo.                           4.  Use CHG as you would any other liquid soap.  You can apply chg directly  to the skin and wash                       Gently with a scrungie or clean washcloth.  5.  Apply the CHG Soap to your body ONLY FROM THE NECK DOWN.   Do  not use on face/ open                           Wound or open sores. Avoid contact with eyes, ears mouth and genitals (private parts).                       Wash face,  Genitals (private parts) with your normal soap.             6.  Wash thoroughly, paying special attention to the area where your surgery  will be performed.  7.  Thoroughly rinse your body with warm water from the neck down.  8.  DO NOT shower/wash with your normal soap after using and rinsing off  the CHG Soap.                9.  Pat yourself dry with a clean towel.            10.  Wear clean pajamas.            11.  Place clean sheets on your bed the night of your first shower and do not  sleep with pets. Day of Surgery : Do not apply any lotions/deodorants the morning of surgery.  Please wear clean clothes to the hospital/surgery center.  FAILURE TO FOLLOW THESE INSTRUCTIONS MAY RESULT IN THE CANCELLATION OF YOUR SURGERY PATIENT SIGNATURE_________________________________  NURSE SIGNATURE__________________________________  ________________________________________________________________________

## 2023-03-27 NOTE — Progress Notes (Signed)
Patient arrived today for his due injection of Fluphenazine Decanoate 125 mg/60mL. Patient was pleasant and well groomed. Patient seems much better today, his conversation is coherent. Injection was prepared as ordered and administered in patients right deltoid. Patient tolerated well and without complaint. He has no questions or concerns at this time and will return in 2 weeks for next due injection  NDC: 32440-102-72 LOT: [id number] Exp: MAY 2025

## 2023-03-28 ENCOUNTER — Other Ambulatory Visit: Payer: Self-pay

## 2023-03-28 ENCOUNTER — Encounter (HOSPITAL_COMMUNITY): Payer: Self-pay

## 2023-03-28 ENCOUNTER — Encounter (HOSPITAL_COMMUNITY)
Admission: RE | Admit: 2023-03-28 | Discharge: 2023-03-28 | Disposition: A | Discharge status: Home / Self Care | Payer: 59 | Source: Ambulatory Visit | Attending: Urology | Admitting: Urology

## 2023-03-28 VITALS — BP 109/71 | HR 76 | Temp 98.6°F | Ht 73.0 in | Wt 215.0 lb

## 2023-03-28 DIAGNOSIS — E1169 Type 2 diabetes mellitus with other specified complication: Secondary | ICD-10-CM | POA: Diagnosis not present

## 2023-03-28 DIAGNOSIS — Z01812 Encounter for preprocedural laboratory examination: Secondary | ICD-10-CM | POA: Insufficient documentation

## 2023-03-28 HISTORY — DX: Angina pectoris, unspecified: I20.9

## 2023-03-28 LAB — GLUCOSE, CAPILLARY: Glucose-Capillary: 143 mg/dL — ABNORMAL HIGH (ref 70–99)

## 2023-03-28 NOTE — Progress Notes (Signed)
For Short Stay: COVID SWAB appointment date:  Bowel Prep reminder:   For Anesthesia: PCP - Gust Rung, DO . LOV: 03/15/23 Cardiologist - Corky Crafts, MD . LOV: 05/06/21  Chest x-ray -  EKG - 12/01/22 Stress Test -  ECHO -  Cardiac Cath -  CT Coronary: 05/30/21 Pacemaker/ICD device last checked: Pacemaker orders received: Device Rep notified:  Spinal Cord Stimulator: N/A  Sleep Study - N/A CPAP -   Fasting Blood Sugar - N/A Checks Blood Sugar ___0__ times a day Date and result of last Hgb A1c-6.9: 03/15/23  Last dose of GLP1 agonist- N/A GLP1 instructions:   Last dose of SGLT-2 inhibitors- Jardiance: on hold after: 04/09/23. SGLT-2 instructions:   Blood Thinner Instructions: Aspirin Instructions: Last Dose:  Activity level: Can go up a flight of stairs and activities of daily living without stopping and without chest pain and/or shortness of breath   Able to exercise without chest pain and/or shortness of breath  Anesthesia review: Hx: HTN,DIA,Smoker,Chest pain.  Patient denies shortness of breath, fever, cough and chest pain at PAT appointment   Patient verbalized understanding of instructions that were given to them at the PAT appointment. Patient was also instructed that they will need to review over the PAT instructions again at home before surgery.

## 2023-04-02 ENCOUNTER — Emergency Department (HOSPITAL_COMMUNITY)
Admission: EM | Admit: 2023-04-02 | Discharge: 2023-04-02 | Disposition: A | Discharge status: Home / Self Care | Payer: 59 | Attending: Emergency Medicine | Admitting: Emergency Medicine

## 2023-04-02 DIAGNOSIS — I10 Essential (primary) hypertension: Secondary | ICD-10-CM | POA: Insufficient documentation

## 2023-04-02 DIAGNOSIS — Z79899 Other long term (current) drug therapy: Secondary | ICD-10-CM | POA: Insufficient documentation

## 2023-04-02 DIAGNOSIS — Z978 Presence of other specified devices: Secondary | ICD-10-CM

## 2023-04-02 DIAGNOSIS — Z7984 Long term (current) use of oral hypoglycemic drugs: Secondary | ICD-10-CM | POA: Diagnosis not present

## 2023-04-02 DIAGNOSIS — E119 Type 2 diabetes mellitus without complications: Secondary | ICD-10-CM | POA: Diagnosis not present

## 2023-04-02 DIAGNOSIS — Z466 Encounter for fitting and adjustment of urinary device: Secondary | ICD-10-CM | POA: Insufficient documentation

## 2023-04-02 DIAGNOSIS — N390 Urinary tract infection, site not specified: Secondary | ICD-10-CM | POA: Diagnosis not present

## 2023-04-02 NOTE — ED Notes (Signed)
Pt reports he is leaving AMA , saw walking from ED to lobby by Triage techs

## 2023-04-02 NOTE — Discharge Instructions (Addendum)
You were seen in the emergency room for Foley catheter bag leaking.   Given that you are having no other symptoms and problem resolved after changing catheter bag, please follow-up after today's outpatient if needed  Return to the emergency room with any new or worsening symptoms.

## 2023-04-02 NOTE — ED Notes (Signed)
Pt provided new leg bag for complaint of leg bag leaking

## 2023-04-02 NOTE — ED Provider Notes (Signed)
[place] EMERGENCY DEPARTMENT AT [facility] Provider Note   CSN: [id number] Arrival date & time: 04/02/23  2043     History  No chief complaint on file.   Kashmir Lysaght is a 67 y.o. male with hypertension, urinary tract infection, enlarged prostate, diabetes presenting to the emergency room with Foley catheter bag leaking.  RT seeing patient catheter bag was replaced.  And problem resolved.  Patient reports he is ready to go.  Denies any abdominal pain, blood in his urine chest pain shortness of breath.  HPI     Home Medications Prior to Admission medications   Medication Sig Start Date End Date Taking? Authorizing Provider  clotrimazole (LOTRIMIN) 1 % cream Apply 1 Application topically 2 (two) times daily. Patient taking differently: Apply 1 Application topically 2 (two) times daily as needed (athletes foot). 11/23/22   Gust Rung, DO  doxazosin (CARDURA) 2 MG tablet TAKE 1 TABLET BY MOUTH ONCE DAILY 03/08/23   Gust Rung, DO  empagliflozin (JARDIANCE) 10 MG TABS tablet Take 1 tablet (10 mg total) by mouth daily before breakfast. 08/25/22   Gust Rung, DO  fluPHENAZine decanoate (PROLIXIN) 25 MG/ML injection Inject 1 mL (25 mg total) into the muscle every 14 (fourteen) days. 11/08/22   Arfeen, Phillips Grout, MD  fluticasone (FLONASE) 50 MCG/ACT nasal spray Place 1 spray into both nostrils daily. Patient taking differently: Place 1 spray into both nostrils daily as needed for allergies. 08/24/22   Gust Rung, DO  losartan (COZAAR) 100 MG tablet TAKE 1 TABLET BY MOUTH ONCE DAILY 02/28/23   Gust Rung, DO  rosuvastatin (CRESTOR) 20 MG tablet TAKE 1 TABLET BY MOUTH ONCE DAILY 02/28/23   Gust Rung, DO      Allergies    Ace inhibitors, Atorvastatin, Chlorpromazine hcl, Metformin and related, and Simvastatin    Review of Systems   Review of Systems  Genitourinary:        Urine bag leaking    Physical Exam Updated Vital Signs BP (!) 148/92 (BP  Location: Left Arm)   Pulse 74   Temp 98.3 F (36.8 C) (Oral)   Resp 16   SpO2 100%  Physical Exam Vitals and nursing note reviewed.  Constitutional:      General: He is not in acute distress.    Appearance: He is not toxic-appearing.  HENT:     Head: Normocephalic and atraumatic.  Eyes:     General: No scleral icterus.    Conjunctiva/sclera: Conjunctivae normal.  Cardiovascular:     Rate and Rhythm: Normal rate and regular rhythm.     Pulses: Normal pulses.     Heart sounds: Normal heart sounds.  Pulmonary:     Effort: Pulmonary effort is normal. No respiratory distress.     Breath sounds: Normal breath sounds.  Abdominal:     General: Abdomen is flat. Bowel sounds are normal.     Palpations: Abdomen is soft.     Tenderness: There is no abdominal tenderness.  Skin:    General: Skin is warm and dry.     Findings: No lesion.  Neurological:     General: No focal deficit present.     Mental Status: He is alert and oriented to person, place, and time. Mental status is at baseline.     ED Results / Procedures / Treatments   Labs (all labs ordered are listed, but only abnormal results are displayed) Labs Reviewed - No data to display  EKG None  Radiology No results found.  Procedures Procedures    Medications Ordered in ED Medications - No data to display  ED Course/ Medical Decision Making/ A&P                                 Medical Decision Making  This patient presents to the ED for concern of leaking foley cath urine bag, this involves an extensive number of treatment options, and is a complaint that carries with it a high risk of complications and morbidity.     Cardiac Monitoring: / EKG:  The patient was maintained on a cardiac monitor.     Consultations Obtained:  None   Problem List / ED Course / Critical interventions / Medication management  Reports emergency room for urine bag leaking.  Patient reports she has had an issue with this in  the past.  Requesting new Foley bag.  After catheter bag was replaced issue resolved.  Patient is having no other symptoms or concern at this time.  Hemodynamically stable discussed follow-up.  Patient reports he is having surgery for this condition next month.  Requesting no further workup and ready to go home.   Reevaluation of the patient after these medicines showed that the patient improved I have reviewed the patients home medicines and have made adjustments as needed   Plan F/u w/ PCP in 2-3d to ensure resolution of sx.  Patient was given return precautions. Patient stable for discharge at this time.  Patient educated on sx/dx and verbalized understanding of plan. Return to ER w/ new or worsening sx.          Final Clinical Impression(s) / ED Diagnoses Final diagnoses:  None    Rx / DC Orders ED Discharge Orders     None         Raford Pitcher Horald Chestnut, PA-C 04/02/23 2158    Royanne Foots, DO 04/09/23 2020

## 2023-04-02 NOTE — ED Triage Notes (Signed)
Pt reports he believes catheter bag is leaking, denies pain and states that leakage is from bag. Pt requesting new leg bag

## 2023-04-10 ENCOUNTER — Other Ambulatory Visit: Payer: Self-pay

## 2023-04-10 ENCOUNTER — Ambulatory Visit (HOSPITAL_BASED_OUTPATIENT_CLINIC_OR_DEPARTMENT_OTHER): Payer: 59

## 2023-04-10 ENCOUNTER — Encounter (HOSPITAL_COMMUNITY): Payer: Self-pay

## 2023-04-10 DIAGNOSIS — G2401 Drug induced subacute dyskinesia: Secondary | ICD-10-CM

## 2023-04-10 DIAGNOSIS — F200 Paranoid schizophrenia: Secondary | ICD-10-CM

## 2023-04-10 MED ORDER — FLUPHENAZINE DECANOATE 25 MG/ML IJ SOLN: 25.0000 mg | INTRAMUSCULAR | 2 refills | Status: DC

## 2023-04-10 NOTE — Progress Notes (Signed)
Patient arrived today for his due injection of Fluphenazine Dec 125mg / 5mL. Patient is pleasant and well groomed. Patient is doing well and is loving the cooler weather. Injection was prepared as ordered and administered in patients Left Deltoid. Patient tolerated well and without complaint and will return in 2 weeks for his next due injection.  NDC: 40981-191-47 LOT: [id number]  EXP: MAY 2025

## 2023-04-13 ENCOUNTER — Ambulatory Visit (HOSPITAL_BASED_OUTPATIENT_CLINIC_OR_DEPARTMENT_OTHER): Payer: 59 | Admitting: Physician Assistant

## 2023-04-13 ENCOUNTER — Ambulatory Visit (HOSPITAL_COMMUNITY): Payer: 59 | Admitting: Physician Assistant

## 2023-04-13 ENCOUNTER — Encounter (HOSPITAL_COMMUNITY)
Admission: RE | Disposition: A | Discharge status: Home / Self Care | Payer: Self-pay | Source: Ambulatory Visit | Attending: Urology

## 2023-04-13 ENCOUNTER — Other Ambulatory Visit: Payer: Self-pay

## 2023-04-13 ENCOUNTER — Observation Stay (HOSPITAL_COMMUNITY)
Admission: RE | Admit: 2023-04-13 | Discharge: 2023-04-14 | Disposition: A | Discharge status: Home / Self Care | Payer: 59 | Source: Ambulatory Visit | Attending: Urology | Admitting: Urology

## 2023-04-13 ENCOUNTER — Encounter (HOSPITAL_COMMUNITY): Payer: Self-pay | Admitting: Urology

## 2023-04-13 DIAGNOSIS — I2510 Atherosclerotic heart disease of native coronary artery without angina pectoris: Secondary | ICD-10-CM | POA: Diagnosis not present

## 2023-04-13 DIAGNOSIS — N401 Enlarged prostate with lower urinary tract symptoms: Principal | ICD-10-CM | POA: Insufficient documentation

## 2023-04-13 DIAGNOSIS — F17210 Nicotine dependence, cigarettes, uncomplicated: Secondary | ICD-10-CM | POA: Insufficient documentation

## 2023-04-13 DIAGNOSIS — Z79899 Other long term (current) drug therapy: Secondary | ICD-10-CM | POA: Diagnosis not present

## 2023-04-13 DIAGNOSIS — R3912 Poor urinary stream: Secondary | ICD-10-CM | POA: Insufficient documentation

## 2023-04-13 DIAGNOSIS — R339 Retention of urine, unspecified: Secondary | ICD-10-CM | POA: Diagnosis not present

## 2023-04-13 DIAGNOSIS — I10 Essential (primary) hypertension: Secondary | ICD-10-CM | POA: Insufficient documentation

## 2023-04-13 DIAGNOSIS — R350 Frequency of micturition: Secondary | ICD-10-CM | POA: Insufficient documentation

## 2023-04-13 DIAGNOSIS — E119 Type 2 diabetes mellitus without complications: Secondary | ICD-10-CM | POA: Insufficient documentation

## 2023-04-13 DIAGNOSIS — N400 Enlarged prostate without lower urinary tract symptoms: Principal | ICD-10-CM | POA: Diagnosis present

## 2023-04-13 LAB — HEMOGLOBIN AND HEMATOCRIT, BLOOD
HCT: 47.7 % (ref 39.0–52.0)
Hemoglobin: 14.6 g/dL (ref 13.0–17.0)

## 2023-04-13 LAB — GLUCOSE, CAPILLARY
Glucose-Capillary: 159 mg/dL — ABNORMAL HIGH (ref 70–99)
Glucose-Capillary: 135 mg/dL — ABNORMAL HIGH (ref 70–99)
Glucose-Capillary: 270 mg/dL — ABNORMAL HIGH (ref 70–99)

## 2023-04-13 SURGERY — ABLATION, PROSTATE, TRANSURETHRAL, USING WATERJET
Anesthesia: General | Site: Prostate

## 2023-04-13 MED ORDER — SODIUM CHLORIDE 0.9 % IR SOLN
3000.0000 mL | Status: DC
  Administered 2023-04-13 – 2023-04-14 (×8): 3000 mL

## 2023-04-13 MED ORDER — EMPAGLIFLOZIN 10 MG PO TABS
10.0000 mg | ORAL_TABLET | Freq: Every day | ORAL | Status: DC
  Administered 2023-04-14: 10 mg via ORAL
  Filled 2023-04-13: qty 1

## 2023-04-13 MED ORDER — INSULIN ASPART 100 UNIT/ML IJ SOLN
0.0000 [IU] | Freq: Three times a day (TID) | INTRAMUSCULAR | Status: DC
  Administered 2023-04-14: 2 [IU] via SUBCUTANEOUS
  Administered 2023-04-14: 3 [IU] via SUBCUTANEOUS

## 2023-04-13 MED ORDER — DOCUSATE SODIUM 100 MG PO CAPS
100.0000 mg | ORAL_CAPSULE | Freq: Two times a day (BID) | ORAL | Status: DC
  Administered 2023-04-13 – 2023-04-14 (×2): 100 mg via ORAL
  Filled 2023-04-13 (×2): qty 1

## 2023-04-13 MED ORDER — TRANEXAMIC ACID-NACL 1000-0.7 MG/100ML-% IV SOLN
1000.0000 mg | Freq: Once | INTRAVENOUS | Status: DC
  Filled 2023-04-13: qty 100

## 2023-04-13 MED ORDER — OXYCODONE HCL 5 MG PO TABS
5.0000 mg | ORAL_TABLET | Freq: Once | ORAL | Status: AC | PRN
  Administered 2023-04-13: 5 mg via ORAL

## 2023-04-13 MED ORDER — HYDROCODONE-ACETAMINOPHEN 5-325 MG PO TABS: 1.0000 | ORAL_TABLET | ORAL | 0 refills | Status: DC | PRN

## 2023-04-13 MED ORDER — CEFAZOLIN SODIUM-DEXTROSE 2-4 GM/100ML-% IV SOLN
2.0000 g | Freq: Three times a day (TID) | INTRAVENOUS | Status: DC
  Administered 2023-04-13 – 2023-04-14 (×2): 2 g via INTRAVENOUS
  Filled 2023-04-13 (×3): qty 100

## 2023-04-13 MED ORDER — LACTATED RINGERS IV SOLN: INTRAVENOUS | Status: DC

## 2023-04-13 MED ORDER — LIDOCAINE 2% (20 MG/ML) 5 ML SYRINGE
INTRAMUSCULAR | Status: DC | PRN
  Administered 2023-04-13: 100 mg via INTRAVENOUS

## 2023-04-13 MED ORDER — OXYCODONE HCL 5 MG PO TABS
5.0000 mg | ORAL_TABLET | ORAL | Status: DC | PRN
  Administered 2023-04-14: 5 mg via ORAL
  Filled 2023-04-13 (×2): qty 1

## 2023-04-13 MED ORDER — MIDAZOLAM HCL 2 MG/2ML IJ SOLN
INTRAMUSCULAR | Status: AC
  Filled 2023-04-13: qty 2

## 2023-04-13 MED ORDER — FENTANYL CITRATE (PF) 100 MCG/2ML IJ SOLN
INTRAMUSCULAR | Status: AC
  Filled 2023-04-13: qty 2

## 2023-04-13 MED ORDER — CEFAZOLIN SODIUM-DEXTROSE 2-4 GM/100ML-% IV SOLN
2.0000 g | INTRAVENOUS | Status: AC
  Administered 2023-04-13: 2 g via INTRAVENOUS
  Filled 2023-04-13: qty 100

## 2023-04-13 MED ORDER — MIDAZOLAM HCL 5 MG/5ML IJ SOLN
INTRAMUSCULAR | Status: DC | PRN
  Administered 2023-04-13: 2 mg via INTRAVENOUS

## 2023-04-13 MED ORDER — ROSUVASTATIN CALCIUM 20 MG PO TABS
20.0000 mg | ORAL_TABLET | Freq: Every day | ORAL | Status: DC
  Administered 2023-04-13 – 2023-04-14 (×2): 20 mg via ORAL
  Filled 2023-04-13 (×2): qty 1

## 2023-04-13 MED ORDER — DEXAMETHASONE SODIUM PHOSPHATE 10 MG/ML IJ SOLN
INTRAMUSCULAR | Status: AC
  Filled 2023-04-13: qty 1

## 2023-04-13 MED ORDER — PHENYLEPHRINE 80 MCG/ML (10ML) SYRINGE FOR IV PUSH (FOR BLOOD PRESSURE SUPPORT)
PREFILLED_SYRINGE | INTRAVENOUS | Status: DC | PRN
  Administered 2023-04-13 (×2): 160 ug via INTRAVENOUS
  Administered 2023-04-13: 120 ug via INTRAVENOUS

## 2023-04-13 MED ORDER — AMOXICILLIN-POT CLAVULANATE 875-125 MG PO TABS: 1.0000 | ORAL_TABLET | Freq: Two times a day (BID) | ORAL | 0 refills | Status: DC

## 2023-04-13 MED ORDER — SUGAMMADEX SODIUM 200 MG/2ML IV SOLN
INTRAVENOUS | Status: DC | PRN
  Administered 2023-04-13: 200 mg via INTRAVENOUS

## 2023-04-13 MED ORDER — FENTANYL CITRATE PF 50 MCG/ML IJ SOSY
25.0000 ug | PREFILLED_SYRINGE | INTRAMUSCULAR | Status: DC | PRN
  Administered 2023-04-13 (×2): 50 ug via INTRAVENOUS

## 2023-04-13 MED ORDER — LOSARTAN POTASSIUM 50 MG PO TABS: 100.0000 mg | ORAL_TABLET | Freq: Every day | ORAL | Status: DC

## 2023-04-13 MED ORDER — FENTANYL CITRATE PF 50 MCG/ML IJ SOSY
PREFILLED_SYRINGE | INTRAMUSCULAR | Status: AC
  Filled 2023-04-13: qty 1

## 2023-04-13 MED ORDER — MORPHINE SULFATE (PF) 2 MG/ML IV SOLN: 2.0000 mg | INTRAVENOUS | Status: DC | PRN

## 2023-04-13 MED ORDER — ACETAMINOPHEN 10 MG/ML IV SOLN: 1000.0000 mg | Freq: Once | INTRAVENOUS | Status: DC | PRN

## 2023-04-13 MED ORDER — OXYCODONE HCL 5 MG PO TABS
ORAL_TABLET | ORAL | Status: AC
  Filled 2023-04-13: qty 1

## 2023-04-13 MED ORDER — TRANEXAMIC ACID-NACL 1000-0.7 MG/100ML-% IV SOLN
1000.0000 mg | INTRAVENOUS | Status: AC
  Administered 2023-04-13: 1000 mg via INTRAVENOUS
  Filled 2023-04-13: qty 100

## 2023-04-13 MED ORDER — ACETAMINOPHEN 325 MG PO TABS: 650.0000 mg | ORAL_TABLET | ORAL | Status: DC | PRN

## 2023-04-13 MED ORDER — DIPHENHYDRAMINE HCL 50 MG/ML IJ SOLN: 12.5000 mg | Freq: Four times a day (QID) | INTRAMUSCULAR | Status: DC | PRN

## 2023-04-13 MED ORDER — ONDANSETRON HCL 4 MG/2ML IJ SOLN
INTRAMUSCULAR | Status: AC
  Filled 2023-04-13: qty 2

## 2023-04-13 MED ORDER — TRIPLE ANTIBIOTIC 3.5-400-5000 EX OINT: 1.0000 | TOPICAL_OINTMENT | Freq: Three times a day (TID) | CUTANEOUS | Status: DC | PRN

## 2023-04-13 MED ORDER — DEXAMETHASONE SODIUM PHOSPHATE 4 MG/ML IJ SOLN
INTRAMUSCULAR | Status: DC | PRN
  Administered 2023-04-13: 5 mg via INTRAVENOUS

## 2023-04-13 MED ORDER — INSULIN ASPART 100 UNIT/ML IJ SOLN: 0.0000 [IU] | INTRAMUSCULAR | Status: DC | PRN

## 2023-04-13 MED ORDER — OXYCODONE HCL 5 MG/5ML PO SOLN: 5.0000 mg | Freq: Once | ORAL | Status: AC | PRN

## 2023-04-13 MED ORDER — LOSARTAN POTASSIUM 50 MG PO TABS
100.0000 mg | ORAL_TABLET | Freq: Every day | ORAL | Status: DC
  Administered 2023-04-13 – 2023-04-14 (×2): 100 mg via ORAL
  Filled 2023-04-13 (×2): qty 2

## 2023-04-13 MED ORDER — ONDANSETRON HCL 4 MG/2ML IJ SOLN: 4.0000 mg | INTRAMUSCULAR | Status: DC | PRN

## 2023-04-13 MED ORDER — DROPERIDOL 2.5 MG/ML IJ SOLN: 0.6250 mg | Freq: Once | INTRAMUSCULAR | Status: DC | PRN

## 2023-04-13 MED ORDER — FENTANYL CITRATE (PF) 100 MCG/2ML IJ SOLN
INTRAMUSCULAR | Status: DC | PRN
  Administered 2023-04-13: 100 ug via INTRAVENOUS
  Administered 2023-04-13 (×2): 50 ug via INTRAVENOUS

## 2023-04-13 MED ORDER — ORAL CARE MOUTH RINSE: 15.0000 mL | Freq: Once | OROMUCOSAL | Status: AC

## 2023-04-13 MED ORDER — EPHEDRINE SULFATE-NACL 50-0.9 MG/10ML-% IV SOSY
PREFILLED_SYRINGE | INTRAVENOUS | Status: DC | PRN
  Administered 2023-04-13: 5 mg via INTRAVENOUS

## 2023-04-13 MED ORDER — DOXAZOSIN MESYLATE 2 MG PO TABS
2.0000 mg | ORAL_TABLET | Freq: Every day | ORAL | Status: DC
  Administered 2023-04-14: 2 mg via ORAL
  Filled 2023-04-13: qty 1

## 2023-04-13 MED ORDER — ROCURONIUM BROMIDE 10 MG/ML (PF) SYRINGE
PREFILLED_SYRINGE | INTRAVENOUS | Status: DC | PRN
  Administered 2023-04-13: 70 mg via INTRAVENOUS

## 2023-04-13 MED ORDER — DIPHENHYDRAMINE HCL 12.5 MG/5ML PO ELIX: 12.5000 mg | ORAL_SOLUTION | Freq: Four times a day (QID) | ORAL | Status: DC | PRN

## 2023-04-13 MED ORDER — SODIUM CHLORIDE 0.9 % IR SOLN
Status: DC | PRN
  Administered 2023-04-13 (×5): 3000 mL

## 2023-04-13 MED ORDER — CHLORHEXIDINE GLUCONATE 0.12 % MT SOLN
15.0000 mL | Freq: Once | OROMUCOSAL | Status: AC
  Administered 2023-04-13: 15 mL via OROMUCOSAL

## 2023-04-13 MED ORDER — ZOLPIDEM TARTRATE 5 MG PO TABS: 5.0000 mg | ORAL_TABLET | Freq: Every evening | ORAL | Status: DC | PRN

## 2023-04-13 MED ORDER — ONDANSETRON HCL 4 MG/2ML IJ SOLN
INTRAMUSCULAR | Status: DC | PRN
  Administered 2023-04-13: 4 mg via INTRAVENOUS

## 2023-04-13 MED ORDER — LIDOCAINE HCL (PF) 2 % IJ SOLN
INTRAMUSCULAR | Status: AC
  Filled 2023-04-13: qty 5

## 2023-04-13 MED ORDER — SENNA 8.6 MG PO TABS
1.0000 | ORAL_TABLET | Freq: Two times a day (BID) | ORAL | Status: DC
  Administered 2023-04-13 – 2023-04-14 (×2): 8.6 mg via ORAL
  Filled 2023-04-13 (×2): qty 1

## 2023-04-13 MED ORDER — PROPOFOL 10 MG/ML IV BOLUS
INTRAVENOUS | Status: DC | PRN
  Administered 2023-04-13: 150 mg via INTRAVENOUS

## 2023-04-13 SURGICAL SUPPLY — 27 items
BAG DRN RND TRDRP ANRFLXCHMBR (UROLOGICAL SUPPLIES) ×1
BAG URINE DRAIN 2000ML AR STRL (UROLOGICAL SUPPLIES) ×1 IMPLANT
CANISTER SUCT 3000ML PPV (MISCELLANEOUS) ×1 IMPLANT
CATH HEMA 3WAY 30CC 22FR COUDE (CATHETERS) IMPLANT
CATH HEMA 3WAY 30CC 24FR COUDE (CATHETERS) IMPLANT
COVER MAYO STAND STRL (DRAPES) ×1 IMPLANT
DRAPE FOOT SWITCH (DRAPES) ×1 IMPLANT
GEL ULTRASOUND 8.5O AQUASONIC (MISCELLANEOUS) ×1 IMPLANT
GLOVE SURG LX STRL 7.5 STRW (GLOVE) ×1 IMPLANT
GOWN STRL REUS W/ TWL XL LVL3 (GOWN DISPOSABLE) ×1 IMPLANT
GOWN STRL REUS W/TWL XL LVL3 (GOWN DISPOSABLE) ×1
HANDPIECE AQUABEAM (MISCELLANEOUS) ×1 IMPLANT
HOLDER FOLEY CATH W/STRAP (MISCELLANEOUS) IMPLANT
KIT TURNOVER KIT A (KITS) IMPLANT
LOOP CUT BIPOLAR 24F LRG (ELECTROSURGICAL) IMPLANT
MANIFOLD NEPTUNE II (INSTRUMENTS) ×1 IMPLANT
MAT ABSORB FLUID 56X50 GRAY (MISCELLANEOUS) ×1 IMPLANT
PACK CYSTO (CUSTOM PROCEDURE TRAY) ×1 IMPLANT
PACK DRAPE AQUABEAM (MISCELLANEOUS) ×1 IMPLANT
PAD PREP 24X48 CUFFED NSTRL (MISCELLANEOUS) ×1 IMPLANT
SYR 30ML LL (SYRINGE) ×1 IMPLANT
SYR TOOMEY IRRIG 70ML (MISCELLANEOUS) ×2
SYRINGE TOOMEY IRRIG 70ML (MISCELLANEOUS) ×2 IMPLANT
TOWEL OR 17X26 10 PK STRL BLUE (TOWEL DISPOSABLE) ×1 IMPLANT
TUBING CONNECTING 10 (TUBING) ×2 IMPLANT
TUBING UROLOGY SET (TUBING) ×1 IMPLANT
UNDERPAD 30X36 HEAVY ABSORB (UNDERPADS AND DIAPERS) ×1 IMPLANT

## 2023-04-13 NOTE — Op Note (Signed)
Preoperative diagnosis: BPH with lower urinary tract symptoms, weak stream, frequency  Postoperative diagnosis: Same   Procedure: Robotic water jet ablation of the prostate   Surgeon: Modena Slater, MD  Anesthesia: General   Indication for procedure:   Findings:  Cystoscopy revealed trilobar hypertrophy with significant obstruction.  Wide open channel after ablation  Description of procedure:  He was brought to the operating room and placed supine on the operating table.  After adequate anesthesia he was placed lithotomy position. Timeout was performed to confirm the patient and procedure. The TRUS Stepper was mounted to the Articulating Arm and secured to OR bed. The ultrasound probe was attached to the stepper. Exam under anesthesia was performed and the TRUS was inserted per rectum.  There was no resistance. The ultrasound probe was aligned, and confirmation made that the prostate is centered and aligned using both transverse and sagittal views. The bladder neck, verumontanum and the central/transition zones were identified.  Genitalia were prepped and draped in the usual sterile fashion. The 59F AQUABEAM Handpiece is inserted into the prostatic urethra and a complete cystoscopic evaluation was performed by inspecting the prostate, bladder, and identifying the location of the verumontanum/external sphincter. The AQUABEAM Handpiece was secured to the Handpiece Articulating Arm. Confirmed alignment of AQUABEAM Handpiece and TRUS Probe to be parallel and colinear. Confirmation that AQUABEAM nozzle is centered and anterior of the bladder neck or the median lobe. The cystoscope was then retracted to visualize the verumontanum and external sphincter and the cystoscope tip was positioned just proximal to the external sphincter. Reconfirmed alignment of the TRUS probe with the AQUABEAM Handpiece and compression applied with TRUS probe. Horizontal alignment of the Handpiece waterjet nozzle was performed. The  Aquablation treatment zones were planned utilizing real-time TRUS to visualize the contour of the prostate and the depth and radial angles of resection were defined in the transverse view. In the sagittal view, the AQUABEAM nozzle is identified and position registered with software. The treatment contours were then adjusted to conform to the intended resection margins. The median lobe, bladder neck and verumontanum were marked and confirmed in the treatment contour. The Aquablation Treatment was then started following the resection contour confirmed under ultrasound guidance.  TOTAL AQUABLATION RESECTION TIME: 9 minutes and 30 seconds  Once Aquablation resection was complete the 24 French aqua beam handpiece was carefully removed.  The continuous-flow sheath with the visual obturator was passed and then the loop and handle.  The trigone and the ureteral orifices were identified.  Resection of some of the residual median lobe and bladder neck tissue was done.  The bladder neck was identified at 6:00 and this was taken up to 12:00 with fulguration of the bladder neck and prostate for hemostasis.  Similarly from 6:00 up to 12:00 on the left side of the bladder neck was identified by resecting some of the ablated tissue to identify the bladder neck and cauterize any bleeding.  Some anterior tissue and lateral tissue was resected.  This created excellent hemostasis.  All the chips were evacuated.  Ureteral orifices again identified and noted to be normal without injury.  The scope was backed out and a 24 [place] hematuria catheter was placed with 30 cc in the balloon.  The balloon was seated at the bladder neck and it was irrigated on light traction and noted to be clear to pink.  He was hooked up to CBI.  He was cleaned up and placed supine.  Catheter was placed on traction.  He  was awakened and taken to the cover room in stable condition.  Complications: None  Blood loss: 200 mL  Specimens: None  Drains: 24  French three-way hematuria catheter with 30 cc in the balloon  Disposition: Patient stable to PACU

## 2023-04-13 NOTE — Transfer of Care (Signed)
Immediate Anesthesia Transfer of Care Note  Patient: Wayne Schmidt  Procedure(s) Performed: TRANSURETHRAL WATERJET ABLATION OF PROSTATE (Prostate)  Patient Location: PACU  Anesthesia Type:General  Level of Consciousness: awake and patient cooperative  Airway & Oxygen Therapy: Patient Spontanous Breathing and Patient connected to face mask  Post-op Assessment: Report given to RN and Post -op Vital signs reviewed and stable  Post vital signs: Reviewed and stable  Last Vitals:  Vitals Value Taken Time  BP 124/92 04/13/23 1255  Temp    Pulse 80 04/13/23 1258  Resp 14 04/13/23 1258  SpO2 97 % 04/13/23 1258  Vitals shown include unfiled device data.  Last Pain:  Vitals:   04/13/23 0948  TempSrc: Oral         Complications: No notable events documented.

## 2023-04-13 NOTE — Anesthesia Postprocedure Evaluation (Signed)
Anesthesia Post Note  Patient: Ole Lafon  Procedure(s) Performed: TRANSURETHRAL WATERJET ABLATION OF PROSTATE (Prostate)     Patient location during evaluation: PACU Anesthesia Type: General Level of consciousness: awake and alert Pain management: pain level controlled Vital Signs Assessment: post-procedure vital signs reviewed and stable Respiratory status: spontaneous breathing, nonlabored ventilation, respiratory function stable and patient connected to nasal cannula oxygen Cardiovascular status: blood pressure returned to baseline and stable Postop Assessment: no apparent nausea or vomiting Anesthetic complications: no   No notable events documented.  Last Vitals:  Vitals:   04/13/23 1307 04/13/23 1315  BP:  131/87  Pulse: 75 71  Resp: 14 17  Temp:    SpO2: 93% 92%    Last Pain:  Vitals:   04/13/23 1315  TempSrc:   PainSc: 4                  [place]

## 2023-04-13 NOTE — Discharge Instructions (Addendum)
Transurethral Resection of the Prostate (TURP) or Greenlight laser ablation of the Prostate  Care After  Refer to this sheet in the next few weeks. These discharge instructions provide you with general information on caring for yourself after you leave the hospital. Your caregiver may also give you specific instructions. Your treatment has been planned according to the most current medical practices available, but unavoidable complications sometimes occur. If you have any problems or questions after discharge, please call your caregiver.  HOME CARE INSTRUCTIONS   Medications You may receive medicine for pain management. As your level of discomfort decreases, adjustments in your pain medicines may be made.  Take all medicines as directed.  You may be given a medicine (antibiotic) to kill germs following surgery. Finish all medicines. Let your caregiver know if you have any side effects or problems from the medicine.  If you are on aspirin, it would be best not to restart the aspirin until the blood in the urine clears Hygiene You can take a shower after surgery.  You should not take a bath while you still have the urethral catheter. Activity You will be encouraged to get out of bed as much as possible and increase your activity level as tolerated.  Spend the first week in and around your home. For 3 weeks, avoid the following:  Straining.  Running.  Strenuous work.  Walks longer than a few blocks.  Riding for extended periods.  Sexual relations.  Do not lift heavy objects (more than 20 pounds) for at least 1 month. When lifting, use your arms instead of your abdominal muscles.  You will be encouraged to walk as tolerated. Do not exert yourself. Increase your activity level slowly. Remember that it is important to keep moving after an operation of any type. This cuts down on the possibility of developing blood clots.  Your caregiver will tell you when you can resume driving and light  housework. Discuss this at your first office visit after discharge. Diet No special diet is ordered after a TURP. However, if you are on a special diet for another medical problem, it should be continued.  Normal fluid intake is usually recommended.  Avoid alcohol and caffeinated drinks for 2 weeks. They irritate the bladder. Decaffeinated drinks are okay.  Avoid spicy foods.  Bladder Function You may see some recurrence of blood in the urine after discharge from the hospital. This usually happens within 2 weeks after the procedure.If this occurs, force fluids again as you did in the hospital and reduce your activity.  Bowel Function You may experience some constipation after surgery. This can be minimized by increasing fluids and fiber in your diet. Drink enough water and fluids to keep your urine clear or pale yellow.  A stool softener may be prescribed for use at home. Do not strain to move your bowels.  If you are requiring increased pain medicine, it is important that you take stool softeners to prevent constipation. This will help to promote proper healing by reducing the need to strain to move your bowels.  Sexual Activity Semen movement in the opposite direction and into the bladder (retrograde ejaculation) may occur. Since the semen passes into the bladder, cloudy urine can occur the first time you urinate after intercourse. Or, you may not have an ejaculation during erection. Ask your caregiver when you can resume sexual activity. Retrograde ejaculation and reduced semen discharge should not reduce one's pleasure of intercourse.  Postoperative Visit Arrange the date and time of  your after surgery visit with your caregiver.  Return to Work After your recovery is complete, you will be able to return to work and resume all activities. Your caregiver will inform you when you can return to work.    Foley Catheter Care A soft, flexible tube (Foley catheter) may have been placed in your  bladder to drain urine and fluid. Follow these instructions: Taking Care of the Catheter Keep the area where the catheter leaves your body clean.  Attach the catheter to the leg so there is no tension on the catheter.  Keep the drainage bag below the level of the bladder, but keep it OFF the floor.  Do not take long soaking baths. Your caregiver will give instructions about showering.  Wash your hands before touching ANYTHING related to the catheter or bag.  Using mild soap and warm water on a washcloth:  Clean the area closest to the catheter insertion site using a circular motion around the catheter.  Clean the catheter itself by wiping AWAY from the insertion site for several inches down the tube.  NEVER wipe upward as this could sweep bacteria up into the urethra (tube in your body that normally drains the bladder) and cause infection.  Place a small amount of sterile lubricant at the tip of the penis where the catheter is entering the penis to help with dryness.  Taking Care of the Drainage Bags Two drainage bags may be taken home: a large overnight drainage bag, and a smaller leg bag which fits underneath clothing.  It is okay to wear the overnight bag at any time, but NEVER wear the smaller leg bag at night.  Keep the drainage bag well below the level of your bladder. This prevents backflow of urine into the bladder and allows the urine to drain freely.  Anchor the tubing to your leg to prevent pulling or tension on the catheter. Use tape or a leg strap provided by the hospital.  Empty the drainage bag when it is 1/2 to 3/4 full. Wash your hands before and after touching the bag.  Periodically check the tubing for kinks to make sure there is no pressure on the tubing which could restrict the flow of urine.  Changing the Drainage Bags Cleanse both ends of the clean bag with alcohol before changing.  Pinch off the rubber catheter to avoid urine spillage during the disconnection.   Disconnect the dirty bag and connect the clean one.  Empty the dirty bag carefully to avoid a urine spill.  Attach the new bag to the leg with tape or a leg strap.  Cleaning the Drainage Bags Whenever a drainage bag is disconnected, it must be cleaned quickly so it is ready for the next use.  Wash the bag in warm, soapy water.  Rinse the bag thoroughly with warm water.  Soak the bag for 30 minutes in a solution of white vinegar and water (1 cup vinegar to 1 quart warm water).  Rinse with warm water.  SEEK MEDICAL CARE IF:  You have chills or night sweats.  You are leaking around your catheter or have problems with your catheter. It is not uncommon to have sporadic leakage around your catheter as a result of bladder spasms. If the leakage stops, there is not much need for concern. If you are uncertain, call your caregiver.  You develop side effects that you think are coming from your medicines.  SEEK IMMEDIATE MEDICAL CARE IF:  You are suddenly unable to  urinate. Check to see if there are any kinks in the drainage tubing that may cause this. If you cannot find any kinks, call your caregiver immediately. This is an emergency.  You develop shortness of breath or chest pains.  Bleeding persists or clots develop in your urine.  You have a fever.  You develop pain in your back or over your lower belly (abdomen).  You develop pain or swelling in your legs.  Any problems you are having get worse rather than better.  MAKE SURE YOU:  Understand these instructions.  Will watch your condition.  Will get help right away if you are not doing well or get worse.

## 2023-04-13 NOTE — Anesthesia Preprocedure Evaluation (Signed)
Anesthesia Evaluation  Patient identified by MRN, date of birth, ID band Patient awake    Reviewed: Allergy & Precautions, H&P , NPO status , Patient's Chart, lab work & pertinent test results  Airway Mallampati: II  TM Distance: >3 FB Neck ROM: Full    Dental no notable dental hx.    Pulmonary Current Smoker   Pulmonary exam normal breath sounds clear to auscultation       Cardiovascular hypertension, + CAD  Normal cardiovascular exam Rhythm:Regular Rate:Normal  2. Coronary calcium score is 107, which places the patient in the 74th percentile for age and sex matched control.    Neuro/Psych  PSYCHIATRIC DISORDERS    Schizophrenia  Drug induced parkinsonism  negative neurological ROS  negative psych ROS   GI/Hepatic negative GI ROS, Neg liver ROS,,,  Endo/Other  negative endocrine ROSdiabetes, Type 2    Renal/GU negative Renal ROS   BPH     Musculoskeletal negative musculoskeletal ROS (+)    Abdominal   Peds negative pediatric ROS (+)  Hematology negative hematology ROS (+)   Anesthesia Other Findings   Reproductive/Obstetrics negative OB ROS                              Anesthesia Physical Anesthesia Plan  ASA: 3  Anesthesia Plan: General   Post-op Pain Management:    Induction: Intravenous  PONV Risk Score and Plan: Ondansetron  Airway Management Planned: Oral ETT  Additional Equipment:   Intra-op Plan:   Post-operative Plan: Extubation in OR  Informed Consent: I have reviewed the patients History and Physical, chart, labs and discussed the procedure including the risks, benefits and alternatives for the proposed anesthesia with the patient or authorized representative who has indicated his/her understanding and acceptance.     Dental advisory given  Plan Discussed with: CRNA  Anesthesia Plan Comments:          Anesthesia Quick Evaluation

## 2023-04-13 NOTE — Anesthesia Procedure Notes (Signed)
Procedure Name: Intubation Date/Time: 04/13/2023 11:30 AM  Performed by: Vanessa Lake Sumner, CRNAPre-anesthesia Checklist: Patient identified, Emergency Drugs available, Suction available and Patient being monitored Patient Re-evaluated:Patient Re-evaluated prior to induction Oxygen Delivery Method: Circle system utilized Preoxygenation: Pre-oxygenation with 100% oxygen Induction Type: IV induction Ventilation: Mask ventilation without difficulty Laryngoscope Size: 2 and Miller Grade View: Grade I Tube type: Oral Tube size: 7.5 mm Number of attempts: 1 Airway Equipment and Method: Stylet Placement Confirmation: ETT inserted through vocal cords under direct vision, positive ETCO2 and breath sounds checked- equal and bilateral Secured at: 22 cm Tube secured with: Tape Dental Injury: Teeth and Oropharynx as per pre-operative assessment

## 2023-04-13 NOTE — H&P (Signed)
H&P Chief Complaint: Urinary retention  History of Present Illness: 67 year old male with a 158 g prostate and urinary retention presents for aquablation.  Past Medical History:  Diagnosis Date   Anginal pain (HCC)    Blood creatinine increased compared with prior measurement 07/29/2019   Diabetes mellitus without complication (HCC)    Enlarged prostate    High risk sexual behavior 10/2007   Hypertension    Microcytosis    Schizophrenia (HCC)    Substance abuse (HCC)    tobacco use   UTI (urinary tract infection) 08/24/2016   Admitted 08/23/16-08/26/16 with several day history of UTI symptoms + positive UA. Culture resulted pan-sensitive E.coli. Was treated with IV Ceftriaxone and d/c home with Bactrim to complete 10 day course.    Past Surgical History:  Procedure Laterality Date   ARTHROSCOPY WITH ANTERIOR CRUCIATE LIGAMENT (ACL) REPAIR WITH ANTERIOR TIBILIAS GRAFT Right    CIRCUMCISION     LAMINECTOMY     by Dr Elesa Hacker    Home Medications:  Facility-Administered Medications Prior to Admission  Medication Dose Route Frequency Provider Last Rate Last Admin   fluPHENAZine decanoate (PROLIXIN) injection 25 mg  25 mg Intramuscular Q14 Days Arfeen, Phillips Grout, MD   25 mg at 04/10/23 1137   Medications Prior to Admission  Medication Sig Dispense Refill Last Dose   clotrimazole (LOTRIMIN) 1 % cream Apply 1 Application topically 2 (two) times daily. (Patient taking differently: Apply 1 Application topically 2 (two) times daily as needed (athletes foot).) 30 g 0    doxazosin (CARDURA) 2 MG tablet TAKE 1 TABLET BY MOUTH ONCE DAILY 30 tablet 1 04/13/2023 at 0730   empagliflozin (JARDIANCE) 10 MG TABS tablet Take 1 tablet (10 mg total) by mouth daily before breakfast. 30 tablet 11 04/09/2023   fluticasone (FLONASE) 50 MCG/ACT nasal spray Place 1 spray into both nostrils daily. (Patient taking differently: Place 1 spray into both nostrils daily as needed for allergies.) 16 mL 2    losartan  (COZAAR) 100 MG tablet TAKE 1 TABLET BY MOUTH ONCE DAILY 90 tablet 1 04/12/2023   rosuvastatin (CRESTOR) 20 MG tablet TAKE 1 TABLET BY MOUTH ONCE DAILY 90 tablet 1 04/12/2023   fluPHENAZine decanoate (PROLIXIN) 25 MG/ML injection Inject 1 mL (25 mg total) into the muscle every 14 (fourteen) days. 5 mL 2 04/10/2023   Allergies:  Allergies  Allergen Reactions   Ace Inhibitors Cough   Atorvastatin Other (See Comments)    Dizzy / Faint   Chlorpromazine Hcl Other (See Comments)    Passed out    Metformin And Related Diarrhea    Dizziness    Simvastatin Other (See Comments)    Dizzy / Faint    Family History  Problem Relation Age of Onset   Diabetes Mother    Hypertension Mother    Lupus Sister    Sickle cell anemia Brother    Drug abuse Brother    Social History:  reports that he has been smoking cigarettes. He has a 38 pack-year smoking history. He has never used smokeless tobacco. He reports current alcohol use. He reports that he does not use drugs.  ROS: A complete review of systems was performed.  All systems are negative except for pertinent findings as noted. ROS   Physical Exam:  Vital signs in last 24 hours: Temp:  [97.9 F (36.6 C)] 97.9 F (36.6 C) (10/11 0948) Pulse Rate:  [69] 69 (10/11 0948) Resp:  [16] 16 (10/11 0948) BP: (146)/(81) 146/81 (10/11 0948)  SpO2:  [100 %] 100 % (10/11 0948) Weight:  [97.5 kg] 97.5 kg (10/11 1020) General:  Alert and oriented, No acute distress HEENT: Normocephalic, atraumatic Neck: No JVD or lymphadenopathy Cardiovascular: Regular rate and rhythm Lungs: Regular rate and effort Abdomen: Soft, nontender, nondistended, no abdominal masses Back: No CVA tenderness Extremities: No edema Neurologic: Grossly intact  Laboratory Data:  Results for orders placed or performed during the hospital encounter of 04/13/23 (from the past 24 hour(s))  Glucose, capillary     Status: Abnormal   Collection Time: 04/13/23  9:53 AM  Result Value  Ref Range   Glucose-Capillary 159 (H) 70 - 99 mg/dL   Comment 1 Notify RN    Comment 2 Document in Chart    No results found for this or any previous visit (from the past 240 hour(s)). Creatinine: No results for input(s): "CREATININE" in the last 168 hours.  Impression/Assessment:  Urinary retention  Plan:  Proceed with aquablation.  Rhythm benefits discussed including but not limited to bleeding requiring blood transfusion, infection, injury to surrounding structures, urinary incontinence, among other imponderables.  Previously discussed and documented in past notes as well.  Ray Church, III 04/13/2023, 10:55 AM

## 2023-04-14 ENCOUNTER — Other Ambulatory Visit (HOSPITAL_COMMUNITY): Payer: Self-pay

## 2023-04-14 DIAGNOSIS — Z79899 Other long term (current) drug therapy: Secondary | ICD-10-CM | POA: Diagnosis not present

## 2023-04-14 DIAGNOSIS — R350 Frequency of micturition: Secondary | ICD-10-CM | POA: Diagnosis not present

## 2023-04-14 DIAGNOSIS — R339 Retention of urine, unspecified: Secondary | ICD-10-CM | POA: Diagnosis not present

## 2023-04-14 DIAGNOSIS — N401 Enlarged prostate with lower urinary tract symptoms: Secondary | ICD-10-CM | POA: Diagnosis not present

## 2023-04-14 DIAGNOSIS — I2510 Atherosclerotic heart disease of native coronary artery without angina pectoris: Secondary | ICD-10-CM | POA: Diagnosis not present

## 2023-04-14 DIAGNOSIS — I10 Essential (primary) hypertension: Secondary | ICD-10-CM | POA: Diagnosis not present

## 2023-04-14 DIAGNOSIS — F17210 Nicotine dependence, cigarettes, uncomplicated: Secondary | ICD-10-CM | POA: Diagnosis not present

## 2023-04-14 DIAGNOSIS — E119 Type 2 diabetes mellitus without complications: Secondary | ICD-10-CM | POA: Diagnosis not present

## 2023-04-14 DIAGNOSIS — R3912 Poor urinary stream: Secondary | ICD-10-CM | POA: Diagnosis not present

## 2023-04-14 LAB — GLUCOSE, CAPILLARY
Glucose-Capillary: 193 mg/dL — ABNORMAL HIGH (ref 70–99)
Glucose-Capillary: 139 mg/dL — ABNORMAL HIGH (ref 70–99)

## 2023-04-14 LAB — HEMOGLOBIN AND HEMATOCRIT, BLOOD
HCT: 43.7 % (ref 39.0–52.0)
Hemoglobin: 13.7 g/dL (ref 13.0–17.0)

## 2023-04-14 LAB — HIV ANTIBODY (ROUTINE TESTING W REFLEX): HIV Screen 4th Generation wRfx: NONREACTIVE

## 2023-04-14 MED ORDER — CHLORHEXIDINE GLUCONATE CLOTH 2 % EX PADS: 6.0000 | MEDICATED_PAD | Freq: Every day | CUTANEOUS | Status: DC

## 2023-04-14 NOTE — Plan of Care (Signed)

## 2023-04-14 NOTE — Progress Notes (Signed)
Call to [facility] by this RN to cancel discharge prescriptions. Pharmacy tech confirmed meds had been delivered to pt's home address and were signed for at the house. This RN updated pt on home delivery of medication yesterday

## 2023-04-14 NOTE — TOC CM/SW Note (Addendum)
Transition of Care [facility]) - Inpatient Brief Assessment   Patient Details  Name: Wayne Schmidt MRN: [id number] Date of Birth: 07-18-1955  [facility] CM/SW Contact:    Darleene Cleaver, LCSW Phone Number: 04/14/2023, 10:03 AM   Clinical Narrative:  Patient lives alone, SDOHs reviewed.  Patient plans to return back home, he did not express any other issues or concerns about returning back home.  TOC signing off.  Transition of Care Asessment: Insurance and Status: Insurance coverage has been reviewed Patient has primary care physician: Yes Home environment has been reviewed: Yes Prior level of function:: Indep Prior/Current Home Services: No current home services Social Determinants of Health Reivew: SDOH reviewed no interventions necessary Readmission risk has been reviewed: Yes Transition of care needs: no transition of care needs at this time

## 2023-04-14 NOTE — Progress Notes (Addendum)
Call to pt's daughter to see if she is available to pick up pt - daughter confirmed she can pick her dad up later on today, but pt's pharmacy will be closed. This RN spoke w/ Dr Joyce Gross regarding medication being switched to [employer]- this RN also in communication w/ [employer] regarding medications. Dr Joyce Gross also confirmed pt has an appointment in 3 days at [facility] for foley removal

## 2023-04-14 NOTE — Discharge Summary (Signed)
Date of admission: 04/13/2023  Date of discharge: 04/14/2023  Admission diagnosis: BPH, urinary retention  Discharge diagnosis: same  Secondary diagnoses:  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   Balanitis 11/24/2022   Aortic atherosclerosis (HCC) 01/11/2022   Elevated PSA 01/10/2022   Right shoulder pain 11/11/2019   Plantar fasciitis 06/04/2019   Healthcare maintenance 04/01/2014   Lumbar radiculopathy 08/04/2013   ONYCHOMYCOSIS, TOENAILS 07/07/2010   MICROCYTOSIS 02/24/2010   BPH associated with nocturia 03/24/2009   Coronary artery disease involving native coronary artery of native heart without angina pectoris 11/22/2006   Paranoid schizophrenia, subchronic condition (HCC) 10/11/2006   Hyperlipidemia 08/28/2006   Type 2 diabetes mellitus with other specified complication (HCC) 04/27/2006   TOBACCO ABUSE 04/27/2006   Essential hypertension 04/27/2006    Procedures performed: Procedure(s): TRANSURETHRAL WATERJET ABLATION OF PROSTATE  History and Physical: For full details, please see admission history and physical. Briefly, Wayne Schmidt is a 67 y.o. year old patient with a history of BPH, LUTS, and urinary retention (158g gland). He underwent cystoscopy and aquablation on 04/13/23.   Hospital Course: Patient tolerated the procedure well.  He was then transferred to the floor after an uneventful PACU stay.  His hospital course was uncomplicated.  He was placed on CBI postoperatively. On POD#1 his CBI was weaned off and the third port of his catheter was clamped. he had met discharge criteria: was eating a regular diet, was up and ambulating independently,  pain was well controlled, and was ready to for discharge. His foley was left in place on discharge with plans for removal in office.    Laboratory values:  Recent Labs    04/13/23 1929  04/14/23 0356  HGB 14.6 13.7  HCT 47.7 43.7   No results for input(s): "NA", "K", "CL", "CO2", "GLUCOSE", "BUN", "CREATININE", "CALCIUM" in the last 72 hours. No results for input(s): "LABPT", "INR" in the last 72 hours. No results for input(s): "LABURIN" in the last 72 hours. Results for orders placed or performed during the hospital encounter of 01/21/23  Urine Culture     Status: Abnormal   Collection Time: 01/22/23  1:27 AM   Specimen: Urine, Clean Catch  Result Value Ref Range Status   Specimen Description   Final    URINE, CLEAN CATCH Performed at [facility], [address] [place], [place] [address]    Special Requests   Final    NONE Performed at [facility], [address] [place], [place] [address]    Culture MULTIPLE SPECIES PRESENT, SUGGEST RECOLLECTION (A)  Final   Report Status 01/23/2023 FINAL  Final    Physical Exam  Gen: NAD Resp: Satting well on RA Card: Regular rate Abd: Soft, appropriately tender, ND, incision clean dry and intact GU:  Foley catheter in place draining urine. Neuro: Alert   Disposition: Home  Discharge instruction: The patient was instructed to be ambulatory but told to refrain from heavy lifting, strenuous activity, or driving.   Discharge medications:  Allergies as of 04/14/2023       Reactions   Ace Inhibitors Cough   Atorvastatin Other (See Comments)   Dizzy / Faint   Chlorpromazine Hcl Other (See Comments)   Passed out    Metformin And Related Diarrhea   Dizziness    Simvastatin Other (See Comments)   Dizzy / Faint  Medication List     TAKE these medications    amoxicillin-clavulanate 875-125 MG tablet Commonly known as: AUGMENTIN Take 1 tablet by mouth 2 (two) times daily.   clotrimazole 1 % cream Commonly known as: LOTRIMIN Apply 1 Application topically 2 (two) times daily. What changed:  when to take this reasons to take this   doxazosin 2 MG  tablet Commonly known as: CARDURA TAKE 1 TABLET BY MOUTH ONCE DAILY   empagliflozin 10 MG Tabs tablet Commonly known as: Jardiance Take 1 tablet (10 mg total) by mouth daily before breakfast.   fluPHENAZine decanoate 25 MG/ML injection Commonly known as: PROLIXIN Inject 1 mL (25 mg total) into the muscle every 14 (fourteen) days.   fluticasone 50 MCG/ACT nasal spray Commonly known as: FLONASE Place 1 spray into both nostrils daily. What changed:  when to take this reasons to take this   HYDROcodone-acetaminophen 5-325 MG tablet Commonly known as: Norco Take 1 tablet by mouth every 4 (four) hours as needed.   losartan 100 MG tablet Commonly known as: COZAAR TAKE 1 TABLET BY MOUTH ONCE DAILY   rosuvastatin 20 MG tablet Commonly known as: CRESTOR TAKE 1 TABLET BY MOUTH ONCE DAILY        Followup:

## 2023-04-14 NOTE — Care Management Obs Status (Signed)
MEDICARE OBSERVATION STATUS NOTIFICATION   Patient Details  Name: Wayne Schmidt MRN: [id number] Date of Birth: 09-17-1955   Medicare Observation Status Notification Given:  Yes    Darleene Cleaver, LCSW 04/14/2023, 9:55 AM

## 2023-04-14 NOTE — Progress Notes (Signed)
AVS reviewed w/ pt  who verbalized an understanding. Pt received oxycodone at 0600- will d/c home at 1400- per pt he drove  himself and does not have another ride home. PIV removed as noted. Primary RN updated on  discharge delay. Will change pt to leg bag at discharge.

## 2023-04-16 LAB — SURGICAL PATHOLOGY

## 2023-04-20 DIAGNOSIS — R338 Other retention of urine: Secondary | ICD-10-CM | POA: Diagnosis not present

## 2023-04-23 ENCOUNTER — Telehealth (HOSPITAL_BASED_OUTPATIENT_CLINIC_OR_DEPARTMENT_OTHER): Payer: 59 | Admitting: Psychiatry

## 2023-04-23 ENCOUNTER — Encounter (HOSPITAL_COMMUNITY): Payer: Self-pay | Admitting: Psychiatry

## 2023-04-23 VITALS — Wt 215.0 lb

## 2023-04-23 DIAGNOSIS — F200 Paranoid schizophrenia: Secondary | ICD-10-CM | POA: Diagnosis not present

## 2023-04-23 DIAGNOSIS — G2401 Drug induced subacute dyskinesia: Secondary | ICD-10-CM | POA: Diagnosis not present

## 2023-04-23 MED ORDER — FLUPHENAZINE DECANOATE 25 MG/ML IJ SOLN: 25.0000 mg | INTRAMUSCULAR | 2 refills | Status: DC

## 2023-04-23 NOTE — Progress Notes (Signed)
[facility] MD Virtual Progress Note   Patient Location: Home Provider Location: Home Office  I connect with patient by telephone and verified that I am speaking with correct person by using two identifiers. I discussed the limitations of evaluation and management by telemedicine and the availability of in person appointments. I also discussed with the patient that there may be a patient responsible charge related to this service. The patient expressed understanding and agreed to proceed.  Wayne Schmidt [id number] 67 y.o.  04/23/2023 10:27 AM  History of Present Illness:  Patient is evaluated by phone session.  He recently had prostate procedure.  He believed he had prostate problem because he is taking multiple medication.  He is upset but realizes that he need to follow the protocol and to take the medicine.  He sleeps okay.  He was given pain medicine and gradually he is getting better.  His thought process remains circumstantial and sometime he is uncooperative and guarded about his symptoms.  He is preoccupied with his religion and that helps him a lot.  He reported his wife's name rehab but did not provide much information.  He talks to his daughter on a regular basis.  He denies drinking or using any illegal substances.  He reported sleep is okay and denies any suicidal thoughts or homicidal thoughts.  When asked about his medication compliance he reported that he will follow the doctor's order and keep getting Prolixin injection.  He has mild tremor but does not want to talk about it because he afraid that we may prescribe some medication.  He does not want any more medication.  Past Psychiatric History: H/O schizophrenia with multiple inpatient treatment. H/O of hallucination, paranoia and suicidal attempt by overdose.  Last inpatient at [facility]. Took Haldol and Thorazine but had side effects.  On Prolixin injection for many years.  Tried hydroxyzine and Ingreeza but could not  tolerate.    Outpatient Encounter Medications as of 04/23/2023  Medication Sig   amoxicillin-clavulanate (AUGMENTIN) 875-125 MG tablet Take 1 tablet by mouth 2 (two) times daily.   clotrimazole (LOTRIMIN) 1 % cream Apply 1 Application topically 2 (two) times daily. (Patient taking differently: Apply 1 Application topically 2 (two) times daily as needed (athletes foot).)   doxazosin (CARDURA) 2 MG tablet TAKE 1 TABLET BY MOUTH ONCE DAILY   empagliflozin (JARDIANCE) 10 MG TABS tablet Take 1 tablet (10 mg total) by mouth daily before breakfast.   fluPHENAZine decanoate (PROLIXIN) 25 MG/ML injection Inject 1 mL (25 mg total) into the muscle every 14 (fourteen) days.   fluticasone (FLONASE) 50 MCG/ACT nasal spray Place 1 spray into both nostrils daily. (Patient taking differently: Place 1 spray into both nostrils daily as needed for allergies.)   HYDROcodone-acetaminophen (NORCO) 5-325 MG tablet Take 1 tablet by mouth every 4 (four) hours as needed.   losartan (COZAAR) 100 MG tablet TAKE 1 TABLET BY MOUTH ONCE DAILY   rosuvastatin (CRESTOR) 20 MG tablet TAKE 1 TABLET BY MOUTH ONCE DAILY   Facility-Administered Encounter Medications as of 04/23/2023  Medication   fluPHENAZine decanoate (PROLIXIN) injection 25 mg    Recent Results (from the past 2160 hour(s))  POC Hbg A1C     Status: Abnormal   Collection Time: 03/15/23 10:38 AM  Result Value Ref Range   Hemoglobin A1C 6.9 (A) 4.0 - 5.6 %   HbA1c POC (<> result, manual entry)     HbA1c, POC (prediabetic range)     HbA1c, POC (controlled diabetic  range)    Glucose, capillary     Status: Abnormal   Collection Time: 03/15/23 10:38 AM  Result Value Ref Range   Glucose-Capillary 116 (H) 70 - 99 mg/dL    Comment: Glucose reference range applies only to samples taken after fasting for at least 8 hours.  Lipid Profile     Status: None   Collection Time: 03/15/23 11:09 AM  Result Value Ref Range   Cholesterol, Total 154 100 - 199 mg/dL    Triglycerides 49 0 - 149 mg/dL   HDL 74 >30 mg/dL   VLDL Cholesterol Cal 11 5 - 40 mg/dL   LDL Chol Calc (NIH) 69 0 - 99 mg/dL   Chol/HDL Ratio 2.1 0.0 - 5.0 ratio    Comment:                                   T. Chol/HDL Ratio                                             Men  Women                               1/2 Avg.Risk  3.4    3.3                                   Avg.Risk  5.0    4.4                                2X Avg.Risk  9.6    7.1                                3X Avg.Risk 23.4   11.0   BMP8+Anion Gap     Status: Abnormal   Collection Time: 03/15/23 11:09 AM  Result Value Ref Range   Glucose 102 (H) 70 - 99 mg/dL   BUN 13 8 - 27 mg/dL   Creatinine, Ser 8.65 0.76 - 1.27 mg/dL   eGFR 69 >78 IO/NGE/9.52   BUN/Creatinine Ratio 11 10 - 24   Sodium 142 134 - 144 mmol/L   Potassium 4.6 3.5 - 5.2 mmol/L   Chloride 103 96 - 106 mmol/L   CO2 23 20 - 29 mmol/L   Anion Gap 16.0 10.0 - 18.0 mmol/L   Calcium 9.4 8.6 - 10.2 mg/dL  CBC no Diff     Status: Abnormal   Collection Time: 03/15/23 11:09 AM  Result Value Ref Range   WBC 6.6 3.4 - 10.8 x10E3/uL   RBC 6.38 (H) 4.14 - 5.80 x10E6/uL   Hemoglobin 15.8 13.0 - 17.7 g/dL   Hematocrit 84.1 (H) 32.4 - 51.0 %   MCV 82 79 - 97 fL   MCH 24.8 (L) 26.6 - 33.0 pg   MCHC 30.2 (L) 31.5 - 35.7 g/dL   RDW 40.1 (H) 02.7 - 25.3 %   Platelets 181 150 - 450 x10E3/uL  Iron, TIBC and Ferritin Panel     Status: None   Collection Time: 03/15/23 11:09 AM  Result Value Ref Range   Total Iron Binding Capacity 268 250 - 450 ug/dL   UIBC 829 562 - 130 ug/dL   Iron 66 38 - 865 ug/dL   Iron Saturation 25 15 - 55 %   Ferritin 195 30 - 400 ng/mL  Glucose, capillary     Status: Abnormal   Collection Time: 03/28/23  2:21 PM  Result Value Ref Range   Glucose-Capillary 143 (H) 70 - 99 mg/dL    Comment: Glucose reference range applies only to samples taken after fasting for at least 8 hours.  Glucose, capillary     Status: Abnormal   Collection  Time: 04/13/23  9:53 AM  Result Value Ref Range   Glucose-Capillary 159 (H) 70 - 99 mg/dL    Comment: Glucose reference range applies only to samples taken after fasting for at least 8 hours.   Comment 1 Notify RN    Comment 2 Document in Chart   Surgical pathology     Status: None   Collection Time: 04/13/23 12:44 PM  Result Value Ref Range   SURGICAL PATHOLOGY      SURGICAL PATHOLOGY CASE: [id number] PATIENT: [name] Surgical Pathology Report     Clinical History: Benign prostatic hyperplasia (crm)     FINAL MICROSCOPIC DIAGNOSIS:  A. PROSTATE CHIPS, TURP: Benign prostatic stromal hyperplasia   GROSS DESCRIPTION:  Received in formalin are variably sized, irregular rubbery tan-pink tissue fragments that are 3 grams. Block Summary: Entirely submitted in 3 blocks.  SW 04/13/2023   Final Diagnosis performed by Marlena Clipper, MD.   Electronically signed 04/16/2023 Technical and / or Professional components performed at [facility], [address] [place], [place] [address].  Immunohistochemistry Technical component (if applicable) was performed at [facility]. [address], [place], [place] [address].   IMMUNOHISTOCHEMISTRY DISCLAIMER (if applicable): Some of these immunohistochemical stains may have been developed and the performance characteristics determi ne by [facility]. Some may not have been cleared or approved by the U.S. Food and Drug Administration. The FDA has determined that such clearance or approval is not necessary. This test is used for clinical purposes. It should not be regarded as investigational or for research. This laboratory is certified under the Clinical Laboratory Improvement Amendments of 1988 (CLIA-88) as qualified to perform high complexity clinical laboratory testing.  The controls stained appropriately.   IHC stains are performed on formalin fixed, paraffin  embedded tissue using a 3,3"diaminobenzidine (DAB) chromogen and Leica Bond Autostainer System. The staining intensity of the nucleus is score manually and is reported as the percentage of tumor cell nuclei demonstrating specific nuclear staining. The specimens are fixed in 10% Neutral Formalin for at least 6 hours and up to 72hrs. These tests are validated on decalcified tissue. Results should be interpreted with caution given  the possibility of false negative results on decalcified specimens. Antibody Clones are as follows ER-clone 21F, PR-clone 16, Ki67- clone MM1. Some of these immunohistochemical stains may have been developed and the performance characteristics determined by [facility].   Glucose, capillary     Status: Abnormal   Collection Time: 04/13/23  1:01 PM  Result Value Ref Range   Glucose-Capillary 135 (H) 70 - 99 mg/dL    Comment: Glucose reference range applies only to samples taken after fasting for at least 8 hours.  Hemoglobin and hematocrit, blood     Status: None   Collection Time: 04/13/23  7:29 PM  Result Value Ref  Range   Hemoglobin 14.6 13.0 - 17.0 g/dL   HCT 09.8 11.9 - 14.7 %    Comment: Performed at [facility], [address] [place], [place] [address]  Glucose, capillary     Status: Abnormal   Collection Time: 04/13/23  9:46 PM  Result Value Ref Range   Glucose-Capillary 270 (H) 70 - 99 mg/dL    Comment: Glucose reference range applies only to samples taken after fasting for at least 8 hours.  Hemoglobin and hematocrit, blood     Status: None   Collection Time: 04/14/23  3:56 AM  Result Value Ref Range   Hemoglobin 13.7 13.0 - 17.0 g/dL   HCT 21.3 08.6 - 57.8 %    Comment: Performed at [facility], [address], [place], [place] [address]  HIV Antibody (routine testing w rflx)     Status: None   Collection Time: 04/14/23  3:56 AM  Result Value Ref Range   HIV Screen 4th Generation wRfx Non  Reactive Non Reactive    Comment: Performed at [facility], [address], [place], [place] [address]  Glucose, capillary     Status: Abnormal   Collection Time: 04/14/23  7:59 AM  Result Value Ref Range   Glucose-Capillary 193 (H) 70 - 99 mg/dL    Comment: Glucose reference range applies only to samples taken after fasting for at least 8 hours.  Glucose, capillary     Status: Abnormal   Collection Time: 04/14/23 12:01 PM  Result Value Ref Range   Glucose-Capillary 139 (H) 70 - 99 mg/dL    Comment: Glucose reference range applies only to samples taken after fasting for at least 8 hours.     Psychiatric Specialty Exam: Physical Exam  Review of Systems  Weight 215 lb (97.5 kg).There is no height or weight on file to calculate BMI.  General Appearance: NA  Eye Contact:  NA  Speech:  Slow  Volume:  Decreased  Mood:  Dysphoric  Affect:  NA  Thought Process:  Descriptions of Associations: Circumstantial  Orientation:  Full (Time, Place, and Person)  Thought Content:  Rumination and preoccupied with religion   Suicidal Thoughts:  No  Homicidal Thoughts:  No  Memory:  Immediate;   Fair Recent;   Fair Remote;   Fair  Judgement:  Fair  Insight:  Shallow  Psychomotor Activity:  NA  Concentration:  Concentration: Fair and Attention Span: Fair  Recall:  [employer] of Knowledge:  Fair  Language:  Fair  Akathisia:  No  Handed:  Right  AIMS (if indicated):     Assets:  Communication Skills Desire for Improvement Housing Transportation  ADL's:  Intact  Cognition:  WNL  Sleep:  ok     Assessment/Plan: Paranoid schizophrenia, subchronic condition (HCC) - Plan: fluPHENAZine decanoate (PROLIXIN) 25 MG/ML injection  Tardive dyskinesia - Plan: fluPHENAZine decanoate (PROLIXIN) 25 MG/ML injection  Patient does not want to talk about his symptoms but also reported that he is getting injection and sleeping good.  He appears stable but he still has chronic symptoms however  manageable.  Continue Prolixin 25 mg intramuscular every 2 weeks.  I reviewed blood work results.  Recommend to call us back if he has any question or any concern.  Follow-up in 3 months.   Follow Up Instructions:     I discussed the assessment and treatment plan with the patient. The patient was provided an opportunity to ask questions and all  were answered. The patient agreed with the plan and demonstrated an understanding of the instructions.   The patient was advised to call back or seek an in-person evaluation if the symptoms worsen or if the condition fails to improve as anticipated.    Collaboration of Care: Other provider involved in patient's care AEB notes are available in epic to review  Patient/Guardian was advised Release of Information must be obtained prior to any record release in order to collaborate their care with an outside provider. Patient/Guardian was advised if they have not already done so to contact the registration department to sign all necessary forms in order for [place] to release information regarding their care.   Consent: Patient/Guardian gives verbal consent for treatment and assignment of benefits for services provided during this visit. Patient/Guardian expressed understanding and agreed to proceed.     I provided 20 minutes of non face to face time during this encounter.  Note: This document was prepared by [employer] voice dictation technology and any errors that results from this process are unintentional.    Cleotis Nipper, MD 04/23/2023

## 2023-04-24 ENCOUNTER — Encounter (HOSPITAL_COMMUNITY): Payer: Self-pay

## 2023-04-24 ENCOUNTER — Ambulatory Visit (HOSPITAL_COMMUNITY): Payer: 59 | Admitting: *Deleted

## 2023-04-24 ENCOUNTER — Telehealth (HOSPITAL_COMMUNITY): Payer: Self-pay | Admitting: *Deleted

## 2023-04-24 VITALS — BP 134/80 | HR 89 | Resp 20 | Ht 73.0 in | Wt 211.6 lb

## 2023-04-24 DIAGNOSIS — F200 Paranoid schizophrenia: Secondary | ICD-10-CM

## 2023-04-24 NOTE — Telephone Encounter (Signed)
Pt scheduled for due Prolixin Decanoate injection today however pharmacy did not deliver medication to his home or this office. Writer spoke with [facility] and order will be delivered to this office this afternoon. Pt to return tomorrow 04/25/23 for due injection.

## 2023-04-25 ENCOUNTER — Ambulatory Visit (HOSPITAL_COMMUNITY): Payer: 59 | Admitting: *Deleted

## 2023-04-25 VITALS — BP 134/80

## 2023-04-25 DIAGNOSIS — F200 Paranoid schizophrenia: Secondary | ICD-10-CM

## 2023-04-25 NOTE — Patient Instructions (Addendum)
Pt presents today for due Prolixin (Fluphenazine) Decanoate 25 mg injection. Pt is pleasant and cooperative on approach. Pt is baseline hyper religious and tangential. Hygiene appropriate. Pt guarded, avoidant,  when asked about AVH, HI, SI.  Injection prepared as ordered and given in RD without complaint. Wayne Schmidt will return in 2 weeks for next due injection.

## 2023-05-01 ENCOUNTER — Other Ambulatory Visit: Payer: Self-pay

## 2023-05-01 DIAGNOSIS — I10 Essential (primary) hypertension: Secondary | ICD-10-CM

## 2023-05-02 MED ORDER — DOXAZOSIN MESYLATE 2 MG PO TABS: ORAL_TABLET | ORAL | 1 refills | Status: DC

## 2023-05-06 ENCOUNTER — Emergency Department (HOSPITAL_COMMUNITY)
Admission: EM | Admit: 2023-05-06 | Discharge: 2023-05-06 | Disposition: A | Discharge status: Home / Self Care | Payer: 59 | Attending: Emergency Medicine | Admitting: Emergency Medicine

## 2023-05-06 ENCOUNTER — Other Ambulatory Visit: Payer: Self-pay

## 2023-05-06 DIAGNOSIS — Y732 Prosthetic and other implants, materials and accessory gastroenterology and urology devices associated with adverse incidents: Secondary | ICD-10-CM | POA: Diagnosis not present

## 2023-05-06 DIAGNOSIS — T83098A Other mechanical complication of other indwelling urethral catheter, initial encounter: Secondary | ICD-10-CM | POA: Diagnosis not present

## 2023-05-06 DIAGNOSIS — T83091A Other mechanical complication of indwelling urethral catheter, initial encounter: Secondary | ICD-10-CM | POA: Diagnosis not present

## 2023-05-06 DIAGNOSIS — T839XXA Unspecified complication of genitourinary prosthetic device, implant and graft, initial encounter: Secondary | ICD-10-CM

## 2023-05-06 NOTE — ED Provider Notes (Signed)
[place] EMERGENCY DEPARTMENT AT [facility] Provider Note   CSN: [id number] Arrival date & time: 05/06/23  1630     History {Add pertinent medical, surgical, social history, OB history to HPI:1} Chief Complaint  Patient presents with   foley problem    Wayne Schmidt is a 67 y.o. male.  Patient has a Foley catheter.  He states that it has started leaking around the catheter   Weakness      Home Medications Prior to Admission medications   Medication Sig Start Date End Date Taking? Authorizing Provider  empagliflozin (JARDIANCE) 10 MG TABS tablet Take 1 tablet (10 mg total) by mouth daily before breakfast. 08/25/22  Yes Gust Rung, DO  fluPHENAZine decanoate (PROLIXIN) 25 MG/ML injection Inject 1 mL (25 mg total) into the muscle every 14 (fourteen) days. 04/23/23  Yes Arfeen, Phillips Grout, MD  losartan (COZAAR) 100 MG tablet TAKE 1 TABLET BY MOUTH ONCE DAILY 02/28/23  Yes Carlynn Purl C, DO  rosuvastatin (CRESTOR) 20 MG tablet TAKE 1 TABLET BY MOUTH ONCE DAILY 02/28/23  Yes Gust Rung, DO  amoxicillin-clavulanate (AUGMENTIN) 875-125 MG tablet Take 1 tablet by mouth 2 (two) times daily. Patient not taking: Reported on 05/06/2023 04/13/23   Ray Church III, MD  clotrimazole (LOTRIMIN) 1 % cream Apply 1 Application topically 2 (two) times daily. Patient not taking: Reported on 05/06/2023 11/23/22   Gust Rung, DO  doxazosin (CARDURA) 2 MG tablet TAKE 1 TABLET BY MOUTH ONCE DAILY Patient not taking: Reported on 05/06/2023 05/02/23   Mercie Eon, MD  fluticasone [facility]) 50 MCG/ACT nasal spray Place 1 spray into both nostrils daily. Patient not taking: Reported on 05/06/2023 08/24/22   Gust Rung, DO  HYDROcodone-acetaminophen (NORCO) 5-325 MG tablet Take 1 tablet by mouth every 4 (four) hours as needed. Patient not taking: Reported on 05/06/2023 04/13/23   Crista Elliot, MD      Allergies    Ace inhibitors, Atorvastatin, Chlorpromazine hcl,  Metformin and related, and Simvastatin    Review of Systems   Review of Systems  Neurological:  Positive for weakness.    Physical Exam Updated Vital Signs BP (!) 151/83   Pulse 62   Temp 97.7 F (36.5 C)   Resp 18   Ht 6\' 1"  (1.854 m)   Wt 96 kg   SpO2 99%   BMI 27.92 kg/m  Physical Exam  ED Results / Procedures / Treatments   Labs (all labs ordered are listed, but only abnormal results are displayed) Labs Reviewed - No data to display  EKG None  Radiology No results found.  Procedures Procedures  {Document cardiac monitor, telemetry assessment procedure when appropriate:1}  Medications Ordered in ED Medications - No data to display  ED Course/ Medical Decision Making/ A&P   {   Click here for ABCD2, HEART and other calculatorsREFRESH Note before signing :1}                              Medical Decision Making  Patient with Foley catheter problem.  It was replaced without problems and he will follow-up with urology  {Document critical care time when appropriate:1} {Document review of labs and clinical decision tools ie heart score, Chads2Vasc2 etc:1}  {Document your independent review of radiology images, and any outside records:1} {Document your discussion with family members, caretakers, and with consultants:1} {Document social determinants of health affecting pt's care:1} {Document  your decision making why or why not admission, treatments were needed:1} Final Clinical Impression(s) / ED Diagnoses Final diagnoses:  Problem with Foley catheter, initial encounter [facility])    Rx / DC Orders ED Discharge Orders     None

## 2023-05-06 NOTE — Discharge Instructions (Signed)
Follow up with your urologist as planned

## 2023-05-06 NOTE — ED Provider Triage Note (Signed)
Emergency Medicine Provider Triage Evaluation Note  Wayne Schmidt , a 67 y.o. male  was evaluated in triage.  Pt complains of leaking around foley. Placed after BPH surgery 04/13/23. Mild blood in bag. No fever, abd pain  Review of Systems  Positive: Leaking around foley site Negative:   Physical Exam  BP 125/85 (BP Location: Left Arm)   Pulse 76   Temp 97.9 F (36.6 C) (Oral)   Resp 18   Ht 6\' 1"  (1.854 m)   Wt 96 kg   SpO2 97%   BMI 27.92 kg/m  Gen:   Awake, no distress   Resp:  Normal effort  MSK:   Moves extremities without difficulty  Other:    Medical Decision Making  Medically screening exam initiated at 5:43 PM.  Appropriate orders placed.  Bunyan Brier was informed that the remainder of the evaluation will be completed by another provider, this initial triage assessment does not replace that evaluation, and the importance of remaining in the ED until their evaluation is complete.  Leaking around foley site   Solash Tullo A, PA-C 05/06/23 1743

## 2023-05-06 NOTE — ED Triage Notes (Signed)
Pt arrived reporting catheter is leaking around insertion site. Endorse hematuria. Denies any abdominal pain or any other symptoms.

## 2023-05-08 ENCOUNTER — Ambulatory Visit (HOSPITAL_COMMUNITY): Payer: 59

## 2023-05-15 ENCOUNTER — Ambulatory Visit (HOSPITAL_BASED_OUTPATIENT_CLINIC_OR_DEPARTMENT_OTHER): Payer: 59

## 2023-05-15 ENCOUNTER — Other Ambulatory Visit: Payer: Self-pay

## 2023-05-15 ENCOUNTER — Encounter (HOSPITAL_COMMUNITY): Payer: Self-pay

## 2023-05-15 VITALS — BP 147/90 | HR 72 | Ht 72.1 in | Wt 211.0 lb

## 2023-05-15 DIAGNOSIS — F200 Paranoid schizophrenia: Secondary | ICD-10-CM | POA: Diagnosis not present

## 2023-05-15 NOTE — Progress Notes (Signed)
Patient arrives today for his due injection of Fluphenazine Decanoate injection. Patient comes in a good mood with an appropriate affect. He talks a lot about his grand daughter who is getting ready to turn 68 years old. Patient is very happy with all of his grandchildren. Patient denies any SI/HI and AVH. Injection was prepared as ordered and administered in patients left deltoid. Patient tolerated well and without complaint. Patient will return in two weeks for his next due injection.    NDC: 40981-191-47 LOT: [id number] EXP: [id number]

## 2023-05-18 ENCOUNTER — Telehealth: Payer: Self-pay

## 2023-05-18 NOTE — Telephone Encounter (Signed)
Transition Care Management Unsuccessful Follow-up Telephone Call  Date of discharge and from where:  05/06/2023 [facility]  Attempts:  1st Attempt  Reason for unsuccessful TCM follow-up call:  No answer/busy  Ivie Savitt [facility]  [facility] Day Resource Care Guide Direct Dial: [phone]  Website: [name]

## 2023-05-21 ENCOUNTER — Telehealth: Payer: Self-pay

## 2023-05-21 NOTE — Telephone Encounter (Signed)
Transition Care Management Follow-up Telephone Call Date of discharge and from where: 05/06/2023 [facility] How have you been since you were released from the hospital? Patient declined to participate, He did not want to verify date of birth or address.  Wayne Schmidt [facility]  [facility] Guide Direct Dial: [phone]  Website: [name]

## 2023-05-23 DIAGNOSIS — R338 Other retention of urine: Secondary | ICD-10-CM | POA: Diagnosis not present

## 2023-05-23 DIAGNOSIS — H5213 Myopia, bilateral: Secondary | ICD-10-CM | POA: Diagnosis not present

## 2023-05-23 DIAGNOSIS — E119 Type 2 diabetes mellitus without complications: Secondary | ICD-10-CM | POA: Diagnosis not present

## 2023-05-23 DIAGNOSIS — H2513 Age-related nuclear cataract, bilateral: Secondary | ICD-10-CM | POA: Diagnosis not present

## 2023-05-23 LAB — HM DIABETES EYE EXAM

## 2023-05-29 ENCOUNTER — Ambulatory Visit (HOSPITAL_BASED_OUTPATIENT_CLINIC_OR_DEPARTMENT_OTHER): Payer: 59

## 2023-05-29 ENCOUNTER — Other Ambulatory Visit: Payer: Self-pay

## 2023-05-29 ENCOUNTER — Encounter (HOSPITAL_COMMUNITY): Payer: Self-pay

## 2023-05-29 VITALS — BP 154/84 | HR 75 | Ht 72.5 in | Wt 217.0 lb

## 2023-05-29 DIAGNOSIS — F200 Paranoid schizophrenia: Secondary | ICD-10-CM

## 2023-05-29 NOTE — Progress Notes (Signed)
Patient arrived today for his due injection of Fluphenazine Dec 125 mg/58mL. Patient presents with a bright affect, he is wearing his partials today and is smiling wide to show them off. Patient is in a good mood and is looking forward to the holiday. He is going to his son's house for Thanksgiving. Patient denies any SI/HI or AVH. Injection was prepared and administered in patients right deltoid. Patient tolerated well and without complaint.     WUJ:[id number] LOT: [id number] EXP: 06/01/2024

## 2023-06-12 ENCOUNTER — Ambulatory Visit (HOSPITAL_BASED_OUTPATIENT_CLINIC_OR_DEPARTMENT_OTHER): Payer: 59 | Admitting: *Deleted

## 2023-06-12 VITALS — BP 158/88 | HR 74 | Resp 20 | Ht 72.0 in | Wt 217.6 lb

## 2023-06-12 DIAGNOSIS — F200 Paranoid schizophrenia: Secondary | ICD-10-CM

## 2023-06-12 NOTE — Patient Instructions (Signed)
Pt presents today for due generic Prolixin (Fluphenazine Decanoate) 25 mg injection. Pt receives injection bi-weekly. Pt is pleasant and cooperative on approach. Affect bright, mood calm, cooperative, approachable. Pt has baseline hyper religiosity but is not noted to be responding to internal stimuli and is guarded in relation to disclosing any AVH. Pt wearing his dentures today and hygiene appropriate. Injection prepared as ordered and given in LD without c/o. Pt to return in 2 weeks for next due injection.

## 2023-06-21 ENCOUNTER — Encounter: Payer: Self-pay | Admitting: Internal Medicine

## 2023-06-21 ENCOUNTER — Ambulatory Visit: Payer: 59 | Admitting: Internal Medicine

## 2023-06-21 VITALS — BP 152/82 | HR 69 | Temp 98.1°F | Ht 73.0 in | Wt 219.1 lb

## 2023-06-21 DIAGNOSIS — E1169 Type 2 diabetes mellitus with other specified complication: Secondary | ICD-10-CM

## 2023-06-21 DIAGNOSIS — I10 Essential (primary) hypertension: Secondary | ICD-10-CM

## 2023-06-21 LAB — POCT GLYCOSYLATED HEMOGLOBIN (HGB A1C): Hemoglobin A1C: 7.1 % — AB (ref 4.0–5.6)

## 2023-06-21 LAB — GLUCOSE, CAPILLARY: Glucose-Capillary: 147 mg/dL — ABNORMAL HIGH (ref 70–99)

## 2023-06-21 MED ORDER — AMLODIPINE-OLMESARTAN 5-40 MG PO TABS: 1.0000 | ORAL_TABLET | Freq: Every day | ORAL | 1 refills | Status: DC

## 2023-06-21 NOTE — Progress Notes (Unsigned)
   Established Patient Office Visit  Subjective   Patient ID: Wayne Schmidt, male    DOB: 12-09-1955  Age: 66 y.o. MRN: [id number]  Chief Complaint  Patient presents with   Follow-up    S/P prostate surgery. Diabetes.   Wayne Schmidt reports that he is doing much better now that he has the Foley catheter removed he has successfully undergone prostate surgery and urinating on his own.  He is pleased with this.  He has stopped taking his doxazosin which was dual prescribed for hypertension and BPH.  His blood pressure is elevated today however he denies any headache blurry vision or other symptoms from his elevated blood pressure.  He is taking losartan 100 mg daily.  He remains resistant to medications and especially higher milligram medications.     Objective:     BP (!) 167/82 (BP Location: Right Arm, Patient Position: Sitting)   Pulse 72   Temp 98.1 F (36.7 C) (Oral)   Ht 6\' 1"  (1.854 m)   Wt 219 lb 1.6 oz (99.4 kg)   SpO2 100% Comment: RA  BMI 28.91 kg/m  BP Readings from Last 3 Encounters:  06/21/23 (!) 152/82  06/12/23 (!) 158/88  05/29/23 (!) 154/84   Wt Readings from Last 3 Encounters:  06/21/23 219 lb 1.6 oz (99.4 kg)  06/12/23 217 lb 9.6 oz (98.7 kg)  05/29/23 217 lb (98.4 kg)      Physical Exam Vitals and nursing note reviewed.  Constitutional:      Appearance: Normal appearance.  Cardiovascular:     Rate and Rhythm: Normal rate. Rhythm irregular.  Pulmonary:     Effort: Pulmonary effort is normal.     Breath sounds: Normal breath sounds.  Neurological:     Mental Status: He is alert.      Results for orders placed or performed in visit on 06/21/23  Glucose, capillary  Result Value Ref Range   Glucose-Capillary 147 (H) 70 - 99 mg/dL    Last CBC Lab Results  Component Value Date   WBC 6.6 03/15/2023   HGB 13.7 04/14/2023   HCT 43.7 04/14/2023   MCV 82 03/15/2023   MCH 24.8 (L) 03/15/2023   RDW 17.1 (H) 03/15/2023   PLT 181 03/15/2023   Last  metabolic panel Lab Results  Component Value Date   GLUCOSE 102 (H) 03/15/2023   NA 142 03/15/2023   K 4.6 03/15/2023   CL 103 03/15/2023   CO2 23 03/15/2023   BUN 13 03/15/2023   CREATININE 1.16 03/15/2023   EGFR 69 03/15/2023   CALCIUM 9.4 03/15/2023   PROT 7.1 12/01/2022   ALBUMIN 4.1 12/01/2022   LABGLOB 2.4 09/20/2021   AGRATIO 1.9 09/20/2021   BILITOT 0.6 12/01/2022   ALKPHOS 67 12/01/2022   AST 28 12/01/2022   ALT 21 12/01/2022   ANIONGAP 10 12/01/2022   Last hemoglobin A1c Lab Results  Component Value Date   HGBA1C 7.1 (A) 06/21/2023   Last vitamin D No results found for: "25OHVITD2", "25OHVITD3", "VD25OH"    The 10-year ASCVD risk score (Arnett DK, et al., 2019) is: 58.6%    Assessment & Plan:   Problem List Items Addressed This Visit       Endocrine   Type 2 diabetes mellitus with other specified complication (HCC) - Primary   Relevant Orders   POC Hbg A1C   Microalbumin / creatinine urine ratio    No follow-ups on file.    Gust Rung, DO

## 2023-06-21 NOTE — Patient Instructions (Signed)
I am changing your Blood pressure medications.  Stop taking Doxazosin.  I am replacing your Losartan 100mg  with a medication called Amlodipine-Olmesartan 5-40mg  daily.

## 2023-06-22 LAB — MICROALBUMIN / CREATININE URINE RATIO
Creatinine, Urine: 65 mg/dL
Microalb/Creat Ratio: 515 mg/g{creat} — ABNORMAL HIGH (ref 0–29)
Microalbumin, Urine: 334.8 ug/mL

## 2023-06-23 NOTE — Assessment & Plan Note (Signed)
A1c still well controlled before 8. Up to 7.1 due to some diet changes. No medication changes today.

## 2023-06-23 NOTE — Assessment & Plan Note (Signed)
Uncontrolled.  He is resistant to medications.  We was agreeable to change from losartan 100mg  to olmesartan 40mg  with addition of 5 mg amlodipine.  I think we can likely get him to goal with this change.  He has stopped doxazosin which is fine.

## 2023-06-26 ENCOUNTER — Ambulatory Visit (HOSPITAL_BASED_OUTPATIENT_CLINIC_OR_DEPARTMENT_OTHER): Payer: 59 | Admitting: *Deleted

## 2023-06-26 VITALS — BP 163/88 | HR 77 | Resp 20 | Ht 73.0 in | Wt 216.8 lb

## 2023-06-26 DIAGNOSIS — F200 Paranoid schizophrenia: Secondary | ICD-10-CM | POA: Diagnosis not present

## 2023-06-26 NOTE — Patient Instructions (Addendum)
Pt presents today for due Prolixin 25 mg bi-weekly injection. Pt is cooperative and bright on approach. Pt denies any complaints. Baseline tangential speech, thoughts, hyper religious. Guarded about endorsing AVH. Injection prepared as ordered and administered in RD without complaint. Pt to return in 2 weeks for next due injection.

## 2023-07-10 ENCOUNTER — Ambulatory Visit (HOSPITAL_BASED_OUTPATIENT_CLINIC_OR_DEPARTMENT_OTHER): Payer: 59

## 2023-07-10 ENCOUNTER — Encounter (HOSPITAL_COMMUNITY): Payer: Self-pay

## 2023-07-10 ENCOUNTER — Other Ambulatory Visit: Payer: Self-pay

## 2023-07-10 VITALS — BP 176/91 | HR 75 | Ht 72.5 in | Wt 214.0 lb

## 2023-07-10 DIAGNOSIS — F200 Paranoid schizophrenia: Secondary | ICD-10-CM

## 2023-07-10 NOTE — Progress Notes (Signed)
 Patient arrives today for due bi-weekly injection of Prolixin  25 mg. Patient is well dressed and cooperative. Patient denies any SI/HI or AVH. Injection was prepared as ordered and administered in patients Left Deltoid. Patient tolerated well and without complaint. Patient will return in 2 weeks for his next due injection.    NDC: 9856-0470-98 LOT: [id number] EXP: 2026/06

## 2023-07-12 ENCOUNTER — Ambulatory Visit (HOSPITAL_COMMUNITY): Payer: 59

## 2023-07-18 ENCOUNTER — Encounter: Payer: Self-pay | Admitting: Dietician

## 2023-07-18 ENCOUNTER — Ambulatory Visit: Payer: 59 | Admitting: *Deleted

## 2023-07-18 NOTE — Progress Notes (Signed)
    Tex Filbert presented today for blood pressure check. Patient is prescribed blood pressure medications and patient stated he did not take their blood pressure medication prior to today's appointment. Blood pressure was taken in the usual and appropriate manner using an automated BP cuff.     Vitals:   07/18/23 1319 07/18/23 1326  BP: (!) 160/86 (!) 159/84      Results of today's visit will be routed to Dr. Adriane Albe for review and further management.  Pt stated the new medication, Amlodipine -olmesartan , was not in his pill pak. I called [facility] who stated it is in his pill pak; stated pt may only see Amlodipine  instead of the full name since it may not have enough space for the complete name. This was told to the pt and I asked pt to check when he gets home. And for any questions to call the pharmacy.

## 2023-07-24 ENCOUNTER — Ambulatory Visit (HOSPITAL_BASED_OUTPATIENT_CLINIC_OR_DEPARTMENT_OTHER): Payer: 59

## 2023-07-24 ENCOUNTER — Other Ambulatory Visit: Payer: Self-pay

## 2023-07-24 ENCOUNTER — Telehealth (HOSPITAL_BASED_OUTPATIENT_CLINIC_OR_DEPARTMENT_OTHER): Payer: 59 | Admitting: Psychiatry

## 2023-07-24 ENCOUNTER — Encounter (HOSPITAL_COMMUNITY): Payer: Self-pay | Admitting: Psychiatry

## 2023-07-24 ENCOUNTER — Encounter (HOSPITAL_COMMUNITY): Payer: Self-pay

## 2023-07-24 VITALS — BP 156/96 | HR 62 | Ht 72.5 in | Wt 215.0 lb

## 2023-07-24 VITALS — Wt 214.0 lb

## 2023-07-24 DIAGNOSIS — F200 Paranoid schizophrenia: Secondary | ICD-10-CM

## 2023-07-24 DIAGNOSIS — G2401 Drug induced subacute dyskinesia: Secondary | ICD-10-CM

## 2023-07-24 DIAGNOSIS — E785 Hyperlipidemia, unspecified: Secondary | ICD-10-CM

## 2023-07-24 MED ORDER — EMPAGLIFLOZIN 10 MG PO TABS: 10.0000 mg | ORAL_TABLET | Freq: Every day | ORAL | 11 refills | Status: DC

## 2023-07-24 MED ORDER — FLUPHENAZINE DECANOATE 25 MG/ML IJ SOLN: 25.0000 mg | INTRAMUSCULAR | 2 refills | Status: DC

## 2023-07-24 MED ORDER — ROSUVASTATIN CALCIUM 20 MG PO TABS: ORAL_TABLET | ORAL | 3 refills | Status: DC

## 2023-07-24 NOTE — Progress Notes (Signed)
[facility] MD Virtual Progress Note   Patient Location: Home  Provider Location: Home Office  I connect with patient by telephone and verified that I am speaking with correct person by using two identifiers. I discussed the limitations of evaluation and management by telemedicine and the availability of in person appointments. I also discussed with the patient that there may be a patient responsible charge related to this service. The patient expressed understanding and agreed to proceed.  Wayne Schmidt [id number] 68 y.o.  07/24/2023 10:15 AM  History of Present Illness:  Patient is evaluated by phone session.  He did not have means to do the video session and he gets easily upset because he does not have finances to buy a computer.  He remained religiously preoccupied and delusional that there is conspiracy about him to give the medication.  He reported he is healed from the God and does not need injections.  He believes his prostate messed up because of the injections.  After a few minutes he was able to calm down and I offered to see him in office in person when she agreed for the next visit.  He is getting injection but he feels the dose is very strong.  He used to take higher doses and we have cut down the dose.  He does not want to change the medication when he was explained the doses same for a while.  He gets easily upset, irritable.  Patient told his wife he is in rehab for the past 2 years but did not provide much information.  He also did not provide about his family members but reported that he has multiple grandkids who he is in touch on a regular basis.  He sleeps okay.  Patient is guarded about his symptoms.  As per EMR he is getting Prolixin injection every 2 weeks.  He has tremors but does not want to take any medication.  Denies any hallucination or any suicidal thoughts.  Past Psychiatric History: H/O schizophrenia with multiple inpatient treatment. H/O of  hallucination, paranoia and suicidal attempt by overdose.  Last inpatient at [facility]. Took Haldol and Thorazine but had side effects.  On Prolixin injection for many years.  Tried hydroxyzine and Ingreeza but could not tolerate.    Outpatient Encounter Medications as of 07/24/2023  Medication Sig   amLODipine-olmesartan (AZOR) 5-40 MG tablet Take 1 tablet by mouth daily.   empagliflozin (JARDIANCE) 10 MG TABS tablet Take 1 tablet (10 mg total) by mouth daily before breakfast.   fluPHENAZine decanoate (PROLIXIN) 25 MG/ML injection Inject 1 mL (25 mg total) into the muscle every 14 (fourteen) days.   fluticasone (FLONASE) 50 MCG/ACT nasal spray Place 1 spray into both nostrils daily. (Patient not taking: Reported on 05/06/2023)   rosuvastatin (CRESTOR) 20 MG tablet TAKE 1 TABLET BY MOUTH ONCE DAILY   Facility-Administered Encounter Medications as of 07/24/2023  Medication   fluPHENAZine decanoate (PROLIXIN) injection 25 mg    Recent Results (from the past 2160 hours)  [facility] DIABETES EYE EXAM     Status: None   Collection Time: 05/23/23 12:00 AM  Result Value Ref Range   [facility] Diabetic Eye Exam No Retinopathy No Retinopathy    Comment: Done by Vincenza Hews, MD  Microalbumin / creatinine urine ratio     Status: Abnormal   Collection Time: 06/21/23 10:40 AM  Result Value Ref Range   Creatinine, Urine 65.0 Not Estab. mg/dL   Microalbumin, Urine 283.1 Not Estab. ug/mL  Microalb/Creat Ratio 515 (H) 0 - 29 mg/g creat    Comment:                        Normal:                0 -  29                        Moderately increased: 30 - 300                        Severely increased:       >300   POC Hbg A1C     Status: Abnormal   Collection Time: 06/21/23 10:42 AM  Result Value Ref Range   Hemoglobin A1C 7.1 (A) 4.0 - 5.6 %   HbA1c POC (<> result, manual entry)     HbA1c, POC (prediabetic range)     HbA1c, POC (controlled diabetic range)    Glucose, capillary     Status: Abnormal   Collection Time:  06/21/23 10:42 AM  Result Value Ref Range   Glucose-Capillary 147 (H) 70 - 99 mg/dL    Comment: Glucose reference range applies only to samples taken after fasting for at least 8 hours.     Psychiatric Specialty Exam: Physical Exam  Review of Systems  Weight 214 lb (97.1 kg).There is no height or weight on file to calculate BMI.  General Appearance: NA  Eye Contact:  NA  Speech:  Slow  Volume:  Increased  Mood:  Irritable  Affect:  NA  Thought Process:  Descriptions of Associations: Circumstantial  Orientation:  Full (Time, Place, and Person)  Thought Content:  Delusions and Paranoid Ideation  Suicidal Thoughts:  No  Homicidal Thoughts:  No  Memory:  Immediate;   Fair Recent;   Fair Remote;   Fair  Judgement:  Fair  Insight:  Fair  Psychomotor Activity:  Decreased  Concentration:  Concentration: Fair and Attention Span: Fair  Recall:  [employer] of Knowledge:  Fair  Language:  Fair  Akathisia:  No  Handed:  Right  AIMS (if indicated):     Assets:  Communication Skills Desire for Improvement Housing Transportation  ADL's:  Intact  Cognition:  WNL  Sleep:  ok     Assessment/Plan: Paranoid schizophrenia, subchronic condition (HCC) - Plan: fluPHENAZine decanoate (PROLIXIN) 25 MG/ML injection  Tardive dyskinesia - Plan: fluPHENAZine decanoate (PROLIXIN) 25 MG/ML injection  Patient is superficially cooperative and get easily upset and does not want to talk about his symptoms but getting his injection on a regular basis.  I offered to see him in person and he agreed because he does not have the finances to buy technology that has a video camera.  I reviewed blood work results.  His hemoglobin A1c 7.1.  He agreed to come to our office for the visit and injection.  We will follow-up in 3 months in person.   Follow Up Instructions:     I discussed the assessment and treatment plan with the patient. The patient was provided an opportunity to ask questions and all were  answered. The patient agreed with the plan and demonstrated an understanding of the instructions.   The patient was advised to call back or seek an in-person evaluation if the symptoms worsen or if the condition fails to improve as anticipated.    Collaboration of Care: Other provider involved in patient's  care AEB notes are available in epic to review  Patient/Guardian was advised Release of Information must be obtained prior to any record release in order to collaborate their care with an outside provider. Patient/Guardian was advised if they have not already done so to contact the registration department to sign all necessary forms in order for [place] to release information regarding their care.   Consent: Patient/Guardian gives verbal consent for treatment and assignment of benefits for services provided during this visit. Patient/Guardian expressed understanding and agreed to proceed.     I provided 15 minutes of non face to face time during this encounter.  Note: This document was prepared by [employer] voice dictation technology and any errors that results from this process are unintentional.    Cleotis Nipper, MD 07/24/2023

## 2023-07-24 NOTE — Telephone Encounter (Signed)
Medication sent to pharmacy  

## 2023-07-24 NOTE — Progress Notes (Signed)
Patient arrives today for his due injection of Fluphenazine Dec 125 mg/ 5 mL. Patient presents well groomed and in a good mood. We discussed the possibility of different medications, but patient states that he is not comfortable changing anything right now. The injection was prepared as ordered and administered in patients Rt Deltoid. Patient tolerated well and without complaint.    NDC: 1610-9604-54 LOT: [id number] EXP: 2026/06

## 2023-08-07 ENCOUNTER — Ambulatory Visit (HOSPITAL_BASED_OUTPATIENT_CLINIC_OR_DEPARTMENT_OTHER): Payer: 59

## 2023-08-07 ENCOUNTER — Other Ambulatory Visit: Payer: Self-pay

## 2023-08-07 VITALS — BP 184/94 | HR 81 | Ht 72.5 in | Wt 209.0 lb

## 2023-08-07 DIAGNOSIS — F200 Paranoid schizophrenia: Secondary | ICD-10-CM

## 2023-08-07 NOTE — Progress Notes (Signed)
 Patient arrives today for his due injection of Fluphenazine . Patient arrives today well groomed and in a good mood. He has not been feeling well recently, he has been fighting off allergies and a cold. Patient denies SI/HI or AVH. Injection was prepared as ordered and administered in patients Left Deltoid. Patient tolerated well and without complaint.     NDC: 9856-0470-98 LOT: [id number]  EXP: 2026/06

## 2023-08-11 ENCOUNTER — Ambulatory Visit (HOSPITAL_COMMUNITY)
Admission: EM | Admit: 2023-08-11 | Discharge: 2023-08-11 | Disposition: A | Discharge status: Home / Self Care | Payer: 59 | Attending: Physician Assistant | Admitting: Physician Assistant

## 2023-08-11 ENCOUNTER — Encounter (HOSPITAL_COMMUNITY): Payer: Self-pay

## 2023-08-11 DIAGNOSIS — J069 Acute upper respiratory infection, unspecified: Secondary | ICD-10-CM

## 2023-08-11 DIAGNOSIS — R062 Wheezing: Secondary | ICD-10-CM | POA: Diagnosis not present

## 2023-08-11 DIAGNOSIS — R0602 Shortness of breath: Secondary | ICD-10-CM

## 2023-08-11 MED ORDER — PREDNISONE 20 MG PO TABS: ORAL_TABLET | ORAL | 0 refills | Status: DC

## 2023-08-11 MED ORDER — IPRATROPIUM-ALBUTEROL 0.5-2.5 (3) MG/3ML IN SOLN
RESPIRATORY_TRACT | Status: AC
  Filled 2023-08-11: qty 3

## 2023-08-11 MED ORDER — ALBUTEROL SULFATE HFA 108 (90 BASE) MCG/ACT IN AERS: 1.0000 | INHALATION_SPRAY | Freq: Four times a day (QID) | RESPIRATORY_TRACT | 0 refills | Status: DC | PRN

## 2023-08-11 MED ORDER — IPRATROPIUM-ALBUTEROL 0.5-2.5 (3) MG/3ML IN SOLN
3.0000 mL | Freq: Once | RESPIRATORY_TRACT | Status: AC
  Administered 2023-08-11: 3 mL via RESPIRATORY_TRACT

## 2023-08-11 MED ORDER — AZITHROMYCIN 250 MG PO TABS: ORAL_TABLET | ORAL | 0 refills | Status: DC

## 2023-08-11 NOTE — ED Triage Notes (Signed)
 Pt states SOB with cough and congestion  for the past  two weeks states it is worse in the morning.  Pt states he is a smoker.

## 2023-08-11 NOTE — Discharge Instructions (Signed)
 At this time I suspect that you likely have an upper respiratory infection that is causing your shortness of breath.  Since you got better after the nebulizer treatment I am sending you home with prescription for an albuterol  inhaler.  He can use this up to every 6 hours as needed for shortness of breath and coughing.   I have sent in a script for Prednisone  taper to be taken in the morning with breakfast per the instructions on the container Remember that steroids can cause sleeplessness, irritability, increased hunger and elevated glucose levels so be mindful of these side effects. They should lessen as you progress to the lower doses of the taper. I am also sending in a Z-Pak.  This is an antibiotic that helps reduce pulmonary inflammation as well as provides antibiotic coverage for upper respiratory infections.  If you notice that you continue to have shortness of breath, coughing or develop new fevers, significant shortness of breath please return here or go to the emergency room for evaluation and management.

## 2023-08-11 NOTE — ED Provider Notes (Signed)
 [facility]    CSN: [id number] Arrival date & time: 08/11/23  1252      History   Chief Complaint Chief Complaint  Patient presents with   Shortness of Breath    HPI Wayne Schmidt is a 67 y.o. male.   HPI  He states he has been having respiratory concerns for about 2 weeks He states he took Coricidin for about 2 days but did not notice much improvement He reports he is a smoker   He denies previous dx of asthma or COPD  He does not use an inhaler regularly   He reports coughing is productive of phlegm  He reports having fever and chills at the start of his illness but states this has resolved     Past Medical History:  Diagnosis Date   Anginal pain (HCC)    Blood creatinine increased compared with prior measurement 07/29/2019   Diabetes mellitus without complication (HCC)    Enlarged prostate    High risk sexual behavior 10/2007   Hypertension    Microcytosis    Schizophrenia (HCC)    Substance abuse (HCC)    tobacco use   UTI (urinary tract infection) 08/24/2016   Admitted 08/23/16-08/26/16 with several day history of UTI symptoms + positive UA. Culture resulted pan-sensitive E.coli. Was treated with IV Ceftriaxone  and d/c home with Bactrim  to complete 10 day course.     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   Balanitis 11/24/2022   Aortic atherosclerosis (HCC) 01/11/2022   Elevated PSA 01/10/2022   Right shoulder pain 11/11/2019   Plantar fasciitis 06/04/2019   Healthcare maintenance 04/01/2014   Lumbar radiculopathy 08/04/2013   ONYCHOMYCOSIS, TOENAILS 07/07/2010   MICROCYTOSIS 02/24/2010   BPH associated with nocturia 03/24/2009   Coronary artery disease involving native coronary artery of native heart without angina pectoris 11/22/2006   Paranoid schizophrenia, subchronic condition (HCC) 10/11/2006   Hyperlipidemia  08/28/2006   Type 2 diabetes mellitus with other specified complication (HCC) 04/27/2006   TOBACCO ABUSE 04/27/2006   Essential hypertension 04/27/2006    Past Surgical History:  Procedure Laterality Date   ARTHROSCOPY WITH ANTERIOR CRUCIATE LIGAMENT (ACL) REPAIR WITH ANTERIOR TIBILIAS GRAFT Right    CIRCUMCISION     LAMINECTOMY     by Dr Drury   PROSTATE SURGERY         Home Medications    Prior to Admission medications   Medication Sig Start Date End Date Taking? Authorizing Provider  albuterol  (VENTOLIN  HFA) 108 (90 Base) MCG/ACT inhaler Inhale 1-2 puffs into the lungs every 6 (six) hours as needed for wheezing or shortness of breath. 08/11/23  Yes Alyze Lauf E, PA-C  azithromycin  (ZITHROMAX ) 250 MG tablet Take 500mg  PO daily x1d and then 250mg  daily x4 days 08/11/23  Yes Toben Acuna E, PA-C  predniSONE  (DELTASONE ) 20 MG tablet Take 60mg  PO daily x 2 days, then40mg  PO daily x 2 days, then 20mg  PO daily x 3 days 08/11/23  Yes Diarra Ceja E, PA-C  amLODipine -olmesartan  (AZOR ) 5-40 MG tablet Take 1 tablet by mouth daily. 06/21/23   Rosan Dayton BROCKS, DO  empagliflozin  (JARDIANCE ) 10 MG TABS tablet Take 1 tablet (10 mg total) by mouth daily before breakfast. 07/24/23   Rosan Dayton BROCKS, DO  fluPHENAZine  decanoate (PROLIXIN ) 25 MG/ML injection Inject 1 mL (25 mg total) into the muscle every 14 (fourteen) days. 07/24/23   Arfeen,  Leni DASEN, MD  fluticasone  (FLONASE ) 50 MCG/ACT nasal spray Place 1 spray into both nostrils daily. Patient not taking: Reported on 05/06/2023 08/24/22   Rosan Dayton BROCKS, DO  rosuvastatin  (CRESTOR ) 20 MG tablet TAKE 1 TABLET BY MOUTH ONCE DAILY 07/24/23   Rosan Dayton BROCKS, DO    Family History Family History  Problem Relation Age of Onset   Diabetes Mother    Hypertension Mother    Lupus Sister    Sickle cell anemia Brother    Drug abuse Brother     Social History Social History   Tobacco Use   Smoking status: Every Day    Current packs/day: 1.00    Average  packs/day: 1 pack/day for 38.0 years (38.0 ttl pk-yrs)    Types: Cigarettes   Smokeless tobacco: Never  Vaping Use   Vaping status: Never Used  Substance Use Topics   Alcohol use: Yes    Comment: Seldom.   Drug use: No    Comment: h/o remote cocaine use-denies present use     Allergies   Ace inhibitors, Atorvastatin, Chlorpromazine hcl, Metformin  and related, and Simvastatin   Review of Systems Review of Systems  Constitutional:  Negative for chills and fever.  Respiratory:  Positive for cough and shortness of breath.      Physical Exam Triage Vital Signs ED Triage Vitals  Encounter Vitals Group     BP 08/11/23 1334 (!) 146/77     Systolic BP Percentile --      Diastolic BP Percentile --      Pulse Rate 08/11/23 1334 89     Resp 08/11/23 1334 16     Temp 08/11/23 1334 97.9 F (36.6 C)     Temp Source 08/11/23 1334 Oral     SpO2 08/11/23 1334 90 %     Weight --      Height --      Head Circumference --      Peak Flow --      Pain Score 08/11/23 1333 0     Pain Loc --      Pain Education --      Exclude from Growth Chart --    No data found.  Updated Vital Signs BP (!) 146/77 (BP Location: Left Arm)   Pulse 89   Temp 97.9 F (36.6 C) (Oral)   Resp 16   SpO2 95%   Visual Acuity Right Eye Distance:   Left Eye Distance:   Bilateral Distance:    Right Eye Near:   Left Eye Near:    Bilateral Near:     Physical Exam Vitals reviewed.  Constitutional:      General: He is awake. He is not in acute distress.    Appearance: Normal appearance. He is well-developed and well-groomed. He is not ill-appearing.  HENT:     Head: Normocephalic and atraumatic.  Cardiovascular:     Rate and Rhythm: Normal rate and regular rhythm.     Heart sounds: Normal heart sounds. No murmur heard.    No friction rub. No gallop.  Pulmonary:     Effort: Pulmonary effort is normal.     Breath sounds: No decreased air movement. Examination of the right-upper field reveals  wheezing and rhonchi. Examination of the right-middle field reveals wheezing and rhonchi. Examination of the right-lower field reveals wheezing and rhonchi. Wheezing and rhonchi present. No decreased breath sounds.  Neurological:     Mental Status: He is alert.  Psychiatric:  Attention and Perception: Attention normal.        Mood and Affect: Mood normal.        Speech: Speech normal.        Behavior: Behavior normal. Behavior is cooperative.      UC Treatments / Results  Labs (all labs ordered are listed, but only abnormal results are displayed) Labs Reviewed - No data to display  EKG   Radiology No results found.  Procedures Procedures (including critical care time)  Medications Ordered in UC Medications  ipratropium-albuterol  (DUONEB) 0.5-2.5 (3) MG/3ML nebulizer solution 3 mL (3 mLs Nebulization Given 08/11/23 1528)    Initial Impression / Assessment and Plan / UC Course  I have reviewed the triage vital signs and the nursing notes.  Pertinent labs & imaging results that were available during my care of the patient were reviewed by me and considered in my medical decision making (see chart for details).     Pt presents today with SOB and coughing for the past 2 weeks. Oxygen sat <90% but with deep breathing was able to get it up to 93% several times. Mostly staying at 90% during time with provider in room  There is wheezing and rhonchi along right side of lungs. Will get duoneb and reassess after treatment. If no improvement may need CXR.  Final Clinical Impressions(s) / UC Diagnoses   Final diagnoses:  Shortness of breath  Upper respiratory tract infection, unspecified type   Acute, new concern Patient presents today with shortness of breath, mild hypoxia.  After DuoNeb treatment was administered oxygen saturation improved to 95 to 97% on room air.  Suspect that he likely has a URI that is causing potential bronchitis but cannot rule out COPD exacerbation or  asthma exacerbation at this time.  Will send him home with albuterol  inhaler, prednisone  taper, Z-Pak for further management.  ED and return precautions reviewed and provided in after visit summary. Follow up as needed.    Discharge Instructions      At this time I suspect that you likely have an upper respiratory infection that is causing your shortness of breath.  Since you got better after the nebulizer treatment I am sending you home with prescription for an albuterol  inhaler.  He can use this up to every 6 hours as needed for shortness of breath and coughing.   I have sent in a script for Prednisone  taper to be taken in the morning with breakfast per the instructions on the container Remember that steroids can cause sleeplessness, irritability, increased hunger and elevated glucose levels so be mindful of these side effects. They should lessen as you progress to the lower doses of the taper. I am also sending in a Z-Pak.  This is an antibiotic that helps reduce pulmonary inflammation as well as provides antibiotic coverage for upper respiratory infections.  If you notice that you continue to have shortness of breath, coughing or develop new fevers, significant shortness of breath please return here or go to the emergency room for evaluation and management.     ED Prescriptions     Medication Sig Dispense Auth. Provider   predniSONE  (DELTASONE ) 20 MG tablet Take 60mg  PO daily x 2 days, then40mg  PO daily x 2 days, then 20mg  PO daily x 3 days 13 tablet Taegan Standage E, PA-C   albuterol  (VENTOLIN  HFA) 108 (90 Base) MCG/ACT inhaler Inhale 1-2 puffs into the lungs every 6 (six) hours as needed for wheezing or shortness of breath. 8 g  Darryll Raju E, PA-C   azithromycin  (ZITHROMAX ) 250 MG tablet Take 500mg  PO daily x1d and then 250mg  daily x4 days 6 each Ashima Shrake E, PA-C      PDMP not reviewed this encounter.   Marylene Rocky BRAVO, PA-C 08/11/23 1548

## 2023-08-21 ENCOUNTER — Other Ambulatory Visit: Payer: Self-pay

## 2023-08-21 ENCOUNTER — Encounter (HOSPITAL_COMMUNITY): Payer: Self-pay

## 2023-08-21 ENCOUNTER — Ambulatory Visit (HOSPITAL_BASED_OUTPATIENT_CLINIC_OR_DEPARTMENT_OTHER): Payer: 59

## 2023-08-21 VITALS — BP 161/85 | HR 90 | Ht 72.5 in | Wt 215.0 lb

## 2023-08-21 DIAGNOSIS — F200 Paranoid schizophrenia: Secondary | ICD-10-CM | POA: Diagnosis not present

## 2023-08-21 NOTE — Progress Notes (Signed)
Patient arrived today for his due injection of Fluphenazine Dec. 125 mg / mL. Patient arrived in a good mood and well groomed. Patient is looking forward to better weather, he is not liking the cold. He has an appointment coming up with Dr. Lolly Mustache and it is in person, patient is slightly nervous about this as he has not seen Dr. Lolly Mustache In a long time. The injection was prepared as ordered and administered in patients Right Deltoid. Patient tolerated well and without complaint and will return in 2 weeks for his next due injection   NDC: 4098-1191-47 LOT: [id number] EXP: 2026/06

## 2023-09-04 ENCOUNTER — Ambulatory Visit (HOSPITAL_COMMUNITY): Payer: 59

## 2023-09-04 ENCOUNTER — Ambulatory Visit (HOSPITAL_BASED_OUTPATIENT_CLINIC_OR_DEPARTMENT_OTHER)

## 2023-09-04 ENCOUNTER — Encounter (HOSPITAL_COMMUNITY): Payer: Self-pay

## 2023-09-04 VITALS — BP 173/97 | HR 76 | Ht 72.5 in | Wt 209.0 lb

## 2023-09-04 DIAGNOSIS — F200 Paranoid schizophrenia: Secondary | ICD-10-CM

## 2023-09-04 NOTE — Progress Notes (Signed)
 Patient arrived today for his due injection of Prolixin 25 mg / mL. Patient is well groomed and in a good mood. Patient denies any SI/HI or AVH. Injection was prepared as ordered and administered in patients Left Deltoid. Patient tolerated well and without complaint.   NDC: 16109-604-54 LOT: [id number] EXP: 2026/JUL

## 2023-09-18 ENCOUNTER — Ambulatory Visit (HOSPITAL_BASED_OUTPATIENT_CLINIC_OR_DEPARTMENT_OTHER): Admitting: *Deleted

## 2023-09-18 VITALS — BP 197/98 | HR 79 | Ht 72.5 in | Wt 214.0 lb

## 2023-09-18 DIAGNOSIS — F200 Paranoid schizophrenia: Secondary | ICD-10-CM | POA: Diagnosis not present

## 2023-09-18 NOTE — Patient Instructions (Signed)
 Pt presents to office today for due Prolixin 25 mg injection. Pt appears to be at baseline with hyper religiosity, tangential. Pt will brighten and laugh at times but does not maintain this affect. Eye contact is intense at times. It is difficult to determine, from pt, whether or nor he is experiencing AVH. Denies SI and HI. Pt says the injection "is bad for me" but then agreed it does help him. Injection was prepared as ordered and given in RD without complaint. Pt to return in 2 weeks for next due injection.

## 2023-09-20 ENCOUNTER — Encounter: Payer: Self-pay | Admitting: Internal Medicine

## 2023-09-20 ENCOUNTER — Ambulatory Visit: Admitting: Internal Medicine

## 2023-09-20 VITALS — BP 149/84 | HR 75 | Temp 97.9°F | Ht 73.0 in | Wt 213.8 lb

## 2023-09-20 DIAGNOSIS — E1169 Type 2 diabetes mellitus with other specified complication: Secondary | ICD-10-CM | POA: Diagnosis not present

## 2023-09-20 DIAGNOSIS — Z7984 Long term (current) use of oral hypoglycemic drugs: Secondary | ICD-10-CM | POA: Diagnosis not present

## 2023-09-20 DIAGNOSIS — E785 Hyperlipidemia, unspecified: Secondary | ICD-10-CM | POA: Diagnosis not present

## 2023-09-20 DIAGNOSIS — I10 Essential (primary) hypertension: Secondary | ICD-10-CM

## 2023-09-20 DIAGNOSIS — E782 Mixed hyperlipidemia: Secondary | ICD-10-CM

## 2023-09-20 LAB — POCT GLYCOSYLATED HEMOGLOBIN (HGB A1C): Hemoglobin A1C: 7.4 % — AB (ref 4.0–5.6)

## 2023-09-20 LAB — GLUCOSE, CAPILLARY: Glucose-Capillary: 132 mg/dL — ABNORMAL HIGH (ref 70–99)

## 2023-09-20 NOTE — Assessment & Plan Note (Signed)
 Appears he is not taking his medications as prescribed.  I strongly recommended he take his Crestor daily discussed the plan to break up his medications so that he takes the Crestor at night.

## 2023-09-20 NOTE — Progress Notes (Signed)
 Established Patient Office Visit  Subjective   Patient ID: Wayne Schmidt, male    DOB: [birth year]  Age: 68 y.o. MRN: [id number]  Chief Complaint  Patient presents with   Follow-up    Questions about his medications. A1C.   Here to follow up dm and HTN.  He brings his medications from his pharmacy.  These are in a sealed bag and contain blister packs of his chronic medications-10 mg Jardiance Azor 5-40 and 20 mg Crestor.  He thinks he takes too many medications ask if he can take them only as needed.  Reports he has faith in God to cure him of his diseases.       Objective:     BP (!) 149/84 (BP Location: Right Arm, Patient Position: Sitting, Cuff Size: Large)   Pulse 75   Temp 97.9 F (36.6 C) (Oral)   Ht 6\' 1"  (1.854 m)   Wt 213 lb 12.8 oz (97 kg)   SpO2 97% Comment: RA  BMI 28.21 kg/m  BP Readings from Last 3 Encounters:  09/20/23 (!) 149/84  08/11/23 (!) 146/77  07/18/23 (!) 159/84   Wt Readings from Last 3 Encounters:  09/20/23 213 lb 12.8 oz (97 kg)  06/21/23 219 lb 1.6 oz (99.4 kg)  05/06/23 211 lb 10.3 oz (96 kg)      Physical Exam Vitals and nursing note reviewed.  Cardiovascular:     Rate and Rhythm: Normal rate and regular rhythm.  Psychiatric:        Mood and Affect: Mood normal.      Results for orders placed or performed in visit on 09/20/23  Glucose, capillary  Result Value Ref Range   Glucose-Capillary 132 (H) 70 - 99 mg/dL  POC Hbg W0J  Result Value Ref Range   Hemoglobin A1C 7.4 (A) 4.0 - 5.6 %   HbA1c POC (<> result, manual entry)     HbA1c, POC (prediabetic range)     HbA1c, POC (controlled diabetic range)      Last metabolic panel Lab Results  Component Value Date   GLUCOSE 102 (H) 03/15/2023   NA 142 03/15/2023   K 4.6 03/15/2023   CL 103 03/15/2023   CO2 23 03/15/2023   BUN 13 03/15/2023   CREATININE 1.16 03/15/2023   EGFR 69 03/15/2023   CALCIUM 9.4 03/15/2023   PROT 7.1 12/01/2022   ALBUMIN 4.1 12/01/2022    LABGLOB 2.4 09/20/2021   AGRATIO 1.9 09/20/2021   BILITOT 0.6 12/01/2022   ALKPHOS 67 12/01/2022   AST 28 12/01/2022   ALT 21 12/01/2022   ANIONGAP 10 12/01/2022   Last lipids Lab Results  Component Value Date   CHOL 154 03/15/2023   HDL 74 03/15/2023   LDLCALC 69 03/15/2023   TRIG 49 03/15/2023   CHOLHDL 2.1 03/15/2023   Last hemoglobin A1c Lab Results  Component Value Date   HGBA1C 7.4 (A) 09/20/2023      The 10-year ASCVD risk score (Arnett DK, et al., 2019) is: 50.8%    Assessment & Plan:   Problem List Items Addressed This Visit       Cardiovascular and Mediastinum   Essential hypertension (Chronic)   Discussed the importance of taking his antihypertensive medications chronically to prevent the risk of ASCVD events.  Discussed breaking up his medications he will take the antihypertensive midday        Endocrine   Type 2 diabetes mellitus with other specified complication (HCC) - Primary   We reviewed  his A1c which is uncontrolled, discussed with him the need to take Jardiance every day.  I asked that he take this medication in the morning.  Discussed with him the importance of staying well-hydrated while on this medication.  Encouraged him to increase from two bottles of water to 4 a day.      Relevant Orders   POC Hbg A1C (Completed)   Glucose, capillary (Completed)     Other   Hyperlipidemia (Chronic)   Appears he is not taking his medications as prescribed.  I strongly recommended he take his Crestor daily discussed the plan to break up his medications so that he takes the Crestor at night.       Return in about 3 months (around 12/21/2023) for DM, HTN.    Gust Rung, DO

## 2023-09-20 NOTE — Assessment & Plan Note (Addendum)
 We reviewed his A1c which is uncontrolled, discussed with him the need to take Jardiance every day.  I asked that he take this medication in the morning.  Discussed with him the importance of staying well-hydrated while on this medication.  Encouraged him to increase from two bottles of water to 4 a day.

## 2023-09-20 NOTE — Assessment & Plan Note (Signed)
 Discussed the importance of taking his antihypertensive medications chronically to prevent the risk of ASCVD events.  Discussed breaking up his medications he will take the antihypertensive midday

## 2023-10-01 ENCOUNTER — Ambulatory Visit

## 2023-10-01 ENCOUNTER — Telehealth: Payer: Self-pay

## 2023-10-01 DIAGNOSIS — Z0000 Encounter for general adult medical examination without abnormal findings: Secondary | ICD-10-CM | POA: Diagnosis not present

## 2023-10-01 NOTE — Progress Notes (Signed)
 Subjective:   Wayne Schmidt is a 68 y.o. male who presents for Medicare Annual/Subsequent preventive examination.  Visit Complete: Virtual I connected with  [name] on 10/01/23 by a video and audio enabled telemedicine application and verified that I am speaking with the correct person using two identifiers.  Patient Location: Home  Provider Location: Office/Clinic  I discussed the limitations of evaluation and management by telemedicine. The patient expressed understanding and agreed to proceed.  Vital Signs: Because this visit was a virtual/telehealth visit, some criteria may be missing or patient reported. Any vitals not documented were not able to be obtained and vitals that have been documented are patient reported.   Cardiac Risk Factors include: advanced age (>64men, >59 women);diabetes mellitus;dyslipidemia;hypertension     Objective:    There were no vitals filed for this visit. There is no height or weight on file to calculate BMI.     10/01/2023    9:44 AM 09/20/2023   10:09 AM 05/06/2023    5:37 PM 04/13/2023   10:28 AM 03/28/2023    2:16 PM 03/15/2023   10:58 PM 03/15/2023   10:26 AM  Advanced Directives  Does Patient Have a Medical Advance Directive? No No No No No No No  Does patient want to make changes to medical advance directive?      No - Patient declined No - Patient declined  Would patient like information on creating a medical advance directive? No - Patient declined No - Patient declined No - Patient declined No - Patient declined  No - Patient declined No - Patient declined    Current Medications (verified) Outpatient Encounter Medications as of 10/01/2023  Medication Sig   albuterol (VENTOLIN HFA) 108 (90 Base) MCG/ACT inhaler Inhale 1-2 puffs into the lungs every 6 (six) hours as needed for wheezing or shortness of breath.   amLODipine-olmesartan (AZOR) 5-40 MG tablet Take 1 tablet by mouth daily.   empagliflozin (JARDIANCE) 10 MG TABS tablet  Take 1 tablet (10 mg total) by mouth daily before breakfast.   fluPHENAZine decanoate (PROLIXIN) 25 MG/ML injection Inject 1 mL (25 mg total) into the muscle every 14 (fourteen) days.   fluticasone (FLONASE) 50 MCG/ACT nasal spray Place 1 spray into both nostrils daily.   rosuvastatin (CRESTOR) 20 MG tablet TAKE 1 TABLET BY MOUTH ONCE DAILY   No facility-administered encounter medications on file as of 10/01/2023.    Allergies (verified) Ace inhibitors, Atorvastatin, Chlorpromazine hcl, Metformin and related, and Simvastatin   History: Past Medical History:  Diagnosis Date   Anginal pain (HCC)    Blood creatinine increased compared with prior measurement 07/29/2019   Diabetes mellitus without complication (HCC)    Enlarged prostate    High risk sexual behavior 10/2007   Hypertension    Microcytosis    Schizophrenia (HCC)    Substance abuse (HCC)    tobacco use   UTI (urinary tract infection) 08/24/2016   Admitted 08/23/16-08/26/16 with several day history of UTI symptoms + positive UA. Culture resulted pan-sensitive E.coli. Was treated with IV Ceftriaxone and d/c home with Bactrim to complete 10 day course.    Past Surgical History:  Procedure Laterality Date   ARTHROSCOPY WITH ANTERIOR CRUCIATE LIGAMENT (ACL) REPAIR WITH ANTERIOR TIBILIAS GRAFT Right    CIRCUMCISION     LAMINECTOMY     by Dr Elesa Hacker   PROSTATE SURGERY     Family History  Problem Relation Age of Onset   Diabetes Mother    Hypertension Mother  Lupus Sister    Sickle cell anemia Brother    Drug abuse Brother    Social History   Socioeconomic History   Marital status: Legally Separated    Spouse name: Not on file   Number of children: Not on file   Years of education: Not on file   Highest education level: Not on file  Occupational History   Not on file  Tobacco Use   Smoking status: Every Day    Current packs/day: 1.00    Average packs/day: 1 pack/day for 38.0 years (38.0 ttl pk-yrs)    Types:  Cigarettes   Smokeless tobacco: Never  Vaping Use   Vaping status: Never Used  Substance and Sexual Activity   Alcohol use: Yes    Comment: Seldom.   Drug use: No    Comment: h/o remote cocaine use-denies present use   Sexual activity: Not Currently  Other Topics Concern   Not on file  Social History Narrative   Patient given diabetes card 07/07/2010   Social Drivers of Health   Financial Resource Strain: Low Risk  (10/01/2023)   Overall Financial Resource Strain (CARDIA)    Difficulty of Paying Living Expenses: Not hard at all  Food Insecurity: No Food Insecurity (10/01/2023)   Hunger Vital Sign    Worried About Running Out of Food in the Last Year: Never true    Ran Out of Food in the Last Year: Never true  Transportation Needs: No Transportation Needs (10/01/2023)   PRAPARE - Administrator, Civil Service (Medical): No    Lack of Transportation (Non-Medical): No  Physical Activity: Insufficiently Active (10/01/2023)   Exercise Vital Sign    Days of Exercise per Week: 4 days    Minutes of Exercise per Session: 20 min  Stress: No Stress Concern Present (10/01/2023)   [employer] of Occupational Health - Occupational Stress Questionnaire    Feeling of Stress : Not at all  Social Connections: Moderately Integrated (10/01/2023)   Social Connection and Isolation Panel [NHANES]    Frequency of Communication with Friends and Family: More than three times a week    Frequency of Social Gatherings with Friends and Family: More than three times a week    Attends Religious Services: More than 4 times per year    Active Member of [employer] or Organizations: Yes    Attends Engineer, structural: More than 4 times per year    Marital Status: Separated    Tobacco Counseling Ready to quit: Not Answered Counseling given: Not Answered   Clinical Intake:  Pre-visit preparation completed: Yes  Pain : No/denies pain     Diabetes: Yes CBG done?: No CBG resulted in  Enter/ Edit results?: No  How often do you need to have someone help you when you read instructions, pamphlets, or other written materials from your doctor or pharmacy?: 1 - Never What is the last grade level you completed in school?: COLLEGE     Information entered by :: [facility]   Activities of Daily Living    10/01/2023    9:36 AM 04/13/2023    8:00 PM  In your present state of health, do you have any difficulty performing the following activities:  Hearing? 0 0  Vision? 0 0  Difficulty concentrating or making decisions? 0 0  Walking or climbing stairs? 0   Dressing or bathing? 1   Doing errands, shopping? 0 0  Preparing Food and eating ? N   Using  the Toilet? N   In the past six months, have you accidently leaked urine? N   Do you have problems with loss of bowel control? N   Managing your Medications? N   Managing your Finances? N   Housekeeping or managing your Housekeeping? N     Patient Care Team: Gust Rung, DO as PCP - General (Internal Medicine)  Indicate any recent Medical Services you may have received from other than [facility] providers in the past year (date may be approximate).     Assessment:   This is a routine wellness examination for Wayne Schmidt.  Hearing/Vision screen No results found.   Goals Addressed   None   Depression Screen    10/01/2023    9:42 AM 09/20/2023   10:09 AM 03/15/2023   10:29 AM 08/24/2022   10:11 AM 05/02/2022   11:07 AM 01/10/2022   10:04 AM 09/20/2021   12:28 PM  PHQ 2/9 Scores  PHQ - 2 Score 0 0 0 0 0 0 0    Fall Risk    10/01/2023    9:44 AM 09/20/2023   10:09 AM 06/21/2023   10:31 AM 03/15/2023   10:26 AM 11/23/2022   11:08 AM  Fall Risk   Falls in the past year? 0 0 0 0 0  Number falls in past yr: 0 0 0 0 0  Injury with Fall? 0 0 0 0 0  Risk for fall due to : No Fall Risks No Fall Risks No Fall Risks No Fall Risks No Fall Risks  Follow up Falls prevention discussed;Falls evaluation completed Falls prevention  discussed Falls evaluation completed Falls evaluation completed;Falls prevention discussed Falls evaluation completed;Falls prevention discussed    MEDICARE RISK AT HOME: Medicare Risk at Home Any stairs in or around the home?: Yes If so, are there any without handrails?: No Home free of loose throw rugs in walkways, pet beds, electrical cords, etc?: Yes Adequate lighting in your home to reduce risk of falls?: Yes Life alert?: No Use of a cane, walker or w/c?: No Grab bars in the bathroom?: Yes Shower chair or bench in shower?: No Elevated toilet seat or a handicapped toilet?: No  TIMED UP AND GO:  Was the test performed?  No    Cognitive Function:        10/01/2023    9:47 AM  6CIT Screen  What Year? 0 points  What month? 0 points  What time? 0 points  Count back from 20 0 points  Months in reverse 0 points  Repeat phrase 0 points  Total Score 0 points    Immunizations Immunization History  Administered Date(s) Administered   Fluad Quad(high Dose 65+) 05/02/2022   Fluad Trivalent(High Dose 65+) 03/15/2023   Influenza Split 04/06/2011, 04/11/2012   Influenza Whole 05/28/2006, 06/11/2007, 04/08/2008, 03/24/2009, 05/02/2010   Influenza,inj,Quad PF,6+ Mos 04/14/2013, 04/01/2014, 04/08/2015, 04/11/2016, 03/20/2017, 02/26/2018, 04/15/2019, 03/16/2020, 06/14/2021   PFIZER(Purple Top)SARS-COV-2 Vaccination 11/28/2019, 12/19/2019   Pfizer Covid-19 Vaccine Bivalent Booster 63yrs & up 10/08/2020, 02/07/2021, 11/22/2021   Pneumococcal Conjugate-13 06/22/2020   Pneumococcal Polysaccharide-23 05/04/2011, 08/07/2017   Td 09/20/2021   Tdap 05/04/2011    TDAP status: Up to date  Flu Vaccine status: Up to date  Pneumococcal vaccine status: Due, Education has been provided regarding the importance of this vaccine. Advised may receive this vaccine at local pharmacy or Health Dept. Aware to provide a copy of the vaccination record if obtained from local pharmacy or Health Dept.  Verbalized acceptance and  understanding.  Covid-19 vaccine status: Completed vaccines  Qualifies for Shingles Vaccine? Yes   Zostavax completed No   Shingrix Completed?: No.    Education has been provided regarding the importance of this vaccine. Patient has been advised to call insurance company to determine out of pocket expense if they have not yet received this vaccine. Advised may also receive vaccine at local pharmacy or Health Dept. Verbalized acceptance and understanding.  Screening Tests Health Maintenance  Topic Date Due   Zoster Vaccines- Shingrix (1 of 2) Never done   Lung Cancer Screening  05/30/2022   Pneumonia Vaccine 95+ Years old (3 of 3 - PPSV23 or PCV20) 08/07/2022   COVID-19 Vaccine (6 - 2024-25 season) 03/04/2023   FOOT EXAM  11/23/2023   HEMOGLOBIN A1C  12/21/2023   Diabetic kidney evaluation - eGFR measurement  03/14/2024   LIPID PANEL  03/14/2024   OPHTHALMOLOGY EXAM  05/22/2024   Diabetic kidney evaluation - Urine ACR  06/20/2024   Medicare Annual Wellness (AWV)  09/30/2024   Colonoscopy  11/16/2024   DTaP/Tdap/Td (3 - Td or Tdap) 09/21/2031   INFLUENZA VACCINE  Completed   Hepatitis C Screening  Completed   HPV VACCINES  Aged Out    Health Maintenance  Health Maintenance Due  Topic Date Due   Zoster Vaccines- Shingrix (1 of 2) Never done   Lung Cancer Screening  05/30/2022   Pneumonia Vaccine 27+ Years old (3 of 3 - PPSV23 or PCV20) 08/07/2022   COVID-19 Vaccine (6 - 2024-25 season) 03/04/2023    Colorectal cancer screening: Type of screening: Colonoscopy. Completed 09/20/2021. Repeat every 10 years  Lung Cancer Screening: (Low Dose CT Chest recommended if Age 73-80 years, 20 pack-year currently smoking OR have quit w/in 15years.) does qualify.   Lung Cancer Screening Referral: DEFERRED TO PCP OVER DUE   Additional Screening:  Hepatitis C Screening: does qualify; Completed 10/13/2015  Vision Screening: Recommended annual ophthalmology exams for  early detection of glaucoma and other disorders of the eye. Is the patient up to date with their annual eye exam?  Yes  Who is the provider or what is the name of the office in which the patient attends annual eye exams? [facility] If pt is not established with a provider, would they like to be referred to a provider to establish care? No .   Dental Screening: Recommended annual dental exams for proper oral hygiene  Diabetic Foot Exam: Diabetic Foot Exam: Completed 11/23/2022  Community Resource Referral / Chronic Care Management: CRR required this visit?  No   CCM required this visit?  No     Plan:     I have personally reviewed and noted the following in the patient's chart:   Medical and social history Use of alcohol, tobacco or illicit drugs  Current medications and supplements including opioid prescriptions. Patient is not currently taking opioid prescriptions. Functional ability and status Nutritional status Physical activity Advanced directives List of other physicians Hospitalizations, surgeries, and ER visits in previous 12 months Vitals Screenings to include cognitive, depression, and falls Referrals and appointments  In addition, I have reviewed and discussed with patient certain preventive protocols, quality metrics, and best practice recommendations. A written personalized care plan for preventive services as well as general preventive health recommendations were provided to patient.     Derrell Lolling, CMA   10/01/2023   After Visit Summary: (Mail) Due to this being a telephonic visit, the after visit summary with patients personalized plan was  offered to patient via mail   Nurse Notes: NON FACE TO FACE   Wayne Schmidt , Thank you for taking time to come for your Medicare Wellness Visit. I appreciate your ongoing commitment to your health goals. Please review the following plan we discussed and let me know if I can assist you in the future.   These are the  goals we discussed:  Goals      Blood Pressure < 140/90     HEMOGLOBIN A1C < 7.0     LDL CALC < 100        This is a list of the screening recommended for you and due dates:  Health Maintenance  Topic Date Due   Zoster (Shingles) Vaccine (1 of 2) Never done   Screening for Lung Cancer  05/30/2022   Pneumonia Vaccine (3 of 3 - PPSV23 or PCV20) 08/07/2022   COVID-19 Vaccine (6 - 2024-25 season) 03/04/2023   Complete foot exam   11/23/2023   Hemoglobin A1C  12/21/2023   Yearly kidney function blood test for diabetes  03/14/2024   Lipid (cholesterol) test  03/14/2024   Eye exam for diabetics  05/22/2024   Yearly kidney health urinalysis for diabetes  06/20/2024   Medicare Annual Wellness Visit  09/30/2024   Colon Cancer Screening  11/16/2024   DTaP/Tdap/Td vaccine (3 - Td or Tdap) 09/21/2031   Flu Shot  Completed   Hepatitis C Screening  Completed   HPV Vaccine  Aged Out

## 2023-10-01 NOTE — Telephone Encounter (Signed)
 I did a AWV with pt  via tele .Marland Kitchen Pt stated he is not feeling well he is very weak I  offered pt an appt to come in or to be called by a DR  due to he already has an upcoming appt with PCP in June .Marland Kitchen But pt denied  he state dhe has been taking OTC  med  and is starting to feel a little better.. I did offer also for a RN to call and check on him he agreed to that

## 2023-10-01 NOTE — Patient Instructions (Signed)
 Health Maintenance, Male  Adopting a healthy lifestyle and getting preventive care are important in promoting health and wellness. Ask your health care provider about:  The right schedule for you to have regular tests and exams.  Things you can do on your own to prevent diseases and keep yourself healthy.  What should I know about diet, weight, and exercise?  Eat a healthy diet    Eat a diet that includes plenty of vegetables, fruits, low-fat dairy products, and lean protein.  Do not eat a lot of foods that are high in solid fats, added sugars, or sodium.  Maintain a healthy weight  Body mass index (BMI) is a measurement that can be used to identify possible weight problems. It estimates body fat based on height and weight. Your health care provider can help determine your BMI and help you achieve or maintain a healthy weight.  Get regular exercise  Get regular exercise. This is one of the most important things you can do for your health. Most adults should:  Exercise for at least 150 minutes each week. The exercise should increase your heart rate and make you sweat (moderate-intensity exercise).  Do strengthening exercises at least twice a week. This is in addition to the moderate-intensity exercise.  Spend less time sitting. Even light physical activity can be beneficial.  Watch cholesterol and blood lipids  Have your blood tested for lipids and cholesterol at 69 years of age, then have this test every 5 years.  You may need to have your cholesterol levels checked more often if:  Your lipid or cholesterol levels are high.  You are older than 69 years of age.  You are at high risk for heart disease.  What should I know about cancer screening?  Many types of cancers can be detected early and may often be prevented. Depending on your health history and family history, you may need to have cancer screening at various ages. This may include screening for:  Colorectal cancer.  Prostate cancer.  Skin cancer.  Lung  cancer.  What should I know about heart disease, diabetes, and high blood pressure?  Blood pressure and heart disease  High blood pressure causes heart disease and increases the risk of stroke. This is more likely to develop in people who have high blood pressure readings or are overweight.  Talk with your health care provider about your target blood pressure readings.  Have your blood pressure checked:  Every 3-5 years if you are 9-95 years of age.  Every year if you are 85 years old or older.  If you are between the ages of 29 and 29 and are a current or former smoker, ask your health care provider if you should have a one-time screening for abdominal aortic aneurysm (AAA).  Diabetes  Have regular diabetes screenings. This checks your fasting blood sugar level. Have the screening done:  Once every three years after age 23 if you are at a normal weight and have a low risk for diabetes.  More often and at a younger age if you are overweight or have a high risk for diabetes.  What should I know about preventing infection?  Hepatitis B  If you have a higher risk for hepatitis B, you should be screened for this virus. Talk with your health care provider to find out if you are at risk for hepatitis B infection.  Hepatitis C  Blood testing is recommended for:  Everyone born from [birth year] through 1966.  Anyone  with known risk factors for hepatitis C.  Sexually transmitted infections (STIs)  You should be screened each year for STIs, including gonorrhea and chlamydia, if:  You are sexually active and are younger than 69 years of age.  You are older than 69 years of age and your health care provider tells you that you are at risk for this type of infection.  Your sexual activity has changed since you were last screened, and you are at increased risk for chlamydia or gonorrhea. Ask your health care provider if you are at risk.  Ask your health care provider about whether you are at high risk for HIV. Your health care provider  may recommend a prescription medicine to help prevent HIV infection. If you choose to take medicine to prevent HIV, you should first get tested for HIV. You should then be tested every 3 months for as long as you are taking the medicine.  Follow these instructions at home:  Alcohol use  Do not drink alcohol if your health care provider tells you not to drink.  If you drink alcohol:  Limit how much you have to 0-2 drinks a day.  Know how much alcohol is in your drink. In the [place] one drink equals one 12 oz bottle of beer (355 mL), one 5 oz glass of wine (148 mL), or one 1 oz glass of hard liquor (44 mL).  Lifestyle  Do not use any products that contain nicotine or tobacco. These products include cigarettes, chewing tobacco, and vaping devices, such as e-cigarettes. If you need help quitting, ask your health care provider.  Do not use street drugs.  Do not share needles.  Ask your health care provider for help if you need support or information about quitting drugs.  General instructions  Schedule regular health, dental, and eye exams.  Stay current with your vaccines.  Tell your health care provider if:  You often feel depressed.  You have ever been abused or do not feel safe at home.  Summary  Adopting a healthy lifestyle and getting preventive care are important in promoting health and wellness.  Follow your health care provider's instructions about healthy diet, exercising, and getting tested or screened for diseases.  Follow your health care provider's instructions on monitoring your cholesterol and blood pressure.  This information is not intended to replace advice given to you by your health care provider. Make sure you discuss any questions you have with your health care provider.  Document Revised: 11/08/2020 Document Reviewed: 11/08/2020  Elsevier Patient Education  2024 [employer].

## 2023-10-02 ENCOUNTER — Ambulatory Visit (HOSPITAL_BASED_OUTPATIENT_CLINIC_OR_DEPARTMENT_OTHER): Admitting: *Deleted

## 2023-10-02 VITALS — BP 186/104 | HR 79 | Resp 18 | Ht 73.0 in | Wt 213.6 lb

## 2023-10-02 DIAGNOSIS — G2401 Drug induced subacute dyskinesia: Secondary | ICD-10-CM

## 2023-10-02 DIAGNOSIS — F200 Paranoid schizophrenia: Secondary | ICD-10-CM | POA: Diagnosis not present

## 2023-10-02 MED ORDER — FLUPHENAZINE DECANOATE 25 MG/ML IJ SOLN
25.0000 mg | INTRAMUSCULAR | Status: AC
  Administered 2023-10-02 – 2024-03-13 (×9): 25 mg via INTRAMUSCULAR

## 2023-10-02 MED ORDER — FLUPHENAZINE DECANOATE 25 MG/ML IJ SOLN: 25.0000 mg | INTRAMUSCULAR | 0 refills | Status: DC

## 2023-10-02 NOTE — Telephone Encounter (Signed)
 RTC to patient no answer.  Unable to leave a message that the Clinics had called.

## 2023-10-02 NOTE — Patient Instructions (Addendum)
 Pt in office today for due Prolixin (Fluphenazine Dec) 25 mg injection. Pt affect primarily is bright. Pt c/o URI otherwise "ok". Pt has some rumination and can be tangential r/t religion, which is pt's baseline. Denies AVH, si or hi. BP elevated, 186/104 initially, as pt has not been taking his antihypertensive as it "makes me dizzy".  Risk for CVA, cardiac event, reviewed with pt. Josiah was encouraged to make his PCP aware medication is causing dizziness. He says he has an appointment upcoming this month. Pt encouraged to try to take med until he sees PCP or to call that provider's office. Pt did not respond. Injection prepared as ordered and administered in LD without complaints. Pt to return in 2 weeks for next due injection.

## 2023-10-03 NOTE — Telephone Encounter (Signed)
 Pt stated he's feeling better.Stated he went to [facility] (08/11/23 per chart); I asked about recently. Stated he has not taken OTC; stated he likes natural things. Stated he's trying to rest. Then started quoting scripture from the bible. Stated he's feeling"pretty good right now". Stated he does not need an appt right now; advised pt to call back if he needs to be seen, stated he will.

## 2023-10-04 NOTE — Telephone Encounter (Signed)
Thank you glenda ?

## 2023-10-14 NOTE — Progress Notes (Signed)
 Internal Medicine Attending:  I reviewed the AWV findings of the medical professional who conducted the visit. I was present in the office suite and immediately available to provide assistance and direction throughout the time the service was provided.

## 2023-10-16 ENCOUNTER — Ambulatory Visit (HOSPITAL_BASED_OUTPATIENT_CLINIC_OR_DEPARTMENT_OTHER)

## 2023-10-16 ENCOUNTER — Other Ambulatory Visit: Payer: Self-pay

## 2023-10-16 VITALS — BP 163/85 | HR 88 | Ht 72.5 in | Wt 213.0 lb

## 2023-10-16 DIAGNOSIS — F200 Paranoid schizophrenia: Secondary | ICD-10-CM

## 2023-10-16 NOTE — Progress Notes (Signed)
 Patient arrives today late for his due injection of Fluphenazine 25mg /mL. Patient said he was late due to some issues he had this morning. Patient was a little disheveled today, he did not have a shirt on under his jacket. Patient states that he is headed to his wife's house. He is very fixated on God and the bible today. Patient denies any SI/HI or ASH. Injection was prepared as ordered and administered in patients RD. Patient tolerated well and without complaint and will return next week for his appointment with Dr. Arfeen.    NDC: [id number] LOT: [id number]  EXP: 2026/06

## 2023-10-25 ENCOUNTER — Ambulatory Visit (HOSPITAL_BASED_OUTPATIENT_CLINIC_OR_DEPARTMENT_OTHER): Payer: 59 | Admitting: Psychiatry

## 2023-10-25 ENCOUNTER — Encounter (HOSPITAL_COMMUNITY): Payer: Self-pay | Admitting: Psychiatry

## 2023-10-25 ENCOUNTER — Other Ambulatory Visit: Payer: Self-pay

## 2023-10-25 DIAGNOSIS — G2401 Drug induced subacute dyskinesia: Secondary | ICD-10-CM | POA: Diagnosis not present

## 2023-10-25 DIAGNOSIS — F200 Paranoid schizophrenia: Secondary | ICD-10-CM | POA: Diagnosis not present

## 2023-10-25 MED ORDER — FLUPHENAZINE DECANOATE 25 MG/ML IJ SOLN: 25.0000 mg | INTRAMUSCULAR | 0 refills | Status: DC

## 2023-10-25 NOTE — Progress Notes (Signed)
 BH MD/PA/NP OP Progress Note  Patient location: Office Provider Location: Office  10/25/2023 10:30 AM Wayne Schmidt  MRN:  [id number]  Chief Complaint:  Chief Complaint  Patient presents with   Follow-up   Medication Refill   HPI: Patient came to office today for his follow-up appointment.  He is calm today and reported sleep is okay.  He does not want to talk about his paranoia but reported getting injection regularly.  He tried to visit his wife who is in rehab for more than 2 years.  Usually go once a week but lately his car is giving issues and he is hoping to resume once he get his car back.  Patient remained religiously preoccupied but denies any hallucination.  Sometime he admitted God gives him encouragement and he healing power but he also feel he need to get injection to help his symptoms.  He denies any suicidal thoughts or homicidal thoughts.  He reported lately mood is okay and is not getting upset.  Patient told his daughter was living with him but he decided to let her go because she was drinking and he does not like alcohol or drinking in his house.  Patient was admitted/seen in the emergency room for breathing problem but now he is doing better.  His appetite is fair.  He has mild tremors but no major concern and he able to function.  He denies illegal substance use.  He is getting injection Prolixin  regularly.  Visit Diagnosis:    ICD-10-CM   1. Paranoid schizophrenia, subchronic condition (HCC)  F20.0 fluPHENAZine  decanoate (PROLIXIN ) 25 MG/ML injection    2. Tardive dyskinesia  G24.01 fluPHENAZine  decanoate (PROLIXIN ) 25 MG/ML injection      Past Psychiatric History: Reviewed H/O schizophrenia with multiple inpatient treatment. H/O of hallucination, paranoia and suicidal attempt by overdose.  Last inpatient at [facility]. Took Haldol and Thorazine but had side effects.  On Prolixin  injection for many years.  Tried hydroxyzine  and Ingreeza but could not tolerate.   Past Medical  History:  Past Medical History:  Diagnosis Date   Anginal pain (HCC)    Blood creatinine increased compared with prior measurement 07/29/2019   Diabetes mellitus without complication (HCC)    Enlarged prostate    High risk sexual behavior 10/2007   Hypertension    Microcytosis    Schizophrenia (HCC)    Substance abuse (HCC)    tobacco use   UTI (urinary tract infection) 08/24/2016   Admitted 08/23/16-08/26/16 with several day history of UTI symptoms + positive UA. Culture resulted pan-sensitive E.coli. Was treated with IV Ceftriaxone  and d/c home with Bactrim  to complete 10 day course.     Past Surgical History:  Procedure Laterality Date   ARTHROSCOPY WITH ANTERIOR CRUCIATE LIGAMENT (ACL) REPAIR WITH ANTERIOR TIBILIAS GRAFT Right    CIRCUMCISION     LAMINECTOMY     by Dr Ralston Burkes   PROSTATE SURGERY      Family Psychiatric History: Reviewed  Family History:  Family History  Problem Relation Age of Onset   Diabetes Mother    Hypertension Mother    Lupus Sister    Sickle cell anemia Brother    Drug abuse Brother     Social History:  Social History   Socioeconomic History   Marital status: Legally Separated    Spouse name: Not on file   Number of children: Not on file   Years of education: Not on file   Highest education level: Not on file  Occupational  History   Not on file  Tobacco Use   Smoking status: Every Day    Current packs/day: 1.00    Average packs/day: 1 pack/day for 38.0 years (38.0 ttl pk-yrs)    Types: Cigarettes   Smokeless tobacco: Never  Vaping Use   Vaping status: Never Used  Substance and Sexual Activity   Alcohol use: Yes    Comment: Seldom.   Drug use: No    Comment: h/o remote cocaine use-denies present use   Sexual activity: Not Currently  Other Topics Concern   Not on file  Social History Narrative   Patient given diabetes card 07/07/2010   Social Drivers of Health   Financial Resource Strain: Low Risk  (10/01/2023)   Overall  Financial Resource Strain (CARDIA)    Difficulty of Paying Living Expenses: Not hard at all  Food Insecurity: No Food Insecurity (10/01/2023)   Hunger Vital Sign    Worried About Running Out of Food in the Last Year: Never true    Ran Out of Food in the Last Year: Never true  Transportation Needs: No Transportation Needs (10/01/2023)   PRAPARE - Administrator, Civil Service (Medical): No    Lack of Transportation (Non-Medical): No  Physical Activity: Insufficiently Active (10/01/2023)   Exercise Vital Sign    Days of Exercise per Week: 4 days    Minutes of Exercise per Session: 20 min  Stress: No Stress Concern Present (10/01/2023)   [employer] of Occupational Health - Occupational Stress Questionnaire    Feeling of Stress : Not at all  Social Connections: Moderately Integrated (10/01/2023)   Social Connection and Isolation Panel [NHANES]    Frequency of Communication with Friends and Family: More than three times a week    Frequency of Social Gatherings with Friends and Family: More than three times a week    Attends Religious Services: More than 4 times per year    Active Member of [employer] or Organizations: Yes    Attends Banker Meetings: More than 4 times per year    Marital Status: Separated    Allergies:  Allergies  Allergen Reactions   Ace Inhibitors Cough   Atorvastatin Other (See Comments)    Dizzy / Faint   Chlorpromazine Hcl Other (See Comments)    Passed out    Metformin  And Related Diarrhea    Dizziness    Simvastatin Other (See Comments)    Dizzy / Faint    Metabolic Disorder Labs: Lab Results  Component Value Date   HGBA1C 7.4 (A) 09/20/2023   MPG 163 08/24/2016   No results found for: "PROLACTIN" Lab Results  Component Value Date   CHOL 154 03/15/2023   TRIG 49 03/15/2023   HDL 74 03/15/2023   CHOLHDL 2.1 03/15/2023   VLDL 11 04/01/2014   LDLCALC 69 03/15/2023   LDLCALC 101 (H) 09/20/2021   Lab Results  Component  Value Date   TSH 1.470 12/12/2016   TSH 1.647 10/29/2008    Therapeutic Level Labs: No results found for: "LITHIUM" No results found for: "VALPROATE" No results found for: "CBMZ"  Current Medications: Current Outpatient Medications  Medication Sig Dispense Refill   albuterol  (VENTOLIN  HFA) 108 (90 Base) MCG/ACT inhaler Inhale 1-2 puffs into the lungs every 6 (six) hours as needed for wheezing or shortness of breath. 8 g 0   amLODipine -olmesartan  (AZOR ) 5-40 MG tablet Take 1 tablet by mouth daily. 90 tablet 1   empagliflozin  (JARDIANCE ) 10 MG TABS tablet  Take 1 tablet (10 mg total) by mouth daily before breakfast. 30 tablet 11   fluPHENAZine  decanoate (PROLIXIN ) 25 MG/ML injection Inject 1 mL (25 mg total) into the muscle every 14 (fourteen) days. 5 mL 0   fluticasone  (FLONASE ) 50 MCG/ACT nasal spray Place 1 spray into both nostrils daily. 16 mL 2   rosuvastatin  (CRESTOR ) 20 MG tablet TAKE 1 TABLET BY MOUTH ONCE DAILY 90 tablet 3   Current Facility-Administered Medications  Medication Dose Route Frequency Provider Last Rate Last Admin   fluPHENAZine  decanoate (PROLIXIN ) injection 25 mg  25 mg Intramuscular Q14 Days Liset Mcmonigle, Bronson Canny, MD   25 mg at 10/16/23 1411     Musculoskeletal: Strength & Muscle Tone: within normal limits Gait & Station: normal Patient leans: N/A  Psychiatric Specialty Exam: Review of Systems  Blood pressure (!) 166/99, pulse 77, height 6' 0.5" (1.842 m), weight 215 lb (97.5 kg).There is no height or weight on file to calculate BMI.  General Appearance: Casual  Eye Contact:  Fair  Speech:  Slow  Volume:  Decreased  Mood:  Euthymic  Affect:  Congruent  Thought Process:  Descriptions of Associations: Circumstantial  Orientation:  Full (Time, Place, and Person)  Thought Content: Delusions, Paranoid Ideation, and religiously preoccupied    Suicidal Thoughts:  No  Homicidal Thoughts:  No  Memory:  Immediate;   Fair Recent;   Fair Remote;   Fair  Judgement:   Fair  Insight:  Shallow  Psychomotor Activity:  Tremor  Concentration:  Concentration: Fair and Attention Span: Fair  Recall:  [employer] of Knowledge: Fair  Language: Fair  Akathisia:  No  Handed:  Right  AIMS (if indicated): not done  Assets:  Communication Skills Desire for Improvement Housing Transportation  ADL's:  Intact  Cognition: WNL  Sleep:  Good   Screenings: PHQ2-9    Flowsheet Row Clinical Support from 10/01/2023 in [facility] - A Dept Of [place]. [facility] Office Visit from 09/20/2023 in [facility] - A Dept Of [place]. [facility] Office Visit from 03/15/2023 in [facility] - A Dept Of [place]. [facility] Office Visit from 08/24/2022 in [facility] - A Dept Of [place]. [facility] Office Visit from 05/02/2022 in [facility] - A Dept Of [place]. [facility]  PHQ-2 Total Score 0 0 0 0 0      Flowsheet Row ED from 08/11/2023 in [facility] at [facility] ED from 05/06/2023 in [facility] at [facility] Admission (Discharged) from 04/13/2023 in [place] LONG 4TH FLOOR PROGRESSIVE CARE AND UROLOGY  C-SSRS RISK CATEGORY No Risk No Risk No Risk        Assessment and Plan: Patient is getting his Prolixin  injection 25 mg intramuscular even though sometime he has some resistance but realize it is important to continue.  He has residual paranoia and he recently preoccupied but denies any aggression, violence, suicidal thoughts.  I reviewed blood work results.  Hemoglobin A1c 7.4 slightly increased from past.  He admitted not watching his calorie intake specially carbohydrate but hoping to start a better healthy plan soon.  Patient like to have a follow-up in 3 months in person.  Continue current medication.  Patient is not interested in the treatment of tardive dyskinesia.  His symptoms are stable.   Recommend to call us  back if  is any question or any concern.  Follow-up in 3 months.  Collaboration of Care: Collaboration of Care: Other provider involved in patient's care AEB notes are available in epic to review  Patient/Guardian was advised Release of Information must be obtained prior to any record release in order to collaborate their care with an outside provider. Patient/Guardian was advised if they have not already done so to contact the registration department to sign all necessary forms in order for us  to release information regarding their care.   Consent: Patient/Guardian gives verbal consent for treatment and assignment of benefits for services provided during this visit. Patient/Guardian expressed understanding and agreed to proceed.   I provided 21 minutes face-to-face time during this encounter.  Arturo Late, MD 10/25/2023, 10:30 AM

## 2023-10-30 ENCOUNTER — Telehealth (HOSPITAL_COMMUNITY): Payer: Self-pay

## 2023-10-30 ENCOUNTER — Ambulatory Visit (HOSPITAL_COMMUNITY)

## 2023-10-30 NOTE — Telephone Encounter (Signed)
 Thanks for the update.  He did not show up on Thursday then we may have to call the oral Prolixin .  However I rather have him on injection due to history of noncompliance.

## 2023-10-30 NOTE — Telephone Encounter (Signed)
 I called patient, he was scheduled to get his shot yesterday and rescheduled to today, he did not show up today and so I called him. Patient seems distracted, he apologized for not coming and said he felt overwhelmed. His car is in the shop and you have to call 3 days in advance for transportation. When we ended the call I asked him to please call transport right away and then call us  back to let us  know when he will be here. Patient is currently saying he will come in next Tuesday.

## 2023-11-06 ENCOUNTER — Ambulatory Visit (HOSPITAL_BASED_OUTPATIENT_CLINIC_OR_DEPARTMENT_OTHER)

## 2023-11-06 VITALS — BP 197/96 | Temp 86.0°F | Ht 72.5 in | Wt 216.0 lb

## 2023-11-06 DIAGNOSIS — F200 Paranoid schizophrenia: Secondary | ICD-10-CM | POA: Diagnosis not present

## 2023-11-06 NOTE — Progress Notes (Signed)
 Patient arrives today for his overdue injection of Fluphenazine  25 mg/mL. Patient presents well groomed with an appropriate affect. Patient seems slightly paranoid today, he is still very focused on religion through our conversation. Patient denies SI/HI or AVH. Injection is prepared as ordered and administered in patients Left Deltoid. Patient tolerated well and without complaint. He will return in 2 weeks for his next injection    NDC: 4098-1191-47 LOT: [id number] EXP: 2026/06

## 2023-11-08 ENCOUNTER — Other Ambulatory Visit: Payer: Self-pay

## 2023-11-08 MED ORDER — AMLODIPINE-OLMESARTAN 5-40 MG PO TABS: 1.0000 | ORAL_TABLET | Freq: Every day | ORAL | 1 refills | Status: DC

## 2023-11-20 ENCOUNTER — Ambulatory Visit (HOSPITAL_COMMUNITY)

## 2023-11-21 ENCOUNTER — Emergency Department (HOSPITAL_COMMUNITY)
Admission: EM | Admit: 2023-11-21 | Discharge: 2023-11-21 | Disposition: A | Discharge status: Home / Self Care | Attending: Emergency Medicine | Admitting: Emergency Medicine

## 2023-11-21 ENCOUNTER — Other Ambulatory Visit: Payer: Self-pay

## 2023-11-21 ENCOUNTER — Encounter (HOSPITAL_COMMUNITY): Payer: Self-pay

## 2023-11-21 ENCOUNTER — Emergency Department (HOSPITAL_COMMUNITY)

## 2023-11-21 DIAGNOSIS — Z79899 Other long term (current) drug therapy: Secondary | ICD-10-CM | POA: Insufficient documentation

## 2023-11-21 DIAGNOSIS — M47816 Spondylosis without myelopathy or radiculopathy, lumbar region: Secondary | ICD-10-CM | POA: Diagnosis not present

## 2023-11-21 DIAGNOSIS — M4807 Spinal stenosis, lumbosacral region: Secondary | ICD-10-CM | POA: Diagnosis not present

## 2023-11-21 DIAGNOSIS — M5442 Lumbago with sciatica, left side: Secondary | ICD-10-CM | POA: Insufficient documentation

## 2023-11-21 DIAGNOSIS — G8929 Other chronic pain: Secondary | ICD-10-CM | POA: Diagnosis not present

## 2023-11-21 DIAGNOSIS — M48061 Spinal stenosis, lumbar region without neurogenic claudication: Secondary | ICD-10-CM | POA: Diagnosis not present

## 2023-11-21 DIAGNOSIS — Z743 Need for continuous supervision: Secondary | ICD-10-CM | POA: Diagnosis not present

## 2023-11-21 DIAGNOSIS — M549 Dorsalgia, unspecified: Secondary | ICD-10-CM | POA: Diagnosis not present

## 2023-11-21 DIAGNOSIS — M5136 Other intervertebral disc degeneration, lumbar region with discogenic back pain only: Secondary | ICD-10-CM | POA: Diagnosis not present

## 2023-11-21 MED ORDER — OXYCODONE-ACETAMINOPHEN 5-325 MG PO TABS: 1.0000 | ORAL_TABLET | Freq: Four times a day (QID) | ORAL | 0 refills | Status: AC | PRN

## 2023-11-21 MED ORDER — OXYCODONE-ACETAMINOPHEN 5-325 MG PO TABS
1.0000 | ORAL_TABLET | Freq: Once | ORAL | Status: AC
  Administered 2023-11-21: 1 via ORAL
  Filled 2023-11-21: qty 1
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
## 2023-11-21 NOTE — Discharge Instructions (Addendum)
 As discussed, your imaging of your low back shows degenerative changes.  This could be causing you to have a pinched nerve or sciatic nerve pain which would explain the pain in the left thigh as well.  Follow-up with orthopedics for further evaluation of your symptoms.  Information provided above.  Alternate between ibuprofen  600 mg and Tylenol  6 and 50 mg every 4 hours for pain.  I have sent a prescription of Percocet to your pharmacy.  You can take this every 6 hours as needed for breakthrough pain.  This medication can cause drowsiness, do not drive or operate heavy machinery while taking it.  Get help right away if: You are not able to control when you pee or poop. You have bad back pain and: You feel like you may vomit (nauseous). You vomit. You have pain in your chest or your belly (abdomen). You have shortness of breath. You faint.

## 2023-11-21 NOTE — ED Provider Triage Note (Signed)
 Emergency Medicine Provider Triage Evaluation Note  Wayne Schmidt , a 68 y.o. male  was evaluated in triage.  Pt complains of back pain.  Patient endorses gradually worsening low back pain for the past several months.  Pain sometimes radiates down the left leg.   Review of Systems  Positive: Back and leg pain Negative: Numbness, tingling, weakness  Physical Exam  BP 134/77   Pulse 96   Temp 98.6 F (37 C) (Oral)   Resp 18   Ht 6' (1.829 m)   Wt 98 kg   SpO2 99%   BMI 29.30 kg/m  Gen:   Awake, no distress   Resp:  Normal effort  MSK:   Moves extremities without difficulty  Other:    Medical Decision Making  Medically screening exam initiated at 12:51 PM.  Appropriate orders placed.  Wayne Schmidt was informed that the remainder of the evaluation will be completed by another provider, this initial triage assessment does not replace that evaluation, and the importance of remaining in the ED until their evaluation is complete.   Sonnie Dusky, PA-C 11/21/23 1253

## 2023-11-21 NOTE — ED Triage Notes (Signed)
 Pt arrived reporting right side back pn, chronic.Wayne Schmidt Denies numbness/tingling or any changes to bowel/bladder. No recent inj. Pain radiates down R leg.

## 2023-12-10 ENCOUNTER — Telehealth (HOSPITAL_COMMUNITY): Payer: Self-pay | Admitting: *Deleted

## 2023-12-10 ENCOUNTER — Telehealth: Payer: Self-pay | Admitting: *Deleted

## 2023-12-10 NOTE — Telephone Encounter (Unsigned)
 Copied from CRM [id number]. Topic: General - Other >> Dec 10, 2023  8:22 AM Wayne Schmidt H wrote: Reason for CRM: Patient was calling because he wants the provider to call him back, he states that its a semi urgent matter, patient states its not a er matter and doesn't want to speak to a nurse just his provider, patient is wanting to discuss the issue with the provider, callback number is [phone].
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
## 2023-12-10 NOTE — Telephone Encounter (Signed)
 LVM for pt regarding making appointment for overdue LAI.

## 2023-12-12 ENCOUNTER — Telehealth (HOSPITAL_COMMUNITY): Payer: Self-pay

## 2023-12-12 ENCOUNTER — Telehealth (HOSPITAL_COMMUNITY): Payer: Self-pay | Admitting: *Deleted

## 2023-12-12 NOTE — Telephone Encounter (Signed)
 Patient has not had his injection since 11/06/2023. Both Wayne Schmidt and I have reached out and left messages. Patient is not answering and not returning calls

## 2023-12-12 NOTE — Telephone Encounter (Signed)
 Writer spoke with pt daughter, Holland Lundborg, who is emergency contact. Writer advised that pt has not been in for his LAI in quite some time. Holland Lundborg says she was not aware of this but would call him to advise and that she can bring him in tomorrow for injection as he has another doctor tomorrow as well.

## 2023-12-13 ENCOUNTER — Ambulatory Visit (HOSPITAL_BASED_OUTPATIENT_CLINIC_OR_DEPARTMENT_OTHER)

## 2023-12-13 ENCOUNTER — Other Ambulatory Visit: Payer: Self-pay

## 2023-12-13 ENCOUNTER — Ambulatory Visit: Admitting: Student

## 2023-12-13 ENCOUNTER — Encounter (HOSPITAL_COMMUNITY): Payer: Self-pay

## 2023-12-13 ENCOUNTER — Encounter: Payer: Self-pay | Admitting: Student

## 2023-12-13 VITALS — BP 154/78 | HR 96 | Ht 72.5 in | Wt 202.0 lb

## 2023-12-13 VITALS — BP 129/81 | HR 87 | Temp 98.0°F | Ht 72.0 in | Wt 202.2 lb

## 2023-12-13 DIAGNOSIS — M545 Low back pain, unspecified: Secondary | ICD-10-CM | POA: Diagnosis not present

## 2023-12-13 DIAGNOSIS — I10 Essential (primary) hypertension: Secondary | ICD-10-CM | POA: Diagnosis not present

## 2023-12-13 DIAGNOSIS — F200 Paranoid schizophrenia: Secondary | ICD-10-CM | POA: Diagnosis not present

## 2023-12-13 DIAGNOSIS — M549 Dorsalgia, unspecified: Secondary | ICD-10-CM | POA: Insufficient documentation

## 2023-12-13 DIAGNOSIS — G8929 Other chronic pain: Secondary | ICD-10-CM | POA: Diagnosis not present

## 2023-12-13 MED ORDER — NAPROXEN 500 MG PO TABS: 500.0000 mg | ORAL_TABLET | Freq: Two times a day (BID) | ORAL | 0 refills | Status: DC

## 2023-12-13 NOTE — Progress Notes (Signed)
 Patient arrived today for his overdue injection of Fluphenazine  Dec 25mg /79mL. Patient presents slightly disheveled and unshaven. Patient states that he has not been coming for his shot because he was unable to walk due to back pain. Patient came with his grandson and he said he would be keeping up with his grandfathers appointments. Patient denies any HI/SI or AVH. The injection was prepared as ordered and administered in patients Left Deltoid. Patient tolerated well and without complaint and agreed he will be back in two weeks for his next due injection.    NDC: 91478-295-62 LOT: [id number] EXP: 12/2024
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
## 2023-12-13 NOTE — Assessment & Plan Note (Addendum)
 BP Readings from Last 3 Encounters:  12/13/23 129/81  11/21/23 134/77  11/06/23 (!) 197/96   Blood pressure was initially elevated, we can general repeat.  He is prescribed amlodipine -olmesartan  5-40 mg daily, but has not been taking it regularly.  At any rate, his blood pressure is well-controlled today, so will not make any changes.

## 2023-12-13 NOTE — Assessment & Plan Note (Addendum)
 He is on chronic long-acting injectable antipsychotic medication, however he is is now overdue for this medication.  Per nursing note, his daughter says she is taking him today to get this medication administered.  I think this is a good idea.  His thought processes and speech are tangential and he perseverates on ideas with religious symbolism.
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
## 2023-12-13 NOTE — Patient Instructions (Signed)
 Thank you, Mr.Domani Linck for allowing us  to provide your care today. Today we discussed your back pain.  As we discussed, I suspect your back pain is related to muscle and nerve inflammation in the lower back due to injury. I have sent in a prescription for Naproxen, which you can take up to 2 times daily as needed for the back pain.   I have also placed a referral to Physical Therapy, who will call you to schedule an appointment.     Referrals ordered today:    Referral Orders         Ambulatory referral to Physical Therapy      I have ordered the following medication/changed the following medications:    Start the following medications: Meds ordered this encounter  Medications   naproxen (NAPROSYN) 500 MG tablet    Sig: Take 1 tablet (500 mg total) by mouth 2 (two) times daily with a meal for 10 days.    Dispense:  20 tablet    Refill:  0     Follow up: 2-3 months   We look forward to seeing you next time. Please call our clinic at [phone] if you have any questions or concerns. The best time to call is Monday-Friday from 9am-4pm, but there is someone available 24/7. If after hours or the weekend, call the main hospital number and ask for the Internal Medicine Resident On-Call. If you need medication refills, please notify your pharmacy one week in advance and they will send us  a request.   Thank you for trusting me with your care. Wishing you the best!   Dorthy Gavia, MD [facility]

## 2023-12-20 ENCOUNTER — Ambulatory Visit (INDEPENDENT_AMBULATORY_CARE_PROVIDER_SITE_OTHER): Admitting: Internal Medicine

## 2023-12-20 VITALS — BP 165/91 | HR 86 | Temp 98.1°F | Ht 72.5 in | Wt 206.8 lb

## 2023-12-20 DIAGNOSIS — E785 Hyperlipidemia, unspecified: Secondary | ICD-10-CM | POA: Diagnosis not present

## 2023-12-20 DIAGNOSIS — E1169 Type 2 diabetes mellitus with other specified complication: Secondary | ICD-10-CM | POA: Diagnosis not present

## 2023-12-20 DIAGNOSIS — M5416 Radiculopathy, lumbar region: Secondary | ICD-10-CM | POA: Diagnosis not present

## 2023-12-20 DIAGNOSIS — I10 Essential (primary) hypertension: Secondary | ICD-10-CM | POA: Diagnosis not present

## 2023-12-20 DIAGNOSIS — E782 Mixed hyperlipidemia: Secondary | ICD-10-CM

## 2023-12-20 LAB — POCT GLYCOSYLATED HEMOGLOBIN (HGB A1C): Hemoglobin A1C: 7.7 % — AB (ref 4.0–5.6)

## 2023-12-20 LAB — GLUCOSE, CAPILLARY: Glucose-Capillary: 131 mg/dL — ABNORMAL HIGH (ref 70–99)

## 2023-12-20 MED ORDER — NAPROXEN 500 MG PO TABS: 500.0000 mg | ORAL_TABLET | Freq: Two times a day (BID) | ORAL | 1 refills | Status: DC | PRN

## 2023-12-21 NOTE — Assessment & Plan Note (Signed)
 A1c up to 7.7 discussed with him he needs to make dietary changes or we will need to escalate his medication therapy.  He is agreeable to making those dietary changes.

## 2023-12-21 NOTE — Assessment & Plan Note (Signed)
 Blood pressure elevated today he has good refill history and reports adherence to amlodipine  olmesartan .  This may be in the setting of his NSAID use.  Previous visits have had excellent blood pressure control I do not think I will change his current medications I have asked him to call me if his blood pressure does not return to less than 140/90.

## 2023-12-21 NOTE — Progress Notes (Signed)
 Established Patient Office Visit  Subjective   Patient ID: Wayne Schmidt, male    DOB: Nov 29, 1955  Age: 68 y.o. MRN: [id number]  Chief Complaint  Patient presents with   Back Pain   Lawrance is here today to follow-up on his chronic diseases including diabetes and back pain.  He was seen about a week and a half ago here started on naproxen  and he notes he is slowly getting better.  He has not been able to move as quickly as he was previously due to concern he will cause an increased amount of back pain but he feels that the naproxen  has helped significantly. For his history of paranoid schizophrenia he did go and get his fluphenazine  injection his speech is a little less pressured but still focus on religion.  He is not currently hearing any voices. For his diabetes he has been taking Jardiance  he thinks he is tolerating it well he would rather take no medications.  He does admit to occasionally eating things that he knows he should not and reports that he will try to do better.     Objective:     BP (!) 165/91   Pulse 86   Temp 98.1 F (36.7 C) (Oral)   Ht 6' 0.5 (1.842 m)   Wt 206 lb 12.8 oz (93.8 kg)   SpO2 99%   BMI 27.66 kg/m  BP Readings from Last 3 Encounters:  12/20/23 (!) 165/91  12/13/23 129/81  11/21/23 134/77   Wt Readings from Last 3 Encounters:  12/20/23 206 lb 12.8 oz (93.8 kg)  12/13/23 202 lb 3.2 oz (91.7 kg)  11/21/23 216 lb 0.8 oz (98 kg)      Physical Exam Vitals and nursing note reviewed.  Constitutional:      General: He is not in acute distress.    Appearance: Normal appearance.   Cardiovascular:     Rate and Rhythm: Normal rate and regular rhythm.  Pulmonary:     Effort: Pulmonary effort is normal.     Breath sounds: Normal breath sounds.   Musculoskeletal:     Right lower leg: No edema.     Left lower leg: No edema.   Neurological:     Mental Status: He is alert.   Psychiatric:        Mood and Affect: Mood normal.       Results for orders placed or performed in visit on 12/20/23  Glucose, capillary  Result Value Ref Range   Glucose-Capillary 131 (H) 70 - 99 mg/dL  POC Hbg J1B  Result Value Ref Range   Hemoglobin A1C 7.7 (A) 4.0 - 5.6 %   HbA1c POC (<> result, manual entry)     HbA1c, POC (prediabetic range)     HbA1c, POC (controlled diabetic range)      Last metabolic panel Lab Results  Component Value Date   GLUCOSE 102 (H) 03/15/2023   NA 142 03/15/2023   K 4.6 03/15/2023   CL 103 03/15/2023   CO2 23 03/15/2023   BUN 13 03/15/2023   CREATININE 1.16 03/15/2023   EGFR 69 03/15/2023   CALCIUM  9.4 03/15/2023   PROT 7.1 12/01/2022   ALBUMIN 4.1 12/01/2022   LABGLOB 2.4 09/20/2021   AGRATIO 1.9 09/20/2021   BILITOT 0.6 12/01/2022   ALKPHOS 67 12/01/2022   AST 28 12/01/2022   ALT 21 12/01/2022   ANIONGAP 10 12/01/2022      The 10-year ASCVD risk score (Arnett DK, et al., 2019) is:  59.1%    Assessment & Plan:   Problem List Items Addressed This Visit       Cardiovascular and Mediastinum   Essential hypertension (Chronic)   Blood pressure elevated today he has good refill history and reports adherence to amlodipine  olmesartan .  This may be in the setting of his NSAID use.  Previous visits have had excellent blood pressure control I do not think I will change his current medications I have asked him to call me if his blood pressure does not return to less than 140/90.        Endocrine   Type 2 diabetes mellitus with other specified complication (HCC) - Primary   A1c up to 7.7 discussed with him he needs to make dietary changes or we will need to escalate his medication therapy.  He is agreeable to making those dietary changes.      Relevant Orders   POC Hbg A1C (Completed)     Nervous and Auditory   Lumbar radiculopathy   Pain is improving with naproxen  he request to extend the course a little bit longer which I think is reasonable.        Other   Hyperlipidemia  (Chronic)   Continue Crestor  20 daily.       Return in about 3 months (around 03/21/2024) for DM.    Priscella Brooms, DO

## 2023-12-21 NOTE — Assessment & Plan Note (Signed)
 Pain is improving with naproxen  he request to extend the course a little bit longer which I think is reasonable.

## 2023-12-21 NOTE — Progress Notes (Signed)
Internal Medicine Clinic Attending  I was physically present during the key portions of the resident provided service and participated in the medical decision making of patient's management care. I reviewed pertinent patient test results.  The assessment, diagnosis, and plan were formulated together and I agree with the documentation in the resident's note.  Gust Rung, DO

## 2023-12-21 NOTE — Assessment & Plan Note (Signed)
Continue Crestor 20 daily

## 2023-12-27 ENCOUNTER — Ambulatory Visit (HOSPITAL_COMMUNITY): Admitting: *Deleted

## 2023-12-27 ENCOUNTER — Other Ambulatory Visit (HOSPITAL_COMMUNITY): Payer: Self-pay

## 2023-12-27 DIAGNOSIS — F200 Paranoid schizophrenia: Secondary | ICD-10-CM

## 2023-12-27 DIAGNOSIS — G2401 Drug induced subacute dyskinesia: Secondary | ICD-10-CM

## 2024-01-07 ENCOUNTER — Ambulatory Visit (HOSPITAL_COMMUNITY): Admitting: *Deleted

## 2024-01-07 ENCOUNTER — Telehealth (HOSPITAL_COMMUNITY): Payer: Self-pay | Admitting: *Deleted

## 2024-01-07 DIAGNOSIS — F200 Paranoid schizophrenia: Secondary | ICD-10-CM

## 2024-01-07 NOTE — Patient Instructions (Signed)
 Pt presents to clinic to pick up medication that was mistakenly delivered to us  by [facility]. Pt was loud, angry, gesticulating. Pt very religiously preoccupied and was difficult to redirect. Pt did agree to take injection. Prolixin  25 mg. Injection was prepared as ordered and administered in RD without compliant. Pt was able to calm a bit but still very focused on religion. Pt says he may give his medication to someone else so they can heal in regards to his injection.

## 2024-01-07 NOTE — Telephone Encounter (Signed)
 Pt's pill packs were mistakenly delivered here this morning from [facility]. Both CMA and myself have attempted to contact pt repeatedly on both contact numbers (home and mobile) without success. Writer then reached out to [place] daughter, Odella, but had to LVM. I encouraged her to call us  back with any questions or concerns. Also advised pt is overdue for LAI.

## 2024-01-15 ENCOUNTER — Ambulatory Visit: Payer: Self-pay
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
## 2024-01-16 NOTE — Telephone Encounter (Addendum)
 Return pt's call. Stated he went to the ER (5/21) for back pain.  Then he went to a chiropractor. MRI done. Stated he might need abx. Stated he wants to discuss this with Dr Rosan-  Appt schedule 7/28.

## 2024-01-18 NOTE — Telephone Encounter (Signed)
 Thanks you!

## 2024-01-22 ENCOUNTER — Ambulatory Visit (HOSPITAL_BASED_OUTPATIENT_CLINIC_OR_DEPARTMENT_OTHER)

## 2024-01-22 VITALS — BP 187/116 | HR 67 | Ht 72.5 in | Wt 206.0 lb

## 2024-01-22 DIAGNOSIS — F200 Paranoid schizophrenia: Secondary | ICD-10-CM | POA: Diagnosis not present

## 2024-01-22 NOTE — Progress Notes (Signed)
 Patient presents today for his due injection of Prolixin  25 mg / mL. Patient presents well groomed and apologetic for his behavior last week. Patient was in a lot of pain today, he is dealing with a sciatic nerve issue. Patient is seeing a Land. Patient endorses hearing the voice of god. Patient denies any SI/HI or visual hallucinations. Injection was prepared as ordered and administered in patients LD. Patient tolerated well and without complaint and will return in 2 weeks for his next due injection.     NDC: [id number] LOT: [id number] EXP: 2026-06

## 2024-01-24 ENCOUNTER — Ambulatory Visit (HOSPITAL_COMMUNITY): Admitting: Psychiatry

## 2024-01-28 ENCOUNTER — Encounter: Admitting: Internal Medicine

## 2024-01-28 ENCOUNTER — Telehealth: Payer: Self-pay | Admitting: Internal Medicine

## 2024-01-28 NOTE — Telephone Encounter (Signed)
 Reviewed his MRI results from 01/09/24 ordered by his chiropractor that he dropped off for us .  There is multiple areas of foraminal stenosis and spinal canal stenosis that is not surprising. However there is and area of left posterior paraspinal soft tissue fluid collection measuring 7cm with surrounding soft tissue swelling and paraspinal myositis which is suspicious for an abscess recommended follow up lumbar MRI with and without contrast.    He was supposed to have a visit with me today but it was canceled.  I called him and he reports to me he has a MRI today which is why he canceled.  He denies any fever or chills. He is having some trouble walking with his back pain.  I also called his chiropractic office to touch base and discuss his treatment plan, left message with secretary and awaiting call back.

## 2024-01-30 ENCOUNTER — Ambulatory Visit: Payer: Self-pay | Admitting: Student

## 2024-01-30 ENCOUNTER — Telehealth: Payer: Self-pay | Admitting: *Deleted

## 2024-01-30 VITALS — BP 110/66 | HR 67 | Temp 98.2°F | Ht 72.5 in | Wt 208.4 lb

## 2024-01-30 DIAGNOSIS — M5416 Radiculopathy, lumbar region: Secondary | ICD-10-CM

## 2024-01-30 DIAGNOSIS — M4620 Osteomyelitis of vertebra, site unspecified: Secondary | ICD-10-CM | POA: Diagnosis not present

## 2024-01-30 DIAGNOSIS — R937 Abnormal findings on diagnostic imaging of other parts of musculoskeletal system: Secondary | ICD-10-CM

## 2024-01-30 MED ORDER — PREGABALIN 75 MG PO CAPS: 75.0000 mg | ORAL_CAPSULE | Freq: Three times a day (TID) | ORAL | 0 refills | Status: DC
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
## 2024-01-30 NOTE — Progress Notes (Unsigned)
 CC: Follow-up  HPI:  Wayne Schmidt is a 68 y.o. male living with a history stated below and presents today for follow-up. Please see problem based assessment and plan for additional details.  Past Medical History:  Diagnosis Date   Anginal pain (HCC)    Blood creatinine increased compared with prior measurement 07/29/2019   Diabetes mellitus without complication (HCC)    Enlarged prostate    High risk sexual behavior 10/2007   Hypertension    Microcytosis    Schizophrenia (HCC)    Substance abuse (HCC)    tobacco use   UTI (urinary tract infection) 08/24/2016   Admitted 08/23/16-08/26/16 with several day history of UTI symptoms + positive UA. Culture resulted pan-sensitive E.coli. Was treated with IV Ceftriaxone  and d/c home with Bactrim  to complete 10 day course.     Current Outpatient Medications on File Prior to Visit  Medication Sig Dispense Refill   albuterol  (VENTOLIN  HFA) 108 (90 Base) MCG/ACT inhaler Inhale 1-2 puffs into the lungs every 6 (six) hours as needed for wheezing or shortness of breath. 8 g 0   amLODipine -olmesartan  (AZOR ) 5-40 MG tablet Take 1 tablet by mouth daily. 90 tablet 1   empagliflozin  (JARDIANCE ) 10 MG TABS tablet Take 1 tablet (10 mg total) by mouth daily before breakfast. 30 tablet 11   fluPHENAZine  decanoate (PROLIXIN ) 25 MG/ML injection Inject 1 mL (25 mg total) into the muscle every 14 (fourteen) days. 5 mL 0   fluticasone  (FLONASE ) 50 MCG/ACT nasal spray Place 1 spray into both nostrils daily. 16 mL 2   naproxen  (NAPROSYN ) 500 MG tablet Take 1 tablet (500 mg total) by mouth 2 (two) times daily as needed for moderate pain (pain score 4-6). 20 tablet 1   rosuvastatin  (CRESTOR ) 20 MG tablet TAKE 1 TABLET BY MOUTH ONCE DAILY 90 tablet 3   Current Facility-Administered Medications on File Prior to Visit  Medication Dose Route Frequency Provider Last Rate Last Admin   fluPHENAZine  decanoate (PROLIXIN ) injection 25 mg  25 mg Intramuscular Q14 Days  Arfeen, Leni DASEN, MD   25 mg at 01/22/24 1413    Family History  Problem Relation Age of Onset   Diabetes Mother    Hypertension Mother    Lupus Sister    Sickle cell anemia Brother    Drug abuse Brother     Social History   Socioeconomic History   Marital status: Legally Separated    Spouse name: Not on file   Number of children: Not on file   Years of education: Not on file   Highest education level: Not on file  Occupational History   Not on file  Tobacco Use   Smoking status: Every Day    Current packs/day: 1.00    Average packs/day: 1 pack/day for 38.0 years (38.0 ttl pk-yrs)    Types: Cigarettes   Smokeless tobacco: Never  Vaping Use   Vaping status: Never Used  Substance and Sexual Activity   Alcohol use: Yes    Comment: Seldom.   Drug use: No    Comment: h/o remote cocaine use-denies present use   Sexual activity: Not Currently  Other Topics Concern   Not on file  Social History Narrative   Patient given diabetes card 07/07/2010   Social Drivers of Health   Financial Resource Strain: Low Risk  (10/01/2023)   Overall Financial Resource Strain (CARDIA)    Difficulty of Paying Living Expenses: Not hard at all  Food Insecurity: No Food Insecurity (10/01/2023)  Hunger Vital Sign    Worried About Running Out of Food in the Last Year: Never true    Ran Out of Food in the Last Year: Never true  Transportation Needs: No Transportation Needs (10/01/2023)   PRAPARE - Administrator, Civil Service (Medical): No    Lack of Transportation (Non-Medical): No  Physical Activity: Insufficiently Active (10/01/2023)   Exercise Vital Sign    Days of Exercise per Week: 4 days    Minutes of Exercise per Session: 20 min  Stress: No Stress Concern Present (10/01/2023)   [employer] of Occupational Health - Occupational Stress Questionnaire    Feeling of Stress : Not at all  Social Connections: Moderately Integrated (10/01/2023)   Social Connection and Isolation  Panel    Frequency of Communication with Friends and Family: More than three times a week    Frequency of Social Gatherings with Friends and Family: More than three times a week    Attends Religious Services: More than 4 times per year    Active Member of [employer] or Organizations: Yes    Attends Banker Meetings: More than 4 times per year    Marital Status: Separated  Intimate Partner Violence: Not At Risk (10/01/2023)   Humiliation, Afraid, Rape, and Kick questionnaire    Fear of Current or Ex-Partner: No    Emotionally Abused: No    Physically Abused: No    Sexually Abused: No    Review of Systems: ROS negative except for what is noted on the assessment and plan.  Vitals:   01/30/24 1415 01/30/24 1433  BP: (!) 159/82 110/66  Pulse: 85 67  Temp: 98.2 F (36.8 C)   TempSrc: Oral   SpO2: 100%   Weight: 208 lb 6.4 oz (94.5 kg)   Height: 6' 0.5 (1.842 m)     Physical Exam: Constitutional: well-appearing, sitting in chair, in no acute distress Cardiovascular: regular rate and rhythm, no m/r/g Pulmonary/Chest: normal work of breathing on room air, lungs clear to auscultation bilaterally Abdominal: soft, non-tender, non-distended MSK: normal bulk and tone Skin: warm and dry Psych: normal mood and behavior  Assessment & Plan:     Patient discussed with Dr. CHARLENA Eastern  No problem-specific Assessment & Plan notes found for this encounter.   Norman Lobstein, D.O. [facility] Internal Medicine, PGY-1 Phone: [phone] Date 01/30/2024 Time 2:42 PM

## 2024-01-31 ENCOUNTER — Other Ambulatory Visit: Payer: Self-pay | Admitting: Student

## 2024-01-31 NOTE — Telephone Encounter (Signed)
 Medication refilled yesterday.

## 2024-02-01 NOTE — Progress Notes (Signed)
 Internal Medicine Clinic Attending  Case discussed with the resident at the time of the visit.  We reviewed the resident's history and exam and pertinent patient test results.  I agree with the assessment, diagnosis, and plan of care documented in the resident's note.  Given the duration of time of his back pain, lack of systemic symptoms we will send for outpatient referral to neurosurgery.
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
## 2024-02-02 LAB — CBC
Hematocrit: 42.6 % (ref 37.5–51.0)
Hemoglobin: 13 g/dL (ref 13.0–17.7)
MCH: 23.5 pg — ABNORMAL LOW (ref 26.6–33.0)
MCHC: 30.5 g/dL — ABNORMAL LOW (ref 31.5–35.7)
MCV: 77 fL — ABNORMAL LOW (ref 79–97)
Platelets: 280 x10E3/uL (ref 150–450)
RBC: 5.54 x10E6/uL (ref 4.14–5.80)
RDW: 15.9 % — ABNORMAL HIGH (ref 11.6–15.4)
WBC: 10.2 x10E3/uL (ref 3.4–10.8)

## 2024-02-02 LAB — BASIC METABOLIC PANEL WITH GFR
BUN/Creatinine Ratio: 13 (ref 10–24)
BUN: 15 mg/dL (ref 8–27)
CO2: 19 mmol/L — ABNORMAL LOW (ref 20–29)
Calcium: 9.4 mg/dL (ref 8.6–10.2)
Chloride: 100 mmol/L (ref 96–106)
Creatinine, Ser: 1.13 mg/dL (ref 0.76–1.27)
Glucose: 100 mg/dL — ABNORMAL HIGH (ref 70–99)
Potassium: 4.8 mmol/L (ref 3.5–5.2)
Sodium: 137 mmol/L (ref 134–144)
eGFR: 70 mL/min/1.73 (ref >59)

## 2024-02-02 LAB — SEDIMENTATION RATE: Sed Rate: 81 mm/h — ABNORMAL HIGH (ref 0–30)

## 2024-02-02 LAB — C-REACTIVE PROTEIN: CRP: 45 mg/L — ABNORMAL HIGH (ref 0–10)

## 2024-02-05 ENCOUNTER — Telehealth: Payer: Self-pay | Admitting: Student

## 2024-02-05 ENCOUNTER — Ambulatory Visit (HOSPITAL_BASED_OUTPATIENT_CLINIC_OR_DEPARTMENT_OTHER)

## 2024-02-05 VITALS — BP 140/88 | HR 78 | Temp 98.4°F | Ht 72.5 in | Wt 208.0 lb

## 2024-02-05 DIAGNOSIS — F200 Paranoid schizophrenia: Secondary | ICD-10-CM

## 2024-02-05 NOTE — Telephone Encounter (Signed)
 Patient was contacted via telephone per the request of Dr. Norman Lobstein to come into the office to sign ROI.  Was able to speak with patient he will try to make it in today before we close to sign the release.  He was made aware of our closing hours for lunch and end of day.

## 2024-02-05 NOTE — Progress Notes (Unsigned)
 Patient arrives today for his due injection of Prolixin  25mg /mL. Patient presents well groomed and with a good affect. Patient denies any SI/HI or AVH. Injection was prepared and administered in patients RD. Patient tolerated well and without complaint. He will return in 2 weeks for his next injection   NDC: 9856-0470-98 LOT: [id number] EXP: 2026/06

## 2024-02-06 ENCOUNTER — Ambulatory Visit: Payer: Self-pay | Admitting: Student

## 2024-02-07 ENCOUNTER — Emergency Department (HOSPITAL_COMMUNITY)

## 2024-02-07 ENCOUNTER — Emergency Department (HOSPITAL_COMMUNITY)
Admission: EM | Admit: 2024-02-07 | Discharge: 2024-02-08 | Disposition: A | Discharge status: Home / Self Care | Attending: Emergency Medicine | Admitting: Emergency Medicine

## 2024-02-07 ENCOUNTER — Encounter (HOSPITAL_COMMUNITY): Payer: Self-pay

## 2024-02-07 DIAGNOSIS — M5416 Radiculopathy, lumbar region: Secondary | ICD-10-CM | POA: Insufficient documentation

## 2024-02-07 DIAGNOSIS — M545 Low back pain, unspecified: Secondary | ICD-10-CM | POA: Diagnosis present

## 2024-02-07 DIAGNOSIS — M5410 Radiculopathy, site unspecified: Secondary | ICD-10-CM

## 2024-02-07 DIAGNOSIS — M4802 Spinal stenosis, cervical region: Secondary | ICD-10-CM | POA: Diagnosis not present

## 2024-02-07 DIAGNOSIS — M60009 Infective myositis, unspecified site: Secondary | ICD-10-CM

## 2024-02-07 LAB — CBC WITH DIFFERENTIAL/PLATELET
Abs Immature Granulocytes: 0.03 K/uL (ref 0.00–0.07)
Basophils Absolute: 0.1 K/uL (ref 0.0–0.1)
Basophils Relative: 1 %
Eosinophils Absolute: 0.3 K/uL (ref 0.0–0.5)
Eosinophils Relative: 3 %
HCT: 42.8 % (ref 39.0–52.0)
Hemoglobin: 12.9 g/dL — ABNORMAL LOW (ref 13.0–17.0)
Immature Granulocytes: 0 %
Lymphocytes Relative: 47 %
Lymphs Abs: 4.8 K/uL — ABNORMAL HIGH (ref 0.7–4.0)
MCH: 22.8 pg — ABNORMAL LOW (ref 26.0–34.0)
MCHC: 30.1 g/dL (ref 30.0–36.0)
MCV: 75.8 fL — ABNORMAL LOW (ref 80.0–100.0)
Monocytes Absolute: 0.8 K/uL (ref 0.1–1.0)
Monocytes Relative: 8 %
Neutro Abs: 4.2 K/uL (ref 1.7–7.7)
Neutrophils Relative %: 41 %
Platelets: 417 K/uL — ABNORMAL HIGH (ref 150–400)
RBC: 5.65 MIL/uL (ref 4.22–5.81)
RDW: 16.9 % — ABNORMAL HIGH (ref 11.5–15.5)
WBC: 10.2 K/uL (ref 4.0–10.5)
nRBC: 0 % (ref 0.0–0.2)

## 2024-02-07 LAB — BASIC METABOLIC PANEL WITH GFR
Anion gap: 9 (ref 5–15)
BUN: 11 mg/dL (ref 8–23)
CO2: 27 mmol/L (ref 22–32)
Calcium: 9.1 mg/dL (ref 8.9–10.3)
Chloride: 105 mmol/L (ref 98–111)
Creatinine, Ser: 0.93 mg/dL (ref 0.61–1.24)
GFR, Estimated: >60 mL/min (ref >60)
Glucose, Bld: 177 mg/dL — ABNORMAL HIGH (ref 70–99)
Potassium: 4.7 mmol/L (ref 3.5–5.1)
Sodium: 141 mmol/L (ref 135–145)

## 2024-02-07 LAB — I-STAT CHEM 8, ED
BUN: 11 mg/dL (ref 8–23)
Calcium, Ion: 1.16 mmol/L (ref 1.15–1.40)
Chloride: 103 mmol/L (ref 98–111)
Creatinine, Ser: 1 mg/dL (ref 0.61–1.24)
Glucose, Bld: 176 mg/dL — ABNORMAL HIGH (ref 70–99)
HCT: 44 % (ref 39.0–52.0)
Hemoglobin: 15 g/dL (ref 13.0–17.0)
Potassium: 4.7 mmol/L (ref 3.5–5.1)
Sodium: 143 mmol/L (ref 135–145)
TCO2: 28 mmol/L (ref 22–32)

## 2024-02-07 LAB — C-REACTIVE PROTEIN: CRP: 5 mg/dL — ABNORMAL HIGH (ref <1.0)

## 2024-02-07 LAB — SEDIMENTATION RATE: Sed Rate: 26 mm/h — ABNORMAL HIGH (ref 0–16)
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
## 2024-02-07 NOTE — ED Triage Notes (Addendum)
 Pt to ED c/o lower back pain, ongoing problem x 6 months, here today from PCP office recommendation. Ambulatory in triage, denies bowel/bladder incontinence.

## 2024-02-08 MED ORDER — AMOXICILLIN-POT CLAVULANATE 875-125 MG PO TABS: 1.0000 | ORAL_TABLET | Freq: Two times a day (BID) | ORAL | 0 refills | Status: DC

## 2024-02-08 MED ORDER — DOXYCYCLINE HYCLATE 100 MG PO CAPS: 100.0000 mg | ORAL_CAPSULE | Freq: Two times a day (BID) | ORAL | 0 refills | Status: DC

## 2024-02-08 MED ORDER — SODIUM CHLORIDE 0.9 % IV SOLN
2.0000 g | Freq: Once | INTRAVENOUS | Status: AC
  Administered 2024-02-08: 2 g via INTRAVENOUS
  Filled 2024-02-08: qty 20

## 2024-02-08 MED ORDER — DOXYCYCLINE HYCLATE 100 MG PO TABS
100.0000 mg | ORAL_TABLET | Freq: Once | ORAL | Status: AC
  Administered 2024-02-08: 100 mg via ORAL
  Filled 2024-02-08: qty 1

## 2024-02-08 NOTE — ED Provider Notes (Signed)
 Care of patient received from prior provider at 12:01 AM, please see their note for complete H/P and care plan.  Received handoff per ED course.  Clinical Course as of 02/08/24 0001  Thu Feb 07, 2024  2303 Stable HO DF Weakness/back pain for months HX of Epidural abscess Seen PCP  Inflamatory markers elevated Total neuro axis MRIs ordered per PCP request Symptoms are chronic (6 months) [CC]    Clinical Course User Index [CC] Jerral Meth, MD    Reassessment: Care patient received prior provider as above. I was called to bedside as patient is refusing all of the prior workup.  He has a history of schizophrenia but remains in control of his own legal guardianship. Daughter is at bedside and states that his symptoms are very chronic in nature months and months of progressive back pain.  He had an outpatient MRI on the 30th that demonstrated a fluid collection.  Had outpatient blood work done that showed ESR/CRP elevated. 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 It was recommended for patient come to the hospital for contrasted MRI.  Patient has refused contrast administration.  He had part of a  MRI cervical protocol and per technologist had a panic attack and hit the emergency release button.  He stated he would not go back in the MRI machine. He stated that MRI contrast makes him feel miserable.  Discussed possibility of noncontrasted studies including noncontrast MRI and patient refused stating he would not go back in the MRI machine ever again.  Daughter attempted to reorient but patient continued to refuse We discussed risk of missed disease including permanent neurologic dysfunction including paresis should he refuse further workup. Discussed alternatives of MRI including empiric management for myositis/abscess Stated that this could be an admission for IV antibiotics and neurosurgical  consultation but patient stated he would want to be discharged and he is tired of being in the hospital. Attempted to reorient the patient to the severity of his presentation but he refused.  Notably at this time he had already ordered dinner from a restaurant and was already eating at this by my recommendation that he stay n.p.o. for possible surgical intervention. Daughter was able to convince patient to get a single dose of IV antibiotics and requested a repeat neurosurgical referral to attempt to get him outpatient care.  We discussed risk of patient's demand for discharge including the above neurologic disease, sepsis, death and patient expressed understanding but demanded immediate discharge.  On review of the immediate literature.  It appears that Staph aureus is implicated in most cases of myositis. Will include coverage with doxycycline  as well as broader gram-positive coverage with p.o. Augmentin . This is still grossly inferior to preferred regiment of cephalosporin and vancomycin but patient continues to demand discharge.  Disposition:  Patient is requesting discharge at this time.  Given patient's understanding of risk of severe missed diagnosis based on limitations of today's evaluation and risk of interval worsening of disease including life or limb threatening pathology, will participate in shared medical decision making and patient directed discharge at this time.  Patient is welcome to return for further diagnostic evaluation/therapeutic management at any time.     Jerral Meth, MD 02/08/24 0010

## 2024-02-08 NOTE — ED Provider Notes (Incomplete)
 Care of patient received from prior provider at 12:01 AM, please see their note for complete H/P and care plan.  Received handoff per ED course.  Clinical Course as of 02/08/24 0001  Thu Feb 07, 2024  2303 Stable HO DF Weakness/back pain for months HX of Epidural abscess Seen PCP  Inflamatory markers elevated Total neuro axis MRIs ordered per PCP request Symptoms are chronic (6 months) [CC]    Clinical Course User Index [CC] Jerral Meth, MD    Reassessment: Care patient received prior provider as above. I was called to bedside as patient is refusing all of the prior workup.  He has a history of schizophrenia but remains in control of his own legal guardianship. Daughter is at bedside and states that his symptoms are very chronic in nature months and months of progressive back pain.  He had an outpatient MRI on the 30th that demonstrated a fluid collection.  Had outpatient blood work done that showed ESR/CRP elevated. 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 It was recommended for patient come to the hospital for contrasted MRI.  Patient has refused contrast administration.  He had part of a  MRI cervical protocol and per technologist had a panic attack and hit the emergency release button.  He stated he would not go back in the MRI machine. He stated that MRI contrast makes him feel miserable.

## 2024-02-08 NOTE — Discharge Instructions (Signed)
 You were seen today for back pain.  You appear to have myositis or inflammation/infection of the muscles of your lower spine.  The MRI done by your primary care doctor shows that it likely goes into your spinal canal.  I recommended you be admitted for neurosurgery evaluation, consultation and repeat MRI with contrast to evaluate if this infection extends into your spine.  At the very least for IV antibiotics.  However you have stated you wanted to be discharged as soon as possible.  I given you the first dose of antibiotics IV after our conversation and started you on p.o. antibiotics.  You will need to follow-up with the spine center.  I attached their phone number and you need to follow-up with your primary care provider within 48 hours for ongoing care and management. Should you wish to pursue further care, you may return to the emergency department anytime.

## 2024-02-14 ENCOUNTER — Other Ambulatory Visit: Payer: Self-pay | Admitting: Student

## 2024-02-20 DIAGNOSIS — M4802 Spinal stenosis, cervical region: Secondary | ICD-10-CM | POA: Diagnosis not present

## 2024-02-20 DIAGNOSIS — M5416 Radiculopathy, lumbar region: Secondary | ICD-10-CM | POA: Diagnosis not present

## 2024-02-21 ENCOUNTER — Ambulatory Visit (HOSPITAL_BASED_OUTPATIENT_CLINIC_OR_DEPARTMENT_OTHER)

## 2024-02-21 VITALS — BP 161/91 | HR 76 | Ht 72.5 in | Wt 205.0 lb

## 2024-02-21 DIAGNOSIS — F200 Paranoid schizophrenia: Secondary | ICD-10-CM

## 2024-02-21 NOTE — Progress Notes (Signed)
 Pt presents today for  arrives today for his due injection of Prolixin  25mg  per 1mL. This was given and tolerated with no problems in pt's LEFT DELTOID. Patient presents to be very conversational, mentions that at his Doc's app he may loose his ability to walk. Pt does worry about this, other than this pt is well groomed and has a person that drives him to his appointment. Patient denies all AVH, SI, and HI. Pt will be returning in two weeks for injection.   NDC: 9856-0470-98 LOT: [id number] EXP: 2026/06

## 2024-03-04 DIAGNOSIS — M5416 Radiculopathy, lumbar region: Secondary | ICD-10-CM | POA: Diagnosis not present

## 2024-03-04 DIAGNOSIS — M47816 Spondylosis without myelopathy or radiculopathy, lumbar region: Secondary | ICD-10-CM | POA: Diagnosis not present

## 2024-03-04 DIAGNOSIS — M47817 Spondylosis without myelopathy or radiculopathy, lumbosacral region: Secondary | ICD-10-CM | POA: Diagnosis not present

## 2024-03-04 DIAGNOSIS — M5137 Other intervertebral disc degeneration, lumbosacral region with discogenic back pain only: Secondary | ICD-10-CM | POA: Diagnosis not present

## 2024-03-04 DIAGNOSIS — M4807 Spinal stenosis, lumbosacral region: Secondary | ICD-10-CM | POA: Diagnosis not present

## 2024-03-06 ENCOUNTER — Ambulatory Visit (HOSPITAL_COMMUNITY): Admitting: *Deleted

## 2024-03-06 ENCOUNTER — Telehealth (HOSPITAL_COMMUNITY): Payer: Self-pay | Admitting: *Deleted

## 2024-03-06 NOTE — Telephone Encounter (Signed)
 Second attempt to f/u with pt regarding missed appointment today. Did not receive an answer at home or mobile numbers, and was unable  LVM.

## 2024-03-06 NOTE — Telephone Encounter (Signed)
 Pt did not show today for due Prolixin  25 mg injection (q 2 weeks). This nurse called pt home and mobile numbers without reaching pt. Unable to LVM.

## 2024-03-06 NOTE — Telephone Encounter (Signed)
 Need to keep trying and may be need to send a letter.

## 2024-03-06 NOTE — Telephone Encounter (Signed)
 Yes. Will keep trying and can also reach out to his daughter or brother.

## 2024-03-13 ENCOUNTER — Ambulatory Visit (HOSPITAL_BASED_OUTPATIENT_CLINIC_OR_DEPARTMENT_OTHER): Admitting: *Deleted

## 2024-03-13 VITALS — BP 137/86 | HR 69 | Resp 20

## 2024-03-13 DIAGNOSIS — F200 Paranoid schizophrenia: Secondary | ICD-10-CM

## 2024-03-13 NOTE — Patient Instructions (Signed)
 Pt in clinic today for due Prolixin  25 mg injection. Pt is cooperative. Pt brightens at times when interacting with staff. Pt has ongoing c/o back pain and thinks this may be r/t the Prolixin . Injection prepared as ordered and given, without complaint, in RD. Pt to return in 2 weeks for next due injection.

## 2024-03-27 ENCOUNTER — Ambulatory Visit (HOSPITAL_COMMUNITY)

## 2024-03-27 ENCOUNTER — Telehealth (HOSPITAL_COMMUNITY): Payer: Self-pay | Admitting: *Deleted

## 2024-03-27 DIAGNOSIS — M48062 Spinal stenosis, lumbar region with neurogenic claudication: Secondary | ICD-10-CM | POA: Diagnosis not present

## 2024-03-27 DIAGNOSIS — M4802 Spinal stenosis, cervical region: Secondary | ICD-10-CM | POA: Diagnosis not present

## 2024-03-27 NOTE — Telephone Encounter (Signed)
 Pt missed his LAI appointment again today. Writer called pts home and cell number and got no answer or VM to leave a message.

## 2024-03-31 ENCOUNTER — Other Ambulatory Visit (HOSPITAL_COMMUNITY): Payer: Self-pay | Admitting: *Deleted

## 2024-03-31 ENCOUNTER — Telehealth (HOSPITAL_COMMUNITY): Payer: Self-pay | Admitting: *Deleted

## 2024-03-31 NOTE — Telephone Encounter (Signed)
 Pt called this nurse to advise that he will be in clinic 04/03/24 1100 for overdue injection. Pt tangential and disorganized with baseline religious references but was joking with Clinical research associate on the phone. Pt LAI has been delivered to clinic.

## 2024-04-03 ENCOUNTER — Ambulatory Visit (HOSPITAL_COMMUNITY)

## 2024-04-03 ENCOUNTER — Telehealth (HOSPITAL_COMMUNITY): Payer: Self-pay | Admitting: *Deleted

## 2024-04-03 NOTE — Telephone Encounter (Signed)
 Writer received message from front desk that pt called and wanted to cx his Prolixin  injection scheduled for today. I called pt having to LVM. Pt was encouraged to call us  back to reschedule or we would keep him scheduled today if he were to change his mind.

## 2024-04-03 NOTE — Telephone Encounter (Signed)
 Please try to reach out to his family members if we have consent.  Thank you

## 2024-04-03 NOTE — Telephone Encounter (Signed)
 Yes    Will do

## 2024-04-04 ENCOUNTER — Other Ambulatory Visit: Payer: Self-pay | Admitting: Neurosurgery

## 2024-04-07 ENCOUNTER — Telehealth (HOSPITAL_COMMUNITY): Payer: Self-pay | Admitting: *Deleted

## 2024-04-07 NOTE — Telephone Encounter (Signed)
 Thanks for update

## 2024-04-07 NOTE — Telephone Encounter (Signed)
 Writer spoke with pt's daughter, Odella, to advise that pt has not been to this office for LAI since 03/13/24. Injection is bi-weekly. Pt also has not been answering my calls or returning messages. Odella says she's aware that he has missed several LAI appointments but she saw him last night and he seemed ok.  Pt's daughter stated that pt has multiple stressors that he's dealing with right now and may just want to deal with one thing at a time. Per [place], outside provider has initiated a referral for ACTT services from [employer] Daughter is aware of procedure for IVC and says she knows when pt is decompensating and states that she will continue to encourage compliance as pt has discontinued ALL his current medications. We will attempt to f/u with pt. He has no future appointments at this time.

## 2024-04-14 DIAGNOSIS — M4802 Spinal stenosis, cervical region: Secondary | ICD-10-CM | POA: Diagnosis not present

## 2024-04-14 NOTE — Pre-Procedure Instructions (Signed)
 Surgical Instructions   Your procedure is scheduled on April 18, 2024. Report to [facility] Main Entrance A at 10:20 A.M., then check in with the Admitting office. Any questions or running late day of surgery: call [phone]  Questions prior to your surgery date: call [phone], Monday-Friday, 8am-4pm. If you experience any cold or flu symptoms such as cough, fever, chills, shortness of breath, etc. between now and your scheduled surgery, please notify us  at the above number.     Remember:  Do not eat after midnight the night before your surgery  You may drink clear liquids until 9:20 AM the morning of your surgery.   Clear liquids allowed are: Water, Non-Citrus Juices (without pulp), Carbonated Beverages, Clear Tea (no milk, honey, etc.), Black Coffee Only (NO MILK, CREAM OR POWDERED CREAMER of any kind), and Gatorade.    Take these medicines the morning of surgery with A SIP OF WATER: rosuvastatin  (CRESTOR )    May take these medicines IF NEEDED: acetaminophen  (TYLENOL )    One week prior to surgery, STOP taking any Aspirin (unless otherwise instructed by your surgeon) Aleve , Naproxen , Ibuprofen , Motrin , Advil , Goody's, BC's, all herbal medications, fish oil, and non-prescription vitamins.   WHAT DO I DO ABOUT MY DIABETES MEDICATION?   STOP taking your empagliflozin  (JARDIANCE ) three days prior to surgery. DO NOT take any doses after October 13th.      HOW TO MANAGE YOUR DIABETES BEFORE AND AFTER SURGERY  Why is it important to control my blood sugar before and after surgery? Improving blood sugar levels before and after surgery helps healing and can limit problems. A way of improving blood sugar control is eating a healthy diet by:  Eating less sugar and carbohydrates  Increasing activity/exercise  Talking with your doctor about reaching your blood sugar goals High blood sugars (greater than 180 mg/dL) can raise your risk of infections and slow your recovery, so  you will need to focus on controlling your diabetes during the weeks before surgery. Make sure that the doctor who takes care of your diabetes knows about your planned surgery including the date and location.  How do I manage my blood sugar before surgery? Check your blood sugar at least 4 times a day, starting 2 days before surgery, to make sure that the level is not too high or low.  Check your blood sugar the morning of your surgery when you wake up and every 2 hours until you get to the Short Stay unit.  If your blood sugar is less than 70 mg/dL, you will need to treat for low blood sugar: Do not take insulin . Treat a low blood sugar (less than 70 mg/dL) with  cup of clear juice (cranberry or apple), 4 glucose tablets, OR glucose gel. Recheck blood sugar in 15 minutes after treatment (to make sure it is greater than 70 mg/dL). If your blood sugar is not greater than 70 mg/dL on recheck, call [phone] for further instructions. Report your blood sugar to the short stay nurse when you get to Short Stay.  If you are admitted to the hospital after surgery: Your blood sugar will be checked by the staff and you will probably be given insulin  after surgery (instead of oral diabetes medicines) to make sure you have good blood sugar levels. The goal for blood sugar control after surgery is 80-180 mg/dL.                      Do NOT Smoke (Tobacco/Vaping)  for 24 hours prior to your procedure.  If you use a CPAP at night, you may bring your mask/headgear for your overnight stay.   You will be asked to remove any contacts, glasses, piercing's, hearing aid's, dentures/partials prior to surgery. Please bring cases for these items if needed.    Patients discharged the day of surgery will not be allowed to drive home, and someone needs to stay with them for 24 hours.  SURGICAL WAITING ROOM VISITATION Patients may have no more than 2 support people in the waiting area - these visitors may rotate.    Pre-op nurse will coordinate an appropriate time for 1 ADULT support person, who may not rotate, to accompany patient in pre-op.  Children under the age of 50 must have an adult with them who is not the patient and must remain in the main waiting area with an adult.  If the patient needs to stay at the hospital during part of their recovery, the visitor guidelines for inpatient rooms apply.  Please refer to the [name] website for the visitor guidelines for any additional information.   If you received a COVID test during your pre-op visit  it is requested that you wear a mask when out in public, stay away from anyone that may not be feeling well and notify your surgeon if you develop symptoms. If you have been in contact with anyone that has tested positive in the last 10 days please notify you surgeon.      Pre-operative 4 CHG Bathing Instructions   You can play a key role in reducing the risk of infection after surgery. Your skin needs to be as free of germs as possible. You can reduce the number of germs on your skin by washing with CHG (chlorhexidine  gluconate) soap before surgery. CHG is an antiseptic soap that kills germs and continues to kill germs even after washing.   DO NOT use if you have an allergy to chlorhexidine /CHG or antibacterial soaps. If your skin becomes reddened or irritated, stop using the CHG and notify one of our RNs at [phone].   Please shower with the CHG soap starting 4 days before surgery using the following schedule:     Please keep in mind the following:  DO NOT shave, including legs and underarms, starting the day of your first shower.   You may shave your face at any point before/day of surgery.  Place clean sheets on your bed the day you start using CHG soap. Use a clean washcloth (not used since being washed) for each shower. DO NOT sleep with pets once you start using the CHG.   CHG Shower Instructions:  Wash your face and private area  with normal soap. If you choose to wash your hair, wash first with your normal shampoo.  After you use shampoo/soap, rinse your hair and body thoroughly to remove shampoo/soap residue.  Turn the water OFF and apply  bottle of CHG soap to a CLEAN washcloth.  Apply CHG soap ONLY FROM YOUR NECK DOWN TO YOUR TOES (washing for 3-5 minutes)  DO NOT use CHG soap on face, private areas, open wounds, or sores.  Pay special attention to the area where your surgery is being performed.  If you are having back surgery, having someone wash your back for you may be helpful. Wait 2 minutes after CHG soap is applied, then you may rinse off the CHG soap.  Pat dry with a clean towel  Put on clean clothes/pajamas  If you choose to wear lotion, please use ONLY the CHG-compatible lotions that are listed below.  Additional instructions for the day of surgery:  If you choose, you may shower the morning of surgery with an antibacterial soap.  DO NOT APPLY any lotions, deodorants, cologne, or perfumes.   Do not bring valuables to the hospital. [facility] is not responsible for any belongings/valuables. Do not wear nail polish, gel polish, artificial nails, or any other type of covering on natural nails (fingers and toes) Do not wear jewelry or makeup Put on clean/comfortable clothes.  Please brush your teeth.  Ask your nurse before applying any prescription medications to the skin.     CHG Compatible Lotions   Aveeno Moisturizing lotion  Cetaphil Moisturizing Cream  Cetaphil Moisturizing Lotion  Clairol Herbal Essence Moisturizing Lotion, Dry Skin  Clairol Herbal Essence Moisturizing Lotion, Extra Dry Skin  Clairol Herbal Essence Moisturizing Lotion, Normal Skin  Curel Age Defying Therapeutic Moisturizing Lotion with Alpha Hydroxy  Curel Extreme Care Body Lotion  Curel Soothing Hands Moisturizing Hand Lotion  Curel Therapeutic Moisturizing Cream, Fragrance-Free  Curel Therapeutic Moisturizing Lotion,  Fragrance-Free  Curel Therapeutic Moisturizing Lotion, Original Formula  Eucerin Daily Replenishing Lotion  Eucerin Dry Skin Therapy Plus Alpha Hydroxy Crme  Eucerin Dry Skin Therapy Plus Alpha Hydroxy Lotion  Eucerin Original Crme  Eucerin Original Lotion  Eucerin Plus Crme Eucerin Plus Lotion  Eucerin TriLipid Replenishing Lotion  Keri Anti-Bacterial Hand Lotion  Keri Deep Conditioning Original Lotion Dry Skin Formula Softly Scented  Keri Deep Conditioning Original Lotion, Fragrance Free Sensitive Skin Formula  Keri Lotion Fast Absorbing Fragrance Free Sensitive Skin Formula  Keri Lotion Fast Absorbing Softly Scented Dry Skin Formula  Keri Original Lotion  Keri Skin Renewal Lotion Keri Silky Smooth Lotion  Keri Silky Smooth Sensitive Skin Lotion  Nivea Body Creamy Conditioning Oil  Nivea Body Extra Enriched Lotion  Nivea Body Original Lotion  Nivea Body Sheer Moisturizing Lotion Nivea Crme  Nivea Skin Firming Lotion  NutraDerm 30 Skin Lotion  NutraDerm Skin Lotion  NutraDerm Therapeutic Skin Cream  NutraDerm Therapeutic Skin Lotion  ProShield Protective Hand Cream  Provon moisturizing lotion  Please read over the following fact sheets that you were given.

## 2024-04-15 ENCOUNTER — Other Ambulatory Visit: Payer: Self-pay

## 2024-04-15 ENCOUNTER — Encounter (HOSPITAL_COMMUNITY): Payer: Self-pay

## 2024-04-15 ENCOUNTER — Inpatient Hospital Stay (HOSPITAL_COMMUNITY)
Admission: RE | Admit: 2024-04-15 | Discharge: 2024-04-15 | Disposition: A | Source: Ambulatory Visit | Attending: Neurosurgery

## 2024-04-15 VITALS — BP 134/92 | HR 90 | Temp 97.9°F | Resp 18 | Ht 72.0 in | Wt 209.0 lb

## 2024-04-15 DIAGNOSIS — G992 Myelopathy in diseases classified elsewhere: Secondary | ICD-10-CM | POA: Diagnosis not present

## 2024-04-15 DIAGNOSIS — Z832 Family history of diseases of the blood and blood-forming organs and certain disorders involving the immune mechanism: Secondary | ICD-10-CM | POA: Diagnosis not present

## 2024-04-15 DIAGNOSIS — Z79899 Other long term (current) drug therapy: Secondary | ICD-10-CM | POA: Diagnosis not present

## 2024-04-15 DIAGNOSIS — I2510 Atherosclerotic heart disease of native coronary artery without angina pectoris: Secondary | ICD-10-CM | POA: Diagnosis not present

## 2024-04-15 DIAGNOSIS — Z01818 Encounter for other preprocedural examination: Secondary | ICD-10-CM

## 2024-04-15 DIAGNOSIS — E119 Type 2 diabetes mellitus without complications: Secondary | ICD-10-CM | POA: Diagnosis not present

## 2024-04-15 DIAGNOSIS — M5412 Radiculopathy, cervical region: Secondary | ICD-10-CM | POA: Diagnosis not present

## 2024-04-15 DIAGNOSIS — Z833 Family history of diabetes mellitus: Secondary | ICD-10-CM | POA: Diagnosis not present

## 2024-04-15 DIAGNOSIS — F17210 Nicotine dependence, cigarettes, uncomplicated: Secondary | ICD-10-CM | POA: Diagnosis not present

## 2024-04-15 DIAGNOSIS — Z888 Allergy status to other drugs, medicaments and biological substances status: Secondary | ICD-10-CM | POA: Diagnosis not present

## 2024-04-15 DIAGNOSIS — Z8249 Family history of ischemic heart disease and other diseases of the circulatory system: Secondary | ICD-10-CM | POA: Diagnosis not present

## 2024-04-15 DIAGNOSIS — Z7984 Long term (current) use of oral hypoglycemic drugs: Secondary | ICD-10-CM | POA: Diagnosis not present

## 2024-04-15 DIAGNOSIS — Z8269 Family history of other diseases of the musculoskeletal system and connective tissue: Secondary | ICD-10-CM | POA: Diagnosis not present

## 2024-04-15 DIAGNOSIS — I10 Essential (primary) hypertension: Secondary | ICD-10-CM | POA: Diagnosis not present

## 2024-04-15 DIAGNOSIS — M4802 Spinal stenosis, cervical region: Secondary | ICD-10-CM | POA: Diagnosis not present

## 2024-04-15 HISTORY — DX: Atherosclerotic heart disease of native coronary artery without angina pectoris: I25.10

## 2024-04-15 LAB — BASIC METABOLIC PANEL WITH GFR
Anion gap: 14 (ref 5–15)
BUN: 11 mg/dL (ref 8–23)
CO2: 22 mmol/L (ref 22–32)
Calcium: 9 mg/dL (ref 8.9–10.3)
Chloride: 99 mmol/L (ref 98–111)
Creatinine, Ser: 1.24 mg/dL (ref 0.61–1.24)
GFR, Estimated: >60 mL/min (ref >60)
Glucose, Bld: 108 mg/dL — ABNORMAL HIGH (ref 70–99)
Potassium: 4.2 mmol/L (ref 3.5–5.1)
Sodium: 135 mmol/L (ref 135–145)

## 2024-04-15 LAB — CBC
HCT: 45.8 % (ref 39.0–52.0)
Hemoglobin: 14 g/dL (ref 13.0–17.0)
MCH: 22.8 pg — ABNORMAL LOW (ref 26.0–34.0)
MCHC: 30.6 g/dL (ref 30.0–36.0)
MCV: 74.7 fL — ABNORMAL LOW (ref 80.0–100.0)
Platelets: 218 K/uL (ref 150–400)
RBC: 6.13 MIL/uL — ABNORMAL HIGH (ref 4.22–5.81)
RDW: 18.8 % — ABNORMAL HIGH (ref 11.5–15.5)
WBC: 9.2 K/uL (ref 4.0–10.5)
nRBC: 0 % (ref 0.0–0.2)

## 2024-04-15 LAB — SURGICAL PCR SCREEN
MRSA, PCR: NEGATIVE
Staphylococcus aureus: NEGATIVE

## 2024-04-15 LAB — GLUCOSE, CAPILLARY: Glucose-Capillary: 140 mg/dL — ABNORMAL HIGH (ref 70–99)

## 2024-04-15 LAB — NO BLOOD PRODUCTS

## 2024-04-15 LAB — HEMOGLOBIN A1C
Hgb A1c MFr Bld: 7.5 % — ABNORMAL HIGH (ref 4.8–5.6)
Mean Plasma Glucose: 168.55 mg/dL

## 2024-04-15 NOTE — Progress Notes (Signed)
 Pt states he does not want any blood products given to him. Blood refusal form signed by pt and faxed to [facility]. Shanda Gavel, OR scheduler with Dr. Marda office called and VM left with this information noting that a Type and Screen was also not completed due to refusal.

## 2024-04-15 NOTE — Progress Notes (Signed)
 PCP - [facility], DO Cardiologist - Pt saw Dr. Dann in 2022 for CP. Cardiac CT completed with minimal CAD seen. No follow-up.  PPM/ICD - Denies Device Orders - n/a Rep Notified - n/a  Chest x-ray - n/a EKG - 04/15/2024 Stress Test - Denies ECHO - Denies Cardiac Cath - Denies  Sleep Study - Denies CPAP - n/a  Pt is DM2. He does not own a glucose meter therefore does not check sugars and does not know normal fasting range. CBG at pre-op appointment 140. A1c result pending.  Last dose of GLP1 agonist- n/a GLP1 instructions: n/a  Blood Thinner Instructions: n/a Aspirin Instructions: n/a  ERAS Protcol - Clear liquids until 0920 morning of surgery PRE-SURGERY Ensure or G2- n/a  COVID TEST- n/a   Anesthesia review: No. Pt instructed to hold Jardiance  three days prior to surgery and not to take any doses starting today.   Patient denies shortness of breath, fever, cough and chest pain at PAT appointment. Pt denies any respiratory illness/infection in the last two months.   All instructions explained to the patient, with a verbal understanding of the material. Patient agrees to go over the instructions while at home for a better understanding. Patient also instructed to self quarantine after being tested for COVID-19. The opportunity to ask questions was provided.

## 2024-04-18 ENCOUNTER — Inpatient Hospital Stay (HOSPITAL_COMMUNITY): Admitting: Anesthesiology

## 2024-04-18 ENCOUNTER — Encounter (HOSPITAL_COMMUNITY)
Admission: RE | Disposition: A | Discharge status: Home / Self Care | Payer: Self-pay | Source: Home / Self Care | Attending: Neurosurgery

## 2024-04-18 ENCOUNTER — Inpatient Hospital Stay (HOSPITAL_COMMUNITY)
Admission: RE | Admit: 2024-04-18 | Discharge: 2024-04-19 | DRG: 472 | Disposition: A | Discharge status: Home / Self Care | Attending: Neurosurgery | Admitting: Neurosurgery

## 2024-04-18 ENCOUNTER — Inpatient Hospital Stay (HOSPITAL_COMMUNITY)

## 2024-04-18 DIAGNOSIS — F200 Paranoid schizophrenia: Secondary | ICD-10-CM

## 2024-04-18 DIAGNOSIS — Z79899 Other long term (current) drug therapy: Secondary | ICD-10-CM

## 2024-04-18 DIAGNOSIS — I10 Essential (primary) hypertension: Secondary | ICD-10-CM | POA: Diagnosis present

## 2024-04-18 DIAGNOSIS — Z8249 Family history of ischemic heart disease and other diseases of the circulatory system: Secondary | ICD-10-CM | POA: Diagnosis not present

## 2024-04-18 DIAGNOSIS — G992 Myelopathy in diseases classified elsewhere: Secondary | ICD-10-CM | POA: Diagnosis present

## 2024-04-18 DIAGNOSIS — I2510 Atherosclerotic heart disease of native coronary artery without angina pectoris: Secondary | ICD-10-CM | POA: Diagnosis present

## 2024-04-18 DIAGNOSIS — M5412 Radiculopathy, cervical region: Secondary | ICD-10-CM | POA: Diagnosis present

## 2024-04-18 DIAGNOSIS — Z981 Arthrodesis status: Secondary | ICD-10-CM | POA: Diagnosis not present

## 2024-04-18 DIAGNOSIS — F17210 Nicotine dependence, cigarettes, uncomplicated: Secondary | ICD-10-CM | POA: Diagnosis present

## 2024-04-18 DIAGNOSIS — M4802 Spinal stenosis, cervical region: Secondary | ICD-10-CM

## 2024-04-18 DIAGNOSIS — Z8269 Family history of other diseases of the musculoskeletal system and connective tissue: Secondary | ICD-10-CM | POA: Diagnosis not present

## 2024-04-18 DIAGNOSIS — Z833 Family history of diabetes mellitus: Secondary | ICD-10-CM | POA: Diagnosis not present

## 2024-04-18 DIAGNOSIS — N400 Enlarged prostate without lower urinary tract symptoms: Secondary | ICD-10-CM | POA: Diagnosis present

## 2024-04-18 DIAGNOSIS — M4712 Other spondylosis with myelopathy, cervical region: Principal | ICD-10-CM | POA: Diagnosis present

## 2024-04-18 DIAGNOSIS — M5001 Cervical disc disorder with myelopathy,  high cervical region: Secondary | ICD-10-CM | POA: Diagnosis not present

## 2024-04-18 DIAGNOSIS — Z813 Family history of other psychoactive substance abuse and dependence: Secondary | ICD-10-CM

## 2024-04-18 DIAGNOSIS — Z832 Family history of diseases of the blood and blood-forming organs and certain disorders involving the immune mechanism: Secondary | ICD-10-CM

## 2024-04-18 DIAGNOSIS — Z7984 Long term (current) use of oral hypoglycemic drugs: Secondary | ICD-10-CM

## 2024-04-18 DIAGNOSIS — E119 Type 2 diabetes mellitus without complications: Secondary | ICD-10-CM | POA: Diagnosis present

## 2024-04-18 DIAGNOSIS — Z888 Allergy status to other drugs, medicaments and biological substances status: Secondary | ICD-10-CM

## 2024-04-18 HISTORY — PX: ANTERIOR CERVICAL DECOMP/DISCECTOMY FUSION: SHX1161

## 2024-04-18 LAB — GLUCOSE, CAPILLARY
Glucose-Capillary: 139 mg/dL — ABNORMAL HIGH (ref 70–99)
Glucose-Capillary: 127 mg/dL — ABNORMAL HIGH (ref 70–99)
Glucose-Capillary: 102 mg/dL — ABNORMAL HIGH (ref 70–99)
Glucose-Capillary: 117 mg/dL — ABNORMAL HIGH (ref 70–99)

## 2024-04-18 SURGERY — ANTERIOR CERVICAL DECOMPRESSION/DISCECTOMY FUSION 3 LEVELS
Anesthesia: General | Site: Spine Cervical

## 2024-04-18 MED ORDER — CEFAZOLIN SODIUM-DEXTROSE 1-4 GM/50ML-% IV SOLN
1.0000 g | Freq: Three times a day (TID) | INTRAVENOUS | Status: AC
  Administered 2024-04-18 – 2024-04-19 (×2): 1 g via INTRAVENOUS
  Filled 2024-04-18 (×2): qty 50

## 2024-04-18 MED ORDER — SODIUM CHLORIDE 0.9% FLUSH
3.0000 mL | Freq: Two times a day (BID) | INTRAVENOUS | Status: DC
  Administered 2024-04-18: 3 mL via INTRAVENOUS

## 2024-04-18 MED ORDER — ROCURONIUM BROMIDE 10 MG/ML (PF) SYRINGE
PREFILLED_SYRINGE | INTRAVENOUS | Status: AC
  Filled 2024-04-18: qty 10

## 2024-04-18 MED ORDER — FENTANYL CITRATE (PF) 250 MCG/5ML IJ SOLN
INTRAMUSCULAR | Status: DC | PRN
  Administered 2024-04-18: 50 ug via INTRAVENOUS
  Administered 2024-04-18: 100 ug via INTRAVENOUS
  Administered 2024-04-18: 50 ug via INTRAVENOUS

## 2024-04-18 MED ORDER — THROMBIN 20000 UNITS EX SOLR
CUTANEOUS | Status: AC
  Filled 2024-04-18: qty 20000

## 2024-04-18 MED ORDER — ONDANSETRON HCL 4 MG/2ML IJ SOLN
INTRAMUSCULAR | Status: DC | PRN
  Administered 2024-04-18: 4 mg via INTRAVENOUS

## 2024-04-18 MED ORDER — MENTHOL 3 MG MT LOZG: 1.0000 | LOZENGE | OROMUCOSAL | Status: DC | PRN

## 2024-04-18 MED ORDER — ACETAMINOPHEN 325 MG PO TABS
650.0000 mg | ORAL_TABLET | ORAL | Status: DC | PRN
  Administered 2024-04-19: 650 mg via ORAL
  Filled 2024-04-18: qty 2

## 2024-04-18 MED ORDER — CYCLOBENZAPRINE HCL 10 MG PO TABS: 10.0000 mg | ORAL_TABLET | Freq: Three times a day (TID) | ORAL | Status: DC | PRN

## 2024-04-18 MED ORDER — LACTATED RINGERS IV SOLN: INTRAVENOUS | Status: DC

## 2024-04-18 MED ORDER — THROMBIN 5000 UNITS EX KIT
PACK | CUTANEOUS | Status: AC
  Filled 2024-04-18: qty 1

## 2024-04-18 MED ORDER — CHLORHEXIDINE GLUCONATE 0.12 % MT SOLN
15.0000 mL | Freq: Once | OROMUCOSAL | Status: AC
  Administered 2024-04-18: 15 mL via OROMUCOSAL
  Filled 2024-04-18: qty 15

## 2024-04-18 MED ORDER — FLUPHENAZINE DECANOATE 25 MG/ML IJ SOLN: 25.0000 mg | INTRAMUSCULAR | Status: DC

## 2024-04-18 MED ORDER — OXYCODONE HCL 5 MG/5ML PO SOLN: 5.0000 mg | Freq: Once | ORAL | Status: DC | PRN

## 2024-04-18 MED ORDER — LIDOCAINE 2% (20 MG/ML) 5 ML SYRINGE
INTRAMUSCULAR | Status: DC | PRN
  Administered 2024-04-18: 100 mg via INTRAVENOUS

## 2024-04-18 MED ORDER — AMISULPRIDE (ANTIEMETIC) 5 MG/2ML IV SOLN: 10.0000 mg | Freq: Once | INTRAVENOUS | Status: DC | PRN

## 2024-04-18 MED ORDER — THROMBIN 5000 UNITS EX SOLR
OROMUCOSAL | Status: DC | PRN
  Administered 2024-04-18: 5 mL via TOPICAL

## 2024-04-18 MED ORDER — FENTANYL CITRATE (PF) 250 MCG/5ML IJ SOLN
INTRAMUSCULAR | Status: AC
  Filled 2024-04-18: qty 5

## 2024-04-18 MED ORDER — PROPOFOL 10 MG/ML IV BOLUS
INTRAVENOUS | Status: DC | PRN
  Administered 2024-04-18: 160 mg via INTRAVENOUS

## 2024-04-18 MED ORDER — LIDOCAINE 2% (20 MG/ML) 5 ML SYRINGE
INTRAMUSCULAR | Status: AC
  Filled 2024-04-18: qty 5

## 2024-04-18 MED ORDER — SUGAMMADEX SODIUM 200 MG/2ML IV SOLN
INTRAVENOUS | Status: DC | PRN
  Administered 2024-04-18: 200 mg via INTRAVENOUS

## 2024-04-18 MED ORDER — ROCURONIUM BROMIDE 10 MG/ML (PF) SYRINGE
PREFILLED_SYRINGE | INTRAVENOUS | Status: DC | PRN
  Administered 2024-04-18: 60 mg via INTRAVENOUS
  Administered 2024-04-18: 30 mg via INTRAVENOUS
  Administered 2024-04-18: 10 mg via INTRAVENOUS

## 2024-04-18 MED ORDER — ROSUVASTATIN CALCIUM 20 MG PO TABS: 20.0000 mg | ORAL_TABLET | ORAL | Status: DC

## 2024-04-18 MED ORDER — MIDAZOLAM HCL (PF) 2 MG/2ML IJ SOLN
INTRAMUSCULAR | Status: DC | PRN
  Administered 2024-04-18: 1 mg via INTRAVENOUS

## 2024-04-18 MED ORDER — OXYCODONE HCL 5 MG PO TABS: 5.0000 mg | ORAL_TABLET | Freq: Once | ORAL | Status: DC | PRN

## 2024-04-18 MED ORDER — MIDAZOLAM HCL 2 MG/2ML IJ SOLN
INTRAMUSCULAR | Status: AC
  Filled 2024-04-18: qty 2

## 2024-04-18 MED ORDER — HYDROCODONE-ACETAMINOPHEN 10-325 MG PO TABS: 2.0000 | ORAL_TABLET | ORAL | Status: DC | PRN

## 2024-04-18 MED ORDER — FLUPHENAZINE DECANOATE 25 MG/ML IJ SOLN
25.0000 mg | INTRAMUSCULAR | Status: DC
  Administered 2024-04-18: 25 mg via INTRAMUSCULAR
  Filled 2024-04-18: qty 1

## 2024-04-18 MED ORDER — ONDANSETRON HCL 4 MG/2ML IJ SOLN: 4.0000 mg | Freq: Four times a day (QID) | INTRAMUSCULAR | Status: DC | PRN

## 2024-04-18 MED ORDER — ACETAMINOPHEN 500 MG PO TABS
1000.0000 mg | ORAL_TABLET | Freq: Once | ORAL | Status: AC
  Administered 2024-04-18: 1000 mg via ORAL
  Filled 2024-04-18: qty 2

## 2024-04-18 MED ORDER — AMLODIPINE BESYLATE 5 MG PO TABS
5.0000 mg | ORAL_TABLET | Freq: Every day | ORAL | Status: DC
  Administered 2024-04-19: 5 mg via ORAL
  Filled 2024-04-18: qty 1

## 2024-04-18 MED ORDER — THROMBIN 20000 UNITS EX SOLR
CUTANEOUS | Status: DC | PRN
  Administered 2024-04-18: 20 mL via TOPICAL

## 2024-04-18 MED ORDER — PROPOFOL 10 MG/ML IV BOLUS
INTRAVENOUS | Status: AC
  Filled 2024-04-18: qty 20

## 2024-04-18 MED ORDER — CEFAZOLIN SODIUM-DEXTROSE 2-4 GM/100ML-% IV SOLN
2.0000 g | INTRAVENOUS | Status: AC
  Administered 2024-04-18: 2 g via INTRAVENOUS
  Filled 2024-04-18: qty 100

## 2024-04-18 MED ORDER — HYDROCODONE-ACETAMINOPHEN 5-325 MG PO TABS
1.0000 | ORAL_TABLET | ORAL | Status: DC | PRN
  Administered 2024-04-18 – 2024-04-19 (×2): 1 via ORAL
  Filled 2024-04-18 (×2): qty 1

## 2024-04-18 MED ORDER — IRBESARTAN 300 MG PO TABS
300.0000 mg | ORAL_TABLET | Freq: Every day | ORAL | Status: DC
  Administered 2024-04-19: 300 mg via ORAL
  Filled 2024-04-18: qty 1

## 2024-04-18 MED ORDER — CHLORHEXIDINE GLUCONATE CLOTH 2 % EX PADS
6.0000 | MEDICATED_PAD | Freq: Once | CUTANEOUS | Status: AC
  Administered 2024-04-18: 6 via TOPICAL

## 2024-04-18 MED ORDER — INSULIN ASPART 100 UNIT/ML IJ SOLN: 0.0000 [IU] | INTRAMUSCULAR | Status: DC | PRN

## 2024-04-18 MED ORDER — 0.9 % SODIUM CHLORIDE (POUR BTL) OPTIME
TOPICAL | Status: DC | PRN
  Administered 2024-04-18: 1000 mL

## 2024-04-18 MED ORDER — SODIUM CHLORIDE 0.9% FLUSH: 3.0000 mL | INTRAVENOUS | Status: DC | PRN

## 2024-04-18 MED ORDER — FENTANYL CITRATE (PF) 100 MCG/2ML IJ SOLN
25.0000 ug | INTRAMUSCULAR | Status: DC | PRN
  Administered 2024-04-18 (×2): 50 ug via INTRAVENOUS

## 2024-04-18 MED ORDER — SODIUM CHLORIDE 0.9 % IV SOLN
250.0000 mL | INTRAVENOUS | Status: DC
  Administered 2024-04-18: 250 mL via INTRAVENOUS

## 2024-04-18 MED ORDER — EMPAGLIFLOZIN 10 MG PO TABS
10.0000 mg | ORAL_TABLET | Freq: Every day | ORAL | Status: DC
  Administered 2024-04-19: 10 mg via ORAL
  Filled 2024-04-18: qty 1

## 2024-04-18 MED ORDER — ORAL CARE MOUTH RINSE: 15.0000 mL | Freq: Once | OROMUCOSAL | Status: AC

## 2024-04-18 MED ORDER — PHENOL 1.4 % MT LIQD: 1.0000 | OROMUCOSAL | Status: DC | PRN

## 2024-04-18 MED ORDER — FENTANYL CITRATE (PF) 100 MCG/2ML IJ SOLN
INTRAMUSCULAR | Status: AC
  Filled 2024-04-18: qty 2

## 2024-04-18 MED ORDER — ACETAMINOPHEN 650 MG RE SUPP: 650.0000 mg | RECTAL | Status: DC | PRN

## 2024-04-18 MED ORDER — AMLODIPINE-OLMESARTAN 5-40 MG PO TABS: 1.0000 | ORAL_TABLET | Freq: Every day | ORAL | Status: DC | PRN

## 2024-04-18 MED ORDER — CHLORHEXIDINE GLUCONATE CLOTH 2 % EX PADS: 6.0000 | MEDICATED_PAD | Freq: Once | CUTANEOUS | Status: DC

## 2024-04-18 MED ORDER — DEXAMETHASONE SOD PHOSPHATE PF 10 MG/ML IJ SOLN
INTRAMUSCULAR | Status: DC | PRN
  Administered 2024-04-18: 10 mg via INTRAVENOUS

## 2024-04-18 MED ORDER — ONDANSETRON HCL 4 MG PO TABS: 4.0000 mg | ORAL_TABLET | Freq: Four times a day (QID) | ORAL | Status: DC | PRN

## 2024-04-18 MED ORDER — HYDROMORPHONE HCL 1 MG/ML IJ SOLN: 1.0000 mg | INTRAMUSCULAR | Status: DC | PRN

## 2024-04-18 MED ADMIN — henylephrine-NaCl Pref Syr 0.8 MG/10ML-0.9% (80 MCG/ML): 80 ug | INTRAVENOUS | NDC 65302050510

## 2024-04-18 MED ADMIN — henylephrine-NaCl Pref Syr 0.8 MG/10ML-0.9% (80 MCG/ML): 160 ug | INTRAVENOUS | NDC 65302050510

## 2024-04-18 SURGICAL SUPPLY — 45 items
BAG COUNTER SPONGE SURGICOUNT (BAG) ×1 IMPLANT
BAND RUBBER #18 3X1/16 STRL (MISCELLANEOUS) ×2 IMPLANT
BENZOIN TINCTURE PRP APPL 2/3 (GAUZE/BANDAGES/DRESSINGS) ×1 IMPLANT
BIT DRILL 13 (BIT) IMPLANT
BUR MATCHSTICK NEURO 3.0 LAGG (BURR) ×1 IMPLANT
CANISTER SUCTION 3000ML PPV (SUCTIONS) ×1 IMPLANT
DRAPE C-ARM 42X72 X-RAY (DRAPES) ×2 IMPLANT
DRAPE LAPAROTOMY 100X72 PEDS (DRAPES) ×1 IMPLANT
DRAPE MICROSCOPE SLANT 54X150 (MISCELLANEOUS) ×1 IMPLANT
DURAPREP 6ML APPLICATOR 50/CS (WOUND CARE) ×1 IMPLANT
ELECT COATED BLADE 2.86 ST (ELECTRODE) ×1 IMPLANT
ELECTRODE REM PT RTRN 9FT ADLT (ELECTROSURGICAL) ×1 IMPLANT
GAUZE 4X4 16PLY ~~LOC~~+RFID DBL (SPONGE) IMPLANT
GAUZE SPONGE 4X4 12PLY STRL (GAUZE/BANDAGES/DRESSINGS) ×1 IMPLANT
GLOVE ECLIPSE 9.0 STRL (GLOVE) ×1 IMPLANT
GOWN STRL REUS W/ TWL LRG LVL3 (GOWN DISPOSABLE) IMPLANT
GOWN STRL REUS W/ TWL XL LVL3 (GOWN DISPOSABLE) IMPLANT
GOWN STRL REUS W/TWL 2XL LVL3 (GOWN DISPOSABLE) IMPLANT
HALTER HD/CHIN CERV TRACTION D (MISCELLANEOUS) ×1 IMPLANT
HEMOSTAT POWDER KIT SURGIFOAM (HEMOSTASIS) ×1 IMPLANT
KIT BASIN OR (CUSTOM PROCEDURE TRAY) ×1 IMPLANT
KIT TURNOVER KIT B (KITS) ×1 IMPLANT
NDL SPNL 20GX3.5 QUINCKE YW (NEEDLE) ×1 IMPLANT
NEEDLE SPNL 20GX3.5 QUINCKE YW (NEEDLE) ×1 IMPLANT
PACK LAMINECTOMY NEURO (CUSTOM PROCEDURE TRAY) ×1 IMPLANT
PAD ARMBOARD POSITIONER FOAM (MISCELLANEOUS) ×3 IMPLANT
PENCIL BUTTON HOLSTER BLD 10FT (ELECTRODE) ×1 IMPLANT
PLATE 3 60XNS SPNE CVD ANT T (Plate) IMPLANT
SCREW ST FIX 4 ATL 3120213 (Screw) IMPLANT
SOLN 0.9% NACL 1000 ML (IV SOLUTION) ×1 IMPLANT
SOLN 0.9% NACL POUR BTL 1000ML (IV SOLUTION) ×1 IMPLANT
SOLN STERILE WATER 1000 ML (IV SOLUTION) ×1 IMPLANT
SOLN STERILE WATER BTL 1000 ML (IV SOLUTION) ×1 IMPLANT
SPACER SPNL 11X14X6XPEEK CVD (Cage) IMPLANT
SPACER SPNL 11X14X7XPEEK CVD (Cage) IMPLANT
SPONGE INTESTINAL PEANUT (DISPOSABLE) ×1 IMPLANT
SPONGE SURGIFOAM ABS GEL 100 (HEMOSTASIS) ×1 IMPLANT
STRIP CLOSURE SKIN 1/2X4 (GAUZE/BANDAGES/DRESSINGS) ×1 IMPLANT
SUT VIC AB 3-0 SH 8-18 (SUTURE) ×1 IMPLANT
SUT VIC AB 4-0 RB1 18 (SUTURE) ×1 IMPLANT
TAPE CLOTH 4X10 WHT NS (GAUZE/BANDAGES/DRESSINGS) ×1 IMPLANT
TAPE CLOTH SURG 4X10 WHT LF (GAUZE/BANDAGES/DRESSINGS) IMPLANT
TOWEL GREEN STERILE (TOWEL DISPOSABLE) ×1 IMPLANT
TOWEL GREEN STERILE FF (TOWEL DISPOSABLE) ×1 IMPLANT
TRAP SPECIMEN MUCUS 40CC (MISCELLANEOUS) ×1 IMPLANT

## 2024-04-18 NOTE — Brief Op Note (Signed)
 04/18/2024  5:32 PM  PATIENT:  Wayne Schmidt  68 y.o. male  PRE-OPERATIVE DIAGNOSIS:  Cervical spinal stenosis  POST-OPERATIVE DIAGNOSIS:  Cervical spinal stenosis  PROCEDURE:  Procedure(s): ANTERIOR CERVICAL DECOMPRESSION/DISCECTOMY FUSION CERVICAL THREE-SIX (N/A)  SURGEON:  Surgeons and Role:    DEWAINE Louis Shove, MD - Primary  PHYSICIAN ASSISTANT:   ASSISTANTSBETHA Jennetta PIETY   ANESTHESIA:   general  EBL:  150 mL   BLOOD ADMINISTERED:none  DRAINS: none   LOCAL MEDICATIONS USED:  NONE  SPECIMEN:  No Specimen  DISPOSITION OF SPECIMEN:  N/A  COUNTS:  YES  TOURNIQUET:  * No tourniquets in log *  DICTATION: .Dragon Dictation  PLAN OF CARE: Admit to inpatient   PATIENT DISPOSITION:  PACU - hemodynamically stable.   Delay start of Pharmacological VTE agent (>24hrs) due to surgical blood loss or risk of bleeding: yes

## 2024-04-18 NOTE — Progress Notes (Signed)
 Orthopedic Tech Progress Note Patient Details:  Wayne Schmidt [birth year] [id number]  Ortho Devices Type of Ortho Device: Soft collar Ortho Device/Splint Location: Delivered to PACU Ortho Device/Splint Interventions: Rosa Adine MARLA Tanda 04/18/2024, 5:59 PM

## 2024-04-18 NOTE — H&P (Signed)
 Wayne Schmidt is an 68 y.o. male.   Chief Complaint: Numbness HPI: 68 year old male with neck pain and bilateral upper extremity numbness paresthesias and some weakness.  Workup demonstrates evidence of marked cervical disc degeneration with severe cervical stenosis predominantly at C3-4 with cord signal change but also with severe stenosis at C4-5 and at C5-6 also with cord signal change.  Patient presents now for three-level anterior cervical decompression and fusion in hopes of improving his situation.  Past Medical History:  Diagnosis Date   Blood creatinine increased compared with prior measurement 07/29/2019   Coronary artery disease    Per CT Coronary in 2022   Diabetes mellitus without complication (HCC)    Enlarged prostate    High risk sexual behavior 10/2007   Hypertension    Microcytosis    Schizophrenia (HCC)    Substance abuse (HCC)    tobacco use   UTI (urinary tract infection) 08/24/2016   Admitted 08/23/16-08/26/16 with several day history of UTI symptoms + positive UA. Culture resulted pan-sensitive E.coli. Was treated with IV Ceftriaxone  and d/c home with Bactrim  to complete 10 day course.     Past Surgical History:  Procedure Laterality Date   ABLATION, PROSTATE, TRANSURETHRAL, USING WATERJET  2024   ARTHROSCOPY WITH ANTERIOR CRUCIATE LIGAMENT (ACL) REPAIR WITH ANTERIOR TIBILIAS GRAFT Right    CIRCUMCISION     LAMINECTOMY     Cervical Spine by Dr Drury    Family History  Problem Relation Age of Onset   Diabetes Mother    Hypertension Mother    Lupus Sister    Sickle cell anemia Brother    Drug abuse Brother    Social History:  reports that he has been smoking cigarettes. He has a 38 pack-year smoking history. He has never used smokeless tobacco. He reports that he does not currently use alcohol. He reports that he does not use drugs.  Allergies:  Allergies  Allergen Reactions   Ace Inhibitors Cough   Atorvastatin Other (See Comments)    Dizzy / Faint    Chlorpromazine Hcl Other (See Comments)    Passed out    Metformin  And Related Diarrhea    Dizziness    Simvastatin Other (See Comments)    Dizzy / Faint    Facility-Administered Medications Prior to Admission  Medication Dose Route Frequency Provider Last Rate Last Admin   fluPHENAZine  decanoate (PROLIXIN ) injection 25 mg  25 mg Intramuscular Q14 Days Arfeen, Syed T, MD   25 mg at 03/13/24 1110   Medications Prior to Admission  Medication Sig Dispense Refill   acetaminophen  (TYLENOL ) 500 MG tablet Take 500-1,000 mg by mouth every 6 (six) hours as needed (pain.).     amLODipine -olmesartan  (AZOR ) 5-40 MG tablet Take 1 tablet by mouth daily. (Patient taking differently: Take 1 tablet by mouth daily as needed (elevated blood pressure.).) 90 tablet 1   empagliflozin  (JARDIANCE ) 10 MG TABS tablet Take 1 tablet (10 mg total) by mouth daily before breakfast. (Patient taking differently: Take 10 mg by mouth daily as needed (elevated blood sugar).) 30 tablet 11   rosuvastatin  (CRESTOR ) 20 MG tablet TAKE 1 TABLET BY MOUTH ONCE DAILY (Patient taking differently: Take 20 mg by mouth once a week. TAKE 1 TABLET BY MOUTH ONCE DAILY) 90 tablet 3   fluPHENAZine  decanoate (PROLIXIN ) 25 MG/ML injection Inject 1 mL (25 mg total) into the muscle every 14 (fourteen) days. 5 mL 0    Results for orders placed or performed during the hospital encounter of  04/18/24 (from the past 48 hours)  Glucose, capillary     Status: Abnormal   Collection Time: 04/18/24 10:59 AM  Result Value Ref Range   Glucose-Capillary 139 (H) 70 - 99 mg/dL    Comment: Glucose reference range applies only to samples taken after fasting for at least 8 hours.   Comment 1 Notify RN    Comment 2 Document in Chart   Glucose, capillary     Status: Abnormal   Collection Time: 04/18/24 12:53 PM  Result Value Ref Range   Glucose-Capillary 127 (H) 70 - 99 mg/dL    Comment: Glucose reference range applies only to samples taken after fasting  for at least 8 hours.   No results found.  Pertinent items noted in HPI and remainder of comprehensive ROS otherwise negative.  Blood pressure (!) 158/94, pulse 86, temperature 98 F (36.7 C), temperature source Oral, resp. rate 19, height 6' 0.5 (1.842 m), weight 94.8 kg, SpO2 100%.  Patient is awake and alert.  He is oriented and appropriate.  Speech is fluent.  Judgment and insight are intact.  Cranial nerve function normal bilateral.  Motor examination reveals some mild weakness of grip and intrinsic strength bilaterally.  Sensory examination with patchy distal sensory loss in both upper extremities.  Lower extremity function reasonably normal.  Reflexes are brisk but symmetric.  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  Examination head ears eyes nose and throat is unremarkable chest and abdomen are benign.  Extremities are free of major deformity. Assessment/Plan Cervical stenosis with myelopathy.  Plan C3-4, C4-5, C5-6 anterior cervical discectomy with interbody fusion utilizing interbody cages, local harvested autograft, and anterior plate instrumentation.  Risks and benefits been explained.  Patient wishes to proceed.  Wayne Schmidt A Wayne Schmidt 04/18/2024, 1:52 PM

## 2024-04-18 NOTE — Transfer of Care (Signed)
 Immediate Anesthesia Transfer of Care Note  Patient: Wayne Schmidt  Procedure(s) Performed: ANTERIOR CERVICAL DECOMPRESSION/DISCECTOMY FUSION CERVICAL THREE-SIX (Spine Cervical)  Patient Location: PACU  Anesthesia Type:General  Level of Consciousness: drowsy, patient cooperative, and responds to stimulation  Airway & Oxygen Therapy: Patient Spontanous Breathing  Post-op Assessment: Report given to RN and Post -op Vital signs reviewed and stable  Post vital signs: Reviewed and stable  Last Vitals:  Vitals Value Taken Time  BP 125/77 04/18/24 17:45  Temp 36.6 C 04/18/24 17:43  Pulse 79 04/18/24 17:49  Resp 9 04/18/24 17:49  SpO2 92 % 04/18/24 17:49  Vitals shown include unfiled device data.  Last Pain:  Vitals:   04/18/24 1743  TempSrc:   PainSc: 4          Complications: No notable events documented.

## 2024-04-18 NOTE — Anesthesia Procedure Notes (Signed)
 Procedure Name: Intubation Date/Time: 04/18/2024 3:10 PM  Performed by: Marva Lonni PARAS, CRNAPre-anesthesia Checklist: Patient identified, Emergency Drugs available, Suction available and Patient being monitored Patient Re-evaluated:Patient Re-evaluated prior to induction Oxygen Delivery Method: Circle System Utilized Preoxygenation: Pre-oxygenation with 100% oxygen Induction Type: IV induction Ventilation: Mask ventilation without difficulty Laryngoscope Size: Glidescope and 4 Grade View: Grade I Tube type: Oral Tube size: 7.5 mm Number of attempts: 1 Airway Equipment and Method: Rigid stylet and Video-laryngoscopy Placement Confirmation: ETT inserted through vocal cords under direct vision, positive ETCO2 and breath sounds checked- equal and bilateral Secured at: 23 cm Tube secured with: Tape Dental Injury: Teeth and Oropharynx as per pre-operative assessment

## 2024-04-18 NOTE — Anesthesia Preprocedure Evaluation (Signed)
 Anesthesia Evaluation  Patient identified by MRN, date of birth, ID band Patient awake    Reviewed: Allergy & Precautions, NPO status , Patient's Chart, lab work & pertinent test results  Airway Mallampati: III  TM Distance: >3 FB Neck ROM: Full    Dental  (+) Missing   Pulmonary Current Smoker and Patient abstained from smoking.   Pulmonary exam normal        Cardiovascular hypertension, Pt. on medications + CAD   Rhythm:Regular Rate:Normal     Neuro/Psych  Neuromuscular disease    GI/Hepatic negative GI ROS, Neg liver ROS,,,  Endo/Other  diabetes, Type 2    Renal/GU negative Renal ROS     Musculoskeletal   Abdominal   Peds  Hematology  (+) REFUSES BLOOD PRODUCTS  Anesthesia Other Findings   Reproductive/Obstetrics                              Anesthesia Physical Anesthesia Plan  ASA: 2  Anesthesia Plan: General   Post-op Pain Management: Tylenol  PO (pre-op)* and Precedex   Induction: Intravenous  PONV Risk Score and Plan: 1 and Dexamethasone , Ondansetron  and Treatment may vary due to age or medical condition  Airway Management Planned: Oral ETT and Video Laryngoscope Planned  Additional Equipment:   Intra-op Plan:   Post-operative Plan: Extubation in OR  Informed Consent: I have reviewed the patients History and Physical, chart, labs and discussed the procedure including the risks, benefits and alternatives for the proposed anesthesia with the patient or authorized representative who has indicated his/her understanding and acceptance.     Dental advisory given  Plan Discussed with: CRNA  Anesthesia Plan Comments:         Anesthesia Quick Evaluation

## 2024-04-18 NOTE — Op Note (Signed)
 Date of procedure: 04/18/2024  Date of dictation: Same  Service: Neurosurgery  Preoperative diagnosis: Cervical stenosis with myelopathy  Postoperative diagnosis: Same  Procedure Name: C3-4, C4-5, C5-6 anterior cervical discectomy with interbody fusion utilizing interbody cages, locally harvested autograft, and anterior plate instrumentation  Surgeon:Demaris Bousquet A.Danice Dippolito, M.D.  Asst. Surgeon: Jennetta, NP  Assistant utilized for exposure, decompression, fusion and instrumentation and closure  Anesthesia: General  Indication: 68 year old male with recently discovered severe cervical spondylosis with stenosis with ongoing cord compression with cord signal change at C3-4 and C4-5 and C5-6.  Patient presents now for three-level anterior cervical decompression and fusion.  Operative note: After induction of anesthesia, patient patient's supine with neck slightly extended and held in place with halter traction.  Patient's anterior cervical region prepped and draped sterilely.  Incision made overlying C4-5.  Dissection performed on the right.  Retractor placed.  Fluoroscopy used.  Levels confirmed.  Disc bases at C3-4, C4-5 and C5-6 were incised.  Anterior osteophytes were resected.  Discectomies were performed at all levels utilizing various instruments to remove all elements of the disc down to the level of the posterior annulus.  Remaining aspects of annulus and osteophytes were removed using high-speed drill down to the level of the posterior longitudinal ligament.  Posterior ligament was then elevated and resected in a piecemeal fashion.  Underlying thecal sac was identified.  A wide central decompression was then performed undercutting the bodies of C3 and C4.  Decompression then proceeded into each neural foramina.  Wide anterior foraminotomies performed on the course exiting C4 nerve roots bilaterally.  At this point a very thorough decompression had been achieved.  There was no evidence of injury to  the thecal sac or nerve roots.  Procedure was then repeated at C4-5 and C5-6 in a similar fashion again without complications.  Wound was irrigated.  Hemostasis was assured.  [employer] anatomic peek cages were packed with locally harvested autograft.  Cages were then impacted in place at all 3 levels.  Each cage was recessed slightly from the anterior cortical margin.  Atlantis anterior cervical plate was then placed over the C3, C4, C5 and C6 levels.  This then attached under fluoroscopic guidance and 13 mm fixed angle screws to each at all 4 levels.  All screws given the final tightening found to be solidly within the bone.  Locking screws engaged at all levels.  Final images reveal good position of the cages and the hardware at the proper level with normal alignment of the spine.  Wound was irrigated.  Hemostasis was once again assured.  Wound was then closed in layers with Vicryl sutures.  Steri-Strips and sterile dressing were applied.  No apparent complications.  Patient tolerated the procedure well and he returns to the recovery room postop.

## 2024-04-19 ENCOUNTER — Other Ambulatory Visit: Payer: Self-pay

## 2024-04-19 ENCOUNTER — Encounter (HOSPITAL_COMMUNITY): Payer: Self-pay | Admitting: Neurosurgery

## 2024-04-19 MED ORDER — CYCLOBENZAPRINE HCL 5 MG PO TABS: 5.0000 mg | ORAL_TABLET | Freq: Three times a day (TID) | ORAL | 0 refills | Status: AC | PRN

## 2024-04-19 MED ORDER — CYCLOBENZAPRINE HCL 5 MG PO TABS
5.0000 mg | ORAL_TABLET | Freq: Three times a day (TID) | ORAL | Status: DC | PRN
  Administered 2024-04-19: 5 mg via ORAL
  Filled 2024-04-19: qty 1

## 2024-04-19 MED ORDER — HYDROCODONE-ACETAMINOPHEN 10-325 MG PO TABS: 1.0000 | ORAL_TABLET | ORAL | 0 refills | Status: AC | PRN

## 2024-04-19 NOTE — Anesthesia Postprocedure Evaluation (Signed)
 Anesthesia Post Note  Patient: Wayne Schmidt  Procedure(s) Performed: ANTERIOR CERVICAL DECOMPRESSION/DISCECTOMY FUSION CERVICAL THREE-SIX (Spine Cervical)     Patient location during evaluation: PACU Anesthesia Type: General Level of consciousness: awake Pain management: pain level controlled Vital Signs Assessment: post-procedure vital signs reviewed and stable Respiratory status: spontaneous breathing, nonlabored ventilation and respiratory function stable Cardiovascular status: blood pressure returned to baseline and stable Postop Assessment: no apparent nausea or vomiting Anesthetic complications: no   No notable events documented.                  Delon Aisha Arch

## 2024-04-19 NOTE — Evaluation (Signed)
 Occupational Therapy Evaluation Patient Details Name: Wayne Schmidt MRN: [id number] DOB: [birth year] Today's Date: 04/19/2024   History of Present Illness   Wayne Schmidt  68 y.o. male who is s/p C3-4, C4-5, C5-6 anterior cervical discectomy with interbody fusion 10/17. PMHx: CAD, DM, HTN, microcytosis, schizophrenia     Clinical Impressions [name] was evaluated s/p the above spine surgery. He lives alone and is indep at baseline. Upon evaluation pt was limited by neck soreness, knowledge of cervical precautions, compensatory techniques and decreased activity tolerance. Overall he needed cues to maintain precautions but otherwise did not need assist for ADLs or mobility. Pt declined AD. Provided cues and education on spinal precautions and compensatory techniques throughout, handout provided and pt demonstrated limited recall during ADLs and mobility. Pt does not require further acute OT services. Recommend d/c home with support of family.       If plan is discharge home, recommend the following:   Assist for transportation;Assistance with cooking/housework;Supervision due to cognitive status     Functional Status Assessment   Patient has had a recent decline in their functional status and demonstrates the ability to make significant improvements in function in a reasonable and predictable amount of time.     Equipment Recommendations   Other (comment) (pt declined AD)      Precautions/Restrictions   Precautions Precautions: Fall;Cervical Precaution Booklet Issued: Yes (comment) Recall of Precautions/Restrictions: Impaired (required cues) Required Braces or Orthoses: Cervical Brace Cervical Brace: Soft collar;At all times Restrictions Weight Bearing Restrictions Per Provider Order: No     Mobility Bed Mobility Overal bed mobility: Needs Assistance             General bed mobility comments: reviewed log roll, pt sitting EOB on arrival     Transfers Overall transfer level: Needs assistance Equipment used: None (pt decined RW use) Transfers: Sit to/from Stand Sit to Stand: Supervision           General transfer comment: supervision for safety, pt declined AD use      Balance Overall balance assessment: Needs assistance Sitting-balance support: Feet supported Sitting balance-Leahy Scale: Good     Standing balance support: No upper extremity supported, During functional activity Standing balance-Leahy Scale: Fair                             ADL either performed or assessed with clinical judgement   ADL Overall ADL's : Needs assistance/impaired Eating/Feeding: Independent   Grooming: Supervision/safety   Upper Body Bathing: Modified independent   Lower Body Bathing: Contact guard assist   Upper Body Dressing : Modified independent   Lower Body Dressing: Contact guard assist   Toilet Transfer: Supervision/safety   Toileting- Clothing Manipulation and Hygiene: Modified independent       Functional mobility during ADLs: Contact guard assist General ADL Comments: cues needed to maintain cervial precautions     Vision Baseline Vision/History: 0 No visual deficits Vision Assessment?: No apparent visual deficits     Perception Perception: Within Functional Limits       Praxis Praxis: WFL       Pertinent Vitals/Pain Pain Assessment Pain Assessment: Faces Faces Pain Scale: Hurts a little bit Pain Location: neck Pain Descriptors / Indicators: Sore Pain Intervention(s): Limited activity within patient's tolerance, Monitored during session     Extremity/Trunk Assessment Upper Extremity Assessment Upper Extremity Assessment: Generalized weakness   Lower Extremity Assessment Lower Extremity Assessment: Overall WFL for tasks assessed   Cervical /  Trunk Assessment Cervical / Trunk Assessment: Neck Surgery   Communication Communication Communication: No apparent difficulties    Cognition Arousal: Alert Behavior During Therapy: WFL for tasks assessed/performed Cognition: No family/caregiver present to determine baseline             OT - Cognition Comments: limited awareness and carryover of cervical precautions but overall WFL for ADLs and command following                 Following commands: Intact       Cueing  General Comments   Cueing Techniques: Verbal cues  VSS on RA    Home Living Family/patient expects to be discharged to:: Private residence Living Arrangements: Alone Available Help at Discharge: Family;Available 24 hours/day (dtr coming to assist) Type of Home: Apartment (3rd floor) Home Access: Elevator;Level entry     Home Layout: One level     Bathroom Shower/Tub: Producer, television/film/video: Standard     Home Equipment: Shower seat          Prior Functioning/Environment Prior Level of Function : Independent/Modified Independent;Driving             Mobility Comments: no AD ADLs Comments: indep, drives, shops    OT Problem List: Decreased activity tolerance;Decreased knowledge of precautions;Decreased safety awareness   OT Treatment/Interventions:        OT Goals(Current goals can be found in the care plan section)   Acute Rehab OT Goals Patient Stated Goal: home OT Goal Formulation: With patient Time For Goal Achievement: 04/19/24 Potential to Achieve Goals: Good   AM-PAC OT 6 Clicks Daily Activity     Outcome Measure Help from another person eating meals?: None Help from another person taking care of personal grooming?: A Little Help from another person toileting, which includes using toliet, bedpan, or urinal?: A Little Help from another person bathing (including washing, rinsing, drying)?: A Little Help from another person to put on and taking off regular upper body clothing?: A Little Help from another person to put on and taking off regular lower body clothing?: A Little 6 Click  Score: 19   End of Session Nurse Communication: Mobility status  Activity Tolerance: Patient tolerated treatment well Patient left: in chair;with call bell/phone within reach  OT Visit Diagnosis: Unsteadiness on feet (R26.81);Other abnormalities of gait and mobility (R26.89);Muscle weakness (generalized) (M62.81)                Time: 9249-9190 OT Time Calculation (min): 19 min Charges:  OT General Charges $OT Visit: 1 Visit OT Evaluation $OT Eval Moderate Complexity: 1 Mod  Lucie Kendall, OTR/L Acute Rehabilitation Services Office [phone] Secure Chat Communication Preferred   Lucie JONETTA Kendall 04/19/2024, 8:20 AM

## 2024-04-19 NOTE — Progress Notes (Signed)
 Orthopedic Tech Progress Note Patient Details:  Wayne Schmidt 10/23/1955 [id number]  Patient ID: Wayne Schmidt, male   DOB: 01/27/1956, 68 y.o.   MRN: [id number] Contacted nurse he already has one from PACU Wayne Schmidt Wayne Schmidt 04/19/2024, 2:39 AM

## 2024-04-19 NOTE — Discharge Summary (Signed)
 Physician Discharge Summary  Patient ID: Wayne Schmidt MRN: [id number] DOB/AGE: 1955/11/01 68 y.o.  Admit date: 04/18/2024 Discharge date: 04/19/2024  Admission Diagnoses: Cervical spondylosis with myelopathy and radiculopathy  Discharge Diagnoses:  Principal Problem:   Cervical spondylosis with myelopathy and radiculopathy   Discharged Condition: good  Hospital Course: Dr. Louis performed a C3-4, C4-5 and C5-6 anterior cervicectomy fusion and plating on the patient on 04/18/2024.  The patient's postoperative course was unremarkable.  On postoperative day 1 the patient requested discharge to home.  He tells me that his daughter is going to stay with him initially.  The patient was given verbal and written discharge instructions.  All his questions were answered.  Consults: OT Significant Diagnostic Studies: None Treatments: C3-4, C4-5 and C5-6 anterior cervicectomy fusion and plating. Discharge Exam: Blood pressure (!) 140/86, pulse 99, temperature (!) 97.5 F (36.4 C), temperature source Oral, resp. rate 20, height 6' 0.5 (1.842 m), weight 94.8 kg, SpO2 95%. The patient is alert and pleasant.  His dressing has a small blood stain.  There is no hematoma or shift.  He is moving all 4 extremities well.  Disposition: Home  Discharge Instructions     Call MD for:  difficulty breathing, headache or visual disturbances   Complete by: As directed    Call MD for:  extreme fatigue   Complete by: As directed    Call MD for:  hives   Complete by: As directed    Call MD for:  persistant dizziness or light-headedness   Complete by: As directed    Call MD for:  persistant nausea and vomiting   Complete by: As directed    Call MD for:  redness, tenderness, or signs of infection (pain, swelling, redness, odor or green/yellow discharge around incision site)   Complete by: As directed    Call MD for:  severe uncontrolled pain   Complete by: As directed    Call MD for:  temperature >100.4    Complete by: As directed    Diet - low sodium heart healthy   Complete by: As directed    Discharge instructions   Complete by: As directed    Call [phone] for a followup appointment. Take a stool softener while you are using pain medications.   Driving Restrictions   Complete by: As directed    Do not drive for 2 weeks.   Increase activity slowly   Complete by: As directed    Lifting restrictions   Complete by: As directed    Do not lift more than 5 pounds. No excessive bending or twisting.   May shower / Bathe   Complete by: As directed    Remove the dressing for 3 days after surgery.  You may shower, but leave the incision alone.   Remove dressing in 48 hours   Complete by: As directed       Allergies as of 04/19/2024       Reactions   Ace Inhibitors Cough   Atorvastatin Other (See Comments)   Dizzy / Faint   Chlorpromazine Hcl Other (See Comments)   Passed out    Metformin  And Related Diarrhea   Dizziness    Simvastatin Other (See Comments)   Dizzy / Faint        Medication List     STOP taking these medications    fluPHENAZine  decanoate 25 MG/ML injection Commonly known as: PROLIXIN        TAKE these medications    acetaminophen  500 MG  tablet Commonly known as: TYLENOL  Take 500-1,000 mg by mouth every 6 (six) hours as needed (pain.).   amLODipine -olmesartan  5-40 MG tablet Commonly known as: AZOR  Take 1 tablet by mouth daily. What changed:  when to take this reasons to take this   cyclobenzaprine 5 MG tablet Commonly known as: FLEXERIL Take 1 tablet (5 mg total) by mouth 3 (three) times daily as needed for muscle spasms.   empagliflozin  10 MG Tabs tablet Commonly known as: Jardiance  Take 1 tablet (10 mg total) by mouth daily before breakfast. What changed:  when to take this reasons to take this   HYDROcodone -acetaminophen  10-325 MG tablet Commonly known as: NORCO Take 1 tablet by mouth every 4 (four) hours as needed for severe  pain (pain score 7-10) ((score 7 to 10)).   rosuvastatin  20 MG tablet Commonly known as: CRESTOR  TAKE 1 TABLET BY MOUTH ONCE DAILY What changed:  how much to take how to take this when to take this         Signed: Reyes JONETTA Budge 04/19/2024, 10:06 AM

## 2024-04-19 NOTE — Plan of Care (Signed)

## 2024-04-21 ENCOUNTER — Telehealth: Payer: Self-pay

## 2024-04-21 ENCOUNTER — Encounter (HOSPITAL_COMMUNITY): Payer: Self-pay | Admitting: Neurosurgery

## 2024-04-21 NOTE — Transitions of Care (Post Inpatient/ED Visit) (Signed)
   04/21/2024  Name: Chay Mazzoni MRN: [id number] DOB: 1955-09-18  Today's TOC FU Call Status: Today's TOC FU Call Status:: Unsuccessful Call (1st Attempt) Unsuccessful Call (1st Attempt) Date: 04/21/24  Attempted to reach the patient regarding the most recent Inpatient/ED visit.  Follow Up Plan: Additional outreach attempts will be made to reach the patient to complete the Transitions of Care (Post Inpatient/ED visit) call.   Medford Balboa, BSN, RN [place]  VBCI - [employer] [facility] RN Care Manager [phone]

## 2024-04-21 NOTE — Transitions of Care (Post Inpatient/ED Visit) (Signed)
   04/21/2024  Name: Wayne Schmidt MRN: [id number] DOB: 06-01-1956  Today's TOC FU Call Status:   Patient's Name and Date of Birth confirmed.  Transition Care Management Follow-up Telephone Call How have you been since you were released from the hospital?: Better Any questions or concerns?: No  Items Reviewed: Did you receive and understand the discharge instructions provided?: Yes Medications obtained,verified, and reconciled?: Yes (Medications Reviewed) Any new allergies since your discharge?: No Dietary orders reviewed?: NA Do you have support at home?: Yes People in Home [RPT]: alone Name of Support/Comfort Primary Source: Wayne Schmidt  Medications Reviewed Today: Medications Reviewed Today     Reviewed by Moises Reusing, RN (Case Manager) on 04/21/24 at 1157  Med List Status: <None>   Medication Order Taking? Sig Documenting Provider Last Dose Status Informant  acetaminophen  (TYLENOL ) 500 MG tablet [id number] No Take 500-1,000 mg by mouth every 6 (six) hours as needed (pain.). [provider] Past Week Active Self  amLODipine -olmesartan  (AZOR ) 5-40 MG tablet [id number] No Take 1 tablet by mouth daily.  Patient taking differently: Take 1 tablet by mouth daily as needed (elevated blood pressure.).   Rosan Dayton BROCKS, DO Past Week Active Self  cyclobenzaprine (FLEXERIL) 5 MG tablet [id number]  Take 1 tablet (5 mg total) by mouth 3 (three) times daily as needed for muscle spasms. Mavis Purchase, MD  Active   empagliflozin  (JARDIANCE ) 10 MG TABS tablet [id number] No Take 1 tablet (10 mg total) by mouth daily before breakfast.  Patient taking differently: Take 10 mg by mouth daily as needed (elevated blood sugar).   Rosan Dayton BROCKS, DO Past Week Active Self  fluPHENAZine  decanoate (PROLIXIN ) injection 25 mg [id number]   Curry Leni DASEN, MD  Active   HYDROcodone -acetaminophen  [facility]) 10-325 MG tablet [id number]  Take 1 tablet by mouth every 4 (four) hours as needed for  severe pain (pain score 7-10) ((score 7 to 10)). Mavis Purchase, MD  Active   rosuvastatin  (CRESTOR ) 20 MG tablet [id number] No TAKE 1 TABLET BY MOUTH ONCE DAILY  Patient taking differently: Take 20 mg by mouth once a week. TAKE 1 TABLET BY MOUTH ONCE DAILY   Rosan Dayton BROCKS, DO 04/18/2024  7:45 AM Active Self            Home Care and Equipment/Supplies: Were Home Health Services Ordered?: NA Any new equipment or medical supplies ordered?: NA  Functional Questionnaire: Do you need assistance with bathing/showering or dressing?: No Do you need assistance with meal preparation?: No Do you need assistance with eating?: No Do you have difficulty maintaining continence: No Do you need assistance with getting out of bed/getting out of a chair/moving?: No Do you have difficulty managing or taking your medications?: No  Follow up appointments reviewed: PCP Follow-up appointment confirmed?: No (Message sent to [facility] clinical pool) MD Provider Line [phone] Given: No Specialist Hospital Follow-up appointment confirmed?: No Reason Specialist Follow-Up Not Confirmed: Patient has Specialist Provider Number and will Call for Appointment Do you need transportation to your follow-up appointment?: No Do you understand care options if your condition(s) worsen?: Yes-patient verbalized understanding  SDOH Interventions Today    Flowsheet Row Most Recent Value  SDOH Interventions   Food Insecurity Interventions Intervention Not Indicated  Housing Interventions Intervention Not Indicated  Transportation Interventions Intervention Not Indicated  Utilities Interventions Intervention Not Indicated    Medford Moises, BSN, RN [place]  VBCI - Population Health RN Care Manager [phone]

## 2024-05-01 ENCOUNTER — Other Ambulatory Visit: Payer: Self-pay

## 2024-05-01 MED ORDER — AMLODIPINE-OLMESARTAN 5-40 MG PO TABS: 1.0000 | ORAL_TABLET | Freq: Every day | ORAL | 1 refills | Status: AC

## 2024-05-07 ENCOUNTER — Ambulatory Visit: Admitting: Student

## 2024-07-10 ENCOUNTER — Other Ambulatory Visit: Payer: Self-pay | Admitting: *Deleted

## 2024-07-10 ENCOUNTER — Other Ambulatory Visit: Payer: Self-pay

## 2024-07-10 DIAGNOSIS — E785 Hyperlipidemia, unspecified: Secondary | ICD-10-CM

## 2024-07-10 MED ORDER — EMPAGLIFLOZIN 10 MG PO TABS: 10.0000 mg | ORAL_TABLET | Freq: Every day | ORAL | 3 refills | Status: AC

## 2024-07-10 MED ORDER — ROSUVASTATIN CALCIUM 20 MG PO TABS: ORAL_TABLET | ORAL | 3 refills | Status: AC

## 2024-09-01 ENCOUNTER — Ambulatory Visit: Payer: Self-pay | Admitting: Internal Medicine

## 2024-09-08 ENCOUNTER — Ambulatory Visit: Payer: Self-pay | Admitting: Internal Medicine
